# Patient Record
Sex: Female | Born: 1935 | Race: Black or African American | Hispanic: No | Smoking: Never smoker
Health system: Southern US, Community
[De-identification: ages and names within clinical notes are randomized; demographics above are authoritative.]

## PROBLEM LIST (undated history)

## (undated) DIAGNOSIS — I447 Left bundle-branch block, unspecified: Secondary | ICD-10-CM

## (undated) DIAGNOSIS — R0789 Other chest pain: Secondary | ICD-10-CM

## (undated) DIAGNOSIS — G7000 Myasthenia gravis without (acute) exacerbation: Secondary | ICD-10-CM

## (undated) DIAGNOSIS — IMO0001 Reserved for inherently not codable concepts without codable children: Secondary | ICD-10-CM

## (undated) DIAGNOSIS — N289 Disorder of kidney and ureter, unspecified: Secondary | ICD-10-CM

## (undated) DIAGNOSIS — M810 Age-related osteoporosis without current pathological fracture: Secondary | ICD-10-CM

## (undated) DIAGNOSIS — M25471 Effusion, right ankle: Secondary | ICD-10-CM

## (undated) DIAGNOSIS — H409 Unspecified glaucoma: Secondary | ICD-10-CM

## (undated) DIAGNOSIS — R197 Diarrhea, unspecified: Secondary | ICD-10-CM

## (undated) DIAGNOSIS — F419 Anxiety disorder, unspecified: Secondary | ICD-10-CM

## (undated) DIAGNOSIS — K5903 Drug induced constipation: Secondary | ICD-10-CM

## (undated) DIAGNOSIS — F0390 Unspecified dementia without behavioral disturbance: Secondary | ICD-10-CM

## (undated) DIAGNOSIS — I10 Essential (primary) hypertension: Secondary | ICD-10-CM

## (undated) DIAGNOSIS — M1990 Unspecified osteoarthritis, unspecified site: Secondary | ICD-10-CM

## (undated) DIAGNOSIS — I428 Other cardiomyopathies: Secondary | ICD-10-CM

## (undated) DIAGNOSIS — E079 Disorder of thyroid, unspecified: Secondary | ICD-10-CM

## (undated) DIAGNOSIS — R350 Frequency of micturition: Secondary | ICD-10-CM

## (undated) DIAGNOSIS — G459 Transient cerebral ischemic attack, unspecified: Secondary | ICD-10-CM

## (undated) DIAGNOSIS — F329 Major depressive disorder, single episode, unspecified: Secondary | ICD-10-CM

## (undated) DIAGNOSIS — R569 Unspecified convulsions: Secondary | ICD-10-CM

## (undated) DIAGNOSIS — Z5189 Encounter for other specified aftercare: Secondary | ICD-10-CM

## (undated) DIAGNOSIS — F32A Depression, unspecified: Secondary | ICD-10-CM

## (undated) DIAGNOSIS — M889 Osteitis deformans of unspecified bone: Secondary | ICD-10-CM

## (undated) DIAGNOSIS — D649 Anemia, unspecified: Secondary | ICD-10-CM

## (undated) DIAGNOSIS — K219 Gastro-esophageal reflux disease without esophagitis: Secondary | ICD-10-CM

## (undated) DIAGNOSIS — M25472 Effusion, left ankle: Secondary | ICD-10-CM

## (undated) DIAGNOSIS — F29 Unspecified psychosis not due to a substance or known physiological condition: Secondary | ICD-10-CM

## (undated) HISTORY — PX: EYE SURGERY: SHX253

## (undated) HISTORY — PX: JOINT REPLACEMENT: SHX530

## (undated) HISTORY — PX: TONSILLECTOMY: SUR1361

## (undated) HISTORY — DX: Disorder of thyroid, unspecified: E07.9

## (undated) HISTORY — PX: BREAST LUMPECTOMY: SHX2

## (undated) HISTORY — PX: FOOT SURGERY: SHX648

## (undated) HISTORY — PX: TUBAL LIGATION: SHX77

## (undated) HISTORY — DX: Myasthenia gravis without (acute) exacerbation: G70.00

## (undated) HISTORY — PX: SHOULDER SURGERY: SHX246

## (undated) HISTORY — DX: Other chest pain: R07.89

## (undated) HISTORY — PX: CARDIAC CATHETERIZATION: SHX172

## (undated) HISTORY — PX: CHOLECYSTECTOMY: SHX55

## (undated) HISTORY — DX: Age-related osteoporosis without current pathological fracture: M81.0

---

## 1961-02-14 HISTORY — PX: NEPHRECTOMY: SHX65

## 1997-05-19 ENCOUNTER — Encounter: Admission: RE | Admit: 1997-05-19 | Discharge: 1997-05-19 | Payer: Self-pay | Admitting: Family Medicine

## 1997-06-12 ENCOUNTER — Encounter: Admission: RE | Admit: 1997-06-12 | Discharge: 1997-06-12 | Payer: Self-pay | Admitting: Family Medicine

## 1997-07-09 ENCOUNTER — Encounter: Admission: RE | Admit: 1997-07-09 | Discharge: 1997-07-09 | Payer: Self-pay | Admitting: Family Medicine

## 1997-08-29 ENCOUNTER — Encounter: Admission: RE | Admit: 1997-08-29 | Discharge: 1997-08-29 | Payer: Self-pay | Admitting: Family Medicine

## 1997-09-04 ENCOUNTER — Ambulatory Visit (HOSPITAL_COMMUNITY): Admission: RE | Admit: 1997-09-04 | Discharge: 1997-09-04 | Payer: Self-pay | Admitting: Gastroenterology

## 1997-09-25 ENCOUNTER — Encounter: Admission: RE | Admit: 1997-09-25 | Discharge: 1997-09-25 | Payer: Self-pay | Admitting: Family Medicine

## 1997-10-07 ENCOUNTER — Encounter: Admission: RE | Admit: 1997-10-07 | Discharge: 1997-10-07 | Payer: Self-pay | Admitting: Sports Medicine

## 1997-10-23 ENCOUNTER — Inpatient Hospital Stay (HOSPITAL_COMMUNITY): Admission: EM | Admit: 1997-10-23 | Discharge: 1997-10-26 | Payer: Self-pay | Admitting: Emergency Medicine

## 1997-10-23 ENCOUNTER — Encounter (HOSPITAL_BASED_OUTPATIENT_CLINIC_OR_DEPARTMENT_OTHER): Payer: Self-pay | Admitting: General Surgery

## 1997-10-23 ENCOUNTER — Encounter: Payer: Self-pay | Admitting: Emergency Medicine

## 1997-10-24 ENCOUNTER — Encounter (HOSPITAL_BASED_OUTPATIENT_CLINIC_OR_DEPARTMENT_OTHER): Payer: Self-pay | Admitting: General Surgery

## 1997-10-25 ENCOUNTER — Encounter (HOSPITAL_BASED_OUTPATIENT_CLINIC_OR_DEPARTMENT_OTHER): Payer: Self-pay | Admitting: General Surgery

## 1997-11-10 ENCOUNTER — Encounter: Admission: RE | Admit: 1997-11-10 | Discharge: 1997-11-10 | Payer: Self-pay | Admitting: Family Medicine

## 1997-12-10 ENCOUNTER — Encounter: Admission: RE | Admit: 1997-12-10 | Discharge: 1997-12-10 | Payer: Self-pay | Admitting: Family Medicine

## 1998-01-12 ENCOUNTER — Encounter: Admission: RE | Admit: 1998-01-12 | Discharge: 1998-01-12 | Payer: Self-pay | Admitting: Family Medicine

## 1998-02-12 ENCOUNTER — Encounter: Admission: RE | Admit: 1998-02-12 | Discharge: 1998-02-12 | Payer: Self-pay | Admitting: Family Medicine

## 1998-02-16 ENCOUNTER — Encounter: Admission: RE | Admit: 1998-02-16 | Discharge: 1998-02-16 | Payer: Self-pay | Admitting: Family Medicine

## 1998-03-12 ENCOUNTER — Encounter: Payer: Self-pay | Admitting: Orthopedic Surgery

## 1998-03-16 ENCOUNTER — Inpatient Hospital Stay (HOSPITAL_COMMUNITY): Admission: RE | Admit: 1998-03-16 | Discharge: 1998-03-19 | Payer: Self-pay | Admitting: Orthopedic Surgery

## 1998-03-19 ENCOUNTER — Inpatient Hospital Stay (HOSPITAL_COMMUNITY)
Admission: RE | Admit: 1998-03-19 | Discharge: 1998-03-27 | Payer: Self-pay | Admitting: Physical Medicine and Rehabilitation

## 1998-03-21 ENCOUNTER — Encounter: Payer: Self-pay | Admitting: Physical Medicine and Rehabilitation

## 1998-04-03 ENCOUNTER — Encounter: Admission: RE | Admit: 1998-04-03 | Discharge: 1998-04-03 | Payer: Self-pay | Admitting: Family Medicine

## 1998-04-28 ENCOUNTER — Encounter: Admission: RE | Admit: 1998-04-28 | Discharge: 1998-05-28 | Payer: Self-pay | Admitting: Orthopedic Surgery

## 1998-05-22 ENCOUNTER — Encounter: Admission: RE | Admit: 1998-05-22 | Discharge: 1998-05-22 | Payer: Self-pay | Admitting: Family Medicine

## 1998-06-02 ENCOUNTER — Encounter: Admission: RE | Admit: 1998-06-02 | Discharge: 1998-06-02 | Payer: Self-pay | Admitting: Family Medicine

## 1998-06-12 ENCOUNTER — Encounter: Admission: RE | Admit: 1998-06-12 | Discharge: 1998-06-12 | Payer: Self-pay | Admitting: Family Medicine

## 1998-07-29 ENCOUNTER — Encounter: Admission: RE | Admit: 1998-07-29 | Discharge: 1998-07-29 | Payer: Self-pay | Admitting: Family Medicine

## 1998-07-30 ENCOUNTER — Encounter: Admission: RE | Admit: 1998-07-30 | Discharge: 1998-08-11 | Payer: Self-pay

## 1998-08-21 ENCOUNTER — Encounter: Admission: RE | Admit: 1998-08-21 | Discharge: 1998-08-21 | Payer: Self-pay | Admitting: Family Medicine

## 1998-10-09 ENCOUNTER — Encounter: Admission: RE | Admit: 1998-10-09 | Discharge: 1998-10-09 | Payer: Self-pay | Admitting: Family Medicine

## 1998-10-21 ENCOUNTER — Encounter: Admission: RE | Admit: 1998-10-21 | Discharge: 1998-10-21 | Payer: Self-pay | Admitting: Family Medicine

## 1998-10-21 ENCOUNTER — Inpatient Hospital Stay (HOSPITAL_COMMUNITY): Admission: EM | Admit: 1998-10-21 | Discharge: 1998-10-22 | Payer: Self-pay | Admitting: Emergency Medicine

## 1998-10-21 ENCOUNTER — Ambulatory Visit (HOSPITAL_COMMUNITY): Admission: RE | Admit: 1998-10-21 | Discharge: 1998-10-21 | Payer: Self-pay | Admitting: Family Medicine

## 1998-10-23 ENCOUNTER — Encounter: Admission: RE | Admit: 1998-10-23 | Discharge: 1998-10-23 | Payer: Self-pay | Admitting: Family Medicine

## 1998-11-11 ENCOUNTER — Encounter: Admission: RE | Admit: 1998-11-11 | Discharge: 1998-11-11 | Payer: Self-pay | Admitting: Family Medicine

## 1998-11-23 ENCOUNTER — Encounter: Admission: RE | Admit: 1998-11-23 | Discharge: 1998-11-23 | Payer: Self-pay | Admitting: Family Medicine

## 1998-12-11 ENCOUNTER — Encounter: Admission: RE | Admit: 1998-12-11 | Discharge: 1998-12-11 | Payer: Self-pay | Admitting: Sports Medicine

## 1999-01-06 ENCOUNTER — Encounter: Admission: RE | Admit: 1999-01-06 | Discharge: 1999-01-06 | Payer: Self-pay | Admitting: Family Medicine

## 1999-02-05 ENCOUNTER — Encounter: Admission: RE | Admit: 1999-02-05 | Discharge: 1999-02-05 | Payer: Self-pay | Admitting: Sports Medicine

## 1999-02-25 ENCOUNTER — Encounter: Admission: RE | Admit: 1999-02-25 | Discharge: 1999-02-25 | Payer: Self-pay | Admitting: Family Medicine

## 1999-03-03 ENCOUNTER — Encounter: Payer: Self-pay | Admitting: Sports Medicine

## 1999-03-03 ENCOUNTER — Encounter: Admission: RE | Admit: 1999-03-03 | Discharge: 1999-03-03 | Payer: Self-pay | Admitting: Sports Medicine

## 1999-03-11 ENCOUNTER — Encounter: Admission: RE | Admit: 1999-03-11 | Discharge: 1999-03-11 | Payer: Self-pay | Admitting: Family Medicine

## 1999-03-11 ENCOUNTER — Other Ambulatory Visit: Admission: RE | Admit: 1999-03-11 | Discharge: 1999-03-14 | Payer: Self-pay | Admitting: *Deleted

## 1999-03-30 ENCOUNTER — Encounter: Admission: RE | Admit: 1999-03-30 | Discharge: 1999-03-30 | Payer: Self-pay | Admitting: Sports Medicine

## 1999-04-13 ENCOUNTER — Encounter: Admission: RE | Admit: 1999-04-13 | Discharge: 1999-04-13 | Payer: Self-pay | Admitting: Sports Medicine

## 1999-04-19 ENCOUNTER — Encounter: Admission: RE | Admit: 1999-04-19 | Discharge: 1999-04-19 | Payer: Self-pay | Admitting: Family Medicine

## 1999-04-23 ENCOUNTER — Encounter: Admission: RE | Admit: 1999-04-23 | Discharge: 1999-07-22 | Payer: Self-pay | Admitting: Sports Medicine

## 1999-04-26 ENCOUNTER — Encounter: Admission: RE | Admit: 1999-04-26 | Discharge: 1999-04-26 | Payer: Self-pay | Admitting: Sports Medicine

## 1999-04-26 ENCOUNTER — Encounter: Payer: Self-pay | Admitting: Sports Medicine

## 1999-05-03 ENCOUNTER — Encounter: Admission: RE | Admit: 1999-05-03 | Discharge: 1999-05-03 | Payer: Self-pay | Admitting: Family Medicine

## 1999-05-11 ENCOUNTER — Encounter: Admission: RE | Admit: 1999-05-11 | Discharge: 1999-08-09 | Payer: Self-pay | Admitting: *Deleted

## 1999-06-14 ENCOUNTER — Encounter: Admission: RE | Admit: 1999-06-14 | Discharge: 1999-06-14 | Payer: Self-pay | Admitting: Family Medicine

## 1999-07-01 ENCOUNTER — Encounter: Admission: RE | Admit: 1999-07-01 | Discharge: 1999-07-01 | Payer: Self-pay | Admitting: Family Medicine

## 1999-07-01 ENCOUNTER — Encounter: Admission: RE | Admit: 1999-07-01 | Discharge: 1999-07-01 | Payer: Self-pay | Admitting: *Deleted

## 1999-07-07 ENCOUNTER — Encounter: Admission: RE | Admit: 1999-07-07 | Discharge: 1999-07-07 | Payer: Self-pay | Admitting: Family Medicine

## 1999-07-14 ENCOUNTER — Encounter: Admission: RE | Admit: 1999-07-14 | Discharge: 1999-07-14 | Payer: Self-pay | Admitting: Family Medicine

## 1999-08-13 ENCOUNTER — Encounter: Admission: RE | Admit: 1999-08-13 | Discharge: 1999-11-11 | Payer: Self-pay | Admitting: *Deleted

## 1999-08-15 ENCOUNTER — Encounter (INDEPENDENT_AMBULATORY_CARE_PROVIDER_SITE_OTHER): Payer: Self-pay | Admitting: *Deleted

## 1999-08-16 ENCOUNTER — Other Ambulatory Visit: Admission: RE | Admit: 1999-08-16 | Discharge: 1999-08-16 | Payer: Self-pay | Admitting: Sports Medicine

## 1999-08-16 ENCOUNTER — Encounter: Admission: RE | Admit: 1999-08-16 | Discharge: 1999-08-16 | Payer: Self-pay | Admitting: Family Medicine

## 1999-09-15 ENCOUNTER — Encounter: Admission: RE | Admit: 1999-09-15 | Discharge: 1999-09-15 | Payer: Self-pay | Admitting: Family Medicine

## 1999-09-29 ENCOUNTER — Encounter: Admission: RE | Admit: 1999-09-29 | Discharge: 1999-09-29 | Payer: Self-pay | Admitting: Family Medicine

## 1999-09-29 ENCOUNTER — Ambulatory Visit (HOSPITAL_COMMUNITY): Admission: RE | Admit: 1999-09-29 | Discharge: 1999-09-29 | Payer: Self-pay | Admitting: Family Medicine

## 1999-10-01 ENCOUNTER — Ambulatory Visit (HOSPITAL_COMMUNITY): Admission: RE | Admit: 1999-10-01 | Discharge: 1999-10-01 | Payer: Self-pay | Admitting: Family Medicine

## 1999-10-05 ENCOUNTER — Encounter: Admission: RE | Admit: 1999-10-05 | Discharge: 1999-10-05 | Payer: Self-pay | Admitting: Sports Medicine

## 1999-10-12 ENCOUNTER — Encounter: Admission: RE | Admit: 1999-10-12 | Discharge: 1999-10-12 | Payer: Self-pay | Admitting: Sports Medicine

## 1999-11-03 ENCOUNTER — Encounter: Admission: RE | Admit: 1999-11-03 | Discharge: 1999-11-03 | Payer: Self-pay | Admitting: Family Medicine

## 1999-12-24 ENCOUNTER — Encounter: Admission: RE | Admit: 1999-12-24 | Discharge: 1999-12-24 | Payer: Self-pay | Admitting: Family Medicine

## 2000-01-24 ENCOUNTER — Encounter: Admission: RE | Admit: 2000-01-24 | Discharge: 2000-01-24 | Payer: Self-pay | Admitting: Family Medicine

## 2000-02-24 ENCOUNTER — Encounter: Admission: RE | Admit: 2000-02-24 | Discharge: 2000-02-24 | Payer: Self-pay | Admitting: Family Medicine

## 2000-03-06 ENCOUNTER — Encounter: Payer: Self-pay | Admitting: Sports Medicine

## 2000-03-06 ENCOUNTER — Encounter: Admission: RE | Admit: 2000-03-06 | Discharge: 2000-03-06 | Payer: Self-pay | Admitting: Sports Medicine

## 2000-03-29 ENCOUNTER — Encounter: Admission: RE | Admit: 2000-03-29 | Discharge: 2000-03-29 | Payer: Self-pay | Admitting: Family Medicine

## 2000-04-28 ENCOUNTER — Encounter: Admission: RE | Admit: 2000-04-28 | Discharge: 2000-04-28 | Payer: Self-pay | Admitting: Family Medicine

## 2000-04-29 ENCOUNTER — Encounter: Payer: Self-pay | Admitting: Emergency Medicine

## 2000-04-29 ENCOUNTER — Emergency Department (HOSPITAL_COMMUNITY): Admission: EM | Admit: 2000-04-29 | Discharge: 2000-04-29 | Payer: Self-pay | Admitting: Emergency Medicine

## 2000-05-26 ENCOUNTER — Encounter: Admission: RE | Admit: 2000-05-26 | Discharge: 2000-05-26 | Payer: Self-pay | Admitting: Family Medicine

## 2000-06-27 ENCOUNTER — Encounter: Admission: RE | Admit: 2000-06-27 | Discharge: 2000-06-27 | Payer: Self-pay | Admitting: Family Medicine

## 2000-07-13 ENCOUNTER — Encounter: Admission: RE | Admit: 2000-07-13 | Discharge: 2000-07-13 | Payer: Self-pay | Admitting: Family Medicine

## 2000-07-28 ENCOUNTER — Encounter: Admission: RE | Admit: 2000-07-28 | Discharge: 2000-07-28 | Payer: Self-pay | Admitting: Family Medicine

## 2000-09-01 ENCOUNTER — Encounter: Admission: RE | Admit: 2000-09-01 | Discharge: 2000-09-01 | Payer: Self-pay | Admitting: Family Medicine

## 2000-10-04 ENCOUNTER — Encounter: Admission: RE | Admit: 2000-10-04 | Discharge: 2000-10-04 | Payer: Self-pay | Admitting: Family Medicine

## 2000-11-06 ENCOUNTER — Encounter: Admission: RE | Admit: 2000-11-06 | Discharge: 2000-11-06 | Payer: Self-pay | Admitting: Family Medicine

## 2000-12-05 ENCOUNTER — Encounter: Admission: RE | Admit: 2000-12-05 | Discharge: 2000-12-05 | Payer: Self-pay | Admitting: Family Medicine

## 2001-01-10 ENCOUNTER — Encounter: Admission: RE | Admit: 2001-01-10 | Discharge: 2001-01-10 | Payer: Self-pay | Admitting: Family Medicine

## 2001-02-01 ENCOUNTER — Encounter: Admission: RE | Admit: 2001-02-01 | Discharge: 2001-02-01 | Payer: Self-pay | Admitting: Family Medicine

## 2001-02-02 ENCOUNTER — Encounter: Admission: RE | Admit: 2001-02-02 | Discharge: 2001-02-02 | Payer: Self-pay | Admitting: Family Medicine

## 2001-03-07 ENCOUNTER — Encounter: Payer: Self-pay | Admitting: Sports Medicine

## 2001-03-07 ENCOUNTER — Encounter: Admission: RE | Admit: 2001-03-07 | Discharge: 2001-03-07 | Payer: Self-pay | Admitting: Sports Medicine

## 2001-03-13 ENCOUNTER — Encounter: Admission: RE | Admit: 2001-03-13 | Discharge: 2001-03-13 | Payer: Self-pay | Admitting: Family Medicine

## 2001-04-09 ENCOUNTER — Encounter: Admission: RE | Admit: 2001-04-09 | Discharge: 2001-04-09 | Payer: Self-pay | Admitting: Family Medicine

## 2001-05-10 ENCOUNTER — Encounter: Admission: RE | Admit: 2001-05-10 | Discharge: 2001-05-10 | Payer: Self-pay | Admitting: Family Medicine

## 2001-06-08 ENCOUNTER — Encounter: Admission: RE | Admit: 2001-06-08 | Discharge: 2001-06-08 | Payer: Self-pay | Admitting: Family Medicine

## 2001-06-08 ENCOUNTER — Other Ambulatory Visit: Admission: RE | Admit: 2001-06-08 | Discharge: 2001-06-08 | Payer: Self-pay | Admitting: Family Medicine

## 2001-07-27 ENCOUNTER — Encounter: Admission: RE | Admit: 2001-07-27 | Discharge: 2001-07-27 | Payer: Self-pay | Admitting: Family Medicine

## 2001-08-23 ENCOUNTER — Encounter: Admission: RE | Admit: 2001-08-23 | Discharge: 2001-08-23 | Payer: Self-pay | Admitting: Family Medicine

## 2001-09-26 ENCOUNTER — Encounter: Admission: RE | Admit: 2001-09-26 | Discharge: 2001-09-26 | Payer: Self-pay | Admitting: Family Medicine

## 2001-11-14 ENCOUNTER — Encounter: Admission: RE | Admit: 2001-11-14 | Discharge: 2001-11-14 | Payer: Self-pay | Admitting: Family Medicine

## 2001-12-19 ENCOUNTER — Encounter: Admission: RE | Admit: 2001-12-19 | Discharge: 2001-12-19 | Payer: Self-pay | Admitting: Family Medicine

## 2001-12-27 ENCOUNTER — Encounter: Admission: RE | Admit: 2001-12-27 | Discharge: 2001-12-27 | Payer: Self-pay

## 2001-12-27 ENCOUNTER — Encounter: Payer: Self-pay | Admitting: Sports Medicine

## 2002-01-03 ENCOUNTER — Encounter: Admission: RE | Admit: 2002-01-03 | Discharge: 2002-01-03 | Payer: Self-pay | Admitting: Family Medicine

## 2002-01-14 ENCOUNTER — Encounter: Admission: RE | Admit: 2002-01-14 | Discharge: 2002-02-28 | Payer: Self-pay | Admitting: Sports Medicine

## 2002-01-28 ENCOUNTER — Encounter: Admission: RE | Admit: 2002-01-28 | Discharge: 2002-01-28 | Payer: Self-pay | Admitting: Family Medicine

## 2002-01-31 ENCOUNTER — Encounter: Admission: RE | Admit: 2002-01-31 | Discharge: 2002-01-31 | Payer: Self-pay | Admitting: Family Medicine

## 2002-02-27 ENCOUNTER — Encounter: Admission: RE | Admit: 2002-02-27 | Discharge: 2002-02-27 | Payer: Self-pay | Admitting: Family Medicine

## 2002-03-19 ENCOUNTER — Encounter: Admission: RE | Admit: 2002-03-19 | Discharge: 2002-03-19 | Payer: Self-pay | Admitting: Family Medicine

## 2002-03-19 ENCOUNTER — Ambulatory Visit (HOSPITAL_COMMUNITY): Admission: RE | Admit: 2002-03-19 | Discharge: 2002-03-19 | Payer: Self-pay | Admitting: Family Medicine

## 2002-03-27 ENCOUNTER — Encounter: Admission: RE | Admit: 2002-03-27 | Discharge: 2002-03-27 | Payer: Self-pay | Admitting: Family Medicine

## 2002-04-03 ENCOUNTER — Encounter: Admission: RE | Admit: 2002-04-03 | Discharge: 2002-04-03 | Payer: Self-pay | Admitting: Sports Medicine

## 2002-04-03 ENCOUNTER — Encounter: Payer: Self-pay | Admitting: Sports Medicine

## 2002-04-09 ENCOUNTER — Encounter: Admission: RE | Admit: 2002-04-09 | Discharge: 2002-04-09 | Payer: Self-pay | Admitting: Family Medicine

## 2002-04-29 ENCOUNTER — Ambulatory Visit (HOSPITAL_COMMUNITY): Admission: RE | Admit: 2002-04-29 | Discharge: 2002-04-29 | Payer: Self-pay | Admitting: Family Medicine

## 2002-04-29 ENCOUNTER — Encounter: Admission: RE | Admit: 2002-04-29 | Discharge: 2002-04-29 | Payer: Self-pay | Admitting: Family Medicine

## 2002-05-08 ENCOUNTER — Encounter: Admission: RE | Admit: 2002-05-08 | Discharge: 2002-05-08 | Payer: Self-pay | Admitting: Family Medicine

## 2002-05-15 ENCOUNTER — Encounter: Payer: Self-pay | Admitting: Cardiovascular Disease

## 2002-05-15 ENCOUNTER — Ambulatory Visit (HOSPITAL_COMMUNITY): Admission: RE | Admit: 2002-05-15 | Discharge: 2002-05-15 | Payer: Self-pay | Admitting: Cardiovascular Disease

## 2002-05-20 ENCOUNTER — Encounter: Admission: RE | Admit: 2002-05-20 | Discharge: 2002-05-20 | Payer: Self-pay | Admitting: Family Medicine

## 2002-06-10 ENCOUNTER — Ambulatory Visit (HOSPITAL_COMMUNITY): Admission: RE | Admit: 2002-06-10 | Discharge: 2002-06-10 | Payer: Self-pay | Admitting: *Deleted

## 2002-06-10 ENCOUNTER — Encounter: Admission: RE | Admit: 2002-06-10 | Discharge: 2002-06-10 | Payer: Self-pay | Admitting: Family Medicine

## 2002-06-12 ENCOUNTER — Emergency Department (HOSPITAL_COMMUNITY): Admission: EM | Admit: 2002-06-12 | Discharge: 2002-06-12 | Payer: Self-pay | Admitting: Nurse Practitioner

## 2002-06-24 ENCOUNTER — Encounter: Admission: RE | Admit: 2002-06-24 | Discharge: 2002-06-24 | Payer: Self-pay | Admitting: Family Medicine

## 2002-07-22 ENCOUNTER — Encounter: Admission: RE | Admit: 2002-07-22 | Discharge: 2002-07-22 | Payer: Self-pay | Admitting: Sports Medicine

## 2002-08-15 ENCOUNTER — Emergency Department (HOSPITAL_COMMUNITY): Admission: EM | Admit: 2002-08-15 | Discharge: 2002-08-15 | Payer: Self-pay | Admitting: Emergency Medicine

## 2002-08-16 ENCOUNTER — Encounter: Admission: RE | Admit: 2002-08-16 | Discharge: 2002-08-16 | Payer: Self-pay | Admitting: Family Medicine

## 2002-08-21 ENCOUNTER — Encounter: Admission: RE | Admit: 2002-08-21 | Discharge: 2002-08-21 | Payer: Self-pay | Admitting: Family Medicine

## 2002-08-21 ENCOUNTER — Encounter: Admission: RE | Admit: 2002-08-21 | Discharge: 2002-08-21 | Payer: Self-pay | Admitting: Sports Medicine

## 2002-08-21 ENCOUNTER — Encounter: Payer: Self-pay | Admitting: Sports Medicine

## 2002-09-19 ENCOUNTER — Encounter (INDEPENDENT_AMBULATORY_CARE_PROVIDER_SITE_OTHER): Payer: Self-pay | Admitting: Specialist

## 2002-09-19 ENCOUNTER — Ambulatory Visit (HOSPITAL_COMMUNITY): Admission: RE | Admit: 2002-09-19 | Discharge: 2002-09-19 | Payer: Self-pay | Admitting: Gastroenterology

## 2002-10-08 ENCOUNTER — Encounter: Admission: RE | Admit: 2002-10-08 | Discharge: 2002-10-08 | Payer: Self-pay | Admitting: Sports Medicine

## 2002-10-11 ENCOUNTER — Encounter: Admission: RE | Admit: 2002-10-11 | Discharge: 2002-10-11 | Payer: Self-pay | Admitting: Sports Medicine

## 2002-10-11 ENCOUNTER — Encounter: Payer: Self-pay | Admitting: Sports Medicine

## 2002-10-24 ENCOUNTER — Encounter: Admission: RE | Admit: 2002-10-24 | Discharge: 2002-10-24 | Payer: Self-pay | Admitting: Sports Medicine

## 2002-11-08 ENCOUNTER — Encounter: Admission: RE | Admit: 2002-11-08 | Discharge: 2002-11-08 | Payer: Self-pay | Admitting: Family Medicine

## 2002-11-25 ENCOUNTER — Encounter: Admission: RE | Admit: 2002-11-25 | Discharge: 2002-11-25 | Payer: Self-pay | Admitting: Sports Medicine

## 2002-11-25 ENCOUNTER — Encounter: Payer: Self-pay | Admitting: Sports Medicine

## 2002-11-25 ENCOUNTER — Encounter: Admission: RE | Admit: 2002-11-25 | Discharge: 2002-11-25 | Payer: Self-pay | Admitting: Family Medicine

## 2002-11-27 ENCOUNTER — Encounter: Admission: RE | Admit: 2002-11-27 | Discharge: 2002-11-27 | Payer: Self-pay | Admitting: Family Medicine

## 2002-12-03 ENCOUNTER — Encounter: Admission: RE | Admit: 2002-12-03 | Discharge: 2002-12-20 | Payer: Self-pay

## 2002-12-25 ENCOUNTER — Encounter: Admission: RE | Admit: 2002-12-25 | Discharge: 2002-12-25 | Payer: Self-pay | Admitting: Family Medicine

## 2003-01-24 ENCOUNTER — Encounter: Admission: RE | Admit: 2003-01-24 | Discharge: 2003-01-24 | Payer: Self-pay | Admitting: Sports Medicine

## 2003-01-27 ENCOUNTER — Ambulatory Visit (HOSPITAL_COMMUNITY): Admission: RE | Admit: 2003-01-27 | Discharge: 2003-01-27 | Payer: Self-pay | Admitting: Orthopedic Surgery

## 2003-03-04 ENCOUNTER — Encounter: Admission: RE | Admit: 2003-03-04 | Discharge: 2003-03-04 | Payer: Self-pay | Admitting: Family Medicine

## 2003-03-06 ENCOUNTER — Encounter: Admission: RE | Admit: 2003-03-06 | Discharge: 2003-03-06 | Payer: Self-pay | Admitting: Sports Medicine

## 2003-03-24 ENCOUNTER — Encounter: Admission: RE | Admit: 2003-03-24 | Discharge: 2003-03-24 | Payer: Self-pay | Admitting: Family Medicine

## 2003-04-23 ENCOUNTER — Encounter: Admission: RE | Admit: 2003-04-23 | Discharge: 2003-04-23 | Payer: Self-pay | Admitting: Family Medicine

## 2003-05-02 ENCOUNTER — Encounter: Admission: RE | Admit: 2003-05-02 | Discharge: 2003-05-02 | Payer: Self-pay | Admitting: Gastroenterology

## 2003-05-06 ENCOUNTER — Encounter: Admission: RE | Admit: 2003-05-06 | Discharge: 2003-05-06 | Payer: Self-pay | Admitting: Internal Medicine

## 2003-06-23 ENCOUNTER — Encounter: Admission: RE | Admit: 2003-06-23 | Discharge: 2003-06-23 | Payer: Self-pay | Admitting: Family Medicine

## 2003-07-10 ENCOUNTER — Encounter: Admission: RE | Admit: 2003-07-10 | Discharge: 2003-08-08 | Payer: Self-pay | Admitting: Sports Medicine

## 2003-08-27 ENCOUNTER — Encounter: Admission: RE | Admit: 2003-08-27 | Discharge: 2003-08-27 | Payer: Self-pay | Admitting: Family Medicine

## 2003-08-27 ENCOUNTER — Encounter: Admission: RE | Admit: 2003-08-27 | Discharge: 2003-08-27 | Payer: Self-pay | Admitting: Sports Medicine

## 2003-08-29 ENCOUNTER — Encounter: Admission: RE | Admit: 2003-08-29 | Discharge: 2003-08-29 | Payer: Self-pay | Admitting: Sports Medicine

## 2003-09-08 ENCOUNTER — Encounter: Admission: RE | Admit: 2003-09-08 | Discharge: 2003-10-07 | Payer: Self-pay | Admitting: Sports Medicine

## 2003-09-26 ENCOUNTER — Encounter: Admission: RE | Admit: 2003-09-26 | Discharge: 2003-09-26 | Payer: Self-pay | Admitting: Family Medicine

## 2003-10-16 ENCOUNTER — Ambulatory Visit: Payer: Self-pay | Admitting: Sports Medicine

## 2003-10-27 ENCOUNTER — Ambulatory Visit: Payer: Self-pay | Admitting: Family Medicine

## 2003-12-18 ENCOUNTER — Ambulatory Visit: Payer: Self-pay | Admitting: Family Medicine

## 2003-12-24 ENCOUNTER — Encounter: Admission: RE | Admit: 2003-12-24 | Discharge: 2003-12-24 | Payer: Self-pay | Admitting: Sports Medicine

## 2003-12-29 ENCOUNTER — Encounter: Admission: RE | Admit: 2003-12-29 | Discharge: 2004-02-18 | Payer: Self-pay | Admitting: Sports Medicine

## 2004-02-03 ENCOUNTER — Ambulatory Visit: Payer: Self-pay | Admitting: Family Medicine

## 2004-02-06 ENCOUNTER — Emergency Department (HOSPITAL_COMMUNITY): Admission: EM | Admit: 2004-02-06 | Discharge: 2004-02-06 | Payer: Self-pay | Admitting: Emergency Medicine

## 2004-02-07 ENCOUNTER — Inpatient Hospital Stay (HOSPITAL_COMMUNITY): Admission: EM | Admit: 2004-02-07 | Discharge: 2004-02-10 | Payer: Self-pay | Admitting: Emergency Medicine

## 2004-02-07 ENCOUNTER — Ambulatory Visit: Payer: Self-pay | Admitting: Family Medicine

## 2004-02-23 ENCOUNTER — Encounter: Admission: RE | Admit: 2004-02-23 | Discharge: 2004-02-23 | Payer: Self-pay | Admitting: Sports Medicine

## 2004-02-26 ENCOUNTER — Ambulatory Visit: Payer: Self-pay | Admitting: Family Medicine

## 2004-03-10 ENCOUNTER — Encounter: Admission: RE | Admit: 2004-03-10 | Discharge: 2004-04-29 | Payer: Self-pay | Admitting: *Deleted

## 2004-04-07 ENCOUNTER — Ambulatory Visit: Payer: Self-pay | Admitting: Family Medicine

## 2004-05-03 ENCOUNTER — Ambulatory Visit: Payer: Self-pay | Admitting: Family Medicine

## 2004-05-07 ENCOUNTER — Encounter: Admission: RE | Admit: 2004-05-07 | Discharge: 2004-05-07 | Payer: Self-pay | Admitting: Sports Medicine

## 2004-05-10 ENCOUNTER — Encounter: Admission: RE | Admit: 2004-05-10 | Discharge: 2004-05-10 | Payer: Self-pay | Admitting: Sports Medicine

## 2004-06-04 ENCOUNTER — Ambulatory Visit: Payer: Self-pay | Admitting: Family Medicine

## 2004-06-09 ENCOUNTER — Inpatient Hospital Stay (HOSPITAL_COMMUNITY): Admission: EM | Admit: 2004-06-09 | Discharge: 2004-06-11 | Payer: Self-pay | Admitting: Emergency Medicine

## 2004-07-06 ENCOUNTER — Ambulatory Visit: Payer: Self-pay | Admitting: Family Medicine

## 2004-07-30 ENCOUNTER — Ambulatory Visit: Payer: Self-pay | Admitting: Family Medicine

## 2004-08-03 ENCOUNTER — Encounter: Admission: RE | Admit: 2004-08-03 | Discharge: 2004-08-03 | Payer: Self-pay | Admitting: Sports Medicine

## 2004-08-12 ENCOUNTER — Ambulatory Visit: Payer: Self-pay | Admitting: Sports Medicine

## 2004-08-26 ENCOUNTER — Ambulatory Visit: Payer: Self-pay | Admitting: Family Medicine

## 2004-09-10 ENCOUNTER — Ambulatory Visit: Payer: Self-pay | Admitting: Family Medicine

## 2004-09-15 ENCOUNTER — Ambulatory Visit: Payer: Self-pay | Admitting: Family Medicine

## 2004-09-16 ENCOUNTER — Ambulatory Visit: Payer: Self-pay | Admitting: Physical Medicine & Rehabilitation

## 2004-09-16 ENCOUNTER — Inpatient Hospital Stay (HOSPITAL_COMMUNITY): Admission: EM | Admit: 2004-09-16 | Discharge: 2004-09-21 | Payer: Self-pay | Admitting: Emergency Medicine

## 2004-09-30 ENCOUNTER — Ambulatory Visit (HOSPITAL_COMMUNITY): Admission: RE | Admit: 2004-09-30 | Discharge: 2004-09-30 | Payer: Self-pay | Admitting: Internal Medicine

## 2004-11-02 ENCOUNTER — Encounter: Admission: RE | Admit: 2004-11-02 | Discharge: 2004-11-02 | Payer: Self-pay | Admitting: *Deleted

## 2004-11-06 ENCOUNTER — Emergency Department (HOSPITAL_COMMUNITY): Admission: EM | Admit: 2004-11-06 | Discharge: 2004-11-07 | Payer: Self-pay | Admitting: Emergency Medicine

## 2004-11-08 ENCOUNTER — Ambulatory Visit (HOSPITAL_COMMUNITY): Admission: RE | Admit: 2004-11-08 | Discharge: 2004-11-08 | Payer: Self-pay | Admitting: Internal Medicine

## 2005-04-26 ENCOUNTER — Encounter: Admission: RE | Admit: 2005-04-26 | Discharge: 2005-04-26 | Payer: Self-pay | Admitting: *Deleted

## 2005-07-13 ENCOUNTER — Encounter: Admission: RE | Admit: 2005-07-13 | Discharge: 2005-08-11 | Payer: Self-pay | Admitting: *Deleted

## 2005-12-22 IMAGING — CR DG CERVICAL SPINE 2 OR 3 VIEWS
3 series · 3 of 3 positions shown · non-contrast
Comparison: None

CLINICAL DATA: Neck pain

CERVICAL SPINE - 2-3 VIEW

[view not recorded (1 of 3)]
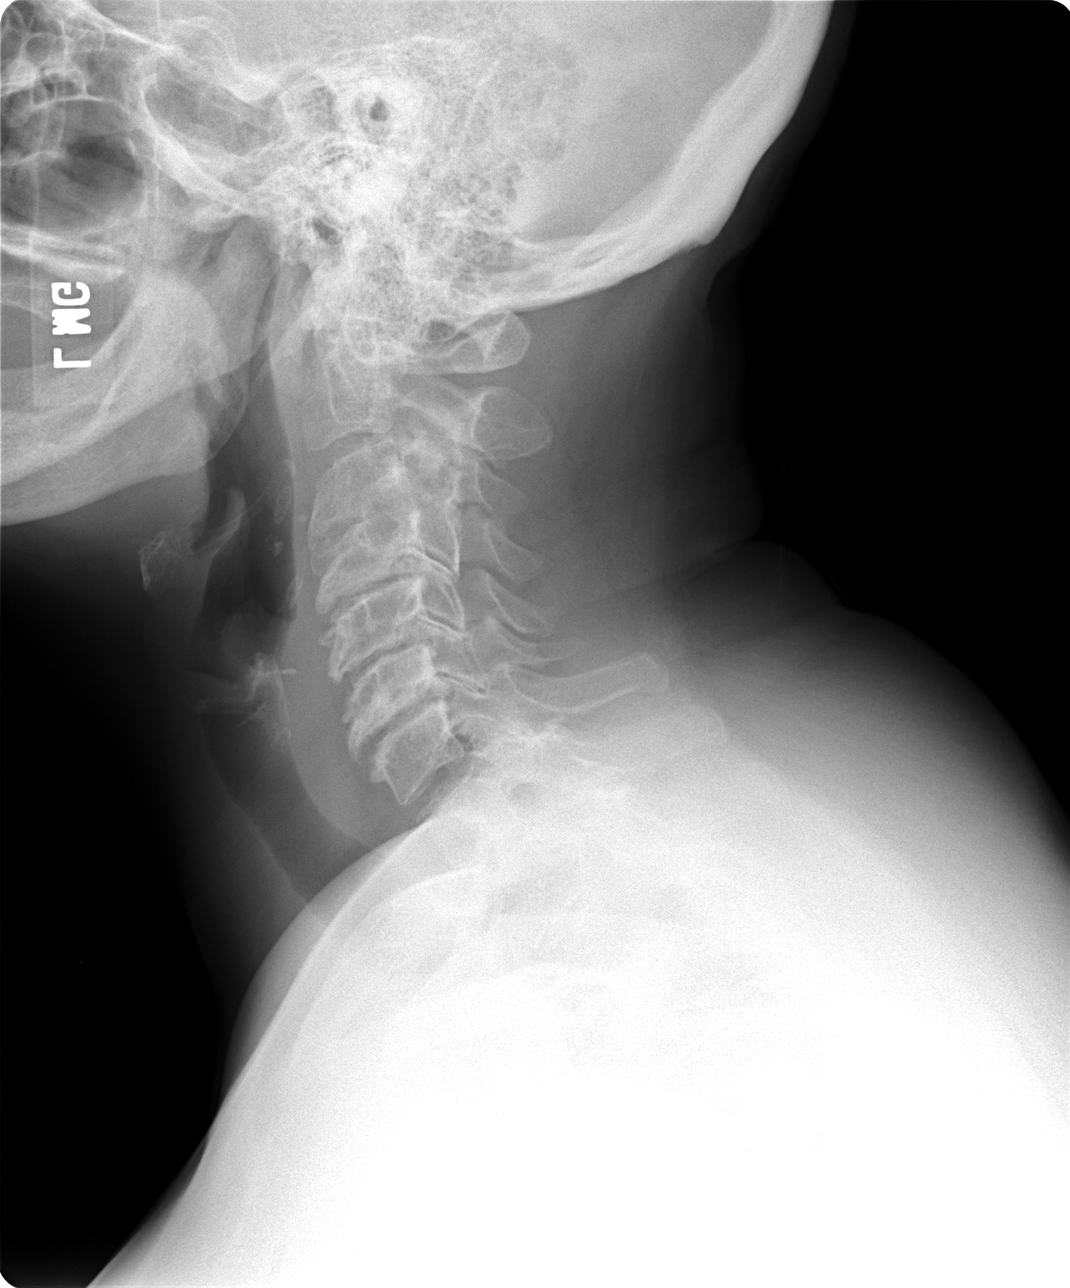

[view not recorded (2 of 3)]
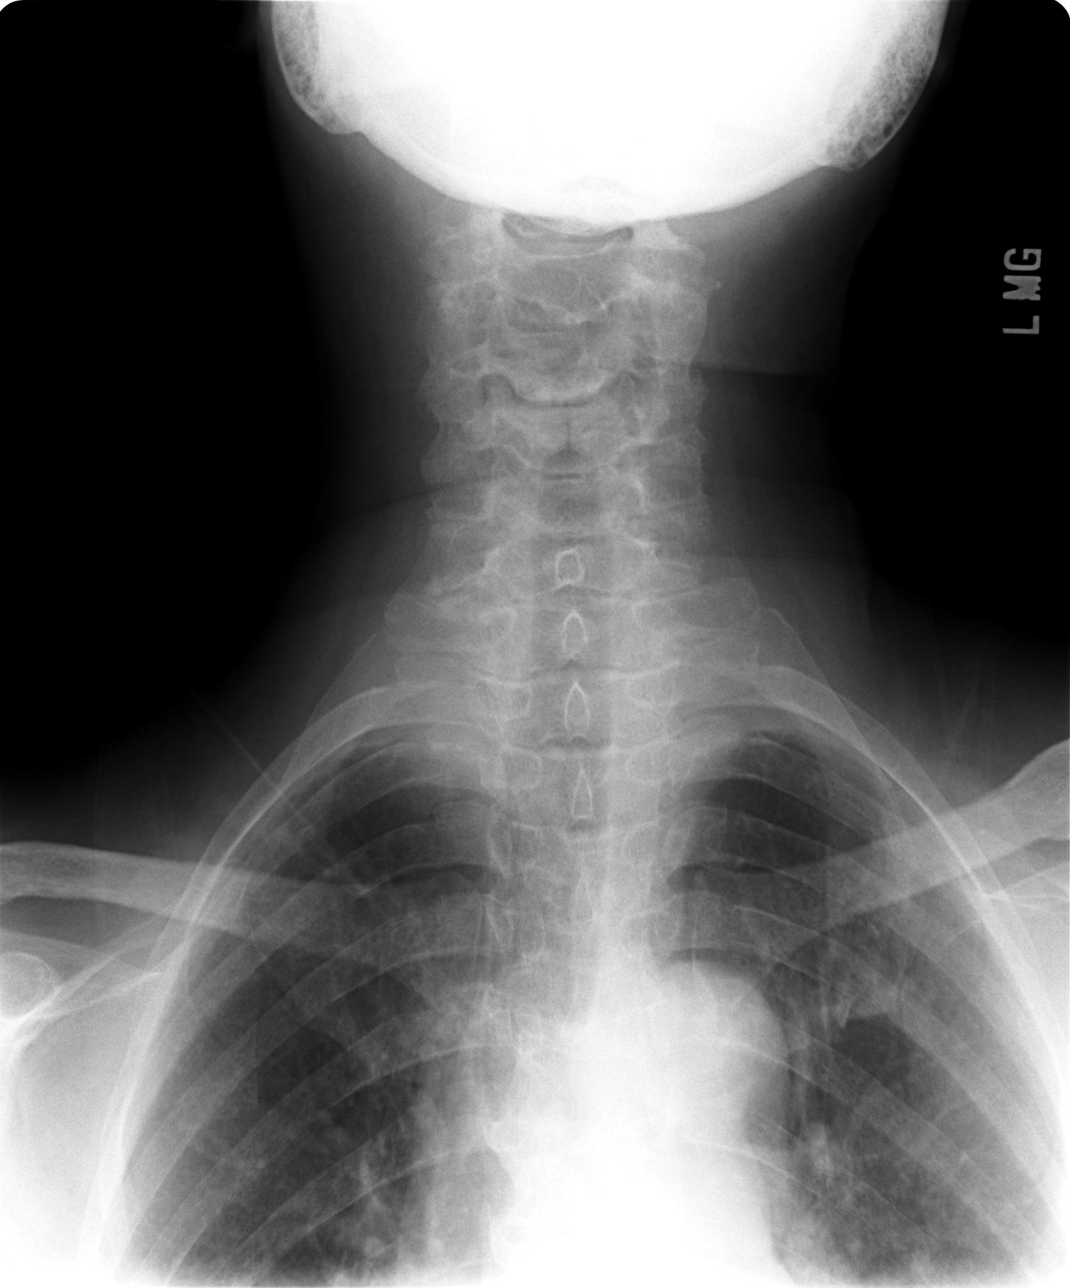

[view not recorded (3 of 3)]
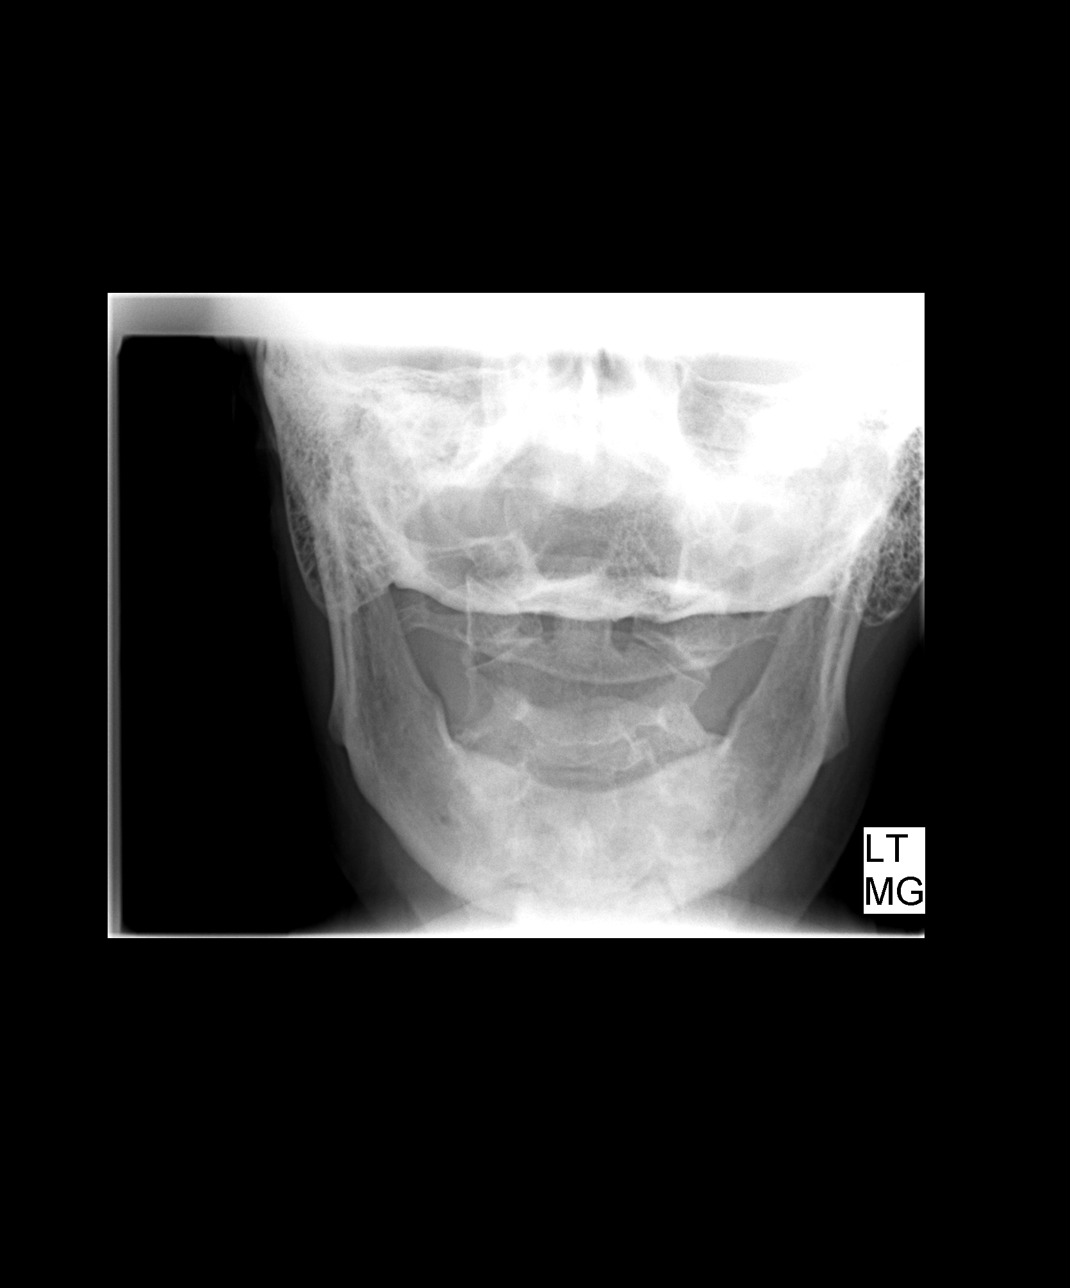

[3 of 3 positions shown; findings below may reference images not displayed]

FINDINGS: Marked spondylosis noted throughout the cervical spine from C3-C4 to
C6-C7 with large osteophyte formation and significant disc space narrowing.
Moderate facet disease present as well. No acute bony abnormality. Specifically
no definite fracture or malalignment. Prevertebral soft tissues are normal.

IMPRESSION

Marked cervical spondylosis. MRI may be beneficial to evaluate for spinal or
neuroforaminal stenosis.

## 2006-04-13 DIAGNOSIS — G4700 Insomnia, unspecified: Secondary | ICD-10-CM | POA: Insufficient documentation

## 2006-04-13 DIAGNOSIS — I10 Essential (primary) hypertension: Secondary | ICD-10-CM

## 2006-04-13 DIAGNOSIS — K219 Gastro-esophageal reflux disease without esophagitis: Secondary | ICD-10-CM

## 2006-04-13 DIAGNOSIS — D259 Leiomyoma of uterus, unspecified: Secondary | ICD-10-CM | POA: Insufficient documentation

## 2006-04-13 DIAGNOSIS — H409 Unspecified glaucoma: Secondary | ICD-10-CM | POA: Insufficient documentation

## 2006-04-13 DIAGNOSIS — IMO0002 Reserved for concepts with insufficient information to code with codable children: Secondary | ICD-10-CM | POA: Insufficient documentation

## 2006-04-13 DIAGNOSIS — M67919 Unspecified disorder of synovium and tendon, unspecified shoulder: Secondary | ICD-10-CM | POA: Insufficient documentation

## 2006-04-13 DIAGNOSIS — M1710 Unilateral primary osteoarthritis, unspecified knee: Secondary | ICD-10-CM | POA: Insufficient documentation

## 2006-04-13 DIAGNOSIS — R6889 Other general symptoms and signs: Secondary | ICD-10-CM

## 2006-04-13 DIAGNOSIS — E209 Hypoparathyroidism, unspecified: Secondary | ICD-10-CM

## 2006-04-13 DIAGNOSIS — E669 Obesity, unspecified: Secondary | ICD-10-CM

## 2006-04-13 DIAGNOSIS — N3941 Urge incontinence: Secondary | ICD-10-CM | POA: Insufficient documentation

## 2006-04-13 DIAGNOSIS — D510 Vitamin B12 deficiency anemia due to intrinsic factor deficiency: Secondary | ICD-10-CM

## 2006-04-13 DIAGNOSIS — M810 Age-related osteoporosis without current pathological fracture: Secondary | ICD-10-CM

## 2006-04-13 DIAGNOSIS — F411 Generalized anxiety disorder: Secondary | ICD-10-CM

## 2006-04-13 DIAGNOSIS — E039 Hypothyroidism, unspecified: Secondary | ICD-10-CM | POA: Insufficient documentation

## 2006-04-13 DIAGNOSIS — M940 Chondrocostal junction syndrome [Tietze]: Secondary | ICD-10-CM | POA: Insufficient documentation

## 2006-04-13 DIAGNOSIS — F329 Major depressive disorder, single episode, unspecified: Secondary | ICD-10-CM

## 2006-04-13 DIAGNOSIS — M719 Bursopathy, unspecified: Secondary | ICD-10-CM

## 2006-04-13 DIAGNOSIS — N183 Chronic kidney disease, stage 3 (moderate): Secondary | ICD-10-CM

## 2006-04-13 DIAGNOSIS — E785 Hyperlipidemia, unspecified: Secondary | ICD-10-CM

## 2006-04-13 DIAGNOSIS — E079 Disorder of thyroid, unspecified: Secondary | ICD-10-CM

## 2006-04-13 HISTORY — DX: Age-related osteoporosis without current pathological fracture: M81.0

## 2006-04-13 HISTORY — DX: Disorder of thyroid, unspecified: E07.9

## 2006-04-14 ENCOUNTER — Encounter (INDEPENDENT_AMBULATORY_CARE_PROVIDER_SITE_OTHER): Payer: Self-pay | Admitting: *Deleted

## 2006-04-28 ENCOUNTER — Encounter: Admission: RE | Admit: 2006-04-28 | Discharge: 2006-04-28 | Payer: Self-pay | Admitting: *Deleted

## 2006-05-31 ENCOUNTER — Emergency Department (HOSPITAL_COMMUNITY): Admission: EM | Admit: 2006-05-31 | Discharge: 2006-05-31 | Payer: Self-pay | Admitting: Emergency Medicine

## 2006-06-14 ENCOUNTER — Ambulatory Visit: Payer: Self-pay | Admitting: *Deleted

## 2006-06-14 ENCOUNTER — Inpatient Hospital Stay (HOSPITAL_COMMUNITY): Admission: RE | Admit: 2006-06-14 | Discharge: 2006-06-20 | Payer: Self-pay | Admitting: *Deleted

## 2006-06-14 ENCOUNTER — Emergency Department (HOSPITAL_COMMUNITY): Admission: EM | Admit: 2006-06-14 | Discharge: 2006-06-14 | Payer: Self-pay | Admitting: Emergency Medicine

## 2006-06-20 ENCOUNTER — Ambulatory Visit (HOSPITAL_COMMUNITY): Admission: RE | Admit: 2006-06-20 | Discharge: 2006-06-20 | Payer: Self-pay | Admitting: *Deleted

## 2006-07-11 ENCOUNTER — Inpatient Hospital Stay (HOSPITAL_COMMUNITY): Admission: EM | Admit: 2006-07-11 | Discharge: 2006-07-14 | Payer: Self-pay | Admitting: Emergency Medicine

## 2006-12-11 ENCOUNTER — Emergency Department (HOSPITAL_COMMUNITY): Admission: EM | Admit: 2006-12-11 | Discharge: 2006-12-11 | Payer: Self-pay | Admitting: Emergency Medicine

## 2006-12-12 ENCOUNTER — Encounter: Admission: RE | Admit: 2006-12-12 | Discharge: 2006-12-12 | Payer: Self-pay | Admitting: Orthopedic Surgery

## 2006-12-26 ENCOUNTER — Encounter: Admission: RE | Admit: 2006-12-26 | Discharge: 2006-12-26 | Payer: Self-pay | Admitting: Orthopedic Surgery

## 2007-01-09 ENCOUNTER — Encounter: Admission: RE | Admit: 2007-01-09 | Discharge: 2007-01-09 | Payer: Self-pay | Admitting: Orthopedic Surgery

## 2007-01-23 ENCOUNTER — Encounter: Admission: RE | Admit: 2007-01-23 | Discharge: 2007-01-23 | Payer: Self-pay | Admitting: Orthopedic Surgery

## 2007-05-15 ENCOUNTER — Encounter: Admission: RE | Admit: 2007-05-15 | Discharge: 2007-05-15 | Payer: Self-pay | Admitting: *Deleted

## 2007-11-01 ENCOUNTER — Encounter: Admission: RE | Admit: 2007-11-01 | Discharge: 2007-11-01 | Payer: Self-pay | Admitting: Orthopedic Surgery

## 2007-11-15 ENCOUNTER — Encounter: Admission: RE | Admit: 2007-11-15 | Discharge: 2007-11-15 | Payer: Self-pay | Admitting: Orthopedic Surgery

## 2007-11-29 ENCOUNTER — Encounter: Admission: RE | Admit: 2007-11-29 | Discharge: 2007-11-29 | Payer: Self-pay | Admitting: Orthopedic Surgery

## 2008-04-10 ENCOUNTER — Encounter: Admission: RE | Admit: 2008-04-10 | Discharge: 2008-04-10 | Payer: Self-pay | Admitting: Orthopedic Surgery

## 2008-04-22 ENCOUNTER — Encounter: Admission: RE | Admit: 2008-04-22 | Discharge: 2008-04-22 | Payer: Self-pay | Admitting: Orthopedic Surgery

## 2008-05-15 ENCOUNTER — Encounter: Admission: RE | Admit: 2008-05-15 | Discharge: 2008-05-15 | Payer: Self-pay | Admitting: *Deleted

## 2008-10-06 ENCOUNTER — Encounter: Admission: RE | Admit: 2008-10-06 | Discharge: 2008-10-06 | Payer: Self-pay | Admitting: Internal Medicine

## 2009-02-10 ENCOUNTER — Ambulatory Visit (HOSPITAL_COMMUNITY): Admission: RE | Admit: 2009-02-10 | Discharge: 2009-02-10 | Payer: Self-pay | Admitting: Internal Medicine

## 2009-03-10 ENCOUNTER — Encounter: Admission: RE | Admit: 2009-03-10 | Discharge: 2009-03-10 | Payer: Self-pay | Admitting: Orthopedic Surgery

## 2009-04-27 ENCOUNTER — Encounter: Admission: RE | Admit: 2009-04-27 | Discharge: 2009-04-27 | Payer: Self-pay | Admitting: Internal Medicine

## 2009-05-19 ENCOUNTER — Encounter: Admission: RE | Admit: 2009-05-19 | Discharge: 2009-05-19 | Payer: Self-pay | Admitting: Internal Medicine

## 2009-06-18 ENCOUNTER — Encounter: Admission: RE | Admit: 2009-06-18 | Discharge: 2009-06-18 | Payer: Self-pay | Admitting: Internal Medicine

## 2009-10-13 ENCOUNTER — Encounter: Admission: RE | Admit: 2009-10-13 | Discharge: 2009-10-13 | Payer: Self-pay | Admitting: Internal Medicine

## 2009-12-10 ENCOUNTER — Ambulatory Visit (HOSPITAL_COMMUNITY): Admission: RE | Admit: 2009-12-10 | Discharge: 2009-12-10 | Payer: Self-pay | Admitting: Internal Medicine

## 2010-01-27 ENCOUNTER — Ambulatory Visit (HOSPITAL_COMMUNITY)
Admission: RE | Admit: 2010-01-27 | Discharge: 2010-01-27 | Payer: Self-pay | Source: Home / Self Care | Attending: Rheumatology | Admitting: Rheumatology

## 2010-02-02 ENCOUNTER — Ambulatory Visit (HOSPITAL_COMMUNITY)
Admission: RE | Admit: 2010-02-02 | Discharge: 2010-02-02 | Payer: Self-pay | Source: Home / Self Care | Attending: Nephrology | Admitting: Nephrology

## 2010-04-21 ENCOUNTER — Other Ambulatory Visit: Payer: Self-pay | Admitting: Internal Medicine

## 2010-04-21 DIAGNOSIS — Z1231 Encounter for screening mammogram for malignant neoplasm of breast: Secondary | ICD-10-CM

## 2010-05-21 ENCOUNTER — Ambulatory Visit
Admission: RE | Admit: 2010-05-21 | Discharge: 2010-05-21 | Disposition: A | Discharge status: Home / Self Care | Payer: PRIVATE HEALTH INSURANCE | Source: Ambulatory Visit | Attending: Internal Medicine | Admitting: Internal Medicine

## 2010-05-21 DIAGNOSIS — Z1231 Encounter for screening mammogram for malignant neoplasm of breast: Secondary | ICD-10-CM

## 2010-06-29 NOTE — Consult Note (Signed)
NAME:  AKEIRA, LAHM               ACCOUNT NO.:  [id number]   MEDICAL RECORD NO.:  [id number]          PATIENT TYPE:  INP   LOCATION:  6529                         FACILITY:  [facility]   PHYSICIAN:  Ulyses Amor, MD DATE OF BIRTH:  July 25, 1935   DATE OF CONSULTATION:  07/12/2006  DATE OF DISCHARGE:                                 CONSULTATION   Jennifer [name] is a 75 year old black woman who is admitted to [facility] for further evaluation of chest pain.   The patient has a history of nonischemic cardiomyopathy.  Cardiac  catheterization in April, 2006 demonstrated an ejection fraction of 35%  and nonobstructive coronary artery lesion.   The patient presented to the emergency department with a history of  chest pain which began last night.  It lasted approximately six hours  last night.  It then began today after lunch and continued for the  ensuing 12 hours.  The chest pain is described as a pressure in a focal  region in the lower substernal area.  It radiates to the left shoulder.  It is associated with dyspnea but no diaphoresis or nausea.  There are  no exacerbating or ameliorating factors.  It appears not to be related  to position, activity, meals, or respirations.  She has not taken any  nitroglycerin.  It has largely resolved within the last 30 minutes.   The patient has a number of risk factors for coronary artery disease,  including hypertension and dyslipidemia.  There is no history of  diabetes mellitus.  There is a family history of coronary artery  disease.   Other medical problems include hypothyroidism and gastroesophageal  reflux.   MEDICATIONS:  Prevacid, lisinopril, Coreg, Wellbutrin, Risperdal,  Duragesic.   ALLERGIES:  CODEINE.   OPERATIONS:  Bilateral total knee replacements, cholecystectomy, right  nephrectomy.   FAMILY HISTORY:  Significant for coronary artery disease.   SOCIAL HISTORY:  Patient lives in an assisted living facility.   She  neither smokes cigarettes nor drinks alcohol.   REVIEW OF SYSTEMS:  No new problems related to her head, eyes, ears,  mouth, throat, lungs, gastrointestinal system, genitourinary system, or  extremities.  There is no history of neurologic or psychiatric disorder.  There is no history of fever, chills, or weight loss.   PHYSICAL EXAMINATION:  VITAL SIGNS:  Blood pressure 147/79, pulse 63 and  regular, respirations 18, temperature 97.4.  GENERAL:  The patient was an elderly white woman in no discomfort.  She  was alert, oriented, appropriate, and responsive.  HEENT: Normal.  NECK:  Without thyromegaly or adenopathy.  Carotid pulses were palpable  bilaterally.  A loud right carotid bruit was heard.  CARDIAC:  A normal S1 and S2.  An S4 was present.  There was no S3,  murmur, rub, or click.  The cardiac rhythm was regular.  No chest wall  tenderness was noted.  LUNGS:  Clear.  ABDOMEN:  Soft and nontender.  There was no mass, hepatosplenomegaly,  bruit, distention, rebound, guarding, or rigidity.  Bowel sounds were  normal.  BREASTS/PELVIC/RECTAL:  Not performed,  as they were not pertinent for  the reason for acute care hospitalization.  EXTREMITIES:  Without edema, deviation, or deformity.  Radial and  dorsalis pedis pulses were palpable bilaterally.  NEUROLOGIC:  Brief screening neurologic survey was unremarkable.   The electrocardiogram revealed normal sinus rhythm with left bundle  branch block.   The initial set of cardiac markers revealed a myoglobin of 86.4, CK-MB  less than 1, and troponin less than 0.05.  The second set of cardiac  markers revealed a myoglobin of 73.4, CK-MB less than 1, and troponin  less than 0.05.  BNP was 120.  White count was 5.8 with a hemoglobin of  11.2 and hematocrit of 34.  Potassium is 3.9, BUN 15, creatinine 0.93.  The remaining studies were pending at the time of this dictation.   IMPRESSION:  1. Chest pain, rule out unstable angina.  2.  Nonischemic cardiomyopathy.  Cardiac catheterization in April, 2006      demonstrated an ejection fraction of 35% with nonobstructive      coronary artery disease.  3. Hypertension.  4. Dyslipidemia.  5. Hypothyroidism.  6. Gastroesophageal reflux.   RECOMMENDATIONS:  1. Telemetry.  2. Serial cardiac enzymes.  3. Aspirin.  4. Intravenous heparin or subcutaneous Lovenox.  5. Intravenous nitroglycerin.  6. Further measures per Dr. Jacinto Halim.      Ulyses Amor, MD  Electronically Signed     MSC/MEDQ  D:  07/12/2006  T:  07/12/2006  Job:  [id number]   cc:   Cristy Hilts. Jacinto Halim, MD

## 2010-06-29 NOTE — H&P (Signed)
NAME:  Jennifer [name], Jennifer [name]               ACCOUNT NO.:  [id number]   MEDICAL RECORD NO.:  [id number]          PATIENT TYPE:  INP   LOCATION:  6529                         FACILITY:  [facility]   PHYSICIAN:  Hettie Holstein, D.O.    DATE OF BIRTH:  05-05-1935   DATE OF ADMISSION:  07/11/2006  DATE OF DISCHARGE:                              HISTORY & PHYSICAL   PRIMARY CARE PHYSICIAN:  Bertram Millard. Hyacinth Meeker, M.D.   She resides at [facility] under cardiologist, Dr. Nicki Guadalajara.   CHIEF COMPLAINT:  Chest pain.   HISTORY OF PRESENTING ILLNESS:  Jennifer [name] is a pleasant 75 year old  female with known nonischemic cardiomyopathy who had undergone cardiac  catheterization in 2006 by Dr. Yates Decamp at which time it was discovered  that she had noncritical coronary disease.  In any event, she was  awakened from sleep around 12:30 this morning with a mid sternal dull  chest pain radiating to her left arm associated with shortness of  breath.  She says this is not like any pain that she has experienced  before, and she denies having shortness of breath, and in any event, in  the emergency department, her EKG tracing revealed left bundle branch  block, and she continued having discomfort and pain.  She was seen by  ________ as stable; however, I did contact her cardiologist for further  input.  We also cycled her cardiac markers.  Her initial point-of-care  markers are negative.   PAST MEDICAL HISTORY:  As above for nonischemic cardiomyopathy with an  ejection fraction of 35% status post cardiac catheterization in April  2006 with findings as described above.  She is status post right  nephrectomy due to nephrolithiasis, status post cholecystectomy.  History of dyslipidemia, hypertension, bilateral total knee  arthroplasty.  She had a recent behavioral health course due to  olfactory hallucinations.   ALLERGIES:  CODEINE.   MEDICATIONS:  As provided by pharmacy at [facility], these include  Prevacid 30 mg daily, lisinopril 20 mg daily, Coreg 25 mg b.i.d.,  bupropion 150 mg daily, Duragesic 50 mg every 72 patch, Ativan 1 mg  q.a.m. and q.h.s., Risperdal 0.25 mg 1 p.o. every 6 hours, and Darvocet-  N 100 with 650 p.o. q.i.d. p.r.n..   SOCIAL HISTORY:  The patient resides at [facility].  She denies tobacco or  alcohol.  She is divorced, she has 3 children and some grandchildren  living in the area.  She had a former history of alcohol use but none  recently.   FAMILY HISTORY:  Mother passed away at age 87 with gastric cancer.  Father died at age 4.   REVIEW OF SYSTEMS:  She had been in her usual state of health.  She  denies any nausea or vomiting.  She does report some constipation, has  no fever or chills.  There is swelling in her lower extremities.  She  does report she has some abdominal discomfort she attributes to  constipation.  No blood in the stools.  No hematemesis or hematochezia.  Other review of systems is unremarkable.  PHYSICAL EXAMINATION:  VITAL SIGNS:  In the emergency department, her  blood pressure was 147/79, temperature of  97.4, pulse 63, respirations  18, O2 saturation 97%.  HEENT:  Head is normocephalic, atraumatic.  Extraocular muscles intact.  NECK:  Supple.  nontender, no palpable thyromegaly or mass.  CARDIOVASCULAR EXAM:  Normal S1 and S2, without appreciable murmur.  LUNGS:  Clear bilaterally.  ABDOMEN:  Soft.  No rebound or guarding.  No suprapubic or  costovertebral angle tenderness.  LOWER EXTREMITIES:  Reveal no edema.  Peripheral pulses are symmetrical  and palpable.  NEUROLOGICAL EXAM:  Reveals her to move all 4 extremities spontaneously  without focal neurologic deficits.   LABORATORY DATA:  Reveal her sodium to be 136, potassium 3.9, BUN 15,  creatinine 0.93, glucose 93.  Chest x-ray reveals COPD, emphysema, mild  cardiomegaly with some vascular congestion but no edema.  Point-of-care-  markers was negative, and her BNP was 120.   EKG reveals normal sinus  rhythm with left bundle branch block.  WBC was 5.8, hemoglobin 11.2,  platelet count 342, MCV was 102.   ASSESSMENT:  1. Chest pain with catheterization as noted above by Dr. Yates Decamp      with noncritical disease.  She has left bundle branch block.  2. Hypertension.  3. Nonischemic cardiomyopathy.  4. Status post nephrectomy.  5. Dyslipidemia.   Plan at this time is that [name] will be admitted for further  observation and evaluation.  She does have left bundle branch block.  We  will cycle her cardiac markers.  We will involve cardiology if she  continues to have chest discomfort and pain.  Continue aspirin, beta  blockers, ACE inhibitors, and await further cardiac input, in reference  to full dose of anticoagulation.      Hettie Holstein, D.O.  Electronically Signed     ESS/MEDQ  D:  07/11/2006  T:  07/12/2006  Job:  [id number]

## 2010-06-29 NOTE — Discharge Summary (Signed)
NAME:  Jennifer [name], Jennifer [name]               ACCOUNT NO.:  [id number]   MEDICAL RECORD NO.:  [id number]          PATIENT TYPE:  INP   LOCATION:  6529                         FACILITY:  [facility]   PHYSICIAN:  Ladell Pier, M.D.   DATE OF BIRTH:  06/11/1935   DATE OF ADMISSION:  07/11/2006  DATE OF DISCHARGE:  07/14/2006                               DISCHARGE SUMMARY   DISCHARGE DIAGNOSES:  1. Atypical chest pain with negative enzymes and recent cath done by      [facility], Dr. Jacinto Halim, that was clean, cleared      to be discharge by cardiology.  2. Coronary artery disease/nonischemic cardiomyopathy.  EF of 35% on      cath April of 2006.  3. Hypertension.  4. Dyslipidemia.  5. Gastroesophageal reflux disease.  6. Hypothyroidism.  7. B12 deficiency.  8. Status post nephrectomy.   DISCHARGE MEDICATIONS:  1. Prevacid 30 mg daily.  2. Lisinopril 20 mg daily.  3. Coreg 25 mg twice daily.  4. Bupropion 150 mg daily.  5. Duragesic patch 50 mg every 72 hours.  6. Ativan  1 mg q.a.m. and one at bedtime.  7. Risperdal 0.25 mg one p.o. every six hours.  8. Darvocet-N 100 p.r.n. q.i.d.  9. B12 1000 mcg daily.   CONSULTANTS:  Cardiology, Dr. Jacinto Halim   PROCEDURES:  None.   FOLLOWUP APPOINTMENTS:  Patient to follow up with cardiology, Dr. Jacinto Halim,  telephone number [phone].   HISTORY OF PRESENT ILLNESS:  Patient is a 75 year old female with a  history of nonischemic cardiomyopathy, came in with shortness of breath  and chest pain radiating to her left arm.  EKG showed left bundle-branch  block.   PAST MEDICAL HISTORY/FAMILY HISTORY/SOCIAL HISTORY/MEDS/ALLERGIES/REVIEW  OF SYSTEMS:  Per admission H&P.   PHYSICAL EXAMINATION ON DISCHARGE:  VITAL SIGNS:  Temperature 98.5,  pulse of 67, respirations 18, blood pressure 122/53, pulse ox 98% on  room air.  HEENT:  Head is normocephalic, atraumatic.  Pupils reactive to light.  Throat without erythema.  CARDIOVASCULAR:  Regular  rate and rhythm.  LUNGS:  Clear bilaterally.  ABDOMEN:  Positive bowel sounds.  EXTREMITIES:  Without edema.   HOSPITAL COURSE:  1. Coronary artery disease/chest pain/nonischemic cardiomyopathy:      Patient was admitted to the hospital, placed on nitroglycerin drip.      Chest pain resolved.  Cardiology was consulted.  It was deemed that      her chest pain was most likely noncardiac since she had clean      coronaries on recent cath.  She was weaned off the nitroglycerin      drip and she remained chest pain-free throughout her      hospitalization.  2. Hypertension:  She was continued on her home medications and blood      pressure remained stable.  3. Dyslipidemia:  Cholesterol was not checked during her      hospitalization.  4. Macrocytic anemia:  She had a B12 level checked and she was B12      deficient.  She was started on oral B12  and should be started on IM      B12 if no improvement with oral B12 supplement.   DISCHARGE LABS:  Sodium 139, potassium 4.4, chloride 98, glucose 97, BUN  15, creatinine 0.83.  WBC 5.8, hemoglobin 10.3, platelet 282.  CK 86, MB  0.9, troponin less than 0.02.  Folate 12.9, B12 168.  TSH 3.295.   Chest x-ray showed mild cardiomegaly without acute disease.      Ladell Pier, M.D.  Electronically Signed     NJ/MEDQ  D:  07/14/2006  T:  07/14/2006  Job:  [id number]   cc:   Cristy Hilts. Jacinto Halim, MD

## 2010-07-02 NOTE — H&P (Signed)
NAME:  Jennifer [name], Jennifer [name] NO.:  [id number]   MEDICAL RECORD NO.:  [id number]          PATIENT TYPE:  EMS   LOCATION:  MAJO                         FACILITY:  [facility]   PHYSICIAN:  Nicki Guadalajara, M.D.     DATE OF BIRTH:  1935-12-24   DATE OF ADMISSION:  06/09/2004  DATE OF DISCHARGE:                                HISTORY & PHYSICAL   ATTENDING PHYSICIAN:  Dr. Tresa Endo.   CHIEF COMPLAINT:  Chest pain, shortness of breath, nausea, sweating.   This is a 75 year old African-American female patient of Dr. Tresa Endo who  presented to the emergency room after she developed an onset of chest pain  early this afternoon.  The patient said it happened after she took a shower  and was sitting at the kitchen table around 12 or 12:30 p.m.  The pain felt  like severe pressure, severe ache in the middle of the chest, radiated to  the left arm, went up to the neck and both sides of the lower jaw and she  felt pain in the floor of the mouth and then the tongue.  The patient took  three nitroglycerin and it did not relieve the pain.  Along with the pain,  she has associated symptoms of nausea, shortness of breath and sweating.  The patient called our office and was advised to present to the emergency  room.   Of note, she was just seen in our office on June 07, 2004, and at that time  we made a plan to schedule her for a 2D echocardiogram, check some blood  work, increase the dose of her Coreg to 6.25 b.i.d. and start her on Lasix  as a diuretic, but apparently her condition deteriorated and she had to come  to [facility].   PAST MEDICAL HISTORY:  Significant for:  1.  Noncritical coronary artery disease.  She had her last catheterization      in March, 2004, that showed critical disease, 30-40% lesions in the      circumflex and LAD.  Also, she had a left ventricular dysfunction and      presumably nonischemic cardiomyopathy with EF 30%.  2.  Hypothyroidism.  3.  Dyslipidemia,  treated with statins.  4.  Osteoarthritis.  5.  She is status post bilateral total knee replacement.  6.  She is status post cholecystectomy.  7.  She has nephrolithiasis and is status post right nephrectomy.  8.  She also was diagnosed with UTI during her last hospitalization in      December, 2005.   FAMILY HISTORY:  Significant for coronary disease.   SOCIAL HISTORY:  She lives with her grandson.  Does not smoke, does not  drink alcohol and does not exercise.  She has 3 children 8 grandchildren and  3 great grandchildren.   ALLERGIES:  NO KNOWN DRUG ALLERGIES.   MEDICATIONS:  1.  Prilosec 40 mg daily.  2.  Zocor 40 mg daily.  3.  Paxil CR 50 mg daily.  4.  Synthroid 0.075 mg daily.  5.  Ditropan XL 5 mg daily.  6.  Imdur 30 mg daily.  7.  Coreg 6.25 mg b.i.d.  8.  Lasix 20 mg daily.  9.  Nortriptyline 50 mg daily.  10. Fosamax 70 mg weekly.   REVIEW OF SYSTEMS:  She complained of chest pain that was not __________  responsive even to three nitroglycerin, shortness of breath, nausea,  sweating.  She also complained of lower GI pain, tongue pain and discomfort  in the floor of the mouth.  She felt left arm pain and the patient said that  for a couple of nights before going to be she had to take at least one pill  of nitroglycerin with resolution of pain.  She denied any indigestion, any  diarrhea or constipation, denied dysuria, hematuria, melena.  She did  complain of knee pain but denied any myalgia or muscle ache.   PHYSICAL EXAM:  Blood pressure 124/71, she is afebrile, heart rate 92,  respirations 18 and oxygen saturations 98% on room air.  HEENT:  Normocephalic, atraumatic, extraocular movements intact.  NECK:  Without any obvious JVD or carotid bruits.  LUNGS:  Clear to auscultation bilaterally.  HEART:  Regular rate and rhythm, no significant murmurs heard, no rubs or  gallop.  ABDOMEN:  Obese, nontender, nondistended with active bowel sounds x4.  EXTREMITIES:   Without any edema and strong pedal pulses bilaterally.   IMPRESSION:  1.  Unstable angina pectoris.  2.  Hypertension.  3.  Known noncritical coronary artery disease.  4.  Nonischemic cardiomyopathy.  5.  Hypothyroidism.  6.  Osteoarthritis.   Dr. Tresa Endo had seen and examined the patient __________ on a rule out MI  protocol with her enzymes.  Will start her on heparin and IV nitroglycerin  and will schedule her for right and left heart catheterization for tomorrow.      MK/MEDQ  D:  06/09/2004  T:  06/09/2004  Job:  [id number]

## 2010-07-02 NOTE — Cardiovascular Report (Signed)
NAME:  Garde, Karolynn               ACCOUNT NO.:  [id number]   MEDICAL RECORD NO.:  [id number]          PATIENT TYPE:  INP   LOCATION:  4707                         FACILITY:  [facility]   PHYSICIAN:  Cristy Hilts. Jacinto Halim, MD       DATE OF BIRTH:  04/25/1935   DATE OF PROCEDURE:  06/09/2004  DATE OF DISCHARGE:                              CARDIAC CATHETERIZATION   PROCEDURE:  1.  Right heart catheterization.  2.  Left heart catheterization including left ventriculography.  3.  Selective right and left coronary arteriography.  4.  Right femoral angiography and closure of right femoral artery access      with StarClose.   INDICATIONS FOR PROCEDURE:  Ms. Garnett is a 75 year old African-American  female with history of known nonischemic cardiomyopathy who was recently  admitted to [facility] complaining of chest discomfort.  She was  brought back to the cardiac catheterization lab to evaluate her coronary  anatomy.   HEMODYNAMIC DATA:  Right heart catheterization:  RA pressure 11/10, mean of  10 mmHg.  RV 27/2 with EDP of 5 mmHg.  PA 27/12 with a mean of 17 mmHg.  PA  saturation was 66%.  Pulmonary capillary wedge 21/19 with a mean of 18 mmHg.  Aortic saturation was 93%.  Cardiac output was 5.3, cardiac index was 2.7 by  Fick.   Left heart catheterization:  Left ventricular pressure was 163/8 with end  diastolic pressure of 10 mmHg.  The aortic pressure was 166/89 with a mean  of 122 mmHg.  There was no pressure gradient across the aortic valve.   ANGIOGRAPHIC DATA:  1.  Left ventricle.  Left ventricular systolic function was markedly      depressed with ejection fraction of 35%.  There was global hypokinesis.      There was no significant mitral regurgitation.  2.  Right coronary artery.  The right coronary artery is a large caliber      vessel.  It is a dominant vessel and is normal.  3.  Left main coronary artery.  The left main coronary artery is a large      caliber vessel.  It is  normal.  4.  Left circumflex.  The circumflex is a large caliber vessel.  It gives      origin to a high obtuse marginal 1 and a moderate obtuse marginal 2, and      continues in the AV groove.  It appears to be codominant with the right      coronary artery.  5.  Left anterior descending.  The left anterior descending is a large      caliber vessel.  Gives origin to a moderate to a large size diagonal 1      and a small diagonal 2.  It wraps around the apex.  It is normal.   IMPRESSION:  1.  Normal coronary arteries.  2.  Nonischemic cardiomyopathy with ejection fraction of 35%.  3.  Right heart pressures within normal limits with preserved cardiac output      and cardiac index by Fick.  RECOMMENDATIONS:  Evaluation for noncardiac cause of chest pain is  indicated.   PROCEDURE TECHNIQUE:  Under the usual sterile precautions using a 6 French  right femoral artery access and a 7 French right femoral vein access, the  left and heart catheterization was performed respectively.   Right heart catheterization was performed using a balloon-tip Swan-Ganz  catheter that was advanced over the pulmonary artery easily.  The  hemodynamics were carefully measured.  Oxygen saturation was also obtained.  The catheter was easily pulled out of the body without any complications.   Left heart catheterization.  A multipurpose B2 catheter was advanced to the  ascending aorta over 0.035 inch guide wire.  The catheter was gently  advanced into the left ventricle.  The left ventricular pressures were  monitored.  Hand contrast injections of the left ventricle was performed  both in the LAO and RAO positions.  The catheter was flushed with saline,  pulled back into the ascending aorta, and pressure gradient across the  aortic valve was monitored.  The right coronary artery selectively engaged  and angiography was performed.  In a similar fashion, the left main coronary  artery was selectively engaged and  angiography was performed.  The catheter  was pulled from the body in the usual fashion.  The patient tolerated the  procedure well.  A total of 80 mL of Visipaque was utilized for diagnostic  angiography.  Right femoral angiography was performed through the arterial  access sheath and the access was closed with Starclose.  Venous sheath was  pulled on the table and manual pressure was held for 10 minutes with  adequate hemostasis.      JRG/MEDQ  D:  06/10/2004  T:  06/10/2004  Job:  [id number]   cc:   Nicki Guadalajara, M.D.  [phone] [address]  [place], [place] [address]  Fax: [phone]

## 2010-07-02 NOTE — H&P (Signed)
NAME:  Jennifer [name], Jennifer [name]               ACCOUNT NO.:  [id number]   MEDICAL RECORD NO.:  [id number]          PATIENT TYPE:  EMS   LOCATION:  ED                           FACILITY:   [facility]   PHYSICIAN:  Hollice Espy, M.D.DATE OF BIRTH:  1935/11/15   DATE OF ADMISSION:  09/16/2004  DATE OF DISCHARGE:                                HISTORY & PHYSICAL   CHIEF COMPLAINT:  Weakness and falls.   HISTORY OF PRESENT ILLNESS:  The patient is a 75 year old African-American  female with past medical history of hypertension, hypothyroidism, status  post a bilateral total knee replacement, as well as osteoarthritis and non-  ischemic cardiomyopathy who presents to the emergency room at [facility] after having a fall. Apparently she has been having  problems with falls in the past few weeks, occurring more and more, and she  has been having worsening weakness. She is not able to give much of a  history secondary to her difficult speech, although her family is able to  give me more of a history. The speech problem is not an acute issue.  Apparently the patient has been having more and more problems with weakness  and then after her fall today, just overall felt so fatigued that she could  not get up. She was brought into the emergency room for further evaluation.  A CT of her head was found to be unremarkable with no evidence of any acute  cerebrovascular accident. Lab work was checked as well. The patient was  noted to have an increased CPK of 273, although the MB and troponin I  components were unremarkable. Her BNP was unremarkable as well. She was  noted to have a white count slightly elevated at 11.4 with a 78% shift and  she had vacuolated neutrophils seen on peripheral smear. Her B-met was noted  as slightly low, potassium of 3.3, a slightly elevated creatinine of 1.6.  Currently the patient tells me that she is complaining of some bilateral  posterior knee pain as well as  pain in her right shoulder, where she landed  when she fell.   REVIEW OF SYSTEMS:  She denies any headaches, vision changes, chest pain,  shortness of breath, wheezing, coughing, abdominal pain, hematuria, dysuria,  constipation, or diarrhea. She says overall, she feels quite weak. The  review of systems is otherwise negative.   PAST MEDICAL HISTORY:  Hypothyroidism, osteoarthritis, non-ischemic  cardiomyopathy, coronary artery disease non-critical, history of  costochondritis, hypertension, status post bilateral total knee replacement,  status post cholecystectomy, status post right nephrectomy with history of  nephrolithiasis, history of urinary tract infection in the past with  enterococcus and dyslipidemia.   ALLERGIES:  CODEINE.   MEDICATIONS:  She is as well as she is supposed to be in a limited number of  NSAID'S because of only having 1 kidney.   SOCIAL HISTORY:  She denies any alcohol, tobacco, or drug use. She is cared  for by her family.   FAMILY HISTORY:  Noncontributory.   PHYSICAL EXAMINATION:  VITAL SIGNS:  On admission temperature 99.5, heart  rate 108 and down to 97. Blood pressure 133/85, respiratory rate 20, O2  saturation 99% on room air.  GENERAL:  The patient appears to be alert and oriented x2. In no acute  distress.  HEENT:  Normocephalic and atraumatic. She has some smacking of the lips,  some possible Parkinsonian-like mouth movements, although her family said  that this is secondary to her medications. The mucous membranes are slightly  dry. She has no carotid bruits.  HEART:  A regular rate and rhythm. S1 and S2.  LUNGS:  Clear to auscultation bilaterally.  ABDOMEN:  Soft, nontender, and nondistended. Positive bowel sounds.  EXTREMITIES:  She has some tenderness in her right shoulder but she has a  good grip of 5 out of 5. Her flexion and extension of her upper and lower  extremities is about a 5 minus out of 5 but symmetric and no focal deficits.   She has trace pitting edema.   LABORATORY DATA:  CPK 124, sodium 137, potassium 3.3, chloride 101, bicarb  24, BUN 16, creatinine 1.6, glucose 101, calcium 9.3. White count 11.4.  Hemoglobin and hematocrit 12.3 and 37.3. MCV of 94.3. Platelet count  349,000. UA shows small leukocyte esterase. BNP of 100. CPK 273. MB less  than 1. trop I less than 0.05. Noted, her peripheral smear showed vacuolated  neutrophils and urine micro shows 36 white cells and rare bacteria.   ASSESSMENT/PLAN:  1.  Weakness and falls. This seems to be more deconditioning more than      anything else but I want to rule out  any other medical causes of her      weakness. Her urine appears to be no essential urinary tract infection      and her chest x-ray is unremarkable, showing no bibasilar atelectasis.      However, vacuolated neutrophils can often been seen in early signs of      infection. Will continue to watch for this. In addition, she is on      Lasix, which may be contributing to her hypokalemia and mild acute renal      failure, which may be another cause of her weakness.  2.  Finally, she also has a diagnosis of hypothyroidism but I am not seeing      any Synthroid on her medication list and I am questioning if at some      point this medication was stopped or if she stopped taking it and she      may be hypothyroid, also contributing to her falls and weakness. Either      way, will follow her CBC for right now. Check a TSH. Give intravenous      fluids and replacement of potassium as well as get a PT consultation and      will be able to better assess her, once these things are complete.  3.  With regards to her osteoporosis, continue Fosamax.  4.  In regards to her osteoarthritis, continue pain mediation, which also      may be another note that with her chronic pain medication, be another      cause of her weakness.      SKK/MEDQ  D:  09/16/2004  T:  09/16/2004  Job:  [id number]   cc:   Theone Stanley, MD

## 2010-07-02 NOTE — H&P (Signed)
NAME:  Jennifer [name], Jennifer [name]               ACCOUNT NO.:  [id number]   MEDICAL RECORD NO.:  [id number]          PATIENT TYPE:  IPS   LOCATION:  0403                          FACILITY:  [facility]   PHYSICIAN:  Jasmine Pang, M.D. DATE OF BIRTH:  Jun 14, 1935   DATE OF ADMISSION:  06/14/2006  DATE OF DISCHARGE:                       PSYCHIATRIC ADMISSION ASSESSMENT   IDENTIFYING INFORMATION:  This is a 75 year old African American female  who is divorced.  This is a voluntary admission.   HISTORY OF PRESENT ILLNESS:  This is the first [facility] admission for this  pleasant 75 year old African American female who was referred by  [facility] mental health for olfactory hallucinations.  Apparently  she has been smelling smoke intermittently for the past month she said.  It subsided for a week or two then came back about a week and a half  ago.  She smells the smoke mostly in the evenings and at night and not  so much during the day.  She has been feeling much more fearful in her  home such that her sleep is decreased to 2 to 3 hours per night.  Her  appetite is decreased but she is not aware that she has lost any weight  and admits that she is getting confused about her meds and that she is  quite anxious.  She endorses stressors of having had to evict her 73-  year-old grandson from the home about a week and a half ago.  She feels  sad about this because she has raised him since childhood.  He dropped  out of school and she says that he became involved in drugs.  She let  him come back to the home and then believed that he was selling drugs  out of the home.  So for her own safety she asked him to leave a week  and a half ago.  Since that time she has felt bad about this, been  unable to sleep and also been fearful in the home, afraid to stay alone.  She recently has gone to live with her daughter in [place] where she is currently staying.  The pharmacy has noted that  she attempted to  refill her Ativan about 15 days before it was do and  she does admit to getting her medicines a little bit mixed up.  She  denies any suicidal thoughts.  She has had one episode of smelling smoke  since arrived here but otherwise has been calm and appropriate.  Denying  any suicidal or homicidal thoughts.   PAST PSYCHIATRIC HISTORY:  The patient is followed by Dr. Archer Asa, her primary psychiatrist.  This is her first admission to [facility].  She has a history of one prior admission  to [facility] in the 70s and denies any other admissions.  She is unable to give much explanation as to why she was admitted back  in the 19s.  She does endorse having a history of depression.  She  denies any substance abuse.  She denies prior suicide attempts.  SOCIAL HISTORY:  Divorced [place] American female, currently maintains  her own residence at [facility] in [place]  She has  one daughter Marcelino Duster and two sons.  She was divorced in 1998 and  separated in 1964.  She has lived in the same home for 15 years.  She  also has one living sister and one brother.  No current legal problems.  Basic education.   FAMILY HISTORY:  Noncontributory.   ALCOHOL AND DRUG HISTORY:  She denies a history of substance use.   MEDICAL HISTORY:  The patient is unclear about her primary care  Damel Querry.  It is believed to be Dr. Hyacinth Meeker with a [facility].  She also sees a cardiologist but cannot recall his name.  Current medical problems are hypertension, arthritis NOS, degenerative  joint disease.  Past medical history is remarkable for history of a  right nephrectomy.  Also cholecystectomy and nonischemic cardiomyopathy  according to the record.  She also has bilateral knee replacements.  CT  scan in 2006 did note some mild brain atrophy but no acute findings.   CURRENT MEDICATIONS:  Wellbutrin XL 150 mg 3 tablets daily which she  reports  was increased to 3 tablets approximately 1 month ago, Ambien CR  6.25 mg h.s. p.r.n. insomnia, Ativan 1 mg twice a day, propoxyphene with  A-Pap 100 mg q. 4 hours p.r.n. for pain in her knees.  She wears a  fentanyl transdermal patch 50 mcg which was newly applied this morning  on May 1.  She also takes lisinopril 20 mg daily, Prevacid 30 mg daily,  carvedilol 25 mg b.i.d.  These medications were validated with the  pharmacy and the bottles that her daughter brought in.   DRUG ALLERGIES:  No known drug allergies.  She does have problems with  nausea with codeine.   POSITIVE PHYSICAL FINDINGS:  The patient's full physical exam was done  in the emergency room and is noted in the record.  Generally  unremarkable.  She does have some gait difficulties and ambulates with a  cane because of her bilateral knee pain.  Otherwise motor is normal.  She is fully alert, pleasant, cooperative with a sense of humor and  seems to be at a loss to explain her hallucinations.  On admission to  the unit, vital signs, she is about 5 feet 2 inches tall, 169 pounds,  temperature 98.2, pulse 71, respirations 19, blood pressure 165/92.   DIAGNOSTIC STUDIES:  CBC:  WBC 5.7, hemoglobin 12.4, hematocrit 37.3 and  platelets 355,000. Chemistries:  Sodium 142, potassium 4.2, chloride  107, carbon dioxide 28, BUN 14, creatinine 0.54 and random glucose 82.  Liver enzymes SGOT 18, SGPT 11, alkaline phosphatase was 198 and total  bilirubin was 0.6.  The alcohol level was less than 5.  TSH 1.987 and  urine drug screen was positive for benzodiazepines.   MENTAL STATUS EXAM:  This a fully alert, pleasant 75 year old [place]  American female with an appropriate affect, fully alert.  Speech is  normal in pace, tone and amount, articulate, relevant.  Her primary  complaint at this point is that she feels anxious and is asking for something for her nerves and we had not restarted her routine Ativan  yet.  Mood is anxious.   She is depressed.  She talks out quite a bit  about her grandson and how badly she feels.  She talked quite a bit  about the activities at home, very  detailed about what was going on at  night, people tapping on the window.  She would hear him ruffle around  but he denied that he was selling drugs.  She feels bad about evicting  him from the home but feels that it is for the best.  She was concerned  about losing her housing over this issue.  Thought process reveals no  suicidal thought.  No homicidal thought.  She does continue to describe  fairly graphically some olfactory hallucinations which are clearly worse  in the evenings.  No agitation.  No paranoia.  She does remember having  confused thinking about her medications and also believed at one point  that another relative was trying to harm her but she says she is at a  loss to explain that today but does remember having those feelings.  Cognition reflects orientation to person, place and situation.  Insight  is adequate.  Concentration and calculation are intact.  Impulse control  and judgment within normal limits.   AXIS I:  Psychosis NOS, rule out major depression, recurrent and severe  with psychosis.  AXIS II:  Deferred.  AXIS III:  Chronic pain secondary to arthritis. Hypertension by history.  History of bilateral total knee replacements and nonischemic  cardiomyopathy.  AXIS IV:  Severe stress with conflict in the home, having supportive  siblings and daughter and safe family to live with is an asset to her.  AXIS V:  Current 30, past year 80.   PLAN:  To voluntarily admit the patient with q. 15-minute checks in  place to alleviate her psychosis and improve her orientation.  We are  going to ask the case manager to contact her family first of all and  validate her concerns about the grandson's activities to rule out any  type of delusional construct.  We are going to contact Dr. Donell Beers and  have already placed a call to  him and also conference with the family  about follow-up care plans and whether she will be returning to [facility] versus of the granddaughters home.  We have added 0.5 mg of  Risperdal at  bedtime.  She reports her sleep is improved and has had no more  olfactory hallucinations since then.  Will resume her routine  medications with Ativan 1 mg now at a.m. and h.s.  We are going to start  the Wellbutrin 150 mg XL daily.  She says that she has not taken that in  a couple of weeks now.  Estimated length of stay is 5 days.      Margaret A. Lorin Picket, N.P.      Jasmine Pang, M.D.  Electronically Signed    MAS/MEDQ  D:  06/15/2006  T:  06/15/2006  Job:  [id number]

## 2010-07-02 NOTE — Discharge Summary (Signed)
NAME:  Jennifer [name], Jennifer [name]               ACCOUNT NO.:  [id number]   MEDICAL RECORD NO.:  [id number]          PATIENT TYPE:  INP   LOCATION:  1611                         FACILITY:  [facility]   PHYSICIAN:  Sherin Quarry, MD      DATE OF BIRTH:  10/21/1935   DATE OF ADMISSION:  09/16/2004  DATE OF DISCHARGE:                                 DISCHARGE SUMMARY   HOSPITAL COURSE:  Jennifer [name] is a 74 year old lady with a past history of  hypertension, hypothyroidism, bilateral total knee replacements,  degenerative joint disease, and nonischemic cardiomyopathy who presented to  the [facility] emergency room on September 16, 2004 after experiencing a fall.  She had been falling frequently in the last several weeks. She also  complained of increased weakness. The family report that she had been  excessively fatigued. A CT scan of the brain was obtained in the emergency  room and no evidence of acute abnormalities were detected. Physical exam at  the time of admission as described by Dr. Virginia Rochester:  Her temperature  was 99.5, heart rate 108, blood pressure 133/85, respiratory rate was 20.  The patient was alert and oriented. She had some smacking lip movements. Her  chest was clear. Cardiovascular exam revealed a regular rate and rhythm. The  abdomen was soft. It was nontender, it was nondistended. Bowel sounds were  present. On neurologic testing the patient was noted to have good motor  strength. Examination of extremities showed tenderness in the right shoulder  and trace pitting edema. Relevant laboratory studies obtained included CPK  of 124. BMET showed a potassium of 3.3, sodium was 137, glucose is 101,  creatinine 1.6. CBC revealed a white count 11,400; hemoglobin 12.3. BNP was  100. Urinalysis showed no significant findings. TSH was 5.47. Iron and total  iron binding capacity as well as ferritin were unremarkable. As previously  mentioned, a CT scan of the brain was negative for acute  bleeding. Atrophy  was noted. A chest x-ray showed no acute abnormalities.   During the course of the patient's hospitalization her vital signs were  carefully monitored. She remained afebrile. Blood pressure was consistently  in the range of 120 to 130 over 70 to 80. On admission, Dr. Rito Ehrlich  requested PT and OT evaluation. IV fluid of normal saline at 50 mL per hour  was instituted. Physical medicine and rehabilitation consult was obtained  and [facility] admission was suggested. Ultimately, on August 8, decision was made  to arrange for skilled nursing facility admission. The patient is to be  discharged to skilled nursing facility today.   DISCHARGE DIAGNOSES:  1. Chronic weakness with recurrent falling.  2. Hypothyroidism.  3. Osteoporosis and osteoarthritis.  4. History of chest pain, likely secondary to costochondritis.  5. Nonischemic cardiomyopathy.  6. Status post total knee replacement, cholecystectomy, right nephrectomy.   DISCHARGE MEDICATIONS:  These will consist of:  1. Pamelor 50 mg b.i.d.  2. Multiple vitamin one daily.  3. Norpramin 100 mg at bedtime daily.  4. Fosamax 70 mg weekly.  5. MS Contin 30 mg q.12h.  6.  Lasix 20 mg daily.  7. Ativan 0.25 mg b.i.d.  8. Roxicodone 5 mg q.6h. p.r.n. for pain.  9. Lomotil one with each loose stool, not to exceed five daily.  10.Tylenol 325 mg tablets two q.6h. p.r.n. for pain.  11.Ambien 5 mg at bedtime p.r.n. for sleep.  12.Maalox 15 mL q.6h. p.r.n. for indigestion.   The patient will follow up with the physician at nursing home facility.       SY/MEDQ  D:  09/21/2004  T:  09/21/2004  Job:  [id number]   cc:   With patient to nursing home   [facility]

## 2010-07-02 NOTE — Discharge Summary (Signed)
NAME:  Jennifer [name], Jennifer [name]               ACCOUNT NO.:  [id number]   MEDICAL RECORD NO.:  [id number]          PATIENT TYPE:  INP   LOCATION:  4707                         FACILITY:  [facility]   PHYSICIAN:  Nicki Guadalajara, M.D.     DATE OF BIRTH:  05-16-1935   DATE OF ADMISSION:  06/09/2004  DATE OF DISCHARGE:  06/11/2004                                 DISCHARGE SUMMARY   DISCHARGE DIAGNOSES:  1.  Chest pain, etiology unclear, likely costochondritis.  2.  Status post __________.  3.  Hypertension.  4.  Known history of coronary artery disease, noncritical.  5.  Nonischemic cardiomyopathy.  6.  Hypothyroidism.  7.  Osteoarthritis.  8.  Status post bilateral total knee replacement.  9.  Status post cholecystectomy.  10. Status post right nephrectomy with history of nephrolithiasis.  11. History of urinary tract infection.  12. Dyslipidemia, treated with statins.   HISTORY OF PRESENT ILLNESS:  A 75 year old African-American female patient  of Dr. Tresa Endo presented to the emergency room at [facility] after  she developed an onset of chest pain earlier in the afternoon of  presentation.  She took a shower and was sitting at the kitchen table around  noontime and at that time felt severe pressure in the chest.  It radiated to  her left arm and went up into both sides of her neck and her lower jaw.  Patient took nitroglycerin, which did not relieve the pain but along with  that, she also had shortness of breath, sweating, and complaint of nausea.  She called our office and was referred to [facility]  She was seen  on admission by Dr. Tresa Endo, and the plan was admit on rule-out MI protocol,  cycle enzymes, and consider for a cath the next morning.  In the past, the  patient had a cath in March, 2004 that showed noncritical coronary artery  disease with 30-40% lesion in the circumflex and LAD.  At that time, EF was  around 30%.   The next morning, the patient went into the cath lab.   Cath was performed by  Dr. Jacinto Halim.  It showed EF 25-35% and no progression of coronary disease.  No  change in coronary anatomy since March, 2004.  The chest pain are secondary  to musculoskeletal process.  The patient was offered to take NSAIDs for  probable costochondritis, but she has a history of a prior nephrectomy.   HOSPITAL LABORATORIES:  Her BMP showed sodium 140, potassium 3.7, chloride  109, CO2 29, BUN 17, creatinine 1.2, glucose 102.  White blood cell count  5.8, hemoglobin 10, hematocrit 29.3, platelet count 171.  TSH was normal.  Magnesium 2.1.  Cardiac panel was negative x3.  Lipid profile showed total  cholesterol 175, triglycerides 35, HDL 62, LDL 66.   The patient was examined by Dr. Elsie Lincoln on the day of discharge.  Considered  to be stable for discharge home.   DISCHARGE MEDICATIONS:  1.  Prilosec 40 mg daily.  2.  Zocor 20 mg daily.  3.  Paxil 50 mg daily.  4.  Synthroid 75 mcg daily.  5.  Ditropan XL 5 mg daily.  6.  Coreg 6.25 mg b.i.d.  7.  Lasix 20 mg daily.  8.  Nortriptyline 50 mg daily.  9.  Fosamax 70 mg weekly.  10. Morphine __________ p.r.n.   FOLLOW UP:  Follow up as scheduled with Dr. Tresa Endo on May 11th at 11:15.   DIET:  Low fat, low cholesterol diet.   ACTIVITY:  No strenuous activity.  No lifting greater than 5 pounds for 3  days post-cath.   The patient was instructed to report to the lab for blood work on May 1 to  have BMP checked.      Marina   MK/MEDQ  D:  08/23/2004  T:  08/23/2004  Job:  [id number]

## 2010-07-02 NOTE — Op Note (Signed)
   NAME:  Jennifer [name], Jennifer [name]                         ACCOUNT NO.:  [id number]   MEDICAL RECORD NO.:  [id number]                   PATIENT TYPE:  AMB   LOCATION:  ENDO                                 FACILITY:  [facility]   PHYSICIAN:  John C. Madilyn Fireman, M.D.                 DATE OF BIRTH:  28-Aug-1935   DATE OF PROCEDURE:  09/19/2002  DATE OF DISCHARGE:                                 OPERATIVE REPORT   PROCEDURE:  Colonoscopy.   INDICATIONS FOR PROCEDURE:  Diarrhea and colon cancer screening.   DESCRIPTION OF PROCEDURE:  The patient was placed in the left lateral  decubitus position and placed on the pulse monitor with continuous low-flow  oxygen delivered by nasal cannula.  She was sedated with 75 mcg IV fentanyl,  7 mg IV Versed.  The Olympus video colonoscope was inserted into the rectum  and advanced to the cecum, confirmed by transillumination of McBurney's  point and visualization of the ileocecal valve and appendiceal orifice.  Prep was good.  The cecum appeared normal.  The ascending colon revealed [name]  few scattered diverticula.  The transverse and descending colon appeared  normal.  Within the sigmoid colon there were also seen [name] few scattered  diverticula.  There was no other abnormalities.  The rectum appeared normal.  Biopsies were taken of the rectum to rule out collagenous or microscopic  colitis.  The scope was then withdrawn and the patient returned to the  recovery room in stable condition.  She tolerated the procedure well.  There  were no immediate complications.   IMPRESSION:  [name] few diverticula; otherwise normal study.   PLAN:  Await biopsy results.                                                John C. Madilyn Fireman, M.D.    JCH/MEDQ  D:  09/19/2002  T:  09/19/2002  Job:  [id number]

## 2010-07-02 NOTE — Discharge Summary (Signed)
NAME:  Jennifer [name], Jennifer [name]               ACCOUNT NO.:  [id number]   MEDICAL RECORD NO.:  [id number]          PATIENT TYPE:  IPS   LOCATION:  0403                          FACILITY:  [facility]   PHYSICIAN:  Jasmine Pang, M.D. DATE OF BIRTH:  August 17, 1935   DATE OF ADMISSION:  06/14/2006  DATE OF DISCHARGE:  06/20/2006                               DISCHARGE SUMMARY   IDENTIFICATION:  This is a 75 year old African American female who is  divorced.  She was admitted on a voluntary basis  on June 14, 2006.   HISTORY OF PRESENT ILLNESS:  This is the first [facility] admission for this  pleasant 75 year old African American female who was referred by  [facility] for olfactory hallucinations.  Apparently she had been smelling smoke intermittently for the past  month.  It subsided for a week or two, and then came back about a week  and half ago.  She smells the smoke mostly in the evenings and at night  and not so much during the day.  She has been feeling much more fearful  in her home such that her sleep is decreased two to three  hours per  night.  Her appetite is decreased, but she is not aware that she has  lost any weight and admits that she is getting confused about her  medications.  She states she is quite anxious.  She endorses stressors  of having to evict her 79 year old grandson from the home about a week  and half ago.  She feels sad about this because she raised him since  childhood.  He dropped out of school and she says that he became  involved in drugs.  She let him come back to the home and then believed  he was selling drugs out of the home.  For her own safety, she asked him  to leave a week and half ago.  Since that time, she felt bad about this  and has been unable to sleep.  She has also been fearful in the home,  afraid to stay alone.  She recently had gone to live with her daughter  in [place], [place] where she currently is staying.  The pharmacy  has noted that she attempted to refill her Ativan about 15 days before.  It was due and she does admit to getting her medications a little mixed  up.  She denies any suicidal thoughts.  She has one episode of smelling  smoke since arriving here, but otherwise has been calm and appropriate.  She is denying any suicidal or homicidal thoughts.  The patient is  followed by Dr. Archer Asa, her primary psychiatrist.  This is her  first admission to the [facility].  She has a  history of one prior admission to [facility] in the 1970s and  denies any other admissions.  She is unable to give much explanation as  to why she was admitted back in the 1970s.  She does endorse having a  history of depression.  She denies substance abuse.  She  denies prior  suicide attempts.  The patient currently has hypertension, arthritis  NOS, and degenerative joint disease.  Past history is remarkable for  right nephrectomy.  Also a cholecystectomy and a nonischemic  cardiomyopathy according to the record.  She also has had bilateral knee  replacements.  A CT scan in 2006 did note some brain atrophy, but no  acute findings.  Her current medications are listed at the beginning of  the hospital course.  She has no known drug allergies.  She does state  she has problems with nausea with codeine.   PHYSICAL FINDINGS:  The patient's full physical exam was done in the  emergency room and is noted in the record.  It was generally  unremarkable.  She does have some gait difficulties and ambulates with a  cane because of her bilateral knee pain.  Other motor is normal.  She  was fully alert, pleasant, cooperative with a sense of humor and seems  to be at a loss to explain her hallucinations.   DIAGNOSTIC STUDIES:  CBC revealed a WBC of 5.7, hemoglobin of 12.4,  hematocrit of 37.3 and platelets of 355,000.  Chemistries revealed a  sodium of 142, potassium of 4.2, chloride 107, carbon  dioxide 28, BUN  14, creatinine 0.54 and random glucose 82.  Liver enzymes include SGOT  of 18, SGPT of 11, alkaline phosphatase was 198 and total bilirubin was  0.6.  The alcohol level was less than 5.  TSH was 1.987.  Urine drug  screen was positive for benzodiazepines.   HOSPITAL COURSE:  Upon admission, the patient was continued on her  carvedilol 25 mg p.o. b.i.d., Prevacid 30 mg p.o. daily, lisinopril 20  mg p.o. daily and fentanyl TD 50 mcg t.d. q. 3 days.  She was also  started on Risperdal 0.5 mg p.o. q.h.s. and Ambien 10 mg p.o. q.h.s.  On  Jun 15, 2006 the patient was given Ativan 1 mg p.o. now and then Ativan 1  mg p.o. q.a.m. and q.h.s. as well as 0.5 mg q. 1 time daily p.r.n.  anxiety.  She was also started on Wellbutrin XL 150 mg p.o. q.a.m.  She  was started on Risperdal 0.25 mg p.o. q.6h. p.r.n. agitation.  On Jun 17, 2006 due to diarrhea, the patient was started on Imodium.  The patient  tolerated her medications well with no significant side effects.   Upon first meeting the patient, she states she smelled smoke that no one  else smells.  She is worried that this is trying to harm her.  She  states she is loosing sleep.  She admits to being off her meds for the  past 2 weeks.  I thought they weren't working.  She thought her  grandson was telling her he was going to kill her.  She admits to being  very confused.  Risperdal 0.5 mg p.o. q.h.s. was continued.  On Jun 16, 2006 the patient stated she was feeling better.  She still smells the  smoke and she still associates it with people trying to hurt her.  Her  sleep was fairly good.  Appetite was improving.  Her depression was  resolving.  Anxiety was still there, but improving.  There was no  suicidal or homicidal ideation.  We had an appointment with assisted  living on Monday Jun 19, 2006 [facility]) to look at placement for her. The patient was continued on Risperdal 0.5 mg p.o. q.h.s.  Started on  bupropion XL 150 mg  p.o. daily, Ativan 1 mg p.o. q.a.m. and q.h.s. and  Ambien 5 mg p.o. q.h.s. p.r.n. insomnia.  The patient on Jun 17, 2006 was  still smelling things, still paranoid that people were going to hurt  her.  On Jun 18, 2006 patient was smelling smoke, but it was less  intense.  She had slept well.  She was less paranoid that people were  trying to hurt her.   On Jun 19, 2006 the patient was anxious about her meeting with [facility].  Today she stated she felt a little nervous.  She still smelled smoke,  but no longer associates this was someone trying to harm her.  Her  appetite is poor.  Mood was anxious and nervous.  Affect consistent with  mood.  No suicidal or homicidal ideation.  No auditory or visual  hallucinations.  The patient later met with the [facility] assisted living  and was pleased with this possible placement.  She decided she wanted to  go there.  On Jun 20, 2006 the patient's mental status had improved.  She  was friendly cooperative, talkative with good eye contact.  Speech was  normal rate and flow.  Psychomotor activity was within normal limits.  Mood was somewhat anxious about the move to [facility], but overall  improved from admission status.  The affect was wide range.  No suicidal  or homicidal ideation.  No thoughts of self injurious behavior.  No  auditory or visual hallucinations.  No paranoia or delusions.  She no  longer felt anyone was trying to harm her.  She did not smell smoke.  Thought processes were logical and goal-directed.  Thought content no  predominant theme.  The patient cognitively was grossly back to  baseline.  It was felt the patient was safe to be discharged to the  [facility] assisted living today.   DISCHARGE DIAGNOSES:  AXIS I:  Mood disorder not otherwise specified.  AXIS II: None.  AXIS III: Arthritis, hypertension, bilateral total knee replacements  nonischemic cardiomyopathy.  AXIS IV: Severe (stress and conflict with primary support group,  medical  problems and burden of psychiatric illness.)  AXIS V: Global assessment of functioning upon discharge was 48.  Global  assessment of functioning upon admission was 30.  Global assessment of  functioning highest past year was 68.   DISCHARGE/PLAN:  There were no specific activity level or dietary  restrictions.   POST HOSPITAL CARE PLANS:  The patient will see Dr. Donell Beers at the  [facility] on Wednesday Jun 21, 2006 at 3:45 p.m.   DISCHARGE MEDICATIONS:  1. Prevacid 30 mg daily.  2. Lisinopril 20 mg daily.  3. Duragesic patch 50 mcg every 72 hours.  4. Risperdal 0.5 mg p.o. q.h.s.  5. Coreg 25 mg twice daily.  6. Ativan 1 mg in the a.m. and h.s.  7. Wellbutrin XL 150 mg daily.  8. Risperdal 0.25 mg every 6 hours p.r.n. anxiety or agitation.  9. Darvocet N 100/650 tablets 3 times daily if needed for pain.      Jasmine Pang, M.D.  Electronically Signed    BHS/MEDQ  D:  06/20/2006  T:  06/20/2006  Job:  [id number]

## 2010-07-02 NOTE — Discharge Summary (Signed)
NAME:  Jennifer [name], Jennifer [name]               ACCOUNT NO.:  [id number]   MEDICAL RECORD NO.:  [id number]          PATIENT TYPE:  INP   LOCATION:  3702                         FACILITY:  [facility]   PHYSICIAN:  Pearlean Brownie, M.D.DATE OF BIRTH:  1935-07-18   DATE OF ADMISSION:  02/07/2004  DATE OF DISCHARGE:  02/10/2004                                 DISCHARGE SUMMARY   DISCHARGE DIAGNOSES:  1.  Abdominal pain.  2.  Diarrhea, resolving.  3.  Altered mental status, improved.  4.  Hypertension.  5.  Urine colonization with Enterococcus.  6.  Hypothyroidism.   DISCHARGE MEDICATIONS:  1.  Synthroid 75 mcg p.o. daily.  2.  Omeprazole 40 mg p.o. daily.  3.  Zocor 40 mg p.o. q.h.s.  4.  Aspirin 81 mg p.o. daily.  5.  Carvedilol 3.125 mg p.o. b.i.d.  6.  Senna one tablet p.o. b.i.d. for constipation.  7.  Fosamax 70 mg p.o. weekly (q.Sunday).  8.  Nortriptyline 50 mg p.o. q.h.s.  9.  MS Contin 30 mg p.o. b.i.d.  10. MSIR 15 mg p.o. p.r.n. up to b.i.d.   ACTIVITY:  As tolerated.   DIET:  Decrease fatty and greasy foods and caffeine intake.   FOLLOWUP:  With Dr. Alvira Philips at [facility] on January  12 at 11:30 a.m.  Patient is to obtain a pelvic ultrasound prior to that  appointment and is to be notified of this appointment time by the [facility]  nursing staff.  Laboratories to follow up on:  A final blood culture report,  fecal white blood cells, stool culture, and vitamin B12 level.   CONSULTS:  None.   PROCEDURES:  None.   STUDIES:  CT of the abdomen and pelvis with contrast showed small,  nonobstructing left renal calculi and prior right nephrectomy.  There was  basilar linear atelectasis with scarring left greater than right, colonic  diverticula with no diverticulitis, sclerotic appearing sacrum possibly due  to Paget's disease, but cannot exclude metastatic involvement.  CT of the  head without contrast on February 07, 2004 showed moderate small vessel  ischemic  disease with mild atrophy.  No acute intracranial abnormality.   BRIEF ADMISSION HISTORY:  Ms. Lillyanna is a 75 year old black female with a  complicated past medical history who came to the emergency room with acute  mental status changes status post a fall about four days prior to admission.  She had been seen in the ER on December 23 at which time a work-up for  abdominal pain was negative.  On the day of admission she had been very  emotionally labile with intermittent delirium with hallucinations, but at  other times lucid and cooperative.  On admission her white count was 11.4,  hematocrit 12, creatinine 1.1.  Urine was negative and imaging studies were  as above.  She was admitted for evaluation of her altered mental status.   HOSPITAL COURSE:  #1 - ALTERED MENTAL STATUS:  By hospital day #2, patient's  mentation had cleared significantly.  The thought is that the patient has  some mild underlying dementia  which was aggravated by increasing amounts of  medication she was taking including anticholinergics and narcotics.  Her  medications were scaled back and her anticholinergics were held.  Her  __________ level was 9.  Her alcohol level was less than 5.  Her TSH was  normal at 2.586.  Her folate was 13.3 and her drug screen was positive for  benzos only.  Her ESR was 30.  Her vitamin B12 level is pending as of the  time of discharge.  A urine culture obtained on February 06, 2004 was  negative whereas one obtained on December 24 grew out greater than 100,000  colonies of Enterococcus sensitive to ampicillin, Levaquin, nitrofurantoin,  and vancomycin.  This was deemed likely colonization as the patient was  asymptomatic.  No other signs of infection were noted on CT scan.  Blood  culture was negative at the time of discharge but final report is still  pending.  Patient's medications were reviewed by Dr. Virgia Land and the  patient is to follow up with him after discharge to see how she  is doing.   #2 - ABDOMINAL PAIN:  Patient has had abdominal pain for months and had CT  scans on the 23rd as mentioned above.  Patient was started on a bowel  regimen and maintained on PPI.  She did have some diarrhea during  hospitalization which may have been secondary to her medication changes.  Per Dr. Virgia Land, will obtain a pelvic ultrasound as an outpatient prior to  her follow-up appointment with him.  Would also consider GI work-up with  possible endoscopy.   #3 - HYPERTENSION:  Patient was maintained on her outpatient Coreg with fair  blood pressure control.  Consider changing Toprol as an outpatient for  better blood pressure control.  Patient does have a single kidney status  post nephrectomy and therefore needs to use ACEs with caution, only if  necessary.   #4 - HYPOTHYROIDISM:  TSH was within normal limits during hospitalization.  Patient maintained on outpatient Synthroid dose.   #5 - DIARRHEA:  This developed during hospitalization and may be secondary  to alteration in her medications.  Stool culture was requested and fecal  white blood cells were pending at the time of discharge.  By the day of  discharge diarrhea had been resolving.   On the day of discharge the patient was afebrile with a temperature of 99.9,  blood pressure 143/88, heart rate 106, respiratory rate 20, O2 saturation  99% on room air.  It was noted that the evening prior to discharge the  patient's heart rate did increase to 140 nonsustained and came back down to  110 within seconds.  Patient did complain of  some dizziness with this episode, but had no recurrence of this prior to  discharge.  Patient was agreeable to discharge.   Other laboratories:  Fasting lipid panel showed total cholesterol of 170,  triglycerides 110, HDL 53, and LDL of 95.       JM/MEDQ  D:  02/10/2004  T:  02/10/2004  Job:  [id number]   cc:   Alvira Philips, M.D.  [facility]. Family Prac. Resident  [place]   [place] [address]  Fax: [phone]

## 2010-07-02 NOTE — H&P (Signed)
NAME:  Jennifer [name], Jennifer [name]               ACCOUNT NO.:  [id number]   MEDICAL RECORD NO.:  [id number]          PATIENT TYPE:  INP   LOCATION:  3702                         FACILITY:  [facility]   PHYSICIAN:  Franchot Mimes, MD      DATE OF BIRTH:  Apr 01, 1935   DATE OF ADMISSION:  02/07/2004  DATE OF DISCHARGE:                                HISTORY & PHYSICAL   ATTENDING PHYSICIAN:  Dr. Lendon Colonel.   CHIEF COMPLAINT:  Altered mental status.   HISTORY OF PRESENT ILLNESS:  The patient is a 75 year old African-American  female with complicated past history who presents to emergency department  with acute mental status changes.  According to the son and grandson, the  patient fell about 4 days ago.  She has been seen in the [facility] clinic on  December 20 and 21 and in the emergency department on December 23 and now  tonight.  The ED workup yesterday for abdominal pain was negative.  There  were no reports of altered mental status per the EDP from yesterday.  Today,  the patient has been very emotionally labile, with intermittent episodes of  delirium and hallucinations.  However at times the patient is very lucid and  cooperative.  The patient is apparently talking differently and has  exaggerated movements per her son and grandson.   PAST MEDICAL HISTORY:  1.  Hypothyroidism.  2.  Hyperlipidemia.  3.  Anxiety.  4.  Depression.  5.  Glaucoma.  6.  Hypertension.  7.  Gastroesophageal reflux disease.  8.  History of chronic renal insufficiency.  9.  Urge incontinence.  10. Multiple musculoskeletal complaints regarding osteoarthritis, shoulders      and knees.  11. Congestive heart failure with ischemic cardiomyopathy per [facility] in      2002.  12. Question of dementia per the son.   SOCIAL HISTORY:  The patient has been divorced for some time.  She has 3  children, one of whom lives in [place], another lives in [place], and  another lives in [place].  She apparently lives alone,  except her  nephew is home from college and he is staying with her.  There is another  niece in the area that helps take care of her.  The patient has a history of  alcoholism per the son and she used to drink hard liquor, however she has  not drank for some time.  The patient has no tobacco history and no known  history of illegal drug use.   FAMILY HISTORY:  The patient's mother passed away at age 17 from stomach  cancer.  The patient's father passed away at age 72.  The patient has a  brother who had an MI at age 79.   REVIEW OF SYSTEMS:  The patient denies fever, chills, sweats or weight loss.  The patient also denies any chest pain or edema.  The patient denies any  shortness of breath.  She has had chronic abdominal complaints for some time  but does not have any acute nausea and vomiting, constipation or diarrhea.  Psychologically, the patient  has had worsening memory and recognition for  the last several years per her son, which is consistent with a possible  progressive dementia.  The patient does complain of generalized weakness.   MEDICATIONS:  The patient is in charge of her own medicines and it is  unclear how well she takes them.  Her medication list includes :  1.  Aspirin 81 mg.  2.  Coreg 3.125 mg p.o. b.i.d.  3.  Ditropan XL 10 mg.  4.  Fosamax 70 mg weekly.  5.  Atarax 25 mg 3-4 times a day.  6.  Hyoscyamine .125 mg p.o. q.4 p.r.n.  7.  MS Contin 30 mg b.i.d.  8.  MSIR 15 mg q.12 p.r.n.  9.  Nitroglycerine 0.4 mg as needed.  10. Nortriptyline.  11. Prilosec OTC  40 mg daily.  12. Paxil 40 mg daily.  13. Senokot S 2 tabs b.i.d.  14. Synthroid .075 mg daily.  15. Zocor 40 mg at night.  The medicine bottles were brought in and some of these bottles were empty,  including the Hyoscyamine and Atarax which were filled only 2 to 3 weeks ago  and the patient's Synthroid was filled over 2 months ago and was  approximately half full.  She also had a bottle of Percocet  which she  received from the ER.  Her MS Contin was also empty but it was filled a  month ago.   ALLERGIES:  The patient has a single kidney and should not receive NSAIDs.   PHYSICAL EXAMINATION:  Vital signs normal except for elevated blood  pressure.  GENERAL:  The patient is somewhat disheveled.  She stays mostly in the fetal  position.  Her judgment and insight are inappropriate and she has fleeting  thoughts and mental status.  She is however oriented to person, place and  time, but she is inappropriately yelling at times and very histrionic.  HEENT:  She has moist mucous membranes but dry lips, otherwise normal.  NECK:  Normal.  CARDIOVASCULAR:  Regular rate, normal S1 and S2, no murmur.  LUNG:  Clear to auscultation bilaterally.  GI:  Soft, nontender, nondistended, positive bowel sounds, no organomegaly,  no masses, no pulsating masses.  SKIN:  No rash, no skin breakdown.  NEUROLOGIC:  Essentially nonfocal but right before the end of the neuro exam  the patient became acutely agitated, with yelling and thrashing of limbs,  claiming that we were trying to choke her.   LABORATORY DATA:  White blood cell count 11.4, hemoglobin 12, platelets 325,  neutrophils 81%.  Sodium 143, potassium 4, chloride 109, bicarb 26, BUN 16,  creatinine 1.1, glucose 78, AST 20, ALT 19, alkaline phosphatase 142,  bilirubin 0.9, total protein 6.9, albumin 3.3.  Lipase was 33.  Urinalysis  from December 23 was within normal limits and the culture from that urine is  negative.  A CT of the abdomen and pelvis from December 23 shows a small, non  obstructing left renal calculus, basal linear atelectasis and scarring, left  greater than right, colonic diverticula and sclerotic appearing sacrum  possibly due to Paget's disease but cannot rule out metastatic involvement.  A CT of the head performed tonight shows atrophy and small vessel disease  but nothing acute.  ASSESSMENT AND PLAN:  A 75 year old  female with acute altered mental status  of unknown etiology.  1.  Altered mental status.  There was a vas differential but it would be  difficult to imagine that inappropriate medication administration is not      playing a factor.  In addition, the history of alcohol abuse and      progressive dementia could mean there are multiple factors out of      balance.  We will do organic altered mental status workup including      neurologic sources such as stroke, cardiovascular sources such as MI, GI      sources such as ammonia, nutritional sources such as B12 and folate and      infectious causes with blood cultures and urine cultures and a PA and      lateral chest x-ray.  We will attempt to sedate her as little as      possible so we can have a good idea of her true mental status.  We will      also check TSH, alcohol level and urine drug screen.  We will also check      a sed rate given that metastatic disease is mentioned on the prior CT      and para neoplastic syndrome could present similar to this.  Given that      she is otherwise stable, there is no need to reverse any possible      medication overdoses.  We will observe her for correction with the aid      of her primary care Donika Butner and the family for baseline mental status.      We will also check a KUB for possible fecal impaction.  We will use      Haldol sparingly for IV sedation.  2.  Hypertension.  We will hold blood pressure medications as her blood      pressure is stable at this time and we want as few things to affect her      as possible.  3.  Hypothyroidism.  We will check a TSH and continue her Synthroid.  4.  Hyperlipidemia.  We will check an FLP this admission.  5.  Chronic abdominal pain.  Currently holding pain medications due to No.      1.  There does not appear to be an organic cause for her abdominal pain.  6.  Depression, anxiety and psych issues.  A psych eval may be necessary if      improvement is  inadequate after a few days.  7.  CHF.  There is no evidence of fluid overload.  I am gently hydrating at      100 mL/hour and will allow the patient to eat as tolerated.  8.  History of alcohol abuse.  I will await the alcohol level.  She does not      look like she is going into DTs because it is very transient and      agitation and there is no acute alcohol history.  9.  Code status.  The patient is a full code.  We did not discuss DNR with      the family as they were distraught over her current state.  She has no      advanced directive and no assigned health care power of attorney, thus      the 3 children will become designated decision makers if necessary.       TV/MEDQ  D:  02/08/2004  T:  02/08/2004  Job:  [id number]

## 2010-07-02 NOTE — Cardiovascular Report (Signed)
NAME:  [name], [name]                         ACCOUNT NO.:  [id number]   MEDICAL RECORD NO.:  [id number]                   PATIENT TYPE:  OIB   LOCATION:  2899                                 FACILITY:  [facility]   PHYSICIAN:  Nicki Guadalajara, M.D.                  DATE OF BIRTH:  Aug 08, 1935   DATE OF PROCEDURE:  05/15/2002  DATE OF DISCHARGE:                              CARDIAC CATHETERIZATION   INDICATIONS FOR PROCEDURE:  The patient is a 75 year old African-American  female who was referred for evaluation of chest pain.  The patient has  experienced recurrent episodes of chest discomfort.  A Cardiolite study  showed a dilated left ventricle with a suggestion of possible inferior wall  ischemia.  The patient also has left bundle branch block.  Because of  continuing episodes of chest pain, definitive diagnostic catheterization was  performed.  Of note, at the time of her Cardiolite study, ejection fraction  was calculated to be 34%.  She is therefore referred for right and left  heart catheterization.   PROCEDURE:  Right and left heart catheterization.   PROCEDURE:  After premedication with Valium 5 mg intravenously, the patient  was prepped and draped in the usual fashion.  The right femoral artery and  right femoral vein were punctured anteriorly, and arterial and venous  sheaths were inserted.  A Swan-Ganz catheter was inserted into the venous  sheath and advanced under hemodynamic monitoring and fluoroscopic guidance  to the RA, RV, pulmonary artery, pulmonary capillary wedge positions.  A  pigtail catheter was inserted into the central aorta via the arterial  sheath.  Saturations were obtained in the AO and pulmonary artery.  Thermodilution cardiac output was obtained.  A pigtail catheter was advanced  into the left ventricle.  Simultaneous pulmonary capillary wedge pressure  and left ventricular pressures were recorded.  A right heart pullback was  then performed.  Single plane  RAO ventriculography was done in this patient  who was status post right nephrectomy.  Distal aortography was also done to  make certain there was no isolated renal artery narrowing in her remaining  left renal artery.  Diagnostic catheterization was done with 6-French  Judkins #4 left and right coronary catheters.  The patient tolerated the  procedure well and returned to the outpatient unit in stable condition.   HEMODYNAMIC DATA:  1. Right atrial pressure:  A wave 7, mean 5.  2. Right ventricular pressure 29/4.  3. Pulmonary artery pressure 29/10.  4. Mean pulmonary capillary wedge pressure 9, A wave 12, V wave 10.  5. Central aortic pressure was 150/66.  6. Left ventricular pressure was 150/10, post A wave 13.  7. Oxygen saturation in the aorta was 97%; in the pulmonary artery, it was     70%.  8. Cardiac output by the thermodilution and Fick methods was 7.0 and 4.8     liters/minute.  Cardiac index was 3.3 and 2.3 liters/minute per m sq.   ANGIOGRAPHIC DATA:  1. Single plane RAO ventriculography showed reduced LV function with a     calculated ejection fraction of 30%.  There was hypokinesis of the     anterior wall and also of the inferior wall.  2. Distal aortography showed only 1 renal artery supplying the left kidney.     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  There was no renal artery stenosis or     significant aortoiliac disease.  3. The left main coronary artery was a short vessel that bifurcated into an     LAD and left circumflex system.  4. The LAD gave rise to 2 very high diagonal vessels.  There was 40%     narrowing in the first diagonal vessel and 30% narrowing in this diagonal     vessel which immediately arose after the first diagonal.  The LAD had 30%     proximal to mid narrowing.  5. The circumflex vessel gave rise to a high marginal vessel.  There was 30%     ostial narrowing, somewhat eccentric, in this marginal vessel, and the AV     groove circumflex between the  first marginal and second marginal had 20%     to 30% smooth narrowing.  6. The right coronary artery was angiographically normal.   IMPRESSION:  1. Cardiomyopathy with ejection fraction of 30% with wall motion     abnormalities involving the anterior, anterolateral wall, and inferior     wall.  2. Well-compensated right ventricular pressures.  3. Mild 2-vessel coronary obstructive disease with 30% LAD stenosis, 40%     first diagonal, 30% second diagonal stenosis; 30% stenosis in the first     marginal branch of the circumflex vessel with 20% to 30% AV groove     circumflex stenoses.   RECOMMENDATIONS:  Medical therapy.                                               Nicki Guadalajara, M.D.    TK/MEDQ  D:  05/15/2002  T:  05/15/2002  Job:  [id number]   cc:   Cardiac Catheterization Lab   [facility], Office   Dr. Lynnae January

## 2010-07-02 NOTE — Discharge Summary (Signed)
NAME:  Jennifer [name], Jennifer [name]               ACCOUNT NO.:  [id number]   MEDICAL RECORD NO.:  [id number]          PATIENT TYPE:  INP   LOCATION:  1611                         FACILITY:  [facility]   PHYSICIAN:  Theone Stanley, MD   DATE OF BIRTH:  11-02-1935   DATE OF ADMISSION:  09/16/2004  DATE OF DISCHARGE:                                 DISCHARGE SUMMARY   ADMISSION DIAGNOSES:  1.  Weakness with falls.  2.  Mild renal insufficiency.  3.  History of hypothyroidism.  4.  Osteoarthritis.  5.  Nonischemic cardiomyopathy.  6.  Coronary artery disease.  7.  History of costochondritis.  8.  Hypertension.  9.  Arthritis post bilateral total knee replacements.  10. Status post cholecystectomy.  11. Status post right nephrectomy.  12. History of urinary tract infection.   DISCHARGE DIAGNOSES:  1.  Weakness and falls appears to be secondary to deconditioning.  2.  Mild renal insufficiency resolved with intravenous fluids.  3.  History of hypothyroidism.  Currently normal TSH level.  4.  Osteoarthritis.  5.  Nonischemic cardiomyopathy.  6.  Coronary artery disease, not critical.  7.  History of costochondritis.  8.  Hypertension.   CONSULTATIONS:  None.   PROCEDURES AND DIAGNOSTIC TESTS:  The patient had an x-ray of her knee on  September 16, 2004.  No acute abnormalities.  No joint effusions.  Chest x-ray  on September 16, 2004 with no acute abnormalities; atelectasis at the bases,  particularly on the left.  CT of the head showed negative for bleed or other  acute intracranial processes, atrophy and nonspecific white-matter changes.   PERTINENT LABORATORIES:  White count of 6000, hemoglobin of 10.0, hematocrit  at 30.0, platelets at 396,000.  Sodium 139, potassium 4.3, chloride 107, CO2  of 27, glucose at 95, BUN at 23.0, creatinine at 1.3.  Calcium at 9.0.  TSH  at 5.478.  Cardiac enzymes were negative.  Beta natriuretic peptide was 100.  Iron studies showed an iron of 42, TIBC at 261, iron  saturation of 16,  ferritin at 79.  Urine was negative.   HOSPITAL COURSE:  Jennifer [name] is a 75 year old female who is presenting to the  hospital with weakness and falls.  Based on her workup, she has no evidence  of infection.  No evidence of intracranial processes.  In addition, on exam,  there is no evidence of weakness in her lower extremities.  Over the next  couple of days while here in the hospital, after receiving fluid and  nutrition, the patient's ability to ambulate was at her baseline.  Overall,  it appears that she has some deconditioning.  Of note, while here in the  hospital, the patient stated that she felt that she was going to have a  seizure.  However, she did not.  It appears there is a lot of anxiety  involved in this patient.  She was given Ativan.  That seemed to help with  her overall anxiety issues.  With regards to her anemia, her iron studies  showed some evidence of mild iron-deficiency anemia.  [facility] will let [facility] work her up further as an outpatient.  With regards to her  hypertension, she was continued on her blood pressure medications while here  in the hospital, and this is not an issue.  Please refer to the next  discharge summary for discharge medications and followup.       AEJ/MEDQ  D:  09/20/2004  T:  09/20/2004  Job:  [id number]   cc:   [facility]

## 2010-07-14 ENCOUNTER — Emergency Department (HOSPITAL_COMMUNITY): Payer: PRIVATE HEALTH INSURANCE

## 2010-07-14 ENCOUNTER — Emergency Department (HOSPITAL_COMMUNITY)
Admission: EM | Admit: 2010-07-14 | Discharge: 2010-07-14 | Disposition: A | Discharge status: Home / Self Care | Payer: PRIVATE HEALTH INSURANCE | Attending: Emergency Medicine | Admitting: Emergency Medicine

## 2010-07-14 DIAGNOSIS — E7800 Pure hypercholesterolemia, unspecified: Secondary | ICD-10-CM | POA: Insufficient documentation

## 2010-07-14 DIAGNOSIS — M129 Arthropathy, unspecified: Secondary | ICD-10-CM | POA: Insufficient documentation

## 2010-07-14 DIAGNOSIS — I10 Essential (primary) hypertension: Secondary | ICD-10-CM | POA: Insufficient documentation

## 2010-07-14 DIAGNOSIS — Z79899 Other long term (current) drug therapy: Secondary | ICD-10-CM | POA: Insufficient documentation

## 2010-07-14 DIAGNOSIS — Y9210 Unspecified residential institution as the place of occurrence of the external cause: Secondary | ICD-10-CM | POA: Insufficient documentation

## 2010-07-14 DIAGNOSIS — Y998 Other external cause status: Secondary | ICD-10-CM | POA: Insufficient documentation

## 2010-07-14 DIAGNOSIS — I428 Other cardiomyopathies: Secondary | ICD-10-CM | POA: Insufficient documentation

## 2010-07-14 DIAGNOSIS — M25569 Pain in unspecified knee: Secondary | ICD-10-CM | POA: Insufficient documentation

## 2010-07-14 DIAGNOSIS — M25559 Pain in unspecified hip: Secondary | ICD-10-CM | POA: Insufficient documentation

## 2010-07-14 DIAGNOSIS — W19XXXA Unspecified fall, initial encounter: Secondary | ICD-10-CM | POA: Insufficient documentation

## 2010-07-14 DIAGNOSIS — S0990XA Unspecified injury of head, initial encounter: Secondary | ICD-10-CM | POA: Insufficient documentation

## 2010-07-14 DIAGNOSIS — Z96659 Presence of unspecified artificial knee joint: Secondary | ICD-10-CM | POA: Insufficient documentation

## 2010-07-14 LAB — POCT I-STAT, CHEM 8
BUN: 12 mg/dL (ref 6–23)
Chloride: 109 mEq/L (ref 96–112)
Creatinine, Ser: 1 mg/dL (ref 0.4–1.2)
Glucose, Bld: 103 mg/dL — ABNORMAL HIGH (ref 70–99)
Potassium: 4.3 mEq/L (ref 3.5–5.1)
Sodium: 145 mEq/L (ref 135–145)

## 2011-04-18 ENCOUNTER — Other Ambulatory Visit: Payer: Self-pay

## 2011-04-18 ENCOUNTER — Emergency Department (HOSPITAL_COMMUNITY)
Admission: EM | Admit: 2011-04-18 | Discharge: 2011-04-19 | Disposition: A | Discharge status: Skilled Nursing Facility | Payer: PRIVATE HEALTH INSURANCE | Attending: Emergency Medicine | Admitting: Emergency Medicine

## 2011-04-18 ENCOUNTER — Emergency Department (HOSPITAL_COMMUNITY): Payer: PRIVATE HEALTH INSURANCE

## 2011-04-18 ENCOUNTER — Encounter (HOSPITAL_COMMUNITY): Payer: Self-pay | Admitting: Emergency Medicine

## 2011-04-18 DIAGNOSIS — R079 Chest pain, unspecified: Secondary | ICD-10-CM | POA: Insufficient documentation

## 2011-04-18 DIAGNOSIS — J438 Other emphysema: Secondary | ICD-10-CM | POA: Insufficient documentation

## 2011-04-18 DIAGNOSIS — F0390 Unspecified dementia without behavioral disturbance: Secondary | ICD-10-CM | POA: Insufficient documentation

## 2011-04-18 DIAGNOSIS — K219 Gastro-esophageal reflux disease without esophagitis: Secondary | ICD-10-CM | POA: Insufficient documentation

## 2011-04-18 DIAGNOSIS — Z79899 Other long term (current) drug therapy: Secondary | ICD-10-CM | POA: Insufficient documentation

## 2011-04-18 DIAGNOSIS — R10816 Epigastric abdominal tenderness: Secondary | ICD-10-CM | POA: Insufficient documentation

## 2011-04-18 HISTORY — DX: Unspecified dementia, unspecified severity, without behavioral disturbance, psychotic disturbance, mood disturbance, and anxiety: F03.90

## 2011-04-18 LAB — COMPREHENSIVE METABOLIC PANEL
ALT: 15 U/L (ref 0–35)
AST: 23 U/L (ref 0–37)
Alkaline Phosphatase: 110 U/L (ref 39–117)
CO2: 26 mEq/L (ref 19–32)
Chloride: 107 mEq/L (ref 96–112)
GFR calc non Af Amer: 48 mL/min — ABNORMAL LOW (ref >90)
Sodium: 141 mEq/L (ref 135–145)
Total Bilirubin: 0.3 mg/dL (ref 0.3–1.2)

## 2011-04-18 LAB — CBC
HCT: 32.9 % — ABNORMAL LOW (ref 36.0–46.0)
Platelets: 292 10*3/uL (ref 150–400)
RDW: 13.7 % (ref 11.5–15.5)
WBC: 7 10*3/uL (ref 4.0–10.5)

## 2011-04-18 LAB — CARDIAC PANEL(CRET KIN+CKTOT+MB+TROPI)
Relative Index: INVALID (ref 0.0–2.5)
Total CK: 67 U/L (ref 7–177)

## 2011-04-18 LAB — DIFFERENTIAL
Basophils Absolute: 0.1 10*3/uL (ref 0.0–0.1)
Lymphocytes Relative: 32 % (ref 12–46)
Neutro Abs: 3.9 10*3/uL (ref 1.7–7.7)

## 2011-04-18 LAB — URINALYSIS, ROUTINE W REFLEX MICROSCOPIC
Glucose, UA: NEGATIVE mg/dL
Hgb urine dipstick: NEGATIVE
Ketones, ur: NEGATIVE mg/dL
Protein, ur: NEGATIVE mg/dL

## 2011-04-18 LAB — URINE MICROSCOPIC-ADD ON

## 2011-04-18 MED ORDER — PANTOPRAZOLE SODIUM 20 MG PO TBEC: 40.0000 mg | DELAYED_RELEASE_TABLET | Freq: Every day | ORAL | Status: DC

## 2011-04-18 MED ORDER — GI COCKTAIL ~~LOC~~
30.0000 mL | Freq: Once | ORAL | Status: AC
  Administered 2011-04-18: 30 mL via ORAL
  Filled 2011-04-18: qty 30

## 2011-04-18 MED ORDER — ONDANSETRON HCL 4 MG/2ML IJ SOLN
4.0000 mg | Freq: Once | INTRAMUSCULAR | Status: AC
  Administered 2011-04-18: 4 mg via INTRAVENOUS
  Filled 2011-04-18: qty 2

## 2011-04-18 MED ORDER — PANTOPRAZOLE SODIUM 40 MG IV SOLR
40.0000 mg | Freq: Once | INTRAVENOUS | Status: AC
  Administered 2011-04-18: 40 mg via INTRAVENOUS
  Filled 2011-04-18: qty 40

## 2011-04-18 NOTE — ED Provider Notes (Signed)
History     CSN: [id number]  Arrival date & time 04/18/11  4098   First MD Initiated Contact with Patient 04/18/11 1922      Chief Complaint  Patient presents with  . Chest Pain    (Consider location/radiation/quality/duration/timing/severity/associated sxs/prior treatment) Patient is a 76 y.o. female presenting with chest pain. The history is provided by the patient.  Chest Pain The chest pain began 1 - 2 weeks ago. Chest pain occurs intermittently. The pain is associated with eating. The severity of the pain is mild. The quality of the pain is described as heavy. Primary symptoms include abdominal pain and nausea. Pertinent negatives for primary symptoms include no fever, no shortness of breath, no wheezing, no palpitations, no vomiting and no dizziness.  Pertinent negatives for associated symptoms include no numbness and no weakness.    Pt states that for the past 1-2 weeks she has had epigastric pain radiating up to her chest and the back of her throat worse after eating. She had an episode today associated with nausea. Pain is currently improved. Pt states pain feels like past episodes of GERD. No SOB, diarrhea, fever. Pt states she has an odor in her room that makes it worse.  Past Medical History  Diagnosis Date  . Dementia     History reviewed. No pertinent past surgical history.  No family history on file.  History  Substance Use Topics  . Smoking status: Not on file  . Smokeless tobacco: Not on file  . Alcohol Use:     OB History    Grav Para Term Preterm Abortions TAB SAB Ect Mult Living                  Review of Systems  Constitutional: Negative for fever and chills.  HENT: Negative for neck pain.   Respiratory: Negative for shortness of breath and wheezing.   Cardiovascular: Positive for chest pain. Negative for palpitations and leg swelling.  Gastrointestinal: Positive for nausea and abdominal pain. Negative for vomiting and diarrhea.  Musculoskeletal:  Negative for back pain and joint swelling.  Skin: Negative for color change and wound.  Neurological: Negative for dizziness, syncope, weakness, numbness and headaches.    Allergies  Codeine  Home Medications   Current Outpatient Rx  Name Route Sig Dispense Refill  . ALENDRONATE SODIUM 70 MG PO TABS Oral Take 70 mg by mouth every 7 (seven) days. Take with a full glass of water on an empty stomach on Fridays.    . AMLODIPINE BESYLATE 10 MG PO TABS Oral Take 10 mg by mouth daily.    . BUPROPION HCL ER (XL) 150 MG PO TB24 Oral Take 450 mg by mouth every morning.    [name] CARVEDILOL 6.25 MG PO TABS Oral Take 6.25 mg by mouth 2 (two) times daily with a meal.    . CHOLESTYRAMINE 4 GM/DOSE PO POWD Oral Take 4 g by mouth daily as needed. As needed to control bowel movement.    [name] CLONIDINE HCL 0.1 MG PO TABS Oral Take 0.1 mg by mouth daily as needed. As needed for systolic blood pressure greater than 180.    [name] ENSURE IMMUNE HEALTH PO LIQD Oral Take 237 mLs by mouth daily. Strawberry.    . FENTANYL 75 MCG/HR TD PT72 Transdermal Place 1 patch onto the skin every 3 (three) days.    . FUROSEMIDE 40 MG PO TABS Oral Take 40 mg by mouth daily.    [name] HYDROXYZINE PAMOATE 25 MG  PO CAPS Oral Take 25 mg by mouth 2 (two) times daily. At 3pm and at bedtime.    [name] LATANOPROST 0.005 % OP SOLN Both Eyes Place 1 drop into both eyes at bedtime.    [name] LOPERAMIDE HCL 2 MG PO TABS Oral Take 2 mg by mouth See admin instructions. 1 tablet every 2 hours as needed for diarrhea (max 16mg Luvenia Heller)    . LORAZEPAM 1 MG PO TABS Oral Take 1 mg by mouth See admin instructions. At 6am and  2pm and at bedtime    . MECLIZINE HCL 25 MG PO TABS Oral Take 25 mg by mouth 3 (three) times daily as needed. For dizziness.    . METHOCARBAMOL 750 MG PO TABS Oral Take 750 mg by mouth 4 (four) times daily as needed. For pain.    Latina Craver SENIOR/ANTIOXIDANT PO Oral Take 1 tablet by mouth daily.    [name] OMEPRAZOLE 20 MG PO CPDR Oral Take 20 mg by mouth 2  (two) times daily.    . OXYBUTYNIN CHLORIDE ER 5 MG PO TB24 Oral Take 5 mg by mouth daily.    . OXYCODONE HCL 5 MG PO CAPS Oral Take 5 mg by mouth every 4 (four) hours as needed. For mild to moderate pain.    [name] POLYETHYLENE GLYCOL 3350 PO PACK Oral Take 17 g by mouth daily as needed. For constipation.    [name] PROMETHAZINE HCL 25 MG PO TABS Oral Take 25 mg by mouth every 6 (six) hours as needed. For nausea.    [name] QUETIAPINE FUMARATE 25 MG PO TABS Oral Take by mouth See admin instructions. Take 1 tablet by mouth at 8am and 2 tablets by mouth at bedtime.    [name] ROPINIROLE HCL 1 MG PO TABS Oral Take 1 mg by mouth daily. 1 to 3 hours before bedtime for RLS.    [name] TRAMADOL HCL 50 MG PO TABS Oral Take 100 mg by mouth every 6 (six) hours as needed. Pain.    [name] TRAMADOL HCL 50 MG PO TABS Oral Take 100 mg by mouth at bedtime.    [name] ZOLPIDEM TARTRATE 5 MG PO TABS Oral Take 5 mg by mouth at bedtime.     [name] PANTOPRAZOLE SODIUM 20 MG PO TBEC Oral Take 2 tablets (40 mg total) by mouth daily. 30 tablet 0    BP 132/65  Pulse 88  Temp(Src) 98.3 F (36.8 C) (Oral)  Resp 16  SpO2 96%  Physical Exam  Nursing note and vitals reviewed. Constitutional: She is oriented to person, place, and time. She appears well-developed and well-nourished. No distress.  HENT:  Head: Normocephalic and atraumatic.  Mouth/Throat: Oropharynx is clear and moist.  Eyes: EOM are normal. Pupils are equal, round, and reactive to light.  Neck: Normal range of motion. Neck supple.  Cardiovascular: Normal rate and regular rhythm.   Pulmonary/Chest: Effort normal and breath sounds normal. No respiratory distress. She has no wheezes. She has no rales. She exhibits no tenderness.  Abdominal: Soft. Bowel sounds are normal. There is tenderness (Pt symptoms are replicated by deep palpation of her epigastrum). There is no rebound and no guarding.  Musculoskeletal: Normal range of motion. She exhibits no edema and no tenderness.  Neurological: She is  alert and oriented to person, place, and time.  Skin: Skin is warm and dry. No rash noted. No erythema.  Psychiatric: She has a normal mood and affect. Her behavior is normal.    ED Course  Procedures (including critical  care time)  Labs Reviewed  CBC - Abnormal; Notable for the following:    RBC 3.55 (*)    Hemoglobin 11.0 (*)    HCT 32.9 (*)    All other components within normal limits  COMPREHENSIVE METABOLIC PANEL - Abnormal; Notable for the following:    Creatinine, Ser 1.11 (*)    GFR calc non Af Amer 48 (*)    GFR calc Af Amer 56 (*)    All other components within normal limits  URINALYSIS, ROUTINE W REFLEX MICROSCOPIC - Abnormal; Notable for the following:    Leukocytes, UA TRACE (*)    All other components within normal limits  DIFFERENTIAL  LIPASE, BLOOD  CARDIAC PANEL(CRET KIN+CKTOT+MB+TROPI)  URINE MICROSCOPIC-ADD ON  POCT I-STAT TROPONIN I  LAB REPORT - SCANNED   Dg Chest 2 View  04/18/2011  *RADIOLOGY REPORT*  Clinical Data: Chest pain  CHEST - 2 VIEW  Comparison: 07/14/2010  Findings: Lungs are hyperexpanded. Interstitial markings are diffusely coarsened with chronic features. There is pulmonary vascular congestion without overt pulmonary edema.  No focal airspace consolidation or overt airspace pulmonary edema. The cardiopericardial silhouette is enlarged. Bones are diffusely demineralized. Telemetry leads overlie the chest.  IMPRESSION: Cardiomegaly with emphysema and vascular congestion.  Original Report Authenticated By: ERIC A. MANSELL, M.D.     1. GERD (gastroesophageal reflux disease)      Date: 04/18/2011  Rate:78  Rhythm: normal sinus rhythm  QRS Axis: normal  Intervals: QRS prolonged  ST/T Wave abnormalities: nonspecific ST changes  Conduction Disutrbances:left bundle branch block  Narrative Interpretation:   Old EKG Reviewed: unchanged    MDM   Pt states she is feeling much better. She is stating she does not want to go back to the nursing  home because they are trying to kill her. Granddaughter in the room states that this paranoia is common with her dementia.        Loren Racer, MD 04/20/11 [phone]

## 2011-04-18 NOTE — ED Notes (Signed)
Family notified per phone of pt request for them to come to hospital.

## 2011-04-18 NOTE — ED Notes (Signed)
Per EMS, pt lives at an ALF, c/o CP after smelling "a strange odor" in her room. Per EMS, pt was very anxious and crying upon arrival, all VS were normal with BP of 134/78, HR 86 RR 24.

## 2011-04-19 LAB — POCT I-STAT TROPONIN I

## 2011-04-19 NOTE — ED Provider Notes (Signed)
  Physical Exam  BP 132/65  Pulse 88  Temp(Src) 98.3 F (36.8 C) (Oral)  Resp 16  SpO2 96%  Physical Exam  ED Course  Procedures  MDM Patient accepted at change of shift, chest pain, has improved significant after medications, 2 sets of negative troponins, will have followup with family doctor, unlikely cardiac etiology.  Patient is concerned about an awful smell that she smells in her room at the nursing home, she states that she will pursue this with nursing home administration      Vida Roller, MD 04/19/11 [phone]

## 2011-04-19 NOTE — Discharge Instructions (Signed)
Your heart tests were normal - you need to call your doctor today for follow up in the next 24 hours - you may need a stress test as an outpatient if you continue to have symptoms - return to the ER for severe or worsening symptoms.  Diet for GERD or PUD Nutrition therapy can help ease the discomfort of gastroesophageal reflux disease (GERD) and peptic ulcer disease (PUD).  HOME CARE INSTRUCTIONS   Eat your meals slowly, in a relaxed setting.   Eat 5 to 6 small meals per day.   If a food causes distress, stop eating it for a period of time.  FOODS TO AVOID  Coffee, regular or decaffeinated.   Cola beverages, regular or low calorie.   Tea, regular or decaffeinated.   Pepper.   Cocoa.   High fat foods, including meats.   Butter, margarine, hydrogenated oil (trans fats).   Peppermint or spearmint (if you have GERD).   Fruits and vegetables if not tolerated.   Alcohol.   Nicotine (smoking or chewing). This is one of the most potent stimulants to acid production in the gastrointestinal tract.   Any food that seems to aggravate your condition.  If you have questions regarding your diet, ask your caregiver or a registered dietitian. TIPS  Lying flat may make symptoms worse. Keep the head of your bed raised 6 to 9 inches (15 to 23 cm) by using a foam wedge or blocks under the legs of the bed.   Do not lay down until 3 hours after eating a meal.   Daily physical activity may help reduce symptoms.  MAKE SURE YOU:   Understand these instructions.   Will watch your condition.   Will get help right away if you are not doing well or get worse.  Document Released: 01/31/2005 Document Revised: 01/20/2011 Document Reviewed: 12/17/2010 [facility] Patient Information 2012 [place], [place].

## 2011-04-24 ENCOUNTER — Encounter (HOSPITAL_COMMUNITY): Payer: Self-pay

## 2011-04-24 ENCOUNTER — Emergency Department (HOSPITAL_COMMUNITY)
Admission: EM | Admit: 2011-04-24 | Discharge: 2011-04-24 | Disposition: A | Discharge status: Home / Self Care | Payer: PRIVATE HEALTH INSURANCE | Attending: Emergency Medicine | Admitting: Emergency Medicine

## 2011-04-24 DIAGNOSIS — Z79899 Other long term (current) drug therapy: Secondary | ICD-10-CM | POA: Insufficient documentation

## 2011-04-24 DIAGNOSIS — Z8739 Personal history of other diseases of the musculoskeletal system and connective tissue: Secondary | ICD-10-CM | POA: Insufficient documentation

## 2011-04-24 DIAGNOSIS — F0390 Unspecified dementia without behavioral disturbance: Secondary | ICD-10-CM | POA: Insufficient documentation

## 2011-04-24 DIAGNOSIS — R51 Headache: Secondary | ICD-10-CM | POA: Insufficient documentation

## 2011-04-24 HISTORY — DX: Essential (primary) hypertension: I10

## 2011-04-24 HISTORY — DX: Reserved for inherently not codable concepts without codable children: IMO0001

## 2011-04-24 HISTORY — DX: Encounter for other specified aftercare: Z51.89

## 2011-04-24 HISTORY — DX: Disorder of kidney and ureter, unspecified: N28.9

## 2011-04-24 HISTORY — DX: Unspecified convulsions: R56.9

## 2011-04-24 HISTORY — DX: Unspecified osteoarthritis, unspecified site: M19.90

## 2011-04-24 MED ORDER — ACETAMINOPHEN 325 MG PO TABS
650.0000 mg | ORAL_TABLET | Freq: Once | ORAL | Status: AC
  Administered 2011-04-24: 650 mg via ORAL
  Filled 2011-04-24: qty 2

## 2011-04-24 NOTE — ED Provider Notes (Signed)
History     CSN: [id number]  Arrival date & time 04/24/11  1237   First MD Initiated Contact with Patient 04/24/11 1241      Chief Complaint  Patient presents with  . Headache    (Consider location/radiation/quality/duration/timing/severity/associated sxs/prior treatment) Patient is a 76 y.o. female presenting with headaches. The history is provided by the patient and the EMS personnel.  Headache  Pertinent negatives include no fever, no shortness of breath and no vomiting.  pt had c/o headache earlier at [facility]. Pt noted to have hx of similar headaches. Pt states on and off headaches for months. Dull, bilateral, gradual onset. No specific exacerbating or alleviating factors. No neck pain or stiffness. No sinus drainage or pain. No recent trauma or fall. No faintness or dizziness. No eye pain or change in vision. No change in speech. Denies any extremity or facial numbness or weakness. No fever or chills. States just wanted to get checked out today. Headache currently completely resolved. No pain. Normal appetite. No nv.     Past Medical History  Diagnosis Date  . Dementia   . Arthritis   . Blood transfusion   . Hypertension   . Renal disorder   . Seizures     History reviewed. No pertinent past surgical history.  No family history on file.  History  Substance Use Topics  . Smoking status: Never Smoker   . Smokeless tobacco: Not on file  . Alcohol Use: No    OB History    Grav Para Term Preterm Abortions TAB SAB Ect Mult Living                  Review of Systems  Constitutional: Negative for fever and chills.  HENT: Negative for neck pain and neck stiffness.   Eyes: Negative for pain, redness and visual disturbance.  Respiratory: Negative for shortness of breath.   Cardiovascular: Negative for chest pain.  Gastrointestinal: Negative for vomiting and abdominal pain.  Genitourinary: Negative for flank pain.  Musculoskeletal: Negative for back pain.  Skin: Negative  for rash.  Neurological: Positive for headaches. Negative for dizziness, syncope, speech difficulty, weakness, light-headedness and numbness.  Hematological: Does not bruise/bleed easily.  Psychiatric/Behavioral: Negative for agitation.    Allergies  Codeine  Home Medications   Current Outpatient Rx  Name Route Sig Dispense Refill  . ALENDRONATE SODIUM 70 MG PO TABS Oral Take 70 mg by mouth every 7 (seven) days. Take with a full glass of water on an empty stomach on Fridays.    . AMLODIPINE BESYLATE 10 MG PO TABS Oral Take 10 mg by mouth daily.    . BUPROPION HCL ER (XL) 150 MG PO TB24 Oral Take 450 mg by mouth every morning.    [name] CARVEDILOL 6.25 MG PO TABS Oral Take 6.25 mg by mouth 2 (two) times daily with a meal.    . CHOLESTYRAMINE 4 GM/DOSE PO POWD Oral Take 4 g by mouth daily as needed. As needed to control bowel movement.    [name] CLONIDINE HCL 0.1 MG PO TABS Oral Take 0.1 mg by mouth daily as needed. As needed for systolic blood pressure greater than 180.    [name] ENSURE IMMUNE HEALTH PO LIQD Oral Take 237 mLs by mouth daily. Strawberry.    . FENTANYL 75 MCG/HR TD PT72 Transdermal Place 1 patch onto the skin every 3 (three) days.    . FUROSEMIDE 40 MG PO TABS Oral Take 40 mg by mouth daily.    [name]  HYDROXYZINE PAMOATE 25 MG PO CAPS Oral Take 25 mg by mouth 2 (two) times daily. At 3pm and at bedtime.    [name] LATANOPROST 0.005 % OP SOLN Both Eyes Place 1 drop into both eyes at bedtime.    [name] LOPERAMIDE HCL 2 MG PO TABS Oral Take 2 mg by mouth See admin instructions. 1 tablet every 2 hours as needed for diarrhea (max 16mg Luvenia Heller)    . LORAZEPAM 1 MG PO TABS Oral Take 1 mg by mouth See admin instructions. At 6am and  2pm and at bedtime    . MECLIZINE HCL 25 MG PO TABS Oral Take 25 mg by mouth 3 (three) times daily as needed. For dizziness.    . METHOCARBAMOL 750 MG PO TABS Oral Take 750 mg by mouth 4 (four) times daily as needed. For pain.    Latina Craver SENIOR/ANTIOXIDANT PO Oral Take 1 tablet by  mouth daily.    [name] OMEPRAZOLE 20 MG PO CPDR Oral Take 20 mg by mouth 2 (two) times daily.    . OXYBUTYNIN CHLORIDE ER 5 MG PO TB24 Oral Take 5 mg by mouth daily.    . OXYCODONE HCL 5 MG PO CAPS Oral Take 5 mg by mouth every 4 (four) hours as needed. For mild to moderate pain.    [name] PANTOPRAZOLE SODIUM 20 MG PO TBEC Oral Take 2 tablets (40 mg total) by mouth daily. 30 tablet 0  . POLYETHYLENE GLYCOL 3350 PO PACK Oral Take 17 g by mouth daily as needed. For constipation.    [name] PROMETHAZINE HCL 25 MG PO TABS Oral Take 25 mg by mouth every 6 (six) hours as needed. For nausea.    [name] QUETIAPINE FUMARATE 25 MG PO TABS Oral Take by mouth See admin instructions. Take 1 tablet by mouth at 8am and 2 tablets by mouth at bedtime.    [name] ROPINIROLE HCL 1 MG PO TABS Oral Take 1 mg by mouth daily. 1 to 3 hours before bedtime for RLS.    [name] TRAMADOL HCL 50 MG PO TABS Oral Take 100 mg by mouth every 6 (six) hours as needed. Pain.    [name] TRAMADOL HCL 50 MG PO TABS Oral Take 100 mg by mouth at bedtime.    [name] ZOLPIDEM TARTRATE 5 MG PO TABS Oral Take 5 mg by mouth at bedtime.       BP 104/58  Pulse 74  Temp(Src) 98 F (36.7 C) (Oral)  Resp 18  SpO2 99%  Physical Exam  Nursing note and vitals reviewed. Constitutional: She is oriented to person, place, and time. She appears well-developed and well-nourished. No distress.  HENT:  Head: Atraumatic.       No sinus or temporal tenderness.   Eyes: Conjunctivae and EOM are normal. Pupils are equal, round, and reactive to light. No scleral icterus.  Neck: Normal range of motion. Neck supple. No tracheal deviation present.       No stiffness or rigidity.  Cardiovascular: Normal rate, regular rhythm, normal heart sounds and intact distal pulses.  Exam reveals no gallop and no friction rub.   No murmur heard. Pulmonary/Chest: Effort normal and breath sounds normal. No respiratory distress.  Abdominal: Soft. Normal appearance and bowel sounds are normal. She exhibits no  distension. There is no tenderness.  Genitourinary:       No cva tenderness.  Musculoskeletal: Normal range of motion. She exhibits no edema and no tenderness.  Neurological: She is alert and oriented to person, place, and time.  No cranial nerve deficit.       No facial asymmetry or droop. Motor intact bil. No pronator drift.   Skin: Skin is warm and dry. No rash noted.  Psychiatric: She has a normal mood and affect.    ED Course  Procedures (including critical care time)     MDM  Reviewed prior charts, pt w recent ua, blood work in past couple days that appears unremarkable.  Pt notes earlier headache similar to prior headaches. Denies any severe head pain or any 'worst' headache.  No pain or headache currently.    Recheck pt, content, alert, taking po. Continues to deny headache or other c/o.      Suzi Roots, MD 04/24/11 1331

## 2011-04-24 NOTE — ED Notes (Signed)
Pt has had a headache for 2 months.  Pt was prescribed meds for HA which she has been taking.  Pt reports that HA comes and goes.  Pt sought treatment today because her HA is on one side (right side) of her head instead of both sides.

## 2011-04-24 NOTE — Discharge Instructions (Signed)
Take tylenol/advil as need. Follow up with primary care doctor in coming week. Return to ER if worse, severe headache, persistent vomiting, fevers, other concern.    Headache, General, Unknown Cause The specific cause of your headache may not have been found today. There are many causes and types of headache. A few common ones are:  Tension headache.   Migraine.   Infections (examples: dental and sinus infections).   Bone and/or joint problems in the neck or jaw.   Depression.   Eye problems.  These headaches are not life threatening.  Headaches can sometimes be diagnosed by a patient history and a physical exam. Sometimes, lab and imaging studies (such as x-ray and/or CT scan) are used to rule out more serious problems. In some cases, a spinal tap (lumbar puncture) may be requested. There are many times when your exam and tests may be normal on the first visit even when there is a serious problem causing your headaches. Because of that, it is very important to follow up with your doctor or local clinic for further evaluation. FINDING OUT THE RESULTS OF TESTS  If a radiology test was performed, a radiologist will review your results.   You will be contacted by the emergency department or your physician if any test results require a change in your treatment plan.   Not all test results may be available during your visit. If your test results are not back during the visit, make an appointment with your caregiver to find out the results. Do not assume everything is normal if you have not heard from your caregiver or the medical facility. It is important for you to follow up on all of your test results.  HOME CARE INSTRUCTIONS   Keep follow-up appointments with your caregiver, or any specialist referral.   Only take over-the-counter or prescription medicines for pain, discomfort, or fever as directed by your caregiver.   Biofeedback, massage, or other relaxation techniques may be  helpful.   Ice packs or heat applied to the head and neck can be used. Do this three to four times per day, or as needed.   Call your doctor if you have any questions or concerns.   If you smoke, you should quit.  SEEK MEDICAL CARE IF:   You develop problems with medications prescribed.   You do not respond to or obtain relief from medications.   You have a change from the usual headache.   You develop nausea or vomiting.  SEEK IMMEDIATE MEDICAL CARE IF:   If your headache becomes severe.   You have an unexplained oral temperature above 102 F (38.9 C), or as your caregiver suggests.   You have a stiff neck.   You have loss of vision.   You have muscular weakness.   You have loss of muscular control.   You develop severe symptoms different from your first symptoms.   You start losing your balance or have trouble walking.   You feel faint or pass out.  MAKE SURE YOU:   Understand these instructions.   Will watch your condition.   Will get help right away if you are not doing well or get worse.  Document Released: 01/31/2005 Document Revised: 01/20/2011 Document Reviewed: 09/20/2007 [facility] Patient Information 2012 [place], [place].

## 2011-04-24 NOTE — ED Notes (Addendum)
Called [facility] for transport back to assisted living

## 2011-04-24 NOTE — ED Notes (Signed)
Pt already strapped in by [facility] on stretcher and unable to sign record.

## 2011-04-24 NOTE — ED Notes (Signed)
Fentanyl patch to right upper back

## 2011-05-03 ENCOUNTER — Other Ambulatory Visit: Payer: Self-pay | Admitting: Internal Medicine

## 2011-05-03 DIAGNOSIS — Z1231 Encounter for screening mammogram for malignant neoplasm of breast: Secondary | ICD-10-CM

## 2011-05-23 ENCOUNTER — Emergency Department (HOSPITAL_COMMUNITY)
Admission: EM | Admit: 2011-05-23 | Discharge: 2011-05-23 | Disposition: A | Discharge status: Skilled Nursing Facility | Payer: PRIVATE HEALTH INSURANCE | Attending: Emergency Medicine | Admitting: Emergency Medicine

## 2011-05-23 ENCOUNTER — Emergency Department (HOSPITAL_COMMUNITY): Payer: PRIVATE HEALTH INSURANCE

## 2011-05-23 ENCOUNTER — Encounter (HOSPITAL_COMMUNITY): Payer: Self-pay | Admitting: Emergency Medicine

## 2011-05-23 DIAGNOSIS — R4182 Altered mental status, unspecified: Secondary | ICD-10-CM | POA: Insufficient documentation

## 2011-05-23 DIAGNOSIS — M129 Arthropathy, unspecified: Secondary | ICD-10-CM | POA: Insufficient documentation

## 2011-05-23 DIAGNOSIS — S8391XA Sprain of unspecified site of right knee, initial encounter: Secondary | ICD-10-CM

## 2011-05-23 DIAGNOSIS — W19XXXA Unspecified fall, initial encounter: Secondary | ICD-10-CM

## 2011-05-23 DIAGNOSIS — G40909 Epilepsy, unspecified, not intractable, without status epilepticus: Secondary | ICD-10-CM | POA: Insufficient documentation

## 2011-05-23 DIAGNOSIS — W07XXXA Fall from chair, initial encounter: Secondary | ICD-10-CM | POA: Insufficient documentation

## 2011-05-23 DIAGNOSIS — F068 Other specified mental disorders due to known physiological condition: Secondary | ICD-10-CM | POA: Insufficient documentation

## 2011-05-23 DIAGNOSIS — IMO0002 Reserved for concepts with insufficient information to code with codable children: Secondary | ICD-10-CM | POA: Insufficient documentation

## 2011-05-23 DIAGNOSIS — I10 Essential (primary) hypertension: Secondary | ICD-10-CM | POA: Insufficient documentation

## 2011-05-23 DIAGNOSIS — Y9210 Unspecified residential institution as the place of occurrence of the external cause: Secondary | ICD-10-CM | POA: Insufficient documentation

## 2011-05-23 DIAGNOSIS — Z79899 Other long term (current) drug therapy: Secondary | ICD-10-CM | POA: Insufficient documentation

## 2011-05-23 DIAGNOSIS — M25569 Pain in unspecified knee: Secondary | ICD-10-CM | POA: Insufficient documentation

## 2011-05-23 LAB — BASIC METABOLIC PANEL
CO2: 29 mEq/L (ref 19–32)
GFR calc non Af Amer: 42 mL/min — ABNORMAL LOW (ref >90)
Glucose, Bld: 94 mg/dL (ref 70–99)
Potassium: 4 mEq/L (ref 3.5–5.1)
Sodium: 140 mEq/L (ref 135–145)

## 2011-05-23 LAB — DIFFERENTIAL
Lymphocytes Relative: 41 % (ref 12–46)
Lymphs Abs: 2 10*3/uL (ref 0.7–4.0)
Neutrophils Relative %: 45 % (ref 43–77)

## 2011-05-23 LAB — URINALYSIS, ROUTINE W REFLEX MICROSCOPIC
Glucose, UA: NEGATIVE mg/dL
Hgb urine dipstick: NEGATIVE
Specific Gravity, Urine: 1.019 (ref 1.005–1.030)
Urobilinogen, UA: 0.2 mg/dL (ref 0.0–1.0)

## 2011-05-23 LAB — CBC
Platelets: 301 10*3/uL (ref 150–400)
RBC: 3.3 MIL/uL — ABNORMAL LOW (ref 3.87–5.11)
WBC: 4.9 10*3/uL (ref 4.0–10.5)

## 2011-05-23 NOTE — ED Notes (Signed)
EMS brings pt from [facility], pt reports she was in a chair fell forward on her knees. Per EMS, nursing facility staff reports increased in mental status changes

## 2011-05-23 NOTE — ED Provider Notes (Addendum)
History     CSN: [id number]  Arrival date & time 05/23/11  1648   First MD Initiated Contact with Patient 05/23/11 1730      Chief Complaint  Patient presents with  . Fall    (Consider location/radiation/quality/duration/timing/severity/associated sxs/prior treatment) HPI... accidental fall from her chair today. Struck right knee. No head or neck trauma. Status post right knee replacement by Dr. Thurston Hole.  Level V caveat for dementia.  Nursing home staff reports altered mental status. the patient appears reasonably alert in ED.  no fever or chills.  Past Medical History  Diagnosis Date  . Dementia   . Arthritis   . Blood transfusion   . Hypertension   . Renal disorder   . Seizures     Past Surgical History  Procedure Date  . Joint replacement   . Breast lumpectomy   . Cholecystectomy     History reviewed. No pertinent family history.  History  Substance Use Topics  . Smoking status: Never Smoker   . Smokeless tobacco: Not on file  . Alcohol Use: No    OB History    Grav Para Term Preterm Abortions TAB SAB Ect Mult Living                  Review of Systems  Unable to perform ROS: Dementia    Allergies  Codeine  Home Medications   Current Outpatient Rx  Name Route Sig Dispense Refill  . ALENDRONATE SODIUM 70 MG PO TABS Oral Take 70 mg by mouth every 7 (seven) days. Take with a full glass of water on an empty stomach on Fridays.    . AMLODIPINE BESYLATE 10 MG PO TABS Oral Take 10 mg by mouth daily.    . BUPROPION HCL ER (XL) 150 MG PO TB24 Oral Take 450 mg by mouth every morning.    [name] CARVEDILOL 6.25 MG PO TABS Oral Take 6.25 mg by mouth 2 (two) times daily with a meal.    . CHOLESTYRAMINE 4 GM/DOSE PO POWD Oral Take 4 g by mouth daily as needed. As needed to control bowel movement.    [name] CLONIDINE HCL 0.1 MG PO TABS Oral Take 0.1 mg by mouth daily as needed. As needed for systolic blood pressure greater than 180.    [name] ENSURE IMMUNE HEALTH PO LIQD Oral Take  237 mLs by mouth daily. Strawberry.    . FENTANYL 75 MCG/HR TD PT72 Transdermal Place 1 patch onto the skin every 3 (three) days.    . FUROSEMIDE 40 MG PO TABS Oral Take 40 mg by mouth daily.    [name] HYDROXYZINE PAMOATE 25 MG PO CAPS Oral Take 25 mg by mouth 2 (two) times daily. 3pm and bedtime    . LATANOPROST 0.005 % OP SOLN Both Eyes Place 1 drop into both eyes at bedtime.    [name] LOPERAMIDE HCL 2 MG PO TABS Oral Take 2 mg by mouth See admin instructions. 1 tablet every 2 hours as needed for diarrhea (max 16mg Luvenia Heller)    . LORAZEPAM 1 MG PO TABS Oral Take 0.5-1 mg by mouth daily. 1mg  at 2pm and 0.5mg  at bedtime    . MECLIZINE HCL 25 MG PO TABS Oral Take 25 mg by mouth 3 (three) times daily as needed. For dizziness.    . METHOCARBAMOL 750 MG PO TABS Oral Take 750 mg by mouth 4 (four) times daily as needed. For pain.    . ADULT MULTIVITAMIN W/MINERALS CH Oral Take 1 tablet by  mouth daily.    Latina Craver SENIOR/ANTIOXIDANT PO Oral Take 1 tablet by mouth daily.    [name] OMEPRAZOLE 20 MG PO CPDR Oral Take 20 mg by mouth 2 (two) times daily.    . OXYBUTYNIN CHLORIDE ER 5 MG PO TB24 Oral Take 5 mg by mouth daily.    . OXYCODONE HCL 5 MG PO CAPS Oral Take 5 mg by mouth every 4 (four) hours as needed. For mild to moderate pain.    [name] PANTOPRAZOLE SODIUM 20 MG PO TBEC Oral Take 40 mg by mouth daily.    [name] POLYETHYLENE GLYCOL 3350 PO PACK Oral Take 17 g by mouth daily as needed. For constipation.    [name] PROMETHAZINE HCL 25 MG PO TABS Oral Take 25 mg by mouth every 6 (six) hours as needed. For nausea.    [name] QUETIAPINE FUMARATE 100 MG PO TABS Oral Take 100 mg by mouth 2 (two) times daily.    [name] ROPINIROLE HCL 1 MG PO TABS Oral Take 1 mg by mouth daily. 1 to 3 hours before bedtime for RLS.    [name] TRAMADOL HCL 50 MG PO TABS Oral Take 100 mg by mouth every 6 (six) hours as needed. Pain.    [name] TRAMADOL HCL 50 MG PO TABS Oral Take 100 mg by mouth at bedtime.    [name] ZOLPIDEM TARTRATE 5 MG PO TABS Oral Take 5 mg by mouth at bedtime.        BP 106/56  Pulse 77  Temp(Src) 98.3 F (36.8 C) (Oral)  Resp 18  SpO2 95%  Physical Exam  Nursing note and vitals reviewed. Constitutional: She is oriented to person, place, and time. She appears well-developed and well-nourished.       Minimal confusion consistent with dementia.  Not dehydrated.  Vital signs normal  HENT:  Head: Normocephalic and atraumatic.  Eyes: Conjunctivae and EOM are normal. Pupils are equal, round, and reactive to light.  Neck: Normal range of motion. Neck supple.  Cardiovascular: Normal rate and regular rhythm.   Pulmonary/Chest: Effort normal and breath sounds normal.  Abdominal: Soft. Bowel sounds are normal.  Musculoskeletal:       Slight tenderness anterior right knee. Surgical scar vertically consistent with total knee replacement  Neurological: She is alert and oriented to person, place, and time.  Skin: Skin is warm and dry.  Psychiatric: She has a normal mood and affect.    ED Course  Procedures (including critical care time)  Labs Reviewed - No data to display Dg Knee Complete 4 Views Right  05/23/2011  *RADIOLOGY REPORT*  Clinical Data: Fall, right knee pain and swelling  RIGHT KNEE - COMPLETE 4+ VIEW  Comparison: 07/14/2010  Findings: Stable right knee arthroplasty alignment.  No fracture or significant effusion.  No significant interval change.  IMPRESSION: Stable right knee arthroplasty.  Original Report Authenticated By: Judie Petit. Ruel Favors, M.D.     1. Fall   2. Right knee sprain       MDM  Nursing home staff reports altered mental status. Patient seems alert to examiner. CT head negative. Urinalysis negative.  Plain films of right knee.  Followup primary care        Donnetta Hutching, MD 05/23/11 2133  Donnetta Hutching, MD 05/23/11 2135

## 2011-05-23 NOTE — ED Notes (Signed)
[id number]<GN> Expected date:05/23/11<BR> Expected time:<BR> Means of arrival:<BR> Comments:<BR> EMS 40 GC = fall/dloc

## 2011-05-23 NOTE — Discharge Instructions (Signed)
X-ray of knee normal.  Elevate. Ice. Followup Dr. Thurston Hole.  CT head shows no acute changes.  Patient slightly anemic.  Chemistry panel and urinalysis normal.  Follow up with primary care physician

## 2011-05-23 NOTE — ED Notes (Signed)
Patient transported to X-ray 

## 2011-05-23 NOTE — ED Notes (Signed)
Report given to Latasha at pt's nursing home

## 2011-05-23 NOTE — ED Notes (Signed)
Called [facility] for transport.  

## 2011-05-24 ENCOUNTER — Ambulatory Visit
Admission: RE | Admit: 2011-05-24 | Discharge: 2011-05-24 | Disposition: A | Discharge status: Home / Self Care | Payer: PRIVATE HEALTH INSURANCE | Source: Ambulatory Visit | Attending: Internal Medicine | Admitting: Internal Medicine

## 2011-05-24 DIAGNOSIS — Z1231 Encounter for screening mammogram for malignant neoplasm of breast: Secondary | ICD-10-CM

## 2011-05-26 ENCOUNTER — Emergency Department (HOSPITAL_COMMUNITY): Payer: PRIVATE HEALTH INSURANCE

## 2011-05-26 ENCOUNTER — Encounter (HOSPITAL_COMMUNITY): Payer: Self-pay | Admitting: *Deleted

## 2011-05-26 ENCOUNTER — Emergency Department (HOSPITAL_COMMUNITY)
Admission: EM | Admit: 2011-05-26 | Discharge: 2011-05-26 | Disposition: A | Discharge status: Home / Self Care | Payer: PRIVATE HEALTH INSURANCE | Attending: Emergency Medicine | Admitting: Emergency Medicine

## 2011-05-26 DIAGNOSIS — R4182 Altered mental status, unspecified: Secondary | ICD-10-CM | POA: Insufficient documentation

## 2011-05-26 DIAGNOSIS — I509 Heart failure, unspecified: Secondary | ICD-10-CM | POA: Insufficient documentation

## 2011-05-26 DIAGNOSIS — N289 Disorder of kidney and ureter, unspecified: Secondary | ICD-10-CM | POA: Insufficient documentation

## 2011-05-26 DIAGNOSIS — F0390 Unspecified dementia without behavioral disturbance: Secondary | ICD-10-CM | POA: Insufficient documentation

## 2011-05-26 DIAGNOSIS — I10 Essential (primary) hypertension: Secondary | ICD-10-CM | POA: Insufficient documentation

## 2011-05-26 DIAGNOSIS — R0789 Other chest pain: Secondary | ICD-10-CM | POA: Insufficient documentation

## 2011-05-26 HISTORY — DX: Unspecified psychosis not due to a substance or known physiological condition: F29

## 2011-05-26 LAB — COMPREHENSIVE METABOLIC PANEL
ALT: 13 U/L (ref 0–35)
Alkaline Phosphatase: 103 U/L (ref 39–117)
CO2: 30 mEq/L (ref 19–32)
Calcium: 9.5 mg/dL (ref 8.4–10.5)
Chloride: 103 mEq/L (ref 96–112)
GFR calc Af Amer: 35 mL/min — ABNORMAL LOW (ref >90)
GFR calc non Af Amer: 30 mL/min — ABNORMAL LOW (ref >90)
Glucose, Bld: 112 mg/dL — ABNORMAL HIGH (ref 70–99)
Potassium: 4 mEq/L (ref 3.5–5.1)
Sodium: 142 mEq/L (ref 135–145)
Total Bilirubin: 0.2 mg/dL — ABNORMAL LOW (ref 0.3–1.2)

## 2011-05-26 LAB — URINALYSIS, ROUTINE W REFLEX MICROSCOPIC
Bilirubin Urine: NEGATIVE
Hgb urine dipstick: NEGATIVE
Ketones, ur: NEGATIVE mg/dL
Nitrite: NEGATIVE
Protein, ur: NEGATIVE mg/dL
Urobilinogen, UA: 0.2 mg/dL (ref 0.0–1.0)

## 2011-05-26 LAB — DIFFERENTIAL
Eosinophils Relative: 2 % (ref 0–5)
Lymphocytes Relative: 27 % (ref 12–46)
Lymphs Abs: 1.5 10*3/uL (ref 0.7–4.0)
Neutro Abs: 3.5 10*3/uL (ref 1.7–7.7)

## 2011-05-26 LAB — CBC
MCV: 92.2 fL (ref 78.0–100.0)
Platelets: 304 10*3/uL (ref 150–400)
RBC: 3.45 MIL/uL — ABNORMAL LOW (ref 3.87–5.11)
WBC: 5.6 10*3/uL (ref 4.0–10.5)

## 2011-05-26 MED ORDER — FUROSEMIDE 10 MG/ML IJ SOLN
80.0000 mg | Freq: Once | INTRAMUSCULAR | Status: AC
  Administered 2011-05-26: 80 mg via INTRAVENOUS
  Filled 2011-05-26: qty 8

## 2011-05-26 MED ORDER — FUROSEMIDE 10 MG/ML IJ SOLN
20.0000 mg | Freq: Once | INTRAMUSCULAR | Status: DC
  Filled 2011-05-26: qty 2
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
## 2011-05-26 NOTE — Discharge Instructions (Signed)
A definite cause for your presentation was not demonstrated, however your labs suggested that your multiple medical problems, specifically her heart failure, your renal dysfunction, and your psychiatric disorder are all contributing.  It is important to call your medication as directed.  Please be sure to speak with your physician tomorrow to insure appropriate medication use.  Please return for any concerning changes in your condition.
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
## 2011-05-26 NOTE — ED Provider Notes (Signed)
History     CSN: [id number]  Arrival date & time 05/26/11  1651   First MD Initiated Contact with Patient 05/26/11 1715      Chief Complaint  Patient presents with  . Altered Mental Status    (Consider location/radiation/quality/duration/timing/severity/associated sxs/prior treatment) HPI The patient presents from her nursing home to 2 staff concerns over increasing atypical behavior.  The patient does have a history of psychosis.  Staff notes that over the past week patient has had increasingly disorganized speech, decreasing interactivity, and been less directable. The patient is incapable of providing any useful information regarding the history of present illness.  She perseverates on statements, speaks nonsensically, but she does deny any focal pain.  Past Medical History  Diagnosis Date  . Dementia   . Arthritis   . Blood transfusion   . Hypertension   . Renal disorder   . Seizures   . Psychosis     Past Surgical History  Procedure Date  . Joint replacement   . Breast lumpectomy   . Cholecystectomy     No family history on file.  History  Substance Use Topics  . Smoking status: Never Smoker   . Smokeless tobacco: Not on file  . Alcohol Use: No    OB History    Grav Para Term Preterm Abortions TAB SAB Ect Mult Living                  Review of Systems  Unable to perform ROS: Dementia    Allergies  Codeine  Home Medications   Current Outpatient Rx  Name Route Sig Dispense Refill  . ALENDRONATE SODIUM 70 MG PO TABS Oral Take 70 mg by mouth every 7 (seven) days. Take with a full glass of water on an empty stomach on Fridays.    . AMLODIPINE BESYLATE 10 MG PO TABS Oral Take 10 mg by mouth daily.    . BUPROPION HCL ER (XL) 150 MG PO TB24 Oral Take 450 mg by mouth every morning.    [name] CARVEDILOL 6.25 MG PO TABS Oral Take 6.25 mg by mouth 2 (two) times daily with a meal.    . CHOLESTYRAMINE 4 GM/DOSE PO POWD Oral Take 4 g by mouth daily as needed. As  needed to control bowel movement.    [name] CLONIDINE HCL 0.1 MG PO TABS Oral Take 0.1 mg by mouth daily as needed. As needed for systolic blood pressure greater than 180.    [name] ENSURE IMMUNE HEALTH PO LIQD Oral Take 237 mLs by mouth daily. Strawberry.    . FENTANYL 75 MCG/HR TD PT72 Transdermal Place 1 patch onto the skin every 3 (three) days.    . FUROSEMIDE 40 MG PO TABS Oral Take 40 mg by mouth daily.    [name] HYDROXYZINE PAMOATE 25 MG PO CAPS Oral Take 25 mg by mouth 2 (two) times daily. 3pm and bedtime    . LATANOPROST 0.005 % OP SOLN Both Eyes Place 1 drop into both eyes at bedtime.    [name] LOPERAMIDE HCL 2 MG PO TABS Oral Take 2 mg by mouth See admin instructions. 1 tablet every 2 hours as needed for diarrhea (max 16mg Luvenia Heller)    . LORAZEPAM 1 MG PO TABS Oral Take 0.5-1 mg by mouth daily. 1mg  at 2pm and 0.5mg  at bedtime    . MECLIZINE HCL 25 MG PO TABS Oral Take 25 mg by mouth 3 (three) times daily as needed. For dizziness.    . METHOCARBAMOL 750  MG PO TABS Oral Take 750 mg by mouth 4 (four) times daily as needed. For pain.    . ADULT MULTIVITAMIN W/MINERALS CH Oral Take 1 tablet by mouth daily.    Latina Craver SENIOR/ANTIOXIDANT PO Oral Take 1 tablet by mouth daily.    [name] OMEPRAZOLE 20 MG PO CPDR Oral Take 20 mg by mouth 2 (two) times daily.    . OXYBUTYNIN CHLORIDE ER 5 MG PO TB24 Oral Take 5 mg by mouth daily.    . OXYCODONE HCL 5 MG PO CAPS Oral Take 5 mg by mouth every 4 (four) hours as needed. For mild to moderate pain.    [name] PANTOPRAZOLE SODIUM 20 MG PO TBEC Oral Take 40 mg by mouth daily.    [name] POLYETHYLENE GLYCOL 3350 PO PACK Oral Take 17 g by mouth daily as needed. For constipation.    [name] PROMETHAZINE HCL 25 MG PO TABS Oral Take 25 mg by mouth every 6 (six) hours as needed. For nausea.    [name] QUETIAPINE FUMARATE 100 MG PO TABS Oral Take 100 mg by mouth 2 (two) times daily.    [name] ROPINIROLE HCL 1 MG PO TABS Oral Take 1 mg by mouth daily. 1 to 3 hours before bedtime for RLS.    [name] TRAMADOL HCL 50 MG PO  TABS Oral Take 100 mg by mouth every 6 (six) hours as needed. Pain.    [name] TRAMADOL HCL 50 MG PO TABS Oral Take 100 mg by mouth at bedtime.    [name] ZOLPIDEM TARTRATE 5 MG PO TABS Oral Take 5 mg by mouth at bedtime.       BP 128/63  Pulse 84  Temp 98.5 F (36.9 C)  Resp 20  SpO2 97%  Physical Exam  Nursing note and vitals reviewed. Constitutional: She is oriented to person, place, and time. She appears well-developed and well-nourished. No distress.  HENT:  Head: Normocephalic and atraumatic.  Eyes: Conjunctivae and EOM are normal.  Cardiovascular: Normal rate and regular rhythm.   Pulmonary/Chest: Effort normal and breath sounds normal. No stridor. No respiratory distress.  Abdominal: She exhibits no distension.  Musculoskeletal: She exhibits no edema.  Neurological: She is alert and oriented to person, place, and time. No cranial nerve deficit.  Skin: Skin is warm and dry.  Psychiatric: She has a normal mood and affect.       Disconnected, perseverant speech with paranoid thoughts.    ED Course  Procedures (including critical care time)   Labs Reviewed  CBC  DIFFERENTIAL  COMPREHENSIVE METABOLIC PANEL  URINALYSIS, ROUTINE W REFLEX MICROSCOPIC   No results found.   No diagnosis found.  Cardiac: 85 sr - normal  Pulse ox 98% ra - normal   Date: 05/26/2011  Rate: 83  Rhythm: normal sinus rhythm  QRS Axis: left  Intervals: normal  ST/T Wave abnormalities: normal  Conduction Disutrbances:left bundle branch block  Narrative Interpretation:   Old EKG Reviewed: unchanged  ABNORMAL  I reviewed ct, cxr  MDM  This elderly female with dementia presents from her nursing home because concerns of increasing psychosis.  On my exam the patient is in no distress with unremarkable vital signs, denial of any pain or discomfort.  The patient's labs suggest worsening heart failure and renal function.  The remainder of her labs are relatively reassuring.  Given these develop, the  patient was provided bolus dose of Lasix.  She is stable for discharge, though she will require close followup and medication monitoring.  Gerhard Munch, MD 05/26/11 2003
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
## 2011-05-26 NOTE — ED Notes (Signed)
[facility] here to transport pt back to [facility].

## 2011-05-28 ENCOUNTER — Emergency Department (HOSPITAL_COMMUNITY)
Admission: EM | Admit: 2011-05-28 | Discharge: 2011-06-01 | Disposition: A | Discharge status: Skilled Nursing Facility | Payer: PRIVATE HEALTH INSURANCE | Attending: Emergency Medicine | Admitting: Emergency Medicine

## 2011-05-28 ENCOUNTER — Encounter (HOSPITAL_COMMUNITY): Payer: Self-pay | Admitting: *Deleted

## 2011-05-28 DIAGNOSIS — R569 Unspecified convulsions: Secondary | ICD-10-CM | POA: Insufficient documentation

## 2011-05-28 DIAGNOSIS — F0390 Unspecified dementia without behavioral disturbance: Secondary | ICD-10-CM | POA: Insufficient documentation

## 2011-05-28 DIAGNOSIS — I10 Essential (primary) hypertension: Secondary | ICD-10-CM | POA: Insufficient documentation

## 2011-05-28 DIAGNOSIS — R443 Hallucinations, unspecified: Secondary | ICD-10-CM | POA: Insufficient documentation

## 2011-05-28 LAB — COMPREHENSIVE METABOLIC PANEL
Alkaline Phosphatase: 105 U/L (ref 39–117)
BUN: 27 mg/dL — ABNORMAL HIGH (ref 6–23)
Creatinine, Ser: 2.31 mg/dL — ABNORMAL HIGH (ref 0.50–1.10)
GFR calc Af Amer: 23 mL/min — ABNORMAL LOW (ref >90)
Glucose, Bld: 101 mg/dL — ABNORMAL HIGH (ref 70–99)
Potassium: 3.3 mEq/L — ABNORMAL LOW (ref 3.5–5.1)
Total Bilirubin: 0.2 mg/dL — ABNORMAL LOW (ref 0.3–1.2)
Total Protein: 7.4 g/dL (ref 6.0–8.3)

## 2011-05-28 LAB — DIFFERENTIAL
Eosinophils Absolute: 0.1 10*3/uL (ref 0.0–0.7)
Lymphs Abs: 2 10*3/uL (ref 0.7–4.0)
Monocytes Absolute: 0.4 10*3/uL (ref 0.1–1.0)
Monocytes Relative: 6 % (ref 3–12)
Neutrophils Relative %: 63 % (ref 43–77)

## 2011-05-28 LAB — CBC
HCT: 33.7 % — ABNORMAL LOW (ref 36.0–46.0)
Hemoglobin: 11.2 g/dL — ABNORMAL LOW (ref 12.0–15.0)
MCH: 30.2 pg (ref 26.0–34.0)
MCV: 90.8 fL (ref 78.0–100.0)
RBC: 3.71 MIL/uL — ABNORMAL LOW (ref 3.87–5.11)

## 2011-05-28 MED ORDER — LORAZEPAM 1 MG PO TABS
0.5000 mg | ORAL_TABLET | Freq: Every day | ORAL | Status: DC
  Administered 2011-05-29 – 2011-06-01 (×4): 1 mg via ORAL
  Filled 2011-05-28 (×5): qty 1

## 2011-05-28 MED ORDER — HYDROXYZINE HCL 25 MG PO TABS
25.0000 mg | ORAL_TABLET | ORAL | Status: AC
  Administered 2011-05-29: 25 mg via ORAL
  Filled 2011-05-28 (×2): qty 1

## 2011-05-28 MED ORDER — ENSURE IMMUNE HEALTH PO LIQD
237.0000 mL | Freq: Every day | ORAL | Status: DC
  Administered 2011-05-29: 237 mL via ORAL
  Filled 2011-05-28 (×6): qty 237

## 2011-05-28 MED ORDER — FENTANYL 25 MCG/HR TD PT72
75.0000 ug | MEDICATED_PATCH | TRANSDERMAL | Status: DC
  Administered 2011-05-28 – 2011-06-01 (×2): 75 ug via TRANSDERMAL
  Filled 2011-05-28: qty 3
  Filled 2011-05-28 (×4): qty 1

## 2011-05-28 MED ORDER — QUETIAPINE FUMARATE 100 MG PO TABS
100.0000 mg | ORAL_TABLET | ORAL | Status: AC
  Administered 2011-05-28: 100 mg via ORAL
  Filled 2011-05-28: qty 1

## 2011-05-28 MED ORDER — AMLODIPINE BESYLATE 10 MG PO TABS
10.0000 mg | ORAL_TABLET | Freq: Every day | ORAL | Status: DC
  Administered 2011-05-29 – 2011-06-01 (×4): 10 mg via ORAL
  Filled 2011-05-28 (×6): qty 1

## 2011-05-28 MED ORDER — LOPERAMIDE HCL 2 MG PO CAPS
2.0000 mg | ORAL_CAPSULE | ORAL | Status: DC | PRN
  Administered 2011-06-01: 2 mg via ORAL
  Filled 2011-05-28: qty 1

## 2011-05-28 MED ORDER — MECLIZINE HCL 25 MG PO TABS: 25.0000 mg | ORAL_TABLET | Freq: Three times a day (TID) | ORAL | Status: DC | PRN

## 2011-05-28 MED ORDER — CLONIDINE HCL 0.1 MG PO TABS
0.1000 mg | ORAL_TABLET | Freq: Two times a day (BID) | ORAL | Status: DC | PRN
  Filled 2011-05-28 (×2): qty 1

## 2011-05-28 MED ORDER — LATANOPROST 0.005 % OP SOLN
1.0000 [drp] | Freq: Every day | OPHTHALMIC | Status: DC
  Administered 2011-05-29 – 2011-06-01 (×3): 1 [drp] via OPHTHALMIC
  Filled 2011-05-28 (×3): qty 2.5

## 2011-05-28 MED ORDER — POLYETHYLENE GLYCOL 3350 17 G PO PACK
17.0000 g | PACK | Freq: Every day | ORAL | Status: DC | PRN
  Filled 2011-05-28 (×2): qty 1

## 2011-05-28 MED ORDER — SODIUM CHLORIDE 0.9 % IV BOLUS (SEPSIS)
500.0000 mL | Freq: Once | INTRAVENOUS | Status: AC
  Administered 2011-05-28: 500 mL via INTRAVENOUS

## 2011-05-28 MED ORDER — PANTOPRAZOLE SODIUM 40 MG PO TBEC
40.0000 mg | DELAYED_RELEASE_TABLET | Freq: Every day | ORAL | Status: DC
  Administered 2011-05-29 – 2011-05-31 (×2): 40 mg via ORAL
  Filled 2011-05-28 (×5): qty 1

## 2011-05-28 MED ORDER — FUROSEMIDE 20 MG PO TABS
40.0000 mg | ORAL_TABLET | Freq: Every day | ORAL | Status: DC
  Administered 2011-05-29 – 2011-06-01 (×4): 40 mg via ORAL
  Filled 2011-05-28 (×4): qty 2

## 2011-05-28 MED ORDER — ADULT MULTIVITAMIN W/MINERALS CH
1.0000 | ORAL_TABLET | Freq: Every day | ORAL | Status: DC
  Administered 2011-05-29 – 2011-06-01 (×4): 1 via ORAL
  Filled 2011-05-28 (×5): qty 1

## 2011-05-28 MED ORDER — ZOLPIDEM TARTRATE 5 MG PO TABS
5.0000 mg | ORAL_TABLET | Freq: Every day | ORAL | Status: DC
  Administered 2011-05-29 – 2011-05-31 (×2): 5 mg via ORAL
  Filled 2011-05-28 (×4): qty 1

## 2011-05-28 MED ORDER — PANTOPRAZOLE SODIUM 40 MG PO TBEC
40.0000 mg | DELAYED_RELEASE_TABLET | Freq: Every day | ORAL | Status: DC
  Administered 2011-05-28 – 2011-06-01 (×4): 40 mg via ORAL
  Filled 2011-05-28 (×2): qty 1

## 2011-05-28 MED ORDER — PROMETHAZINE HCL 25 MG PO TABS
25.0000 mg | ORAL_TABLET | Freq: Four times a day (QID) | ORAL | Status: DC | PRN
  Filled 2011-05-28 (×2): qty 1

## 2011-05-28 MED ORDER — TRAMADOL HCL 50 MG PO TABS
100.0000 mg | ORAL_TABLET | Freq: Every day | ORAL | Status: DC
  Administered 2011-05-29: 100 mg via ORAL
  Filled 2011-05-28: qty 2
  Filled 2011-05-28: qty 1
  Filled 2011-05-28: qty 2

## 2011-05-28 MED ORDER — TRAMADOL HCL 50 MG PO TABS
100.0000 mg | ORAL_TABLET | Freq: Four times a day (QID) | ORAL | Status: DC | PRN
  Administered 2011-05-31: 50 mg via ORAL
  Administered 2011-05-31 – 2011-06-01 (×3): 100 mg via ORAL
  Filled 2011-05-28 (×3): qty 2

## 2011-05-28 MED ORDER — SODIUM CHLORIDE 0.9 % IV SOLN
INTRAVENOUS | Status: DC
  Administered 2011-05-29 – 2011-05-30 (×2): 125 mL/h via INTRAVENOUS

## 2011-05-28 MED ORDER — METHOCARBAMOL 500 MG PO TABS: 750.0000 mg | ORAL_TABLET | Freq: Four times a day (QID) | ORAL | Status: DC | PRN

## 2011-05-28 MED ORDER — OXYCODONE HCL 5 MG PO TABS
5.0000 mg | ORAL_TABLET | ORAL | Status: DC | PRN
  Administered 2011-05-29 (×2): 5 mg via ORAL
  Administered 2011-05-30: 10 mg via ORAL
  Administered 2011-05-30: 5 mg via ORAL
  Administered 2011-06-01: 10 mg via ORAL
  Filled 2011-05-28: qty 1
  Filled 2011-05-28: qty 2
  Filled 2011-05-28 (×2): qty 1
  Filled 2011-05-28: qty 2

## 2011-05-28 MED ORDER — ROPINIROLE HCL 1 MG PO TABS
1.0000 mg | ORAL_TABLET | Freq: Every day | ORAL | Status: DC
  Administered 2011-05-29 – 2011-06-01 (×4): 1 mg via ORAL
  Filled 2011-05-28 (×5): qty 1

## 2011-05-28 MED ORDER — OXYBUTYNIN CHLORIDE ER 5 MG PO TB24
5.0000 mg | ORAL_TABLET | Freq: Every day | ORAL | Status: DC
  Administered 2011-05-29 – 2011-06-01 (×4): 5 mg via ORAL
  Filled 2011-05-28 (×5): qty 1

## 2011-05-28 MED ORDER — BUPROPION HCL ER (XL) 300 MG PO TB24
450.0000 mg | ORAL_TABLET | Freq: Every morning | ORAL | Status: DC
  Administered 2011-05-29 – 2011-05-31 (×3): 450 mg via ORAL
  Administered 2011-06-01: 300 mg via ORAL
  Filled 2011-05-28 (×5): qty 1

## 2011-05-28 MED ORDER — CARVEDILOL 6.25 MG PO TABS
6.2500 mg | ORAL_TABLET | Freq: Two times a day (BID) | ORAL | Status: DC
  Administered 2011-05-29 – 2011-06-01 (×7): 6.25 mg via ORAL
  Filled 2011-05-28 (×9): qty 1

## 2011-05-28 MED ORDER — QUETIAPINE FUMARATE 100 MG PO TABS
100.0000 mg | ORAL_TABLET | Freq: Two times a day (BID) | ORAL | Status: DC
  Administered 2011-05-29 – 2011-05-30 (×3): 100 mg via ORAL
  Filled 2011-05-28 (×5): qty 1

## 2011-05-28 MED ORDER — HYDROXYZINE PAMOATE 25 MG PO CAPS
25.0000 mg | ORAL_CAPSULE | Freq: Two times a day (BID) | ORAL | Status: DC
  Filled 2011-05-28 (×2): qty 1
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
## 2011-05-28 NOTE — ED Notes (Signed)
[facility] is looking into 3 places to find her a bed.

## 2011-05-28 NOTE — ED Notes (Signed)
Pt arrived via [facility] c/o hallucinations x 3 weeks per staff [employer] living.
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
## 2011-05-29 LAB — BASIC METABOLIC PANEL
CO2: 27 mEq/L (ref 19–32)
Glucose, Bld: 88 mg/dL (ref 70–99)
Potassium: 3.2 mEq/L — ABNORMAL LOW (ref 3.5–5.1)
Sodium: 139 mEq/L (ref 135–145)

## 2011-05-29 MED ORDER — HYDROXYZINE HCL 25 MG PO TABS
25.0000 mg | ORAL_TABLET | Freq: Two times a day (BID) | ORAL | Status: DC
  Administered 2011-05-29 – 2011-06-01 (×6): 25 mg via ORAL
  Filled 2011-05-29 (×7): qty 1

## 2011-05-29 MED ORDER — POTASSIUM CHLORIDE 20 MEQ/15ML (10%) PO LIQD
40.0000 meq | Freq: Once | ORAL | Status: AC
  Administered 2011-05-29: 40 meq via ORAL
  Filled 2011-05-29: qty 30

## 2011-05-29 MED ORDER — SODIUM CHLORIDE 0.9 % IV BOLUS (SEPSIS)
1000.0000 mL | Freq: Once | INTRAVENOUS | Status: AC
  Administered 2011-05-29: 1000 mL via INTRAVENOUS

## 2011-05-29 NOTE — ED Notes (Signed)
Patient arrived from [facility].  Assisted up to the bathroom by tech.  Back to bed, side rails up call bell within reach

## 2011-05-29 NOTE — ED Notes (Signed)
Telepsych called and faxed paperwork.

## 2011-05-29 NOTE — ED Notes (Signed)
Telepsych complete

## 2011-05-30 LAB — URINALYSIS, ROUTINE W REFLEX MICROSCOPIC
Ketones, ur: NEGATIVE mg/dL
Nitrite: NEGATIVE
Protein, ur: NEGATIVE mg/dL
pH: 5.5 (ref 5.0–8.0)

## 2011-05-30 LAB — URINE MICROSCOPIC-ADD ON

## 2011-05-30 MED ORDER — QUETIAPINE FUMARATE 50 MG PO TABS
50.0000 mg | ORAL_TABLET | Freq: Two times a day (BID) | ORAL | Status: DC
  Administered 2011-05-31 – 2011-06-01 (×4): 50 mg via ORAL
  Filled 2011-05-30 (×6): qty 1

## 2011-05-30 NOTE — ED Provider Notes (Addendum)
8 am alert, pleasant coopertive. placement pending.  Doug Sou, MD 05/30/11 [phone]  Telepsych consult read, suggest s continue present plan and attempt to find inpt psych bed  Doug Sou, MD 05/30/11 [phone]

## 2011-05-30 NOTE — ED Notes (Signed)
Pt eating breakfast that this time.

## 2011-05-30 NOTE — ED Notes (Signed)
Sitter arrived to pt bedside. Sitter given report. Sitter sheet provided. Sitter at bedside for safety.

## 2011-05-30 NOTE — ED Notes (Signed)
Pt. Upset due to why she was here. Nurse medicated pt. Seems to have sattle down. Now.

## 2011-05-30 NOTE — ED Notes (Signed)
Telepsych  Called Dr. Brooke Dare and is ready to evaluate the pt.

## 2011-05-30 NOTE — ED Notes (Signed)
Dinner tray ordered, reg nonsharp 

## 2011-05-30 NOTE — ED Provider Notes (Addendum)
Called to the patient's bedside. They're concerned because the patient was unable to ambulate without assistance. She typically requires assistance with a walker. On talking the patient I feel that she may be overly medicated as she is on multiple pain medications and multiple anti-psychotic agents. I will request a telemetry psych consultation to evaluate for possible medication adjustments.  Reduced seroquel per psych recommendations to 50mg  BID  Dayton Bailiff, MD 05/30/11 1610  Dayton Bailiff, MD 05/30/11 2328

## 2011-05-30 NOTE — ED Notes (Signed)
Per charge nurse, pt is to have shower after telepsych

## 2011-05-30 NOTE — ED Notes (Signed)
Telepsych called and paperwork faxed. 
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
## 2011-05-30 NOTE — ED Notes (Signed)
Dinner tray delivered.

## 2011-05-30 NOTE — ED Notes (Signed)
Pt lying in bed with eyes open. Alert. Oriented to self. Re-oriented to place and time. Cooperative, following commands  And answering questions appropriately. Denies any auditory/visual hallucinations, any suicidal or homocidial thoughts at present.

## 2011-05-30 NOTE — ED Notes (Signed)
Helped pt ambulate to bathroom. Unsteady gate but continues to get out of bed by herself after continuing to tell her to call for assistance

## 2011-05-30 NOTE — ED Notes (Signed)
Dr. Brooke Dare ordered to hold all medications (except eye drops) until telepsych. MD believed pt is overmedicated and that is cause of change in status.

## 2011-05-30 NOTE — ED Notes (Signed)
Pt ambulated to restroom with Toniann Fail, Charity fundraiser. Pt became very sob and is weak. MD notified of change in pt status.

## 2011-05-31 ENCOUNTER — Emergency Department (HOSPITAL_COMMUNITY): Payer: PRIVATE HEALTH INSURANCE

## 2011-05-31 NOTE — ED Notes (Signed)
Spoke with Dr. Read Drivers regarding the hold that was placed on pt's scheduled medications.  Was instructed to continue to hold until telepsych report was released.

## 2011-05-31 NOTE — ED Notes (Signed)
Patient is sitting up in bed eating lunch.

## 2011-05-31 NOTE — ED Notes (Signed)
PT has been accepted to [facility] . Order a chest x-ray  And fax the results of  Chest  X-ray  To [facility]  @ [phone] . Pt will go to [facility]  In AM.

## 2011-05-31 NOTE — ED Notes (Signed)
Pt with left AC PIV - IV pump alarming. Site assessed and pink and catheter bent. IV leaks when flushed. IV discontinued with cath tip intact.
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
## 2011-05-31 NOTE — ED Notes (Signed)
Pt evaluated by telepsych provider

## 2011-05-31 NOTE — ED Notes (Signed)
Granddaughter Marcelle Smiling Posner [phone] called and wanted status update.  Granddaughter wants to be notified if pt is discharged

## 2011-05-31 NOTE — ED Notes (Signed)
Pt states that pain is better.

## 2011-05-31 NOTE — ED Notes (Signed)
Pt requesting pain meds for left side.  Pt was told that ultram is ordered every 6 hrs PRN but that pt was given dose at 09:51.  Pt can have another dose at 16:00

## 2011-05-31 NOTE — ED Notes (Signed)
I observed pt getting out of her bed and walking down the hall.  I instructed pt to have a seat on her bed while I get a wheelchair to help her go to the bathroom.  When I returned pt was laying down with her covers over her head.  I placed a wheelchair at patient's door and instructed her to use her call bell when she needed assistance with getting up to go to the bathroom.

## 2011-05-31 NOTE — ED Notes (Signed)
Pt taking POs without problems Dr. Read Drivers updated re: IV status. No order to re-start IV for now.

## 2011-05-31 NOTE — ED Notes (Signed)
Pt is oriented X4, appropriate behavior.  Pt aware that she was "talking crazy" yesterday but feels much better now.  Pt states she is depressed sometimes and has a lot of pain.  Pt states she has occasionally thought of hurting self but has no plan

## 2011-05-31 NOTE — ED Notes (Signed)
Seroquel dose sent to pharmacy.

## 2011-05-31 NOTE — ED Notes (Addendum)
Pt moved to rm 25 in order to be within visual observation of Rn and Tech.  Pt has been attempting to get out of the bed without assistance.  Pt very unsteady on her feet and has been asked by RN and Tech to remain in bed or ask for assistance.  Per telepsych consult pt has SI.  RN requesting sitter be assigned to pt for SI as well as pt safety.

## 2011-05-31 NOTE — ED Notes (Signed)
Report to oncoming Rn.

## 2011-06-01 MED ORDER — POTASSIUM CHLORIDE CRYS ER 20 MEQ PO TBCR
40.0000 meq | EXTENDED_RELEASE_TABLET | Freq: Once | ORAL | Status: AC
  Administered 2011-06-01: 40 meq via ORAL
  Filled 2011-06-01: qty 2

## 2011-06-01 NOTE — ED Notes (Signed)
Dinner tray ordered, reg nonsharp 

## 2011-06-01 NOTE — B[facility] Counselor (Signed)
[name]' information was faxed to [facility].  Jennifer [name] there said that their psychiatrist would review information after 0600.  Jennifer [name] with [facility] said that if [facility] [name] is accepted to [facility] they will take her there.  If accepted to [facility], nursing staff can call TA at [phone].

## 2011-06-01 NOTE — ED Notes (Signed)
Per [facility]; [facility] continues to review for placement, [facility] declined.  Pt has 1415 appointment at [facility] and if pt is discharged, pt's assisted living facility will transport pt to that appointment, Misty Stanley will call back.

## 2011-06-01 NOTE — ED Provider Notes (Signed)
Pt stable She is at baseline per ACT and mobile crisis assessment who know patient Family comfortable with d/c back to assisted living Also, assisted living facility will take patient back BP 104/74  Pulse 89  Temp(Src) 97.7 F (36.5 C) (Oral)  Resp 20  SpO2 97%   Joya Gaskins, MD 06/01/11 [phone]

## 2011-06-01 NOTE — ED Notes (Signed)
[facility] notified for pt transport to [facility] care facility.

## 2011-06-01 NOTE — ED Notes (Signed)
Pt has breakfast meal served, is requesting pain medication for L sided hip/pelvic pain.  Pt is pleasant and cooperative.  Sitter at bedside.

## 2011-06-01 NOTE — ED Notes (Signed)
Dinner tray delivered.

## 2011-06-01 NOTE — ED Notes (Signed)
Accepted to [facility] pending a chest x-ray reviewed as below:  CXR 05/31/11  STABLE EXAM. CM WITH CEPHALIZATION OF PULMONARY VASCULAR.  CHRONIC PERIBRONCHIAL THICKENING. Per RADIOLOGIST.   Results for orders placed during the hospital encounter of 05/28/11  CBC      Component Value Range   WBC 7.1  4.0 - 10.5 (K/uL)   RBC 3.71 (*) 3.87 - 5.11 (MIL/uL)   Hemoglobin 11.2 (*) 12.0 - 15.0 (g/dL)   HCT 16.1 (*) 09.6 - 46.0 (%)   MCV 90.8  78.0 - 100.0 (fL)   MCH 30.2  26.0 - 34.0 (pg)   MCHC 33.2  30.0 - 36.0 (g/dL)   RDW 04.5  40.9 - 81.1 (%)   Platelets 289  150 - 400 (K/uL)  DIFFERENTIAL      Component Value Range   Neutrophils Relative 63  43 - 77 (%)   Neutro Abs 4.5  1.7 - 7.7 (K/uL)   Lymphocytes Relative 28  12 - 46 (%)   Lymphs Abs 2.0  0.7 - 4.0 (K/uL)   Monocytes Relative 6  3 - 12 (%)   Monocytes Absolute 0.4  0.1 - 1.0 (K/uL)   Eosinophils Relative 2  0 - 5 (%)   Eosinophils Absolute 0.1  0.0 - 0.7 (K/uL)   Basophils Relative 1  0 - 1 (%)   Basophils Absolute 0.1  0.0 - 0.1 (K/uL)  COMPREHENSIVE METABOLIC PANEL      Component Value Range   Sodium 137  135 - 145 (mEq/L)   Potassium 3.3 (*) 3.5 - 5.1 (mEq/L)   Chloride 99  96 - 112 (mEq/L)   CO2 28  19 - 32 (mEq/L)   Glucose, Bld 101 (*) 70 - 99 (mg/dL)   BUN 27 (*) 6 - 23 (mg/dL)   Creatinine, Ser 9.14 (*) 0.50 - 1.10 (mg/dL)   Calcium 9.3  8.4 - 78.2 (mg/dL)   Total Protein 7.4  6.0 - 8.3 (g/dL)   Albumin 3.5  3.5 - 5.2 (g/dL)   AST 23  0 - 37 (U/L)   ALT 13  0 - 35 (U/L)   Alkaline Phosphatase 105  39 - 117 (U/L)   Total Bilirubin 0.2 (*) 0.3 - 1.2 (mg/dL)   GFR calc non Af Amer 20 (*) >90 (mL/min)   GFR calc Af Amer 23 (*) >90 (mL/min)  URINALYSIS, ROUTINE W REFLEX MICROSCOPIC      Component Value Range   Color, Urine YELLOW  YELLOW    APPearance CLEAR  CLEAR    Specific Gravity, Urine 1.013  1.005 - 1.030    pH 5.5  5.0 - 8.0    Glucose, UA NEGATIVE  NEGATIVE (mg/dL)   Hgb urine dipstick NEGATIVE   NEGATIVE    Bilirubin Urine NEGATIVE  NEGATIVE    Ketones, ur NEGATIVE  NEGATIVE (mg/dL)   Protein, ur NEGATIVE  NEGATIVE (mg/dL)   Urobilinogen, UA 0.2  0.0 - 1.0 (mg/dL)   Nitrite NEGATIVE  NEGATIVE    Leukocytes, UA SMALL (*) NEGATIVE   URINE RAPID DRUG SCREEN (HOSP PERFORMED)      Component Value Range   Opiates NONE DETECTED  NONE DETECTED    Cocaine NONE DETECTED  NONE DETECTED    Benzodiazepines NONE DETECTED  NONE DETECTED    Amphetamines NONE DETECTED  NONE DETECTED    Tetrahydrocannabinol NONE DETECTED  NONE DETECTED    Barbiturates NONE DETECTED  NONE DETECTED   BASIC METABOLIC PANEL      Component  Value Range   Sodium 139  135 - 145 (mEq/L)   Potassium 3.2 (*) 3.5 - 5.1 (mEq/L)   Chloride 103  96 - 112 (mEq/L)   CO2 27  19 - 32 (mEq/L)   Glucose, Bld 88  70 - 99 (mg/dL)   BUN 24 (*) 6 - 23 (mg/dL)   Creatinine, Ser 1.61 (*) 0.50 - 1.10 (mg/dL)   Calcium 9.1  8.4 - 09.6 (mg/dL)   GFR calc non Af Amer 25 (*) >90 (mL/min)   GFR calc Af Amer 29 (*) >90 (mL/min)  URINE MICROSCOPIC-ADD ON      Component Value Range   Squamous Epithelial / LPF FEW (*) RARE    WBC, UA 3-6  <3 (WBC/hpf)   RBC / HPF 0-2  <3 (RBC/hpf)   Bacteria, UA RARE  RARE    Dg Chest 2 View  05/26/2011  *RADIOLOGY REPORT*  Clinical Data: Chest discomfort and weakness.  CHEST - 2 VIEW  Comparison: Chest x-ray 04/18/2011.  Findings: Lung volumes are normal.  No consolidative airspace disease.  No definite pleural effusions. There is cephalization of the pulmonary vasculature and very mild indistinctness of the interstitial markings.  Mild cardiomegaly is unchanged. The patient is rotated to the right on today's exam, resulting in distortion of the mediastinal contours and reduced diagnostic sensitivity and specificity for mediastinal pathology.  Atherosclerotic calcifications within the arch of the aorta.  IMPRESSION: 1. Cardiomegaly with cephalization of the pulmonary vasculature and mild indistinctness of the  interstitial markings.  Findings may suggest early congestive heart failure. 2.  Atherosclerosis.  Original Report Authenticated By: Florencia Reasons, M.D.                 Sunnie Nielsen, MD 06/01/11 0201

## 2011-06-01 NOTE — Discharge Instructions (Signed)
 RESOURCE GUIDE  Dental Problems  Patients with Medicaid: [facility]                     [facility] [address]                                           [address] Phone:  [phone]                                                  Phone:  [phone]  If unable to pay or uninsured, contact:  [facility] or [facility] to become qualified for the adult dental clinic.  Chronic Pain Problems Contact [facility]  [phone] Patients need to be referred by their primary care doctor.  Insufficient Money for Medicine Contact [employer]:  call "211" or [facility] 271-5999.  No Primary Care Doctor Call [facility]  832-8000 Other agencies that provide inexpensive medical care    [facility]  [phone]    [facility]  [phone]    [facility]  [phone]    [facility] Clinic  [phone]    [facility]  [phone]    [facility]  [phone]  Psychological Services [facility]  [phone] [facility]  [phone] [facility]   800 853-5163 (emergency services 641-4993)  Substance Abuse Resources [facility]  336-882-2125 [facility] 336-784-9470 [facility] 336-285-9073 [facility] 336-845-3988 Residential & Outpatient Substance Abuse Program  800-659-3381  Abuse/Neglect [place] Child Abuse Hotline (336) 641-3795 [place] Child Abuse Hotline 800-378-5315 (After Hours)  Emergency Shelter  [facility] (336) 271-5985  [facility] (336) 275-9566 [facility] (704) 372-4663  MRSA Hotline #:   832-7006    [place] [facility]  [facility] [place]     [employer]                          [facility] [address] [place]                       [address]      [address]  [place]                                                [place]                            [place] Phone:  [phone]                                   Phone:  [phone]                 Phone:  [phone]  [facility] Phone:    [phone]  [place] Child Abuse Hotline (336) 342-1394 (336) 342-3537 (After Hours)   

## 2011-06-01 NOTE — ED Notes (Signed)
Pt given her belongings and is getting dressed. Report called to [facility] senior [facility]. Waiting on [facility] to arrive.

## 2011-06-01 NOTE — ED Notes (Addendum)
Misty Stanley with mobile crisis called this RN regarding this pt. States that [facility] facility has not returned her call regarding the pts placement at this time. Her phone number is [phone]

## 2011-06-01 NOTE — ED Notes (Signed)
Misty Stanley with mobile crisis in with pt.

## 2011-06-02 NOTE — ED Provider Notes (Signed)
Medical screening examination/treatment/procedure(s) were conducted as a shared visit with non-physician practitioner(s) and myself.  I personally evaluated the patient during the encounter  Flint Melter, MD 06/02/11 [id number]

## 2011-07-16 ENCOUNTER — Encounter (HOSPITAL_COMMUNITY): Payer: Self-pay

## 2011-07-16 ENCOUNTER — Emergency Department (HOSPITAL_COMMUNITY): Payer: PRIVATE HEALTH INSURANCE

## 2011-07-16 ENCOUNTER — Emergency Department (HOSPITAL_COMMUNITY)
Admission: EM | Admit: 2011-07-16 | Discharge: 2011-07-17 | Disposition: A | Discharge status: Home / Self Care | Payer: PRIVATE HEALTH INSURANCE | Attending: Emergency Medicine | Admitting: Emergency Medicine

## 2011-07-16 DIAGNOSIS — Z79899 Other long term (current) drug therapy: Secondary | ICD-10-CM | POA: Insufficient documentation

## 2011-07-16 DIAGNOSIS — W07XXXA Fall from chair, initial encounter: Secondary | ICD-10-CM | POA: Insufficient documentation

## 2011-07-16 DIAGNOSIS — F0390 Unspecified dementia without behavioral disturbance: Secondary | ICD-10-CM | POA: Insufficient documentation

## 2011-07-16 DIAGNOSIS — R51 Headache: Secondary | ICD-10-CM | POA: Insufficient documentation

## 2011-07-16 DIAGNOSIS — Z8739 Personal history of other diseases of the musculoskeletal system and connective tissue: Secondary | ICD-10-CM | POA: Insufficient documentation

## 2011-07-16 DIAGNOSIS — M25569 Pain in unspecified knee: Secondary | ICD-10-CM | POA: Insufficient documentation

## 2011-07-16 DIAGNOSIS — M25562 Pain in left knee: Secondary | ICD-10-CM

## 2011-07-16 DIAGNOSIS — W19XXXA Unspecified fall, initial encounter: Secondary | ICD-10-CM

## 2011-07-16 DIAGNOSIS — I10 Essential (primary) hypertension: Secondary | ICD-10-CM | POA: Insufficient documentation

## 2011-07-16 MED ORDER — ACETAMINOPHEN 325 MG PO TABS
325.0000 mg | ORAL_TABLET | Freq: Once | ORAL | Status: AC
  Administered 2011-07-16: 325 mg via ORAL
  Filled 2011-07-16: qty 2

## 2011-07-16 NOTE — Discharge Instructions (Signed)
Jennifer [name] does not appear to have any serious injury on exam today. We performed an xray on her left knee which was normal. There is currently no evidence worrisome for head injury. Please have her return if she has increased confusion, vomiting, or any other worrisome symptoms.  Knee Pain The knee is the complex joint between your thigh and your lower leg. It is made up of bones, tendons, ligaments, and cartilage. The bones that make up the knee are:  The femur in the thigh.   The tibia and fibula in the lower leg.   The patella or kneecap riding in the groove on the lower femur.  CAUSES  Knee pain is a common complaint with many causes. A few of these causes are:  Injury, such as:   A ruptured ligament or tendon injury.   Torn cartilage.   Medical conditions, such as:   Gout   Arthritis   Infections   Overuse, over training or overdoing a physical activity.  Knee pain can be minor or severe. Knee pain can accompany debilitating injury. Minor knee problems often respond well to self-care measures or get well on their own. More serious injuries may need medical intervention or even surgery. SYMPTOMS The knee is complex. Symptoms of knee problems can vary widely. Some of the problems are:  Pain with movement and weight bearing.   Swelling and tenderness.   Buckling of the knee.   Inability to straighten or extend your knee.   Your knee locks and you cannot straighten it.   Warmth and redness with pain and fever.   Deformity or dislocation of the kneecap.  DIAGNOSIS  Determining what is wrong may be very straight forward such as when there is an injury. It can also be challenging because of the complexity of the knee. Tests to make a diagnosis may include:  Your caregiver taking a history and doing a physical exam.   Routine X-rays can be used to rule out other problems. X-rays will not reveal a cartilage tear. Some injuries of the knee can be diagnosed by:    Arthroscopy a surgical technique by which a small video camera is inserted through tiny incisions on the sides of the knee. This procedure is used to examine and repair internal knee joint problems. Tiny instruments can be used during arthroscopy to repair the torn knee cartilage (meniscus).   Arthrography is a radiology technique. A contrast liquid is directly injected into the knee joint. Internal structures of the knee joint then become visible on X-ray film.   An MRI scan is a non x-ray radiology procedure in which magnetic fields and a computer produce two- or three-dimensional images of the inside of the knee. Cartilage tears are often visible using an MRI scanner. MRI scans have largely replaced arthrography in diagnosing cartilage tears of the knee.   Blood work.   Examination of the fluid that helps to lubricate the knee joint (synovial fluid). This is done by taking a sample out using a needle and a syringe.  TREATMENT The treatment of knee problems depends on the cause. Some of these treatments are:  Depending on the injury, proper casting, splinting, surgery or physical therapy care will be needed.   Give yourself adequate recovery time. Do not overuse your joints. If you begin to get sore during workout routines, back off. Slow down or do fewer repetitions.   For repetitive activities such as cycling or running, maintain your strength and nutrition.   Alternate  muscle groups. For example if you are a weight lifter, work the upper body on one day and the lower body the next.   Either tight or weak muscles do not give the proper support for your knee. Tight or weak muscles do not absorb the stress placed on the knee joint. Keep the muscles surrounding the knee strong.   Take care of mechanical problems.   If you have flat feet, orthotics or special shoes may help. See your caregiver if you need help.   Arch supports, sometimes with wedges on the inner or outer aspect of the  heel, can help. These can shift pressure away from the side of the knee most bothered by osteoarthritis.   A brace called an "unloader" brace also may be used to help ease the pressure on the most arthritic side of the knee.   If your caregiver has prescribed crutches, braces, wraps or ice, use as directed. The acronym for this is PRICE. This means protection, rest, ice, compression and elevation.   Nonsteroidal anti-inflammatory drugs (NSAID's), can help relieve pain. But if taken immediately after an injury, they may actually increase swelling. Take NSAID's with food in your stomach. Stop them if you develop stomach problems. Do not take these if you have a history of ulcers, stomach pain or bleeding from the bowel. Do not take without your caregiver's approval if you have problems with fluid retention, heart failure, or kidney problems.   For ongoing knee problems, physical therapy may be helpful.   Glucosamine and chondroitin are over-the-counter dietary supplements. Both may help relieve the pain of osteoarthritis in the knee. These medicines are different from the usual anti-inflammatory drugs. Glucosamine may decrease the rate of cartilage destruction.   Injections of a corticosteroid drug into your knee joint may help reduce the symptoms of an arthritis flare-up. They may provide pain relief that lasts a few months. You may have to wait a few months between injections. The injections do have a small increased risk of infection, water retention and elevated blood sugar levels.   Hyaluronic acid injected into damaged joints may ease pain and provide lubrication. These injections may work by reducing inflammation. A series of shots may give relief for as long as 6 months.   Topical painkillers. Applying certain ointments to your skin may help relieve the pain and stiffness of osteoarthritis. Ask your pharmacist for suggestions. Many over the-counter products are approved for temporary relief of  arthritis pain.   In some countries, doctors often prescribe topical NSAID's for relief of chronic conditions such as arthritis and tendinitis. A review of treatment with NSAID creams found that they worked as well as oral medications but without the serious side effects.  PREVENTION  Maintain a healthy weight. Extra pounds put more strain on your joints.   Get strong, stay limber. Weak muscles are a common cause of knee injuries. Stretching is important. Include flexibility exercises in your workouts.   Be smart about exercise. If you have osteoarthritis, chronic knee pain or recurring injuries, you may need to change the way you exercise. This does not mean you have to stop being active. If your knees ache after jogging or playing basketball, consider switching to swimming, water aerobics or other low-impact activities, at least for a few days a week. Sometimes limiting high-impact activities will provide relief.   Make sure your shoes fit well. Choose footwear that is right for your sport.   Protect your knees. Use the proper gear for  knee-sensitive activities. Use kneepads when playing volleyball or laying carpet. Buckle your seat belt every time you drive. Most shattered kneecaps occur in car accidents.   Rest when you are tired.  SEEK MEDICAL CARE IF:  You have knee pain that is continual and does not seem to be getting better.  SEEK IMMEDIATE MEDICAL CARE IF:  Your knee joint feels hot to the touch and you have a high fever. MAKE SURE YOU:   Understand these instructions.   Will watch your condition.   Will get help right away if you are not doing well or get worse.  Document Released: 11/28/2006 Document Revised: 01/20/2011 Document Reviewed: 11/28/2006 [facility] Patient Information 2012 [place], [place].Knee Pain The knee is the complex joint between your thigh and your lower leg. It is made up of bones, tendons, ligaments, and cartilage. The bones that make up the knee  are:  The femur in the thigh.   The tibia and fibula in the lower leg.   The patella or kneecap riding in the groove on the lower femur.  CAUSES  Knee pain is a common complaint with many causes. A few of these causes are:  Injury, such as:   A ruptured ligament or tendon injury.   Torn cartilage.   Medical conditions, such as:   Gout   Arthritis   Infections   Overuse, over training or overdoing a physical activity.  Knee pain can be minor or severe. Knee pain can accompany debilitating injury. Minor knee problems often respond well to self-care measures or get well on their own. More serious injuries may need medical intervention or even surgery. SYMPTOMS The knee is complex. Symptoms of knee problems can vary widely. Some of the problems are:  Pain with movement and weight bearing.   Swelling and tenderness.   Buckling of the knee.   Inability to straighten or extend your knee.   Your knee locks and you cannot straighten it.   Warmth and redness with pain and fever.   Deformity or dislocation of the kneecap.  DIAGNOSIS  Determining what is wrong may be very straight forward such as when there is an injury. It can also be challenging because of the complexity of the knee. Tests to make a diagnosis may include:  Your caregiver taking a history and doing a physical exam.   Routine X-rays can be used to rule out other problems. X-rays will not reveal a cartilage tear. Some injuries of the knee can be diagnosed by:   Arthroscopy a surgical technique by which a small video camera is inserted through tiny incisions on the sides of the knee. This procedure is used to examine and repair internal knee joint problems. Tiny instruments can be used during arthroscopy to repair the torn knee cartilage (meniscus).   Arthrography is a radiology technique. A contrast liquid is directly injected into the knee joint. Internal structures of the knee joint then become visible on  X-ray film.   An MRI scan is a non x-ray radiology procedure in which magnetic fields and a computer produce two- or three-dimensional images of the inside of the knee. Cartilage tears are often visible using an MRI scanner. MRI scans have largely replaced arthrography in diagnosing cartilage tears of the knee.   Blood work.   Examination of the fluid that helps to lubricate the knee joint (synovial fluid). This is done by taking a sample out using a needle and a syringe.  TREATMENT The treatment of knee problems depends  on the cause. Some of these treatments are:  Depending on the injury, proper casting, splinting, surgery or physical therapy care will be needed.   Give yourself adequate recovery time. Do not overuse your joints. If you begin to get sore during workout routines, back off. Slow down or do fewer repetitions.   For repetitive activities such as cycling or running, maintain your strength and nutrition.   Alternate muscle groups. For example if you are a weight lifter, work the upper body on one day and the lower body the next.   Either tight or weak muscles do not give the proper support for your knee. Tight or weak muscles do not absorb the stress placed on the knee joint. Keep the muscles surrounding the knee strong.   Take care of mechanical problems.   If you have flat feet, orthotics or special shoes may help. See your caregiver if you need help.   Arch supports, sometimes with wedges on the inner or outer aspect of the heel, can help. These can shift pressure away from the side of the knee most bothered by osteoarthritis.   A brace called an "unloader" brace also may be used to help ease the pressure on the most arthritic side of the knee.   If your caregiver has prescribed crutches, braces, wraps or ice, use as directed. The acronym for this is PRICE. This means protection, rest, ice, compression and elevation.   Nonsteroidal anti-inflammatory drugs (NSAID's), can  help relieve pain. But if taken immediately after an injury, they may actually increase swelling. Take NSAID's with food in your stomach. Stop them if you develop stomach problems. Do not take these if you have a history of ulcers, stomach pain or bleeding from the bowel. Do not take without your caregiver's approval if you have problems with fluid retention, heart failure, or kidney problems.   For ongoing knee problems, physical therapy may be helpful.   Glucosamine and chondroitin are over-the-counter dietary supplements. Both may help relieve the pain of osteoarthritis in the knee. These medicines are different from the usual anti-inflammatory drugs. Glucosamine may decrease the rate of cartilage destruction.   Injections of a corticosteroid drug into your knee joint may help reduce the symptoms of an arthritis flare-up. They may provide pain relief that lasts a few months. You may have to wait a few months between injections. The injections do have a small increased risk of infection, water retention and elevated blood sugar levels.   Hyaluronic acid injected into damaged joints may ease pain and provide lubrication. These injections may work by reducing inflammation. A series of shots may give relief for as long as 6 months.   Topical painkillers. Applying certain ointments to your skin may help relieve the pain and stiffness of osteoarthritis. Ask your pharmacist for suggestions. Many over the-counter products are approved for temporary relief of arthritis pain.   In some countries, doctors often prescribe topical NSAID's for relief of chronic conditions such as arthritis and tendinitis. A review of treatment with NSAID creams found that they worked as well as oral medications but without the serious side effects.  PREVENTION  Maintain a healthy weight. Extra pounds put more strain on your joints.   Get strong, stay limber. Weak muscles are a common cause of knee injuries. Stretching is  important. Include flexibility exercises in your workouts.   Be smart about exercise. If you have osteoarthritis, chronic knee pain or recurring injuries, you may need to change the way you  exercise. This does not mean you have to stop being active. If your knees ache after jogging or playing basketball, consider switching to swimming, water aerobics or other low-impact activities, at least for a few days a week. Sometimes limiting high-impact activities will provide relief.   Make sure your shoes fit well. Choose footwear that is right for your sport.   Protect your knees. Use the proper gear for knee-sensitive activities. Use kneepads when playing volleyball or laying carpet. Buckle your seat belt every time you drive. Most shattered kneecaps occur in car accidents.   Rest when you are tired.  SEEK MEDICAL CARE IF:  You have knee pain that is continual and does not seem to be getting better.  SEEK IMMEDIATE MEDICAL CARE IF:  Your knee joint feels hot to the touch and you have a high fever. MAKE SURE YOU:   Understand these instructions.   Will watch your condition.   Will get help right away if you are not doing well or get worse.  Document Released: 11/28/2006 Document Revised: 01/20/2011 Document Reviewed: 11/28/2006 [facility] Patient Information 2012 [place], [place].

## 2011-07-16 NOTE — ED Provider Notes (Signed)
History     CSN: [id number]  Arrival date & time 07/16/11  2008   First MD Initiated Contact with Patient 07/16/11 2151      Chief Complaint  Patient presents with  . Fall    No LOC  . Headache    occipital  . Knee Pain    left    (Consider location/radiation/quality/duration/timing/severity/associated sxs/prior treatment) HPI Hx from EMS. 76yo F with hx dementia presents s/p fall. Level 5 caveat 2/2 dementia. Pt apparently lost balance while in a chair and struck the back of her head. She did not have any LOC. Not anticoagulated. Has not had any n/v since the event. She has not mentioned any neck or back pain. She is currently c/o slight pain to back of her head and L knee (s/p TKA). Per facility staff, pt at baseline for her behavior.  Past Medical History  Diagnosis Date  . Dementia   . Arthritis   . Blood transfusion   . Hypertension   . Renal disorder   . Seizures   . Psychosis     Past Surgical History  Procedure Date  . Joint replacement   . Breast lumpectomy   . Cholecystectomy     No family history on file.  History  Substance Use Topics  . Smoking status: Never Smoker   . Smokeless tobacco: Not on file  . Alcohol Use: No    OB History    Grav Para Term Preterm Abortions TAB SAB Ect Mult Living                  Review of Systems  Unable to perform ROS: Dementia    Allergies  Codeine  Home Medications   Current Outpatient Rx  Name Route Sig Dispense Refill  . ALENDRONATE SODIUM 70 MG PO TABS Oral Take 70 mg by mouth every 7 (seven) days. Take with a full glass of water on an empty stomach on Fridays.    . AMLODIPINE BESYLATE 10 MG PO TABS Oral Take 10 mg by mouth daily.    . BUPROPION HCL ER (XL) 300 MG PO TB24 Oral Take 300 mg by mouth daily.    [name] CARVEDILOL 6.25 MG PO TABS Oral Take 6.25 mg by mouth 2 (two) times daily with a meal.    . CHOLESTYRAMINE 4 GM/DOSE PO POWD Oral Take 4 g by mouth daily as needed. As needed to control bowel  movement. Not to be given with Lasix. Give 4 hours after Lasix.    [name] CLONIDINE HCL 0.1 MG PO TABS Oral Take 0.1 mg by mouth 2 (two) times daily as needed. As needed for systolic blood pressure greater than 180.    [name] ENSURE IMMUNE HEALTH PO LIQD Oral Take 237 mLs by mouth daily. Strawberry.    . FENTANYL 75 MCG/HR TD PT72 Transdermal Place 1 patch onto the skin every 3 (three) days.    . FUROSEMIDE 40 MG PO TABS Oral Take 40 mg by mouth daily.    [name] HYDROXYZINE PAMOATE 50 MG PO CAPS Oral Take 50 mg by mouth daily.    [name] LATANOPROST 0.005 % OP SOLN Both Eyes Place 1 drop into both eyes at bedtime.    [name] LOPERAMIDE HCL 2 MG PO TABS Oral Take 2 mg by mouth See admin instructions. 1 tablet every 2 hours AS NEEDED for diarrhea (max 16mg Luvenia Heller)    . LORAZEPAM 1 MG PO TABS Oral Take 0.5-1 mg by mouth daily. Take 1mg  every morning  at 6am and1mg  at 2pm and 0.5mg  at bedtime    . MECLIZINE HCL 25 MG PO TABS Oral Take 25 mg by mouth 3 (three) times daily as needed. For dizziness    . METHOCARBAMOL 750 MG PO TABS Oral Take 750 mg by mouth 4 (four) times daily as needed. For pain.    . ADULT MULTIVITAMIN W/MINERALS CH Oral Take 1 tablet by mouth daily.    . OXYBUTYNIN CHLORIDE ER 5 MG PO TB24 Oral Take 5 mg by mouth daily.    . OXYCODONE HCL 5 MG PO CAPS Oral Take 5-10 mg by mouth See admin instructions. Take 5mg  every 4 hours as needed for mild to moderate pain. Take 10mg  every 4 hours as needed for sever pain.    [name] PANTOPRAZOLE SODIUM 40 MG PO TBEC Oral Take 40 mg by mouth daily.    [name] POLYETHYLENE GLYCOL 3350 PO PACK Oral Take 17 g by mouth daily as needed. For constipation.    [name] PROMETHAZINE HCL 25 MG PO TABS Oral Take 25 mg by mouth every 6 (six) hours as needed. For nausea.    [name] RISPERIDONE 0.5 MG PO TABS Oral Take 0.5 mg by mouth daily.    [name] ROPINIROLE HCL 1 MG PO TABS Oral Take 1 mg by mouth See admin instructions. Everyday 1-3 hours before bedtime for restless leg syndrome    . TRAMADOL HCL 50 MG PO TABS  Oral Take 100 mg by mouth every 4 (four) hours as needed. Pain.    [name] TRAMADOL HCL 50 MG PO TABS Oral Take 100 mg by mouth at bedtime.    [name] ZOLPIDEM TARTRATE 5 MG PO TABS Oral Take 5 mg by mouth at bedtime.       BP 131/63  Pulse 91  Temp(Src) 97.3 F (36.3 C) (Oral)  Resp 16  Wt 141 lb (63.957 kg)  SpO2 99%  Physical Exam  Nursing note and vitals reviewed. Constitutional: She appears well-developed and well-nourished. No distress.  HENT:  Head: Normocephalic and atraumatic.  Mouth/Throat: Oropharynx is clear and moist. No oropharyngeal exudate.       No evidence of hematoma or injury to the occiput where pt states she's tender Neg hemotympanum/raccoon's/battle's  Eyes: EOM are normal. Pupils are equal, round, and reactive to light.  Neck: Normal range of motion. Neck supple.  Cardiovascular: Normal rate, regular rhythm and normal heart sounds.   Pulmonary/Chest: Effort normal and breath sounds normal. She exhibits no tenderness.  Abdominal: Soft. Bowel sounds are normal. There is no tenderness. There is no rebound and no guarding.  Musculoskeletal: Normal range of motion.       L knee: surgical scar c/w TKA, no edema, effusion, crepitus, deformity, FROM  Neurological: She is alert. She has normal strength. No cranial nerve deficit or sensory deficit. She exhibits normal muscle tone. Coordination normal. GCS eye subscore is 4. GCS verbal subscore is 5. GCS motor subscore is 6.       Oriented to self only Coordination as assessed with heel/shin, finger/nose intact Pt cooperative with exam  Skin: Skin is warm and dry. She is not diaphoretic.  Psychiatric: She has a normal mood and affect.    ED Course  Procedures (including critical care time)  Labs Reviewed - No data to display Dg Knee Complete 4 Views Left  07/16/2011  *RADIOLOGY REPORT*  Clinical Data: Left knee pain after fall.  LEFT KNEE - COMPLETE 4+ VIEW  Comparison: None.  Findings: Left total knee arthroplasty  with  patellofemoral component.  Components appear well seated although the lateral view is mildly obliqued, limiting visualization from this projection. No focal lucencies at the bone hardware interface.  No evidence of acute fracture or subluxation.  No focal bone lesion or bone destruction.  No significant effusion.  No radiopaque soft tissue foreign bodies.  IMPRESSION: Left total knee arthroplasty.  Components appear well seated.  No acute bony abnormalities.  Original Report Authenticated By: Marlon Pel, M.D.     No diagnosis found. 1) fall   MDM  Pt presents s/p fall at her facility. Awake, alert, GCS 15. Cooperative with exam. No gross neuro deficits on exam. No evidence of trauma to occiput. Not on anticoagulants. Has had multiple head CTs in past several months. Based on this, do not feel that CT head necessary today.   XR of knee obtained as pt s/p TKA. Hardware appears well seated.   Feel pt is stable to be discharged back to her facility at this time. Return precautions given on dc paperwork.  Case d/w Dr. Rubin Payor.      Grant Fontana, [place] 07/16/11 2311

## 2011-07-16 NOTE — ED Notes (Signed)
Per [facility]- pt resides at [facility] [place].  Pt lost balance from chair.  Hit back of head  Contusion- No LOC NO neck or back pain- Per staff pt is at baseline for her behavior.  Paper work with pt in file

## 2011-07-17 NOTE — ED Provider Notes (Signed)
Medical screening examination/treatment/procedure(s) were conducted as a shared visit with non-physician practitioner(s) and myself.  I personally evaluated the patient during the encounter. Fall. Negative x-rays s. She's awake appropriate be discharged home. No apparent need for head CT at this time  Juliet Rude. Rubin Payor, MD 07/17/11 1610

## 2012-11-27 ENCOUNTER — Inpatient Hospital Stay (HOSPITAL_COMMUNITY)
Admission: EM | Admit: 2012-11-27 | Discharge: 2012-11-30 | DRG: 392 | Disposition: A | Discharge status: Other Acute Inpatient Hospital | Payer: Medicare Other | Attending: Internal Medicine | Admitting: Internal Medicine

## 2012-11-27 ENCOUNTER — Emergency Department (HOSPITAL_COMMUNITY): Payer: Medicare Other

## 2012-11-27 ENCOUNTER — Encounter (HOSPITAL_COMMUNITY): Payer: Self-pay | Admitting: Emergency Medicine

## 2012-11-27 DIAGNOSIS — I129 Hypertensive chronic kidney disease with stage 1 through stage 4 chronic kidney disease, or unspecified chronic kidney disease: Secondary | ICD-10-CM | POA: Diagnosis present

## 2012-11-27 DIAGNOSIS — E785 Hyperlipidemia, unspecified: Secondary | ICD-10-CM

## 2012-11-27 DIAGNOSIS — G4700 Insomnia, unspecified: Secondary | ICD-10-CM

## 2012-11-27 DIAGNOSIS — K449 Diaphragmatic hernia without obstruction or gangrene: Secondary | ICD-10-CM | POA: Diagnosis present

## 2012-11-27 DIAGNOSIS — M940 Chondrocostal junction syndrome [Tietze]: Secondary | ICD-10-CM

## 2012-11-27 DIAGNOSIS — F32A Depression, unspecified: Secondary | ICD-10-CM

## 2012-11-27 DIAGNOSIS — D649 Anemia, unspecified: Secondary | ICD-10-CM

## 2012-11-27 DIAGNOSIS — K219 Gastro-esophageal reflux disease without esophagitis: Secondary | ICD-10-CM

## 2012-11-27 DIAGNOSIS — Q219 Congenital malformation of cardiac septum, unspecified: Secondary | ICD-10-CM

## 2012-11-27 DIAGNOSIS — M810 Age-related osteoporosis without current pathological fracture: Secondary | ICD-10-CM

## 2012-11-27 DIAGNOSIS — H409 Unspecified glaucoma: Secondary | ICD-10-CM

## 2012-11-27 DIAGNOSIS — I2510 Atherosclerotic heart disease of native coronary artery without angina pectoris: Secondary | ICD-10-CM | POA: Diagnosis present

## 2012-11-27 DIAGNOSIS — M889 Osteitis deformans of unspecified bone: Secondary | ICD-10-CM

## 2012-11-27 DIAGNOSIS — D510 Vitamin B12 deficiency anemia due to intrinsic factor deficiency: Secondary | ICD-10-CM

## 2012-11-27 DIAGNOSIS — R1013 Epigastric pain: Principal | ICD-10-CM

## 2012-11-27 DIAGNOSIS — F3289 Other specified depressive episodes: Secondary | ICD-10-CM

## 2012-11-27 DIAGNOSIS — N183 Chronic kidney disease, stage 3 unspecified: Secondary | ICD-10-CM

## 2012-11-27 DIAGNOSIS — R6889 Other general symptoms and signs: Secondary | ICD-10-CM

## 2012-11-27 DIAGNOSIS — E669 Obesity, unspecified: Secondary | ICD-10-CM

## 2012-11-27 DIAGNOSIS — F329 Major depressive disorder, single episode, unspecified: Secondary | ICD-10-CM

## 2012-11-27 DIAGNOSIS — M1710 Unilateral primary osteoarthritis, unspecified knee: Secondary | ICD-10-CM

## 2012-11-27 DIAGNOSIS — I428 Other cardiomyopathies: Secondary | ICD-10-CM

## 2012-11-27 DIAGNOSIS — R079 Chest pain, unspecified: Secondary | ICD-10-CM

## 2012-11-27 DIAGNOSIS — R0789 Other chest pain: Secondary | ICD-10-CM | POA: Diagnosis present

## 2012-11-27 DIAGNOSIS — I10 Essential (primary) hypertension: Secondary | ICD-10-CM

## 2012-11-27 DIAGNOSIS — D259 Leiomyoma of uterus, unspecified: Secondary | ICD-10-CM

## 2012-11-27 DIAGNOSIS — I447 Left bundle-branch block, unspecified: Secondary | ICD-10-CM | POA: Diagnosis present

## 2012-11-27 DIAGNOSIS — N3941 Urge incontinence: Secondary | ICD-10-CM

## 2012-11-27 DIAGNOSIS — E039 Hypothyroidism, unspecified: Secondary | ICD-10-CM

## 2012-11-27 DIAGNOSIS — M67919 Unspecified disorder of synovium and tendon, unspecified shoulder: Secondary | ICD-10-CM

## 2012-11-27 DIAGNOSIS — K921 Melena: Secondary | ICD-10-CM | POA: Diagnosis present

## 2012-11-27 DIAGNOSIS — F0390 Unspecified dementia without behavioral disturbance: Secondary | ICD-10-CM | POA: Diagnosis present

## 2012-11-27 DIAGNOSIS — IMO0002 Reserved for concepts with insufficient information to code with codable children: Secondary | ICD-10-CM

## 2012-11-27 DIAGNOSIS — F411 Generalized anxiety disorder: Secondary | ICD-10-CM

## 2012-11-27 DIAGNOSIS — I509 Heart failure, unspecified: Secondary | ICD-10-CM | POA: Diagnosis present

## 2012-11-27 DIAGNOSIS — D509 Iron deficiency anemia, unspecified: Secondary | ICD-10-CM | POA: Diagnosis present

## 2012-11-27 DIAGNOSIS — Z79899 Other long term (current) drug therapy: Secondary | ICD-10-CM

## 2012-11-27 DIAGNOSIS — G40909 Epilepsy, unspecified, not intractable, without status epilepticus: Secondary | ICD-10-CM | POA: Diagnosis present

## 2012-11-27 DIAGNOSIS — E209 Hypoparathyroidism, unspecified: Secondary | ICD-10-CM

## 2012-11-27 DIAGNOSIS — R110 Nausea: Secondary | ICD-10-CM

## 2012-11-27 HISTORY — DX: Other cardiomyopathies: I42.8

## 2012-11-27 HISTORY — DX: Left bundle-branch block, unspecified: I44.7

## 2012-11-27 HISTORY — DX: Osteitis deformans of unspecified bone: M88.9

## 2012-11-27 LAB — COMPREHENSIVE METABOLIC PANEL
AST: 25 U/L (ref 0–37)
Albumin: 3.1 g/dL — ABNORMAL LOW (ref 3.5–5.2)
Calcium: 8.9 mg/dL (ref 8.4–10.5)
Chloride: 108 mEq/L (ref 96–112)
Creatinine, Ser: 1.33 mg/dL — ABNORMAL HIGH (ref 0.50–1.10)

## 2012-11-27 LAB — URINALYSIS, ROUTINE W REFLEX MICROSCOPIC
Ketones, ur: NEGATIVE mg/dL
Nitrite: NEGATIVE
Protein, ur: NEGATIVE mg/dL
pH: 5 (ref 5.0–8.0)

## 2012-11-27 LAB — CBC
MCH: 32 pg (ref 26.0–34.0)
Platelets: 222 10*3/uL (ref 150–400)
RBC: 3.09 MIL/uL — ABNORMAL LOW (ref 3.87–5.11)

## 2012-11-27 LAB — URINE MICROSCOPIC-ADD ON

## 2012-11-27 LAB — POCT I-STAT TROPONIN I: Troponin i, poc: 0 ng/mL (ref 0.00–0.08)

## 2012-11-27 LAB — PRO B NATRIURETIC PEPTIDE: Pro B Natriuretic peptide (BNP): 193.6 pg/mL — ABNORMAL HIGH (ref 0–125)

## 2012-11-27 MED ORDER — BUPROPION HCL ER (XL) 150 MG PO TB24
150.0000 mg | ORAL_TABLET | Freq: Every morning | ORAL | Status: DC
  Administered 2012-11-28 – 2012-11-30 (×3): 150 mg via ORAL
  Filled 2012-11-27 (×3): qty 1

## 2012-11-27 MED ORDER — CARVEDILOL 12.5 MG PO TABS
12.5000 mg | ORAL_TABLET | Freq: Two times a day (BID) | ORAL | Status: DC
  Administered 2012-11-28 – 2012-11-30 (×6): 12.5 mg via ORAL
  Filled 2012-11-27 (×7): qty 1

## 2012-11-27 MED ORDER — LATANOPROST 0.005 % OP SOLN
1.0000 [drp] | Freq: Every day | OPHTHALMIC | Status: DC
  Administered 2012-11-27 – 2012-11-29 (×3): 1 [drp] via OPHTHALMIC
  Filled 2012-11-27: qty 2.5

## 2012-11-27 MED ORDER — MORPHINE SULFATE 4 MG/ML IJ SOLN
4.0000 mg | Freq: Once | INTRAMUSCULAR | Status: AC
  Administered 2012-11-27: 4 mg via INTRAVENOUS
  Filled 2012-11-27: qty 1

## 2012-11-27 MED ORDER — ZOLPIDEM TARTRATE 5 MG PO TABS
5.0000 mg | ORAL_TABLET | Freq: Every evening | ORAL | Status: DC | PRN
  Administered 2012-11-28: 5 mg via ORAL
  Filled 2012-11-27: qty 1

## 2012-11-27 MED ORDER — TEMAZEPAM 15 MG PO CAPS: 15.0000 mg | ORAL_CAPSULE | Freq: Every day | ORAL | Status: DC

## 2012-11-27 MED ORDER — LOPERAMIDE HCL 2 MG PO TABS
2.0000 mg | ORAL_TABLET | ORAL | Status: DC | PRN
  Filled 2012-11-27: qty 1

## 2012-11-27 MED ORDER — ENOXAPARIN SODIUM 80 MG/0.8ML ~~LOC~~ SOLN
80.0000 mg | SUBCUTANEOUS | Status: AC
  Administered 2012-11-27: 80 mg via SUBCUTANEOUS
  Filled 2012-11-27: qty 0.8

## 2012-11-27 MED ORDER — ONDANSETRON HCL 4 MG/2ML IJ SOLN
4.0000 mg | Freq: Three times a day (TID) | INTRAMUSCULAR | Status: AC | PRN
  Administered 2012-11-28: 4 mg via INTRAVENOUS
  Filled 2012-11-27: qty 2

## 2012-11-27 MED ORDER — PANTOPRAZOLE SODIUM 40 MG PO TBEC
40.0000 mg | DELAYED_RELEASE_TABLET | Freq: Every day | ORAL | Status: DC
  Administered 2012-11-27: 40 mg via ORAL
  Filled 2012-11-27: qty 1

## 2012-11-27 MED ORDER — ENOXAPARIN SODIUM 80 MG/0.8ML ~~LOC~~ SOLN
80.0000 mg | Freq: Two times a day (BID) | SUBCUTANEOUS | Status: DC
  Filled 2012-11-27 (×2): qty 0.8

## 2012-11-27 MED ORDER — SODIUM CHLORIDE 0.9 % IV BOLUS (SEPSIS)
500.0000 mL | Freq: Once | INTRAVENOUS | Status: AC
  Administered 2012-11-27: 500 mL via INTRAVENOUS

## 2012-11-27 MED ORDER — NITROGLYCERIN 0.4 MG SL SUBL
0.4000 mg | SUBLINGUAL_TABLET | SUBLINGUAL | Status: AC
  Administered 2012-11-27 (×2): 0.4 mg via SUBLINGUAL
  Filled 2012-11-27 (×2): qty 25

## 2012-11-27 MED ORDER — NITROGLYCERIN 2 % TD OINT
1.0000 [in_us] | TOPICAL_OINTMENT | Freq: Once | TRANSDERMAL | Status: DC
  Filled 2012-11-27: qty 30

## 2012-11-27 MED ORDER — FERROUS SULFATE 325 (65 FE) MG PO TABS
325.0000 mg | ORAL_TABLET | Freq: Every morning | ORAL | Status: DC
  Administered 2012-11-28 – 2012-11-29 (×2): 325 mg via ORAL
  Filled 2012-11-27 (×2): qty 1

## 2012-11-27 MED ORDER — LORAZEPAM 0.5 MG PO TABS
0.5000 mg | ORAL_TABLET | Freq: Three times a day (TID) | ORAL | Status: DC
  Administered 2012-11-27 – 2012-11-30 (×9): 0.5 mg via ORAL
  Filled 2012-11-27 (×9): qty 1

## 2012-11-27 MED ORDER — FENTANYL 25 MCG/HR TD PT72
75.0000 ug | MEDICATED_PATCH | TRANSDERMAL | Status: DC
  Administered 2012-11-27: 75 ug via TRANSDERMAL
  Filled 2012-11-27: qty 3

## 2012-11-27 MED ORDER — RISPERIDONE 1 MG PO TABS
1.0000 mg | ORAL_TABLET | Freq: Every evening | ORAL | Status: DC | PRN
  Administered 2012-11-27 – 2012-11-29 (×3): 1 mg via ORAL
  Filled 2012-11-27 (×7): qty 1

## 2012-11-27 MED ORDER — ADULT MULTIVITAMIN W/MINERALS CH
1.0000 | ORAL_TABLET | Freq: Every morning | ORAL | Status: DC
  Administered 2012-11-28 – 2012-11-29 (×2): 1 via ORAL
  Filled 2012-11-27 (×2): qty 1

## 2012-11-27 MED ORDER — LISINOPRIL 10 MG PO TABS
10.0000 mg | ORAL_TABLET | Freq: Every morning | ORAL | Status: DC
  Filled 2012-11-27: qty 1

## 2012-11-27 MED ORDER — FUROSEMIDE 20 MG PO TABS
20.0000 mg | ORAL_TABLET | Freq: Every morning | ORAL | Status: DC
  Administered 2012-11-28 – 2012-11-30 (×3): 20 mg via ORAL
  Filled 2012-11-27 (×3): qty 1

## 2012-11-27 MED ORDER — ASPIRIN 325 MG PO TABS
325.0000 mg | ORAL_TABLET | Freq: Once | ORAL | Status: DC
  Filled 2012-11-27: qty 1

## 2012-11-27 MED ORDER — ARIPIPRAZOLE 5 MG PO TABS
5.0000 mg | ORAL_TABLET | Freq: Every morning | ORAL | Status: DC
  Administered 2012-11-28 – 2012-11-30 (×3): 5 mg via ORAL
  Filled 2012-11-27 (×3): qty 1

## 2012-11-27 NOTE — ED Notes (Signed)
Pt on call bell reporting pain in substernal chest radiating to left arm. Dr. Micheline Maze aware. Nitro protocol started.

## 2012-11-27 NOTE — ED Notes (Signed)
Lab here to draw blood.

## 2012-11-27 NOTE — ED Notes (Signed)
Pt reports chest pain gone, some left arm pain remains.  Unable to give pain number "it just hurts".

## 2012-11-27 NOTE — ED Notes (Signed)
Pt alert, NAD, calm, interactive, resps e/u, speaking in clear complete sentences, watching TV, speaking on phone. C/o arm pain. IVF bolus infusing. VSS.

## 2012-11-27 NOTE — ED Provider Notes (Signed)
CSN: [id number]     Arrival date & time 11/27/12  1643 History   First MD Initiated Contact with Patient 11/27/12 1656     Chief Complaint  Patient presents with  . Chest Pain   (Consider location/radiation/quality/duration/timing/severity/associated sxs/prior Treatment) Patient is a 77 y.o. female presenting with chest pain.  Chest Pain Pain location:  Substernal area Pain quality: dull   Pain radiates to:  Does not radiate Pain radiates to the back: no   Pain severity:  Moderate Onset quality:  Sudden Duration: seconds for first episode, now lasting mins. Timing:  Intermittent Progression:  Waxing and waning Chronicity:  New Context: at rest   Relieved by:  Nothing Worsened by:  Nothing tried Ineffective treatments:  None tried Associated symptoms: cough and numbness   Associated symptoms: no abdominal pain, no back pain, no diaphoresis, no fatigue, no fever, no headache, no nausea, no near-syncope, no palpitations, no shortness of breath, not vomiting and no weakness   Associated symptoms comment:  Numbness L arm Cough:    Cough characteristics:  Non-productive   Severity:  Mild   Duration:  1 day   Timing:  Constant Risk factors: hypertension     Past Medical History  Diagnosis Date  . Dementia   . Arthritis   . Blood transfusion   . Hypertension   . Renal disorder   . Seizures   . Psychosis    Past Surgical History  Procedure Laterality Date  . Joint replacement    . Breast lumpectomy    . Cholecystectomy     History reviewed. No pertinent family history. History  Substance Use Topics  . Smoking status: Never Smoker   . Smokeless tobacco: Not on file  . Alcohol Use: No   OB History   Grav Para Term Preterm Abortions TAB SAB Ect Mult Living                 Review of Systems  Constitutional: Negative for fever, chills, diaphoresis, activity change, appetite change and fatigue.  HENT: Negative for congestion, facial swelling, rhinorrhea and sore  throat.   Eyes: Negative for photophobia and discharge.  Respiratory: Positive for cough. Negative for chest tightness and shortness of breath.   Cardiovascular: Positive for chest pain. Negative for palpitations, leg swelling and near-syncope.  Gastrointestinal: Negative for nausea, vomiting, abdominal pain and diarrhea.  Endocrine: Negative for polydipsia and polyuria.  Genitourinary: Negative for dysuria, frequency, difficulty urinating and pelvic pain.  Musculoskeletal: Negative for arthralgias, back pain, neck pain and neck stiffness.  Skin: Negative for color change and wound.  Allergic/Immunologic: Negative for immunocompromised state.  Neurological: Positive for numbness. Negative for facial asymmetry, weakness and headaches.  Hematological: Does not bruise/bleed easily.  Psychiatric/Behavioral: Negative for confusion and agitation.    Allergies  Codeine  Home Medications   No current outpatient prescriptions on file. BP 149/65  Pulse 76  Temp(Src) 97.8 F (36.6 C) (Oral)  Resp 18  Ht 5\' 4"  (1.626 m)  Wt 181 lb 14.4 oz (82.509 kg)  BMI 31.21 kg/m2  SpO2 98% Physical Exam  Constitutional: She is oriented to person, place, and time. She appears well-developed and well-nourished. No distress.  HENT:  Head: Normocephalic and atraumatic.  Mouth/Throat: No oropharyngeal exudate.  Eyes: Pupils are equal, round, and reactive to light.  Neck: Normal range of motion. Neck supple.  Cardiovascular: Normal rate, regular rhythm and normal heart sounds.  Exam reveals no gallop and no friction rub.   No murmur heard.  Pulmonary/Chest: Effort normal and breath sounds normal. No respiratory distress. She has no wheezes. She has no rales. She exhibits tenderness.    Abdominal: Soft. Bowel sounds are normal. She exhibits no distension and no mass. There is no tenderness. There is no rebound and no guarding.  Musculoskeletal: Normal range of motion. She exhibits no edema and no  tenderness.  Neurological: She is alert and oriented to person, place, and time.  Skin: Skin is warm and dry.  Psychiatric: She has a normal mood and affect.    ED Course  Procedures (including critical care time) Labs Review Labs Reviewed  PRO B NATRIURETIC PEPTIDE - Abnormal; Notable for the following:    Pro B Natriuretic peptide (BNP) 193.6 (*)    All other components within normal limits  CBC - Abnormal; Notable for the following:    RBC 3.09 (*)    Hemoglobin 9.9 (*)    HCT 29.6 (*)    All other components within normal limits  URINALYSIS, ROUTINE W REFLEX MICROSCOPIC - Abnormal; Notable for the following:    APPearance CLOUDY (*)    Leukocytes, UA SMALL (*)    All other components within normal limits  COMPREHENSIVE METABOLIC PANEL - Abnormal; Notable for the following:    BUN 28 (*)    Creatinine, Ser 1.33 (*)    Albumin 3.1 (*)    Total Bilirubin 0.2 (*)    GFR calc non Af Amer 38 (*)    GFR calc Af Amer 44 (*)    All other components within normal limits  URINE MICROSCOPIC-ADD ON - Abnormal; Notable for the following:    Squamous Epithelial / LPF FEW (*)    Bacteria, UA FEW (*)    All other components within normal limits  URINE CULTURE  TROPONIN I  TROPONIN I  POCT I-STAT TROPONIN I   Imaging Review Dg Chest 2 View  11/27/2012   CLINICAL DATA:  Chest pain and cough.  EXAM: CHEST  2 VIEW  COMPARISON:  05/31/2011  FINDINGS: There is mild hyperinflation which is similar to the previous examination. No focal airspace disease or pulmonary edema. Prominent density in the left suprahilar region is probably associated with the left anterior 1st rib. Heart and mediastinum are within normal limits. Bony thorax is intact. Degenerative changes in the thoracic spine.  IMPRESSION: No acute chest findings.   Electronically Signed   By: Richarda Overlie M.D.   On: 11/27/2012 18:19    EKG Interpretation     Ventricular Rate:    PR Interval:    QRS Duration:   QT Interval:      QTC Calculation:   R Axis:     Text Interpretation:             Date: 11/27/2012  Rate: 69  Rhythm: normal sinus rhythm  QRS Axis: normal  Intervals: prolonged QRS  ST/T Wave abnormalities: nonspecific ST/T changes and LBBB  Conduction Disutrbances:left bundle branch block  Narrative Interpretation:   Old EKG Reviewed: unchanged    MDM   1. Chest pain    Pt is a 77 y.o. female with Pmhx as above who presents with dull midsternal CP beginning just PTA at nursing facility and lasting a few seconds w/ L arm numbness, and sensation of tongue swelling. Pain returned just before my arrival in exam room, midsternal dull, w/o radiation. No assoc diaphoresis, SOB. Had had mild cough. She has also had intermittent LLQ pain w/ assoc nausea for months, had episode  last night and today, but denies pain currently, has benign abdominal exam. CP resolved after 3 SL NTG, but still having left arm pain.  IV morphine given.  First trop negative. No EKG changes, BNP not elevated, CXR unremarkable. Cardiology, Dr. Jacinto Halim, consulted who will admit pt, given ongoing symptoms, pt will be given Lovenox.          Shanna Cisco, MD 11/28/12 [phone]

## 2012-11-27 NOTE — ED Notes (Signed)
Report to Chris RN on 3 west.  

## 2012-11-27 NOTE — Progress Notes (Signed)
ANTICOAGULATION CONSULT NOTE - Initial Consult  Pharmacy Consult for lovenox Indication: chest pain/ACS  Allergies  Allergen Reactions  . Codeine Other (See Comments)    Makes her sick on her stomach.    Patient Measurements: weight 82 kg, height 64 inches (per patient)    Vital Signs: Temp: 97.8 F (36.6 C) (10/14 1650) Temp src: Oral (10/14 1650) BP: 96/62 mmHg (10/14 2200) Pulse Rate: 70 (10/14 2200)  Labs:  Recent Labs  11/27/12 1935  HGB 9.9*  HCT 29.6*  PLT 222  CREATININE 1.33*    CrCl is unknown because there is no height on file for the current visit.   Medical History: Past Medical History  Diagnosis Date  . Dementia   . Arthritis   . Blood transfusion   . Hypertension   . Renal disorder   . Seizures   . Psychosis     Medications:  See med rec  Assessment: Patient is a 77 y.o F presented to the ED from assisted living facility with c/o CP radiating to jaw and left arm numbness. To start lovenox for r/o ACS.  Goal of Therapy:  Anti-Xa level 0.6-1.2 Monitor platelets by anticoagulation protocol: Yes   Plan:  1) lovenox 80mg  SQ q12h 2) cbc q72hrs  Rosella Crandell P 11/27/2012,10:17 PM

## 2012-11-27 NOTE — ED Notes (Signed)
Pt from [facility] assisted [facility] with rided sided chest pain radiating to jaw and also states had left arm numbness. Pain lasted approx 2 min and resolved. Pt received 324mg  ASA PTA from NH. Denies pain at present. Has 18g in LAC.

## 2012-11-27 NOTE — ED Notes (Signed)
Pt given a blanket.  She is c/o a headache and lt shoulder pain minimally

## 2012-11-28 ENCOUNTER — Inpatient Hospital Stay (HOSPITAL_COMMUNITY): Payer: Medicare Other

## 2012-11-28 DIAGNOSIS — R1013 Epigastric pain: Principal | ICD-10-CM

## 2012-11-28 DIAGNOSIS — R079 Chest pain, unspecified: Secondary | ICD-10-CM

## 2012-11-28 DIAGNOSIS — I10 Essential (primary) hypertension: Secondary | ICD-10-CM

## 2012-11-28 LAB — LIPASE, BLOOD: Lipase: 15 U/L (ref 11–59)

## 2012-11-28 LAB — VITAMIN B12: Vitamin B-12: 298 pg/mL (ref 211–911)

## 2012-11-28 LAB — IRON AND TIBC
Iron: 78 ug/dL (ref 42–135)
UIBC: 148 ug/dL (ref 125–400)

## 2012-11-28 LAB — RETICULOCYTES
RBC.: 3.22 MIL/uL — ABNORMAL LOW (ref 3.87–5.11)
Retic Count, Absolute: 48.3 10*3/uL (ref 19.0–186.0)

## 2012-11-28 LAB — FOLATE: Folate: >20 ng/mL

## 2012-11-28 LAB — FERRITIN: Ferritin: 132 ng/mL (ref 10–291)

## 2012-11-28 LAB — TROPONIN I: Troponin I: <0.3 ng/mL (ref <0.30)

## 2012-11-28 MED ORDER — ACETAMINOPHEN 325 MG PO TABS: 650.0000 mg | ORAL_TABLET | Freq: Four times a day (QID) | ORAL | Status: DC | PRN

## 2012-11-28 MED ORDER — SODIUM CHLORIDE 0.9 % IJ SOLN: 3.0000 mL | INTRAMUSCULAR | Status: DC | PRN

## 2012-11-28 MED ORDER — SODIUM CHLORIDE 0.9 % IJ SOLN
3.0000 mL | Freq: Two times a day (BID) | INTRAMUSCULAR | Status: DC
  Administered 2012-11-28 – 2012-11-30 (×3): 3 mL via INTRAVENOUS

## 2012-11-28 MED ORDER — SODIUM CHLORIDE 0.9 % IV SOLN: 250.0000 mL | INTRAVENOUS | Status: DC | PRN

## 2012-11-28 MED ORDER — ONDANSETRON HCL 4 MG PO TABS
4.0000 mg | ORAL_TABLET | Freq: Four times a day (QID) | ORAL | Status: DC | PRN
  Administered 2012-11-29: 4 mg via ORAL
  Filled 2012-11-28: qty 1

## 2012-11-28 MED ORDER — ONDANSETRON HCL 4 MG/2ML IJ SOLN: 4.0000 mg | Freq: Four times a day (QID) | INTRAMUSCULAR | Status: DC | PRN

## 2012-11-28 MED ORDER — IOHEXOL 300 MG/ML  SOLN
25.0000 mL | INTRAMUSCULAR | Status: AC
  Administered 2012-11-28 (×2): 25 mL via ORAL

## 2012-11-28 MED ORDER — ACETAMINOPHEN 650 MG RE SUPP: 650.0000 mg | Freq: Four times a day (QID) | RECTAL | Status: DC | PRN

## 2012-11-28 MED ORDER — ENOXAPARIN SODIUM 40 MG/0.4ML ~~LOC~~ SOLN
40.0000 mg | SUBCUTANEOUS | Status: DC
  Filled 2012-11-28: qty 0.4

## 2012-11-28 MED ORDER — SODIUM CHLORIDE 0.9 % IJ SOLN
3.0000 mL | Freq: Two times a day (BID) | INTRAMUSCULAR | Status: DC
  Administered 2012-11-28 – 2012-11-29 (×2): 3 mL via INTRAVENOUS

## 2012-11-28 MED ORDER — HYDROCODONE-ACETAMINOPHEN 5-325 MG PO TABS
1.0000 | ORAL_TABLET | ORAL | Status: DC | PRN
  Administered 2012-11-28 (×2): 1 via ORAL
  Administered 2012-11-29 (×2): 2 via ORAL
  Administered 2012-11-29: 1 via ORAL
  Administered 2012-11-30: 2 via ORAL
  Filled 2012-11-28 (×2): qty 1
  Filled 2012-11-28 (×2): qty 2
  Filled 2012-11-28: qty 1
  Filled 2012-11-28: qty 2

## 2012-11-28 MED ORDER — ENOXAPARIN SODIUM 30 MG/0.3ML ~~LOC~~ SOLN: 30.0000 mg | SUBCUTANEOUS | Status: DC

## 2012-11-28 MED ORDER — PANTOPRAZOLE SODIUM 40 MG IV SOLR
40.0000 mg | Freq: Two times a day (BID) | INTRAVENOUS | Status: DC
  Administered 2012-11-28 – 2012-11-29 (×3): 40 mg via INTRAVENOUS
  Filled 2012-11-28 (×4): qty 40

## 2012-11-28 NOTE — Consult Note (Signed)
Subjective:   HPI  The patient is a 77 year old female with a history of coronary artery disease, congestive heart failure, and hypertension who came to the emergency room with complaints of chest pain. She was admitted to the hospital. The chest pain started yesterday and she states it went into her left shoulder. She does not have pain at this time. She has also been experiencing some epigastric and mid abdominal pain for the past month. It seems to hurt worse after eating. She has been having loose stools recently. She does not see blood in the stool. She takes iron and therefore her stools are dark in color. She is scheduled to have a CT of the abdomen today. She is scheduled for an echocardiogram today.  Review of Systems Chest pain has resolved. No shortness of breath  Past Medical History  Diagnosis Date  . Dementia   . Arthritis   . Blood transfusion   . Hypertension   . Renal disorder   . Seizures   . Psychosis    Past Surgical History  Procedure Laterality Date  . Joint replacement    . Breast lumpectomy    . Cholecystectomy     History   Social History  . Marital Status: Divorced    Spouse Name: N/A    Number of Children: N/A  . Years of Education: N/A   Occupational History  . Not on file.   Social History Main Topics  . Smoking status: Never Smoker   . Smokeless tobacco: Not on file  . Alcohol Use: No  . Drug Use: No  . Sexual Activity:    Other Topics Concern  . Not on file   Social History Narrative  . No narrative on file   family history is not on file. Current facility-administered medications:0.9 %  sodium chloride infusion, 250 mL, Intravenous, PRN, Belkys A Regalado, MD;  acetaminophen (TYLENOL) suppository 650 mg, 650 mg, Rectal, Q6H PRN, Belkys A Regalado, MD;  acetaminophen (TYLENOL) tablet 650 mg, 650 mg, Oral, Q6H PRN, Belkys A Regalado, MD;  ARIPiprazole (ABILIFY) tablet 5 mg, 5 mg, Oral, q morning - 10a, Shanna Cisco, MD, 5 mg at  11/28/12 1019 buPROPion (WELLBUTRIN XL) 24 hr tablet 150 mg, 150 mg, Oral, q morning - 10a, Shanna Cisco, MD, 150 mg at 11/28/12 1010;  carvedilol (COREG) tablet 12.5 mg, 12.5 mg, Oral, BID WC, Shanna Cisco, MD, 12.5 mg at 11/28/12 1010;  fentaNYL (DURAGESIC - dosed mcg/hr) patch 75 mcg, 75 mcg, Transdermal, Q72H, Shanna Cisco, MD, 75 mcg at 11/27/12 2325 ferrous sulfate tablet 325 mg, 325 mg, Oral, q morning - 10a, Shanna Cisco, MD, 325 mg at 11/28/12 1010;  furosemide (LASIX) tablet 20 mg, 20 mg, Oral, q morning - 10a, Shanna Cisco, MD, 20 mg at 11/28/12 1010;  HYDROcodone-acetaminophen (NORCO/VICODIN) 5-325 MG per tablet 1-2 tablet, 1-2 tablet, Oral, Q4H PRN, Belkys A Regalado, MD iohexol (OMNIPAQUE) 300 MG/ML solution 25 mL, 25 mL, Oral, Q1 Hr x 2, Medication Radiologist, MD, 25 mL at 11/28/12 0944;  latanoprost (XALATAN) 0.005 % ophthalmic solution 1 drop, 1 drop, Both Eyes, QHS, Shanna Cisco, MD, 1 drop at 11/27/12 2326;  LORazepam (ATIVAN) tablet 0.5 mg, 0.5 mg, Oral, TID, Shanna Cisco, MD, 0.5 mg at 11/28/12 1011 multivitamin with minerals tablet 1 tablet, 1 tablet, Oral, q morning - 10a, Shanna Cisco, MD, 1 tablet at 11/28/12 1010;  ondansetron (ZOFRAN) injection 4 mg, 4 mg, Intravenous, Q6H PRN,  Belkys A Regalado, MD;  ondansetron (ZOFRAN) tablet 4 mg, 4 mg, Oral, Q6H PRN, Belkys A Regalado, MD;  pantoprazole (PROTONIX) injection 40 mg, 40 mg, Intravenous, Q12H, Belkys A Regalado, MD, 40 mg at 11/28/12 1011 risperiDONE (RISPERDAL) tablet 1 mg, 1 mg, Oral, QHS,MR X 1, Shanna Cisco, MD, 1 mg at 11/27/12 2326;  sodium chloride 0.9 % injection 3 mL, 3 mL, Intravenous, Q12H, Belkys A Regalado, MD;  sodium chloride 0.9 % injection 3 mL, 3 mL, Intravenous, Q12H, Belkys A Regalado, MD, 3 mL at 11/28/12 1011;  sodium chloride 0.9 % injection 3 mL, 3 mL, Intravenous, PRN, Belkys A Regalado, MD zolpidem (AMBIEN) tablet 5 mg, 5 mg, Oral, QHS PRN, Pamella Pert, MD, 5 mg at  11/28/12 0117 Allergies  Allergen Reactions  . Codeine Other (See Comments)    Makes her sick on her stomach.     Objective:     BP 111/69  Pulse 69  Temp(Src) 98.2 F (36.8 C) (Oral)  Resp 18  Ht 5\' 4"  (1.626 m)  Wt 82.509 kg (181 lb 14.4 oz)  BMI 31.21 kg/m2  SpO2 95%  She is in no distress  Nonicteric  Heart regular rhythm no murmurs  Lungs clear  Abdomen: Bowel sounds normal, soft, nontender, no masses  Laboratory No components found with this basename: d1      Assessment:     #1. Chest pain etiology unclear  #2. Abdominal pain etiology unclear      Plan:     Proceed with CT abdomen today. Proceed with echocardiogram today. Review findings and followup with patient tomorrow to see if further diagnostic testing will be in order. In reviewing prior records it is noted that in 2011 she was seen by me for anemia. EGD and colonoscopy were planned, but patient no showed for her procedures. Attempts to reschedule were unsuccessful and despite our attempts to reschedule she refused.  Lab Results  Component Value Date   HGB 9.9* 11/27/2012   HGB 11.2* 05/28/2011   HGB 10.4* 05/26/2011   HCT 29.6* 11/27/2012   HCT 33.7* 05/28/2011   HCT 31.8* 05/26/2011   ALKPHOS 95 11/27/2012   ALKPHOS 105 05/28/2011   ALKPHOS 103 05/26/2011   AST 25 11/27/2012   AST 23 05/28/2011   AST 22 05/26/2011   ALT 13 11/27/2012   ALT 13 05/28/2011   ALT 13 05/26/2011

## 2012-11-28 NOTE — Consult Note (Signed)
[facility] History and Physical  Jennifer [name] ZOX:[id number] DOB: 27-Jan-1936 DOA: 11/27/2012  Referring physician: Dr Jacinto Halim PCP: Pcp Not In System  Specialists:Dr tilly  Chief Complaint: chest pain.   HPI: Jennifer [name] is a 77 y.o. female with PMH CAD Heart Failure EF 35 %, hypertension, who presents to ED complaining of chest pain that started yesterday. Chest pain has resolved at this time. She also report epigastric, mid abdomen pain for last month, associated with nausea. Abdominal pain worse with meals. She report melena since she has been taking iron table.   Patient was evaluated by Dr Jacinto Halim who feels chest pain is non cardiac.  I have assume care for patient. I have consulted Dr Tresa Endo as per patient request.    Review of Systems: negative except as per HPI.   Past Medical History  Diagnosis Date  . Dementia   . Arthritis   . Blood transfusion   . Hypertension   . Renal disorder   . Seizures   . Psychosis    Past Surgical History  Procedure Laterality Date  . Joint replacement    . Breast lumpectomy    . Cholecystectomy     Social History:  reports that she has never smoked. She does not have any smokeless tobacco history on file. She reports that she does not drink alcohol or use illicit drugs.   Allergies  Allergen Reactions  . Codeine Other (See Comments)    Makes her sick on her stomach.    Family history;   Prior to Admission medications   Medication Sig Start Date End Date Taking? Authorizing Provider  ARIPiprazole (ABILIFY) 5 MG tablet Take 5 mg by mouth every morning.   Yes Historical Provider, MD  buPROPion (WELLBUTRIN XL) 150 MG 24 hr tablet Take 150 mg by mouth every morning.   Yes Historical Provider, MD  carvedilol (COREG) 12.5 MG tablet Take 12.5 mg by mouth 2 (two) times daily with a meal.   Yes Historical Provider, MD  diclofenac sodium (VOLTAREN) 1 % GEL Apply 2 g topically 2 (two) times daily.   Yes Historical Provider, MD  fentaNYL  (DURAGESIC - DOSED MCG/HR) 75 MCG/HR Place 1 patch onto the skin every 3 (three) days.   Yes Historical Provider, MD  ferrous sulfate 325 (65 FE) MG tablet Take 325 mg by mouth every morning.   Yes Historical Provider, MD  furosemide (LASIX) 20 MG tablet Take 20 mg by mouth every morning.   Yes Historical Provider, MD  latanoprost (XALATAN) 0.005 % ophthalmic solution Place 1 drop into both eyes at bedtime.   Yes Historical Provider, MD  lisinopril (PRINIVIL,ZESTRIL) 10 MG tablet Take 10 mg by mouth every morning.   Yes Historical Provider, MD  loperamide (IMODIUM A-D) 2 MG tablet Take 2 mg by mouth every 2 (two) hours as needed for diarrhea or loose stools. (max 16mg Luvenia Heller)   Yes Historical Provider, MD  LORazepam (ATIVAN) 0.5 MG tablet Take 0.5 mg by mouth 3 (three) times daily.   Yes Historical Provider, MD  Multiple Vitamin (MULTIVITAMIN WITH MINERALS) TABS tablet Take 1 tablet by mouth every morning.   Yes Historical Provider, MD  pantoprazole (PROTONIX) 40 MG tablet Take 40 mg by mouth daily.   Yes Historical Provider, MD  risperiDONE (RISPERDAL) 1 MG tablet Take 1 mg by mouth at bedtime and may repeat dose one time if needed.   Yes Historical Provider, MD  temazepam (RESTORIL) 15 MG capsule Take 15 mg by mouth at bedtime.  Yes Historical Provider, MD  traMADol (ULTRAM) 50 MG tablet Take 100 mg by mouth at bedtime.   Yes Historical Provider, MD   Physical Exam: Filed Vitals:   11/28/12 0511  BP: 108/67  Pulse: 75  Temp: 98.2 F (36.8 C)  Resp: 18     Labs on Admission:  Basic Metabolic Panel:  Recent Labs Lab 11/27/12 1935  NA 142  K 4.3  CL 108  CO2 25  GLUCOSE 86  BUN 28*  CREATININE 1.33*  CALCIUM 8.9   Liver Function Tests:  Recent Labs Lab 11/27/12 1935  AST 25  ALT 13  ALKPHOS 95  BILITOT 0.2*  PROT 7.1  ALBUMIN 3.1*   No results found for this basename: LIPASE, AMYLASE,  in the last 168 hours No results found for this basename: AMMONIA,  in the last  168 hours CBC:  Recent Labs Lab 11/27/12 1935  WBC 5.0  HGB 9.9*  HCT 29.6*  MCV 95.8  PLT 222   Cardiac Enzymes:  Recent Labs Lab 11/28/12 11/28/12 0501  TROPONINI <0.30 <0.30    BNP (last 3 results)  Recent Labs  11/27/12 1935  PROBNP 193.6*   CBG: No results found for this basename: GLUCAP,  in the last 168 hours  Radiological Exams on Admission: Dg Chest 2 View  11/27/2012   CLINICAL DATA:  Chest pain and cough.  EXAM: CHEST  2 VIEW  COMPARISON:  05/31/2011  FINDINGS: There is mild hyperinflation which is similar to the previous examination. No focal airspace disease or pulmonary edema. Prominent density in the left suprahilar region is probably associated with the left anterior 1st rib. Heart and mediastinum are within normal limits. Bony thorax is intact. Degenerative changes in the thoracic spine.  IMPRESSION: No acute chest findings.   Electronically Signed   By: Richarda Overlie M.D.   On: 11/27/2012 18:19    EKG: Independently reviewed. Left BBB.   Assessment/Plan Principal Problem:   Chest pain Active Problems:   Pernicious anemia   HYPERTENSION, BENIGN SYSTEMIC   RENAL INSUFFICIENCY, CHRONIC   Abdominal pain, epigastric  1-Chest pain: pain has resolved. Troponin times 2 negative. EKG with old left BBB. Order ECHO. Dr Tresa Endo associate to see patient.   2-Abdominal pain, nausea, melena: will order lipase, CT abdomen with oral contrast only due to renal insufficiency. Will order guaiac stool, anemia panel. Dr Ewing Schlein consulted. No transaminases.   3-HTN: Continue with coreg and lasix.   4-Non ischemic cardiomyopathy: will repeat ECHO. Resume ACE if renal function stable. Continue with lasix.  5-Anemia: history of pernicious anemia. Check anemia panel. Guaiac stool due to history of melena. GI consulted. Last Hb per records was at 11 in 2013. Hb on admission at 9.    Code Status: presume full code Family Communication: Care discussed with patient.   Disposition Plan: expect 2 days.   Time spent: 65 minutes.   Kahli Mayon [facility] Pager [phone]  If 7PM-7AM, please contact night-coverage [email or web address] Password [facility] 11/28/2012, 9:23 AM

## 2012-11-28 NOTE — H&P (Addendum)
Jennifer [name] is an 77 y.o. female.   Chief Complaint: Chest pain HPI: Patient is a 86 African American female with history of mild dementia, history of chronic anemia requiring blood transfusions in the past, history of seizure disorder was doing well until yesterday they were playing bridge at the nursing home, she developed chest pain and also complained about left arm tingling and numbness and neck pain. With the diagnosis of chest pain shows brought to the emergency room. She received aspirin and she also received sublingual nitroglycerin with relief of chest pain. Patient states that she was having chest pain while in the nursing home, after receiving aspirin she had felt better but when the EMS picked her up she sat having chest pain again. She has chronic left bundle branch block hence no EKG abnormalities were found in the emergency room, but because of chest pain with it symptoms suggestive of left arm tingling and numbness I was asked to see and evaluate the patient. Patient was chest pain-free when I was called.  This morning patient states that she still having upper abdominal lower chest pain. She states that she's been complaining about this past several weeks. She states that she doesn't feel like eating as it hurts her chest and abdomen and she also has been complaining about nausea for quite some time.  Yesterday's chest pain was described as the without any radiation.  She also complains of neck pain. She states that turning her neck certain a feels sharp pain. Yesterday she had sharp pains in the left neck area and this morning she had sharp pain in the right neck area. She has also been complaining about left arm tingling and numbness for quite some time. She is also complaining about nausea associated with abdominal discomfort. She states that she does have dark stools but attributes this to taking "pills". No recent weight change, no PND, orthopnea.  Past Medical History  Diagnosis  Date  . Dementia   . Arthritis   . Blood transfusion   . Hypertension   . Renal disorder   . Seizures   . Psychosis     Past Surgical History  Procedure Laterality Date  . Joint replacement    . Breast lumpectomy    . Cholecystectomy      History reviewed. No pertinent family history. Social History:  reports that she has never smoked. She does not have any smokeless tobacco history on file. She reports that she does not drink alcohol or use illicit drugs.  Allergies:  Allergies  Allergen Reactions  . Codeine Other (See Comments)    Makes her sick on her stomach.    Medications Prior to Admission  Medication Sig Dispense Refill  . ARIPiprazole (ABILIFY) 5 MG tablet Take 5 mg by mouth every morning.      [name] buPROPion (WELLBUTRIN XL) 150 MG 24 hr tablet Take 150 mg by mouth every morning.      . carvedilol (COREG) 12.5 MG tablet Take 12.5 mg by mouth 2 (two) times daily with a meal.      . diclofenac sodium (VOLTAREN) 1 % GEL Apply 2 g topically 2 (two) times daily.      . fentaNYL (DURAGESIC - DOSED MCG/HR) 75 MCG/HR Place 1 patch onto the skin every 3 (three) days.      . ferrous sulfate 325 (65 FE) MG tablet Take 325 mg by mouth every morning.      . furosemide (LASIX) 20 MG tablet Take 20 mg  by mouth every morning.      . latanoprost (XALATAN) 0.005 % ophthalmic solution Place 1 drop into both eyes at bedtime.      [name] lisinopril (PRINIVIL,ZESTRIL) 10 MG tablet Take 10 mg by mouth every morning.      . loperamide (IMODIUM A-D) 2 MG tablet Take 2 mg by mouth every 2 (two) hours as needed for diarrhea or loose stools. (max 16mg Luvenia Heller)      . LORazepam (ATIVAN) 0.5 MG tablet Take 0.5 mg by mouth 3 (three) times daily.      . Multiple Vitamin (MULTIVITAMIN WITH MINERALS) TABS tablet Take 1 tablet by mouth every morning.      . pantoprazole (PROTONIX) 40 MG tablet Take 40 mg by mouth daily.      . risperiDONE (RISPERDAL) 1 MG tablet Take 1 mg by mouth at bedtime and may repeat  dose one time if needed.      . temazepam (RESTORIL) 15 MG capsule Take 15 mg by mouth at bedtime.      . traMADol (ULTRAM) 50 MG tablet Take 100 mg by mouth at bedtime.       Review of Systems - Negative except Dementia, history of seizure disorder, felt that she would have a seizure yesterday. Please see history of present illness. Other systems negative.  Blood pressure 108/67, pulse 75, temperature 98.2 F (36.8 C), temperature source Oral, resp. rate 18, height 5\' 4"  (1.626 m), weight 82.509 kg (181 lb 14.4 oz), SpO2 95.00%. General appearance: alert, cooperative and appears stated age Eyes: negative findings: lids and lashes normal Neck: no adenopathy, no carotid bruit, no JVD, supple, symmetrical, trachea midline and thyroid not enlarged, symmetric, no tenderness/mass/nodules Neck: JVP - normal, carotids 2+= without bruits Resp: clear to auscultation bilaterally Chest wall: no tenderness Cardio: regular rate and rhythm, S1, S2 normal, no murmur, click, rub or gallop GI: Mild epigastric tenderness is present. Bowel sounds heard in all 4 quadrants. No organomegaly. Extremities: extremities normal, atraumatic, no cyanosis or edema Pulses: 2+ and symmetric Skin: Skin color, texture, turgor normal. No rashes or lesions Neurologic: Grossly normal  Results for orders placed during the hospital encounter of 11/27/12 (from the past 48 hour(s))  PRO B NATRIURETIC PEPTIDE     Status: Abnormal   Collection Time    11/27/12  7:35 PM      Result Value Range   Pro B Natriuretic peptide (BNP) 193.6 (*) 0 - 125 pg/mL  CBC     Status: Abnormal   Collection Time    11/27/12  7:35 PM      Result Value Range   WBC 5.0  4.0 - 10.5 K/uL   RBC 3.09 (*) 3.87 - 5.11 MIL/uL   Hemoglobin 9.9 (*) 12.0 - 15.0 g/dL   HCT 29.5 (*) 62.1 - 30.8 %   MCV 95.8  78.0 - 100.0 fL   MCH 32.0  26.0 - 34.0 pg   MCHC 33.4  30.0 - 36.0 g/dL   RDW 65.7  84.6 - 96.2 %   Platelets 222  150 - 400 K/uL  COMPREHENSIVE  METABOLIC PANEL     Status: Abnormal   Collection Time    11/27/12  7:35 PM      Result Value Range   Sodium 142  135 - 145 mEq/L   Potassium 4.3  3.5 - 5.1 mEq/L   Chloride 108  96 - 112 mEq/L   CO2 25  19 - 32 mEq/L   Glucose, Bld 86  70 - 99 mg/dL   BUN 28 (*) 6 - 23 mg/dL   Creatinine, Ser 4.09 (*) 0.50 - 1.10 mg/dL   Calcium 8.9  8.4 - 81.1 mg/dL   Total Protein 7.1  6.0 - 8.3 g/dL   Albumin 3.1 (*) 3.5 - 5.2 g/dL   AST 25  0 - 37 U/L   ALT 13  0 - 35 U/L   Alkaline Phosphatase 95  39 - 117 U/L   Total Bilirubin 0.2 (*) 0.3 - 1.2 mg/dL   GFR calc non Af Amer 38 (*) >90 mL/min   GFR calc Af Amer 44 (*) >90 mL/min   Comment: (NOTE)     The eGFR has been calculated using the CKD EPI equation.     This calculation has not been validated in all clinical situations.     eGFR's persistently <90 mL/min signify possible Chronic Kidney     Disease.  POCT I-STAT TROPONIN I     Status: None   Collection Time    11/27/12  7:47 PM      Result Value Range   Troponin i, poc 0.00  0.00 - 0.08 ng/mL   Comment 3            Comment: Due to the release kinetics of cTnI,     a negative result within the first hours     of the onset of symptoms does not rule out     myocardial infarction with certainty.     If myocardial infarction is still suspected,     repeat the test at appropriate intervals.  URINALYSIS, ROUTINE W REFLEX MICROSCOPIC     Status: Abnormal   Collection Time    11/27/12  8:24 PM      Result Value Range   Color, Urine YELLOW  YELLOW   APPearance CLOUDY (*) CLEAR   Specific Gravity, Urine 1.013  1.005 - 1.030   pH 5.0  5.0 - 8.0   Glucose, UA NEGATIVE  NEGATIVE mg/dL   Hgb urine dipstick NEGATIVE  NEGATIVE   Bilirubin Urine NEGATIVE  NEGATIVE   Ketones, ur NEGATIVE  NEGATIVE mg/dL   Protein, ur NEGATIVE  NEGATIVE mg/dL   Urobilinogen, UA 0.2  0.0 - 1.0 mg/dL   Nitrite NEGATIVE  NEGATIVE   Leukocytes, UA SMALL (*) NEGATIVE  URINE MICROSCOPIC-ADD ON     Status:  Abnormal   Collection Time    11/27/12  8:24 PM      Result Value Range   Squamous Epithelial / LPF FEW (*) RARE   WBC, UA 3-6  <3 WBC/hpf   RBC / HPF 0-2  <3 RBC/hpf   Bacteria, UA FEW (*) RARE  TROPONIN I     Status: None   Collection Time    11/28/12 12:00 AM      Result Value Range   Troponin I <0.30  <0.30 ng/mL   Comment:            Due to the release kinetics of cTnI,     a negative result within the first hours     of the onset of symptoms does not rule out     myocardial infarction with certainty.     If myocardial infarction is still suspected,     repeat the test at appropriate intervals.  TROPONIN I     Status: None   Collection Time    11/28/12  5:01 AM      Result Value Range  Troponin I <0.30  <0.30 ng/mL   Comment:            Due to the release kinetics of cTnI,     a negative result within the first hours     of the onset of symptoms does not rule out     myocardial infarction with certainty.     If myocardial infarction is still suspected,     repeat the test at appropriate intervals.   Dg Chest 2 View  11/27/2012   CLINICAL DATA:  Chest pain and cough.  EXAM: CHEST  2 VIEW  COMPARISON:  05/31/2011  FINDINGS: There is mild hyperinflation which is similar to the previous examination. No focal airspace disease or pulmonary edema. Prominent density in the left suprahilar region is probably associated with the left anterior 1st rib. Heart and mediastinum are within normal limits. Bony thorax is intact. Degenerative changes in the thoracic spine.  IMPRESSION: No acute chest findings.   Electronically Signed   By: Richarda Overlie M.D.   On: 11/27/2012 18:19    Labs:   Lab Results  Component Value Date   WBC 5.0 11/27/2012   HGB 9.9* 11/27/2012   HCT 29.6* 11/27/2012   MCV 95.8 11/27/2012   PLT 222 11/27/2012    Recent Labs Lab 11/27/12 1935  NA 142  K 4.3  CL 108  CO2 25  BUN 28*  CREATININE 1.33*  CALCIUM 8.9  PROT 7.1  BILITOT 0.2*  ALKPHOS 95   ALT 13  AST 25  GLUCOSE 86   Lab Results  Component Value Date   CKTOTAL 67 04/18/2011   CKMB 1.4 04/18/2011   TROPONINI <0.30 11/28/2012    Cardiac Panel (last 3 results)  Recent Labs  11/28/12 11/28/12 0501  TROPONINI <0.30 <0.30    EKG: unchanged from previous tracings, LBBB, no further analysis due to underlying left block..  Scheduled Meds: . ARIPiprazole  5 mg Oral q morning - 10a  . aspirin  325 mg Oral Once  . buPROPion  150 mg Oral q morning - 10a  . carvedilol  12.5 mg Oral BID WC  . enoxaparin (LOVENOX) injection  80 mg Subcutaneous Q12H  . fentaNYL  75 mcg Transdermal Q72H  . ferrous sulfate  325 mg Oral q morning - 10a  . furosemide  20 mg Oral q morning - 10a  . latanoprost  1 drop Both Eyes QHS  . lisinopril  10 mg Oral q morning - 10a  . LORazepam  0.5 mg Oral TID  . multivitamin with minerals  1 tablet Oral q morning - 10a  . nitroGLYCERIN  1 inch Topical Once  . pantoprazole  40 mg Oral Daily  . risperiDONE  1 mg Oral QHS,MR X 1   Continuous Infusions:  PRN Meds:.loperamide, ondansetron (ZOFRAN) IV, zolpidem   Assessment/Plan 1. Chest pain probably of noncardiac etiology, patient having significant abdominal discomfort, epigastric tenderness, patient complained about nausea, dark stools, suspect GI etiology. 2. Neck pain, suspect radicular pain radiating to the left arm, sharp pains that is made worse and by position. Chronic left arm tingling and numbness. 3. Left bundle branch block, chronic, negative cardiac markers this morning. 4. Hypertension 5. Non ischemic cardiomyopathy by coronary angiogram on 05/15/2002 by Dr. Daphene Jaeger, ejection fraction around 35%. Presently clinically there is no evidence of congestive heart failure. Patient denies shortness of breath.  Recommendation: Patient probably needs further evaluation from GI standpoint as she continues to have nausea and abdominal  discomfort. I do not think this is cardiac etiology. Because the  hospitalist service to further evaluate. Patient also request Dr. Daphene Jaeger to be her cardiologist, states that she is seen in the past. Last night they team was contacted but they did not have any information and I was given the consult. I be happy to continue to follow her if Dr. Tresa Endo is not aware of the patient or if he wants me to follow her I be happy to. Pamella Pert, MD 11/28/2012, 8:30 AM [facility] Pager: [phone] Office: [phone] If no answer: Cell:  [phone]

## 2012-11-28 NOTE — Progress Notes (Signed)
Clinical Social Work Department BRIEF PSYCHOSOCIAL ASSESSMENT 11/28/2012  Patient:  Jennifer [name],Jennifer [name]     Account Number:  [id number]     Admit date:  11/27/2012  Clinical Social Worker:  Harless Nakayama  Date/Time:  11/28/2012 10:00 AM  Referred by:  Physician  Date Referred:  11/28/2012 Referred for  ALF Placement   Other Referral:   Interview type:  Family Other interview type:   Spoke with pt granddaughter on the phone    PSYCHOSOCIAL DATA Living Status:  FACILITY Admitted from facility:  [name] Level of care:  Assisted Living Primary support name:  Jennifer [name] [phone] Primary support relationship to patient:  FAMILY Degree of support available:   Pt has supportive grandchildren    CURRENT CONCERNS Current Concerns  Post-Acute Placement   Other Concerns:    SOCIAL WORK ASSESSMENT / PLAN CSW informed that pt was admitted from [facility]. CSW called pt granddaugther, Jennifer [name], who is listed on facesheet to confirm. Pt family confirms pt is from ALF and plan is to return there if appopriate. CSW called facility and left message to confirm pt is able to return when medically stable.   Assessment/plan status:  Psychosocial Support/Ongoing Assessment of Needs Other assessment/ plan:   Information/referral to community resources:   None needed    PATIENT'S/FAMILY'S RESPONSE TO PLAN OF CARE: Pt family agreeable to plan to return to ALF       [place], LCSWA [phone]

## 2012-11-29 ENCOUNTER — Encounter (HOSPITAL_COMMUNITY): Payer: Self-pay | Admitting: Cardiology

## 2012-11-29 DIAGNOSIS — K219 Gastro-esophageal reflux disease without esophagitis: Secondary | ICD-10-CM | POA: Diagnosis present

## 2012-11-29 DIAGNOSIS — D649 Anemia, unspecified: Secondary | ICD-10-CM

## 2012-11-29 DIAGNOSIS — F329 Major depressive disorder, single episode, unspecified: Secondary | ICD-10-CM | POA: Diagnosis present

## 2012-11-29 DIAGNOSIS — R110 Nausea: Secondary | ICD-10-CM | POA: Diagnosis present

## 2012-11-29 DIAGNOSIS — M889 Osteitis deformans of unspecified bone: Secondary | ICD-10-CM

## 2012-11-29 DIAGNOSIS — I369 Nonrheumatic tricuspid valve disorder, unspecified: Secondary | ICD-10-CM

## 2012-11-29 DIAGNOSIS — I428 Other cardiomyopathies: Secondary | ICD-10-CM | POA: Diagnosis present

## 2012-11-29 DIAGNOSIS — F32A Depression, unspecified: Secondary | ICD-10-CM | POA: Diagnosis present

## 2012-11-29 HISTORY — DX: Osteitis deformans of unspecified bone: M88.9

## 2012-11-29 LAB — BASIC METABOLIC PANEL
BUN: 23 mg/dL (ref 6–23)
Calcium: 8.8 mg/dL (ref 8.4–10.5)
GFR calc non Af Amer: 39 mL/min — ABNORMAL LOW (ref >90)
Sodium: 139 mEq/L (ref 135–145)

## 2012-11-29 LAB — CBC
MCH: 31.6 pg (ref 26.0–34.0)
MCHC: 32.8 g/dL (ref 30.0–36.0)
Platelets: 241 10*3/uL (ref 150–400)
WBC: 4.4 10*3/uL (ref 4.0–10.5)

## 2012-11-29 LAB — URINE CULTURE

## 2012-11-29 MED ORDER — ONDANSETRON HCL 4 MG PO TABS
4.0000 mg | ORAL_TABLET | ORAL | Status: DC | PRN
  Administered 2012-11-29 – 2012-11-30 (×2): 4 mg via ORAL
  Filled 2012-11-29 (×2): qty 1

## 2012-11-29 MED ORDER — PANTOPRAZOLE SODIUM 40 MG PO TBEC
40.0000 mg | DELAYED_RELEASE_TABLET | Freq: Two times a day (BID) | ORAL | Status: DC
  Administered 2012-11-29 – 2012-11-30 (×3): 40 mg via ORAL
  Filled 2012-11-29 (×3): qty 1

## 2012-11-29 MED ORDER — ONDANSETRON HCL 4 MG/2ML IJ SOLN: 4.0000 mg | INTRAMUSCULAR | Status: DC | PRN

## 2012-11-29 MED ORDER — SODIUM CHLORIDE 0.9 % IV SOLN: INTRAVENOUS | Status: DC

## 2012-11-29 NOTE — Progress Notes (Signed)
[facility] PROGRESS NOTE  Jennifer [name] ONG:[id number] DOB: [birth year] DOA: 11/27/2012 PCP: Pcp Not In System  Chart reviewed. Discussed with Dr. Hinton Lovely  Assessment/Plan:    Chest pain: Likely noncardiac. Echocardiogram pending. Troponins negative. Will discontinue telemetry.   Abdominal pain, epigastric: for EGD tomorrow   Nausea alone: Will change Zofran to every 4 as needed   HYPERTENSION, BENIGN SYSTEMIC   Nonischemic cardiomyopathy, compensated    Anemia   OBESITY, NOS   CKD (chronic kidney disease) stage 3, GFR 30-59 ml/min   Paget's disease of bone: Discussed bony changes on radiology findings. Largely unchanged from bone scan in 2011 which was ordered for "evaluate for Paget's disease". No further workup needed.    GERD (gastroesophageal reflux disease)   Depression   appreciate consultants.  HPI/Subjective: Still with nausea. Has some transient left arm pain, transient pain of her left side of her pannus. Both resolved.   Objective: Filed Vitals:   11/29/12 0421  BP: 129/64  Pulse: 75  Temp: 98.4 F (36.9 C)  Resp: 18   No intake or output data in the 24 hours ending 11/29/12 1059 Filed Weights   11/27/12 2240  Weight: 82.509 kg (181 lb 14.4 oz)    Exam:   General:  currently breathing. It appears comfortable. Will return to examine formally.   Data Reviewed: Basic Metabolic Panel:  Recent Labs Lab 11/27/12 1935 11/29/12 0535  NA 142 139  K 4.3 4.1  CL 108 105  CO2 25 25  GLUCOSE 86 87  BUN 28* 23  CREATININE 1.33* 1.31*  CALCIUM 8.9 8.8   Liver Function Tests:  Recent Labs Lab 11/27/12 1935  AST 25  ALT 13  ALKPHOS 95  BILITOT 0.2*  PROT 7.1  ALBUMIN 3.1*    Recent Labs Lab 11/28/12 1038  LIPASE 15   No results found for this basename: AMMONIA,  in the last 168 hours CBC:  Recent Labs Lab 11/27/12 1935 11/29/12 0535  WBC 5.0 4.4  HGB 9.9* 9.9*  HCT 29.6* 30.2*  MCV 95.8 96.5  PLT 222 241   Cardiac  Enzymes:  Recent Labs Lab 11/28/12 11/28/12 0501 11/28/12 1038  TROPONINI <0.30 <0.30 <0.30   BNP (last 3 results)  Recent Labs  11/27/12 1935  PROBNP 193.6*   CBG: No results found for this basename: GLUCAP,  in the last 168 hours  Recent Results (from the past 240 hour(s))  URINE CULTURE     Status: None   Collection Time    11/27/12  8:24 PM      Result Value Range Status   Specimen Description URINE, CLEAN CATCH   Final   Special Requests NONE   Final   Culture  Setup Time     Final   Value: 11/28/2012 04:44     Performed at [facility] Count     Final   Value: 90,000 COLONIES/ML     Performed at [facility]   Culture     Final   Value: Multiple bacterial morphotypes present, none predominant. Suggest appropriate recollection if clinically indicated.     Performed at [facility]   Report Status 11/29/2012 FINAL   Final     Studies: Ct Abdomen Pelvis Wo Contrast  11/28/2012   CLINICAL DATA:  Nausea. Diarrhea.  Abdominal pain.  EXAM: CT ABDOMEN AND PELVIS WITHOUT CONTRAST  TECHNIQUE: Multidetector CT imaging of the abdomen and pelvis was performed following the standard protocol without intravenous contrast.  COMPARISON:  02/02/2010  FINDINGS: Dependent subsegmental atelectasis or scarring noted in both lower lobes.  Peripheral capsular calcifications are present in the liver both superiorly and posteriorly, with central hypodensity and peripheral calcification in a peripheral liver lesion along the posterior right hepatic lobe measuring 1.6 x 0.8 cm on image 27 of series 2. The peripheral calcific lesions are not present on the prior CT scan from 2008 and demonstrate only low T1 and T2 signal intensity on recent MRI making them highly inconspicuous.  Right adrenal mass, 2.3 x 1.3 cm, internal density -14 Hounsfield units, compatible with adenoma. Non contrast CT appearance of the spleen and pancreas unremarkable. Gallbladder surgically  absent.  Several punctate nonobstructive left renal calculi are present. Vascular calcifications are present in the anatomic pelvis but no ureteral calculus is observed on the left. No significant left renal contour abnormality.  No pathologic adenopathy noted. Densely calcified posterior uterine fundal fibroid. No discrete adnexal mass.  Multilevel degenerative endplate sclerosis noted. There is a large hemangioma in the T12 vertebral body.  Abnormal sclerosis and lucency noted in the sacrum, potentially from metastatic disease, Paget's disease, or fibrous dysplasia.  Severe degenerative arthropathy of both hips. Pubic body sclerosis with widening of the pubic symphysis, suspicious for erosive arthropathy and/or osteitis pubis.  IMPRESSION: 1. Several tiny nonobstructive left renal calculi. 2. Mixed lytic and sclerotic lesion in the sacrum, chronic, likely Paget's disease of bone given the chronicity. Osseous metastatic disease not excluded. 3. Abnormal calcified capsular lesions in the liver could be postinflammatory, less likely neoplastic but likely merits observation. Consider repeat CT in 6 months time. 4. Sigmoid diverticulosis without active diverticulitis. 5. Prior right nephrectomy. 6. Right adrenal adenoma. 7. Sclerosis and widening of the pubic symphysis, suspicious for erosions and possible osteitis pubis. 8. Degenerative arthropathy of both hips, severe.   Electronically Signed   By: Herbie Baltimore M.D.   On: 11/28/2012 14:26   Dg Chest 2 View  11/27/2012   CLINICAL DATA:  Chest pain and cough.  EXAM: CHEST  2 VIEW  COMPARISON:  05/31/2011  FINDINGS: There is mild hyperinflation which is similar to the previous examination. No focal airspace disease or pulmonary edema. Prominent density in the left suprahilar region is probably associated with the left anterior 1st rib. Heart and mediastinum are within normal limits. Bony thorax is intact. Degenerative changes in the thoracic spine.  IMPRESSION:  No acute chest findings.   Electronically Signed   By: Richarda Overlie M.D.   On: 11/27/2012 18:19    Scheduled Meds: . ARIPiprazole  5 mg Oral q morning - 10a  . buPROPion  150 mg Oral q morning - 10a  . carvedilol  12.5 mg Oral BID WC  . fentaNYL  75 mcg Transdermal Q72H  . ferrous sulfate  325 mg Oral q morning - 10a  . furosemide  20 mg Oral q morning - 10a  . latanoprost  1 drop Both Eyes QHS  . LORazepam  0.5 mg Oral TID  . multivitamin with minerals  1 tablet Oral q morning - 10a  . pantoprazole (PROTONIX) IV  40 mg Intravenous Q12H  . risperiDONE  1 mg Oral QHS,MR X 1  . sodium chloride  3 mL Intravenous Q12H  . sodium chloride  3 mL Intravenous Q12H   Continuous Infusions:   Time spent: 35  Keimani Laufer L  [facility] Pager [phone]. If 7PM-7AM, please contact night-coverage at [email or web address], password [facility] 11/29/2012, 10:59 AM  LOS: 2 days

## 2012-11-29 NOTE — Progress Notes (Signed)
  Echocardiogram 2D Echocardiogram has been performed.  Orabelle Rylee, [facility] 11/29/2012, 9:54 AM

## 2012-11-29 NOTE — Progress Notes (Signed)
The patient has no further chest pain today. She does complain of ongoing nausea however. The CT scan showed a abnormal calcified small lesion in the liver of uncertain etiology it was recommended to do a followup CT scan in about 6 months. In regards to the ongoing nausea with previous associated chest pain we will plan to do an EGD tomorrow.

## 2012-11-29 NOTE — Progress Notes (Signed)
Subjective: Complains of nausea, no chest pain, some abd pain  Objective: Vital signs in last 24 hours: Temp:  [97.7 F (36.5 C)-98.4 F (36.9 C)] 98.4 F (36.9 C) (10/16 0421) Pulse Rate:  [69-75] 75 (10/16 0421) Resp:  [18] 18 (10/16 0421) BP: (111-131)/(58-69) 129/64 mmHg (10/16 0421) SpO2:  [96 %-98 %] 97 % (10/16 0421) Weight change:  Last BM Date: 11/28/12 Intake/Output from previous day: -250 10/15 0701 - 10/16 0700 In: -  Out: 250 [Urine:250] Intake/Output this shift:    PE: General:Pleasant affect, NAD Skin:Warm and dry, brisk capillary refill HEENT:normocephalic, sclera clear, mucus membranes moist Neck:supple, no JVD, no bruits  Heart:S1S2 RRR without murmur, gallup, rub or click Lungs:clear without rales, rhonchi, or wheezes NWG:NFAO, + epigastric tenderness, + BS, do not palpate liver spleen or masses Ext:1+ lower ext edema, 2+ pedal pulses, 2+ radial pulses Neuro:alert and oriented, MAE, follows commands, + facial symmetry   Lab Results:  Recent Labs  11/27/12 1935 11/29/12 0535  WBC 5.0 4.4  HGB 9.9* 9.9*  HCT 29.6* 30.2*  PLT 222 241   BMET  Recent Labs  11/27/12 1935 11/29/12 0535  NA 142 139  K 4.3 4.1  CL 108 105  CO2 25 25  GLUCOSE 86 87  BUN 28* 23  CREATININE 1.33* 1.31*  CALCIUM 8.9 8.8    Recent Labs  11/28/12 0501 11/28/12 1038  TROPONINI <0.30 <0.30    No results found for this basename: CHOL, HDL, LDLCALC, LDLDIRECT, TRIG, CHOLHDL   No results found for this basename: HGBA1C     No results found for this basename: TSH    Hepatic Function Panel  Recent Labs  11/27/12 1935  PROT 7.1  ALBUMIN 3.1*  AST 25  ALT 13  ALKPHOS 95  BILITOT 0.2*   No results found for this basename: CHOL,  in the last 72 hours No results found for this basename: PROTIME,  in the last 72 hours      Studies/Results: Ct Abdomen Pelvis Wo Contrast  11/28/2012   CLINICAL DATA:  Nausea. Diarrhea.  Abdominal pain.   EXAM: CT ABDOMEN AND PELVIS WITHOUT CONTRAST  TECHNIQUE: Multidetector CT imaging of the abdomen and pelvis was performed following the standard protocol without intravenous contrast.  COMPARISON:  02/02/2010  FINDINGS: Dependent subsegmental atelectasis or scarring noted in both lower lobes.  Peripheral capsular calcifications are present in the liver both superiorly and posteriorly, with central hypodensity and peripheral calcification in a peripheral liver lesion along the posterior right hepatic lobe measuring 1.6 x 0.8 cm on image 27 of series 2. The peripheral calcific lesions are not present on the prior CT scan from 2008 and demonstrate only low T1 and T2 signal intensity on recent MRI making them highly inconspicuous.  Right adrenal mass, 2.3 x 1.3 cm, internal density -14 Hounsfield units, compatible with adenoma. Non contrast CT appearance of the spleen and pancreas unremarkable. Gallbladder surgically absent.  Several punctate nonobstructive left renal calculi are present. Vascular calcifications are present in the anatomic pelvis but no ureteral calculus is observed on the left. No significant left renal contour abnormality.  No pathologic adenopathy noted. Densely calcified posterior uterine fundal fibroid. No discrete adnexal mass.  Multilevel degenerative endplate sclerosis noted. There is a large hemangioma in the T12 vertebral body.  Abnormal sclerosis and lucency noted in the sacrum, potentially from metastatic disease, Paget's disease, or fibrous dysplasia.  Severe degenerative arthropathy of both hips. Pubic body sclerosis  with widening of the pubic symphysis, suspicious for erosive arthropathy and/or osteitis pubis.  IMPRESSION: 1. Several tiny nonobstructive left renal calculi. 2. Mixed lytic and sclerotic lesion in the sacrum, chronic, likely Paget's disease of bone given the chronicity. Osseous metastatic disease not excluded. 3. Abnormal calcified capsular lesions in the liver could be  postinflammatory, less likely neoplastic but likely merits observation. Consider repeat CT in 6 months time. 4. Sigmoid diverticulosis without active diverticulitis. 5. Prior right nephrectomy. 6. Right adrenal adenoma. 7. Sclerosis and widening of the pubic symphysis, suspicious for erosions and possible osteitis pubis. 8. Degenerative arthropathy of both hips, severe.   Electronically Signed   By: Herbie Baltimore M.D.   On: 11/28/2012 14:26   Dg Chest 2 View  11/27/2012   CLINICAL DATA:  Chest pain and cough.  EXAM: CHEST  2 VIEW  COMPARISON:  05/31/2011  FINDINGS: There is mild hyperinflation which is similar to the previous examination. No focal airspace disease or pulmonary edema. Prominent density in the left suprahilar region is probably associated with the left anterior 1st rib. Heart and mediastinum are within normal limits. Bony thorax is intact. Degenerative changes in the thoracic spine.  IMPRESSION: No acute chest findings.   Electronically Signed   By: Richarda Overlie M.D.   On: 11/27/2012 18:19    Medications: I have reviewed the patient's current medications. Scheduled Meds: . ARIPiprazole  5 mg Oral q morning - 10a  . buPROPion  150 mg Oral q morning - 10a  . carvedilol  12.5 mg Oral BID WC  . fentaNYL  75 mcg Transdermal Q72H  . ferrous sulfate  325 mg Oral q morning - 10a  . furosemide  20 mg Oral q morning - 10a  . latanoprost  1 drop Both Eyes QHS  . LORazepam  0.5 mg Oral TID  . multivitamin with minerals  1 tablet Oral q morning - 10a  . pantoprazole (PROTONIX) IV  40 mg Intravenous Q12H  . risperiDONE  1 mg Oral QHS,MR X 1  . sodium chloride  3 mL Intravenous Q12H  . sodium chloride  3 mL Intravenous Q12H   Continuous Infusions:  PRN Meds:.sodium chloride, acetaminophen, acetaminophen, HYDROcodone-acetaminophen, ondansetron (ZOFRAN) IV, ondansetron, sodium chloride, zolpidem  Assessment/Plan: Principal Problem:   Chest pain Active Problems:   Pernicious anemia    HYPERTENSION, BENIGN SYSTEMIC   RENAL INSUFFICIENCY, CHRONIC   Abdominal pain, epigastric  PLAN: Last saw Dr. Tresa Endo in Nov 2012.  She tells me she would like Dr. Tresa Endo to see her. Admitted for chest pain and lt arm tingling and neck pain.  She rec'd ASA and NTG with relief of pain.  Yesterday had significant abd pain. Seen by GI.  Agree with echo that was ordered.  Last nuc study 05/20/10 with sm. Fixed septal defect with hypokinesis likely secondary to LBBB. No ischemia.  EF 51% Last Echo 11/04/09 with EF 50-55%, Mild MR.    LOS: 2 days   Time spent with pt. : minutes. [facility]  Nurse Practitioner Certified Pager [phone] 11/29/2012, 8:51 AM  Echo Study Conclusions  - Left ventricle: The cavity size was normal. Wall thickness was increased in a pattern of mild LVH. Systolic function was mildly reduced. The estimated ejection fraction was in the range of 45% to 50%. Anteroseptal hypokinesis with incoordinate septal motion. Doppler parameters are consistent with abnormal left ventricular relaxation (grade 1 diastolic dysfunction). The E/e' ratio is <10, suggesting normal LV filling pressure. - Mitral valve: Calcified  annulus. Valve area by pressure half-time: 0.78cm^2. - Right atrium: The atrium was at the upper limits of normal in size. - Atrial septum: Aneurysmal - 1.28 cm excursion. Poorly visualized. A patent foramen ovale cannot be excluded. - Tricuspid valve: Moderate regurgitation. - Pulmonary arteries: PA peak pressure: 40mm Hg (S). - Inferior vena cava: The vessel was dilated; the respirophasic diameter changes were blunted (< 50%); findings are consistent with elevated central venous pressure  Patient seen and examined. Agree with assessment and plan. Pt is well known to me, but I have not seen her over the last 2 years since she has been in nursing home. I have included old office records in shadow chart which hopefully will be scanned into epic. She has a remote  history of an nonischemic cardiomyopathy and LV function has improved from a nadir of 30% remotely. Suspect wall motion septally is related to LBBB. Agree with GI assessment and plan for endoscopy. Doubt ischemic chest plan. Will follow   Lennette Bihari, MD, [facility] 11/29/2012 6:24 PM

## 2012-11-30 ENCOUNTER — Encounter (HOSPITAL_COMMUNITY)
Admission: EM | Disposition: A | Discharge status: Nursing Home (Includes ICF) | Payer: Self-pay | Source: Home / Self Care | Attending: Internal Medicine

## 2012-11-30 ENCOUNTER — Encounter (HOSPITAL_COMMUNITY): Payer: Self-pay | Admitting: *Deleted

## 2012-11-30 ENCOUNTER — Other Ambulatory Visit: Payer: Self-pay | Admitting: Cardiology

## 2012-11-30 DIAGNOSIS — R110 Nausea: Secondary | ICD-10-CM

## 2012-11-30 DIAGNOSIS — F329 Major depressive disorder, single episode, unspecified: Secondary | ICD-10-CM

## 2012-11-30 DIAGNOSIS — I428 Other cardiomyopathies: Secondary | ICD-10-CM

## 2012-11-30 DIAGNOSIS — R079 Chest pain, unspecified: Secondary | ICD-10-CM

## 2012-11-30 HISTORY — PX: ESOPHAGOGASTRODUODENOSCOPY: SHX5428

## 2012-11-30 SURGERY — EGD (ESOPHAGOGASTRODUODENOSCOPY)
Anesthesia: Moderate Sedation

## 2012-11-30 MED ORDER — FENTANYL CITRATE 0.05 MG/ML IJ SOLN
INTRAMUSCULAR | Status: DC | PRN
  Administered 2012-11-30: 25 ug via INTRAVENOUS
  Administered 2012-11-30: 15 ug via INTRAVENOUS

## 2012-11-30 MED ORDER — BUTAMBEN-TETRACAINE-BENZOCAINE 2-2-14 % EX AERO
INHALATION_SPRAY | CUTANEOUS | Status: DC | PRN
  Administered 2012-11-30: 2 via TOPICAL

## 2012-11-30 MED ORDER — ACETAMINOPHEN 325 MG PO TABS: 650.0000 mg | ORAL_TABLET | Freq: Four times a day (QID) | ORAL | Status: DC | PRN

## 2012-11-30 MED ORDER — HYDROCODONE-ACETAMINOPHEN 5-325 MG PO TABS: 1.0000 | ORAL_TABLET | ORAL | Status: DC | PRN

## 2012-11-30 MED ORDER — MIDAZOLAM HCL 5 MG/ML IJ SOLN
INTRAMUSCULAR | Status: AC
  Filled 2012-11-30: qty 2

## 2012-11-30 MED ORDER — FENTANYL CITRATE 0.05 MG/ML IJ SOLN
INTRAMUSCULAR | Status: AC
  Filled 2012-11-30: qty 2

## 2012-11-30 MED ORDER — MIDAZOLAM HCL 10 MG/2ML IJ SOLN
INTRAMUSCULAR | Status: DC | PRN
  Administered 2012-11-30 (×2): 2 mg via INTRAVENOUS

## 2012-11-30 MED ORDER — PROMETHAZINE HCL 12.5 MG PO TABS: 12.5000 mg | ORAL_TABLET | Freq: Four times a day (QID) | ORAL | Status: DC | PRN

## 2012-11-30 NOTE — Progress Notes (Signed)
Spoke with [name] from [employer]. We have been given the approval to send the patient. The fl-2, discharge summary and AVS were forwarded. [name]'s family will transport her to the facility.  [name], Marine scientist Clinical Social Work

## 2012-11-30 NOTE — Discharge Summary (Signed)
Physician Discharge Summary  Jennifer [name] ZOX:[id number] DOB: Feb 15, 1935 DOA: 11/27/2012  PCP: Pcp Not In System  Admit date: 11/27/2012 Discharge date: 11/30/2012  Time spent: Greater than 30 minutes  Recommendations for Outpatient Follow-up:  1. Outpatient stress test to be considered by Dr. Tresa Endo  Discharge Diagnoses:  Active Problems:   Chest pain   Abdominal pain, epigastric   Nausea alone   HYPERTENSION, BENIGN SYSTEMIC   Nonischemic cardiomyopathy   Anemia   OBESITY, NOS   CKD (chronic kidney disease) stage 3, GFR 30-59 ml/min   Paget's disease of bone   GERD (gastroesophageal reflux disease)   Depression   Discharge Condition:   Filed Weights   11/27/12 2240 11/30/12 0517  Weight: 82.509 kg (181 lb 14.4 oz) 83.371 kg (183 lb 12.8 oz)    History and hospital course:  [name] is a 77 year old white female from assisted living who presented with epigastric and chest pain. Initially she was admitted by Dr. Jacinto Halim her pain was not likely to be cardiac but rather GI in origin. Hospitalists were asked to take over her care. She ruled out for MI and had no acute changes on EKGs. She did endorse nausea. She has a history of acid reflux. GI was consulted. She was scheduled in the past to have upper and lower endoscopy for iron deficiency anemia, but she never made her appointments. She had an EGD here which showed small hiatal hernia otherwise unremarkable. She has no further abdominal pain or chest pain. She does have intermittent shoulder pain. She reports she still has some nausea, but she is eating more than half of her trays. She is been cleared by GI and cardiology for discharge. I've given her a prescription for Phenergan and hydrocodone as needed. She may continue her proton pump inhibitor. Cardiology recommends outpatient follow up with Dr. Tresa Endo to consider stress test.  Procedures:  EGD shows hiatal hernia.  Consultations:  Cardiology  [facility]  gastroenterology  Discharge Exam: Filed Vitals:   11/30/12 1024  BP: 124/65  Pulse:   Temp:   Resp:     General: Alert, oriented. Comfortable. Cardiovascular: Regular rate rhythm without murmurs gallops rubs Respiratory: Clear to auscultation bilaterally without wheezes rhonchi or rales Abdomen soft nontender nondistended Extremities no clubbing cyanosis or edema  Discharge Instructions  Discharge Orders   Future Appointments Provider Department Dept Phone   12/20/2012 2:00 PM Marlowe Aschoff, DPM [facility] [phone]   Future Orders Complete By Expires   Activity as tolerated - No restrictions  As directed    Diet - low sodium heart healthy  As directed        Medication List         acetaminophen 325 MG tablet  Commonly known as:  TYLENOL  Take 2 tablets (650 mg total) by mouth every 6 (six) hours as needed.     ARIPiprazole 5 MG tablet  Commonly known as:  ABILIFY  Take 5 mg by mouth every morning.     buPROPion 150 MG 24 hr tablet  Commonly known as:  WELLBUTRIN XL  Take 150 mg by mouth every morning.     carvedilol 12.5 MG tablet  Commonly known as:  COREG  Take 12.5 mg by mouth 2 (two) times daily with a meal.     diclofenac sodium 1 % Gel  Commonly known as:  VOLTAREN  Apply 2 g topically 2 (two) times daily.     fentaNYL 75 MCG/HR  Commonly known  as:  DURAGESIC - dosed mcg/hr  Place 1 patch onto the skin every 3 (three) days.     ferrous sulfate 325 (65 FE) MG tablet  Take 325 mg by mouth every morning.     furosemide 20 MG tablet  Commonly known as:  LASIX  Take 20 mg by mouth every morning.     HYDROcodone-acetaminophen 5-325 MG per tablet  Commonly known as:  NORCO/VICODIN  Take 1-2 tablets by mouth every 4 (four) hours as needed.     latanoprost 0.005 % ophthalmic solution  Commonly known as:  XALATAN  Place 1 drop into both eyes at bedtime.     lisinopril 10 MG tablet  Commonly known as:  PRINIVIL,ZESTRIL   Take 10 mg by mouth every morning.     loperamide 2 MG tablet  Commonly known as:  IMODIUM A-D  Take 2 mg by mouth every 2 (two) hours as needed for diarrhea or loose stools. (max 16mg Luvenia Heller)     LORazepam 0.5 MG tablet  Commonly known as:  ATIVAN  Take 0.5 mg by mouth 3 (three) times daily.     multivitamin with minerals Tabs tablet  Take 1 tablet by mouth every morning.     pantoprazole 40 MG tablet  Commonly known as:  PROTONIX  Take 40 mg by mouth daily.     promethazine 12.5 MG tablet  Commonly known as:  PHENERGAN  Take 1 tablet (12.5 mg total) by mouth every 6 (six) hours as needed for nausea.     risperiDONE 1 MG tablet  Commonly known as:  RISPERDAL  Take 1 mg by mouth at bedtime and may repeat dose one time if needed.     temazepam 15 MG capsule  Commonly known as:  RESTORIL  Take 15 mg by mouth at bedtime.     traMADol 50 MG tablet  Commonly known as:  ULTRAM  Take 100 mg by mouth at bedtime.       Allergies  Allergen Reactions  . Codeine Other (See Comments)    Makes her sick on her stomach.       Follow-up Information   Follow up with Lennette Bihari, MD. (our office will call you with an appointment)    Specialty:  Cardiology   Contact information:   [address] [place] [address] [phone]       Follow up with primary care physician In 1 week.       The results of significant diagnostics from this hospitalization (including imaging, microbiology, ancillary and laboratory) are listed below for reference.    Significant Diagnostic Studies: Ct Abdomen Pelvis Wo Contrast  11/28/2012   CLINICAL DATA:  Nausea. Diarrhea.  Abdominal pain.  EXAM: CT ABDOMEN AND PELVIS WITHOUT CONTRAST  TECHNIQUE: Multidetector CT imaging of the abdomen and pelvis was performed following the standard protocol without intravenous contrast.  COMPARISON:  02/02/2010  FINDINGS: Dependent subsegmental atelectasis or scarring noted in both lower  lobes.  Peripheral capsular calcifications are present in the liver both superiorly and posteriorly, with central hypodensity and peripheral calcification in a peripheral liver lesion along the posterior right hepatic lobe measuring 1.6 x 0.8 cm on image 27 of series 2. The peripheral calcific lesions are not present on the prior CT scan from 2008 and demonstrate only low T1 and T2 signal intensity on recent MRI making them highly inconspicuous.  Right adrenal mass, 2.3 x 1.3 cm, internal density -14 Hounsfield units, compatible with adenoma. Non contrast CT  appearance of the spleen and pancreas unremarkable. Gallbladder surgically absent.  Several punctate nonobstructive left renal calculi are present. Vascular calcifications are present in the anatomic pelvis but no ureteral calculus is observed on the left. No significant left renal contour abnormality.  No pathologic adenopathy noted. Densely calcified posterior uterine fundal fibroid. No discrete adnexal mass.  Multilevel degenerative endplate sclerosis noted. There is a large hemangioma in the T12 vertebral body.  Abnormal sclerosis and lucency noted in the sacrum, potentially from metastatic disease, Paget's disease, or fibrous dysplasia.  Severe degenerative arthropathy of both hips. Pubic body sclerosis with widening of the pubic symphysis, suspicious for erosive arthropathy and/or osteitis pubis.  IMPRESSION: 1. Several tiny nonobstructive left renal calculi. 2. Mixed lytic and sclerotic lesion in the sacrum, chronic, likely Paget's disease of bone given the chronicity. Osseous metastatic disease not excluded. 3. Abnormal calcified capsular lesions in the liver could be postinflammatory, less likely neoplastic but likely merits observation. Consider repeat CT in 6 months time. 4. Sigmoid diverticulosis without active diverticulitis. 5. Prior right nephrectomy. 6. Right adrenal adenoma. 7. Sclerosis and widening of the pubic symphysis, suspicious for  erosions and possible osteitis pubis. 8. Degenerative arthropathy of both hips, severe.   Electronically Signed   By: Herbie Baltimore M.D.   On: 11/28/2012 14:26   Dg Chest 2 View  11/27/2012   CLINICAL DATA:  Chest pain and cough.  EXAM: CHEST  2 VIEW  COMPARISON:  05/31/2011  FINDINGS: There is mild hyperinflation which is similar to the previous examination. No focal airspace disease or pulmonary edema. Prominent density in the left suprahilar region is probably associated with the left anterior 1st rib. Heart and mediastinum are within normal limits. Bony thorax is intact. Degenerative changes in the thoracic spine.  IMPRESSION: No acute chest findings.   Electronically Signed   By: Richarda Overlie M.D.   On: 11/27/2012 18:19    Microbiology: Recent Results (from the past 240 hour(s))  URINE CULTURE     Status: None   Collection Time    11/27/12  8:24 PM      Result Value Range Status   Specimen Description URINE, CLEAN CATCH   Final   Special Requests NONE   Final   Culture  Setup Time     Final   Value: 11/28/2012 04:44     Performed at [facility] Count     Final   Value: 90,000 COLONIES/ML     Performed at [facility]   Culture     Final   Value: Multiple bacterial morphotypes present, none predominant. Suggest appropriate recollection if clinically indicated.     Performed at [facility]   Report Status 11/29/2012 FINAL   Final     Labs: Basic Metabolic Panel:  Recent Labs Lab 11/27/12 1935 11/29/12 0535  NA 142 139  K 4.3 4.1  CL 108 105  CO2 25 25  GLUCOSE 86 87  BUN 28* 23  CREATININE 1.33* 1.31*  CALCIUM 8.9 8.8   Liver Function Tests:  Recent Labs Lab 11/27/12 1935  AST 25  ALT 13  ALKPHOS 95  BILITOT 0.2*  PROT 7.1  ALBUMIN 3.1*    Recent Labs Lab 11/28/12 1038  LIPASE 15   No results found for this basename: AMMONIA,  in the last 168 hours CBC:  Recent Labs Lab 11/27/12 1935 11/29/12 0535  WBC 5.0 4.4   HGB 9.9* 9.9*  HCT 29.6* 30.2*  MCV  95.8 96.5  PLT 222 241   Cardiac Enzymes:  Recent Labs Lab 11/28/12 11/28/12 0501 11/28/12 1038  TROPONINI <0.30 <0.30 <0.30   BNP: BNP (last 3 results)  Recent Labs  11/27/12 1935  PROBNP 193.6*   CBG: No results found for this basename: GLUCAP,  in the last 168 hours  EKG: NSR with LBBB   Signed:  Draycen Leichter L  [facility] 11/30/2012, 12:47 PM

## 2012-11-30 NOTE — Progress Notes (Signed)
D/C orders received, iv removed, pt remains in stable condition, reviewed patients d/c instructions and given to patient, pt is taken back to assisted facility by family.

## 2012-11-30 NOTE — Clinical Social Work Note (Signed)
CSW attempted on 3 different occasions to contact [facility], [facility] to inquire if pt was able to return.  CSW received automated system and voicemail.  CSW left messages.  CSW contacted Marcelle Smiling (# on facesheet) disconnected CSW contacted Trice (# on facesheet) and left message to inquire if family knew of another way to get in touch with ALF or if family could visit the ALF to inquire about pt return.  CSW awaiting a return call.  Vickii Penna, LCSWA [phone]  Clinical Social Work

## 2012-11-30 NOTE — Op Note (Signed)
[facility] [address] [place], [address]   ENDOSCOPY PROCEDURE REPORT  PATIENT: Jennifer [name], Jennifer [name]  MR#: [id number] BIRTHDATE: [birth year] , 77  yrs. old GENDER: Female ENDOSCOPIST: Wandalee Ferdinand, MD REFERRED BY: PROCEDURE DATE:  11/30/2012 PROCEDURE:   EGD ASA CLASS: 3 INDICATIONS: nausea, chest pain MEDICATIONS: fentanyl 40 mcg IV, Versed 4 mg IV TOPICAL ANESTHETIC: Cetacaine spray  DESCRIPTION OF PROCEDURE:   After the risks benefits and alternatives of the procedure were thoroughly explained, informed consent was obtained.  The PENTAX GASTOROSCOPE [id number]  endoscope was introduced through the mouth and advanced to the second portion of the duodenum      , limited by Without limitations.   The instrument was slowly withdrawn as the mucosa was fully examined.      FINDINGS:  Esophagus: Normal  stomach: Tiny hiatal hernia otherwise normal  Duodenum: Normal  COMPLICATIONS:none  ENDOSCOPIC IMPRESSION:tiny hiatal hernia otherwise normal upper endoscopy. There is nothing on this exam to explain chest pain or nausea   RECOMMENDATIONS:supportive care. We will sign off. Call us again if needed.   REPEAT EXAM:   _______________________________ Rhodia Albright, MD 11/30/2012 9:22 AM       PATIENT NAME:  [name], [name] MR#: [id number]

## 2012-11-30 NOTE — Progress Notes (Signed)
CSW (Clinical Social Worker) left message with facility to notify that pt is ready for dc. Will await return phone call and then help facilitate dc.  Wadell Craddock, LCSWA [phone]

## 2012-11-30 NOTE — Progress Notes (Addendum)
Subjective: No complaints. She had mild left shoulder pain this am. But denies any further chest pain.   Objective: Vital signs in last 24 hours: Temp:  [98.6 F (37 C)-98.9 F (37.2 C)] 98.6 F (37 C) (10/17 0920) Pulse Rate:  [61-75] 61 (10/17 0940) Resp:  [9-16] 11 (10/17 0940) BP: (91-140)/(40-71) 119/40 mmHg (10/17 0940) SpO2:  [93 %-100 %] 95 % (10/17 0940) Weight:  [183 lb 12.8 oz (83.371 kg)] 183 lb 12.8 oz (83.371 kg) (10/17 0517) Last BM Date: 11/30/12  Intake/Output from previous day: 10/16 0701 - 10/17 0700 In: 940 [P.O.:940] Out: 200 [Urine:200] Intake/Output this shift:    Medications Current Facility-Administered Medications  Medication Dose Route Frequency Provider Last Rate Last Dose  . 0.9 %  sodium chloride infusion  250 mL Intravenous PRN Belkys A Regalado, MD      . acetaminophen (TYLENOL) tablet 650 mg  650 mg Oral Q6H PRN Belkys A Regalado, MD       Or  . acetaminophen (TYLENOL) suppository 650 mg  650 mg Rectal Q6H PRN Belkys A Regalado, MD      . ARIPiprazole (ABILIFY) tablet 5 mg  5 mg Oral q morning - 10a Shanna Cisco, MD   5 mg at 11/29/12 1140  . buPROPion (WELLBUTRIN XL) 24 hr tablet 150 mg  150 mg Oral q morning - 10a Shanna Cisco, MD   150 mg at 11/29/12 1140  . carvedilol (COREG) tablet 12.5 mg  12.5 mg Oral BID WC Shanna Cisco, MD   12.5 mg at 11/29/12 1755  . fentaNYL (DURAGESIC - dosed mcg/hr) patch 75 mcg  75 mcg Transdermal Q72H Shanna Cisco, MD   75 mcg at 11/27/12 2325  . furosemide (LASIX) tablet 20 mg  20 mg Oral q morning - 10a Shanna Cisco, MD   20 mg at 11/29/12 1139  . HYDROcodone-acetaminophen (NORCO/VICODIN) 5-325 MG per tablet 1-2 tablet  1-2 tablet Oral Q4H PRN Alba Cory, MD   2 tablet at 11/29/12 1627  . latanoprost (XALATAN) 0.005 % ophthalmic solution 1 drop  1 drop Both Eyes QHS Shanna Cisco, MD   1 drop at 11/29/12 2218  . LORazepam (ATIVAN) tablet 0.5 mg  0.5 mg Oral TID Shanna Cisco, MD   0.5 mg at 11/29/12 2218  . ondansetron (ZOFRAN) tablet 4 mg  4 mg Oral Q4H PRN Christiane Ha, MD   4 mg at 11/29/12 1151   Or  . ondansetron (ZOFRAN) injection 4 mg  4 mg Intravenous Q4H PRN Christiane Ha, MD      . pantoprazole (PROTONIX) EC tablet 40 mg  40 mg Oral BID WC Christiane Ha, MD   40 mg at 11/29/12 1754  . risperiDONE (RISPERDAL) tablet 1 mg  1 mg Oral QHS,MR X 1 Shanna Cisco, MD   1 mg at 11/29/12 2217  . sodium chloride 0.9 % injection 3 mL  3 mL Intravenous Q12H Belkys A Regalado, MD   3 mL at 11/29/12 1758  . sodium chloride 0.9 % injection 3 mL  3 mL Intravenous Q12H Belkys A Regalado, MD   3 mL at 11/29/12 2218  . sodium chloride 0.9 % injection 3 mL  3 mL Intravenous PRN Belkys A Regalado, MD      . zolpidem (AMBIEN) tablet 5 mg  5 mg Oral QHS PRN Pamella Pert, MD   5 mg at 11/28/12 0117    PE:  General appearance: alert, cooperative and no distress Lungs: clear to auscultation bilaterally Heart: regular rate and rhythm and 1/6 SM  Extremities: no LEE Pulses: 2+ and symmetric Skin: warm and dry Neurologic: Grossly normal  Lab Results:   Recent Labs  11/27/12 1935 11/29/12 0535  WBC 5.0 4.4  HGB 9.9* 9.9*  HCT 29.6* 30.2*  PLT 222 241   BMET  Recent Labs  11/27/12 1935 11/29/12 0535  NA 142 139  K 4.3 4.1  CL 108 105  CO2 25 25  GLUCOSE 86 87  BUN 28* 23  CREATININE 1.33* 1.31*  CALCIUM 8.9 8.8   Echo: EF 45-50%, mild LVH, normal LV filling pressures. Mod TR. Atrial Septal Aneurysm. Anteroseptal HK with incoordinate septal wall motion (? Due to LBBB)  Assessment/Plan    Active Problems:   OBESITY, NOS   HYPERTENSION, BENIGN SYSTEMIC   CKD (chronic kidney disease) stage 3, GFR 30-59 ml/min   Chest pain   Abdominal pain, epigastric   Paget's disease of bone   Nausea alone   GERD (gastroesophageal reflux disease)   Nonischemic cardiomyopathy   Anemia   Depression  Plan: Pt ruled out for MI. EGD  showed small hiatal hernia, but otherwise normal. We will schedule an OP stress test and F/u with Dr. Tresa Endo.      LOS: 3 days    Brittainy M. Sharol Harness, PA-C 11/30/2012 10:09 AM  Pt seen & examined along with Ms. Sharol Harness, PA-C.  I agree with her findings & exam as well as recommendations.  Much like Drs. Lowella Curb, I am not convinced that her Sx were cardiac.  No further CP noted.  EGD with small hiatal hernia ? If GI cause.    She does have h/o NICM & has been lost to f/u with Dr. Tresa Endo.  Her EF is now only mild-moderately reduced with EF ~45-50%.  I think an OP Myoview ST would give valuable information re: ? Ischemic cardiac etiology of CP. There is Anteroseptal WMA on Echo. BP is stable on Carvedilol.   - not currently on ACE-I or ARB (but we do not know her current EF) Continue with Furosemide based upon Echo findings of elevated LVEDP.  Is otherwise stable for d/c from a cardiac standpoint.  We will arrange OP Myoview & f/u with Dr.Kelly.  Marykay Lex, MD

## 2012-12-03 ENCOUNTER — Encounter (HOSPITAL_COMMUNITY): Payer: Self-pay | Admitting: Gastroenterology

## 2012-12-10 ENCOUNTER — Telehealth (HOSPITAL_COMMUNITY): Payer: Self-pay | Admitting: *Deleted

## 2012-12-20 ENCOUNTER — Ambulatory Visit: Payer: Self-pay | Admitting: Podiatrist

## 2012-12-21 ENCOUNTER — Ambulatory Visit: Payer: Medicare Other | Admitting: Cardiovascular Disease

## 2012-12-24 ENCOUNTER — Ambulatory Visit: Payer: Medicare Other | Admitting: Cardiovascular Disease

## 2012-12-25 ENCOUNTER — Telehealth (HOSPITAL_COMMUNITY): Payer: Self-pay | Admitting: *Deleted

## 2012-12-27 ENCOUNTER — Telehealth (HOSPITAL_COMMUNITY): Payer: Self-pay | Admitting: *Deleted

## 2013-01-02 ENCOUNTER — Ambulatory Visit (INDEPENDENT_AMBULATORY_CARE_PROVIDER_SITE_OTHER): Payer: Medicare Other | Admitting: Podiatrist

## 2013-01-02 ENCOUNTER — Encounter: Payer: Self-pay | Admitting: Podiatrist

## 2013-01-02 VITALS — BP 160/88 | HR 70 | Resp 12 | Ht 60.0 in | Wt 180.0 lb

## 2013-01-02 DIAGNOSIS — M19079 Primary osteoarthritis, unspecified ankle and foot: Secondary | ICD-10-CM

## 2013-01-02 DIAGNOSIS — B351 Tinea unguium: Secondary | ICD-10-CM

## 2013-01-02 DIAGNOSIS — M79609 Pain in unspecified limb: Secondary | ICD-10-CM

## 2013-01-02 MED ORDER — DICLOFENAC SODIUM 1 % TD GEL: 2.0000 g | Freq: Four times a day (QID) | TRANSDERMAL | Status: DC

## 2013-01-02 NOTE — Patient Instructions (Signed)
Diabetes and Foot Care Diabetes may cause you to have problems because of poor blood supply (circulation) to your feet and legs. This may cause the skin on your feet to become thinner, break easier, and heal more slowly. Your skin may become dry, and the skin may peel and crack. You may also have nerve damage in your legs and feet causing decreased feeling in them. You may not notice minor injuries to your feet that could lead to infections or more serious problems. Taking care of your feet is one of the most important things you can do for yourself.  HOME CARE INSTRUCTIONS  Wear shoes at all times, even in the house. Do not go barefoot. Bare feet are easily injured.  Check your feet daily for blisters, cuts, and redness. If you cannot see the bottom of your feet, use a mirror or ask someone for help.  Wash your feet with warm water (do not use hot water) and mild soap. Then pat your feet and the areas between your toes until they are completely dry. Do not soak your feet as this can dry your skin.  Apply a moisturizing lotion or petroleum jelly (that does not contain alcohol and is unscented) to the skin on your feet and to dry, brittle toenails. Do not apply lotion between your toes.  Trim your toenails straight across. Do not dig under them or around the cuticle. File the edges of your nails with an emery board or nail file.  Do not cut corns or calluses or try to remove them with medicine.  Wear clean socks or stockings every day. Make sure they are not too tight. Do not wear knee-high stockings since they may decrease blood flow to your legs.  Wear shoes that fit properly and have enough cushioning. To break in new shoes, wear them for just a few hours a day. This prevents you from injuring your feet. Always look in your shoes before you put them on to be sure there are no objects inside.  Do not cross your legs. This may decrease the blood flow to your feet.  If you find a minor scrape,  cut, or break in the skin on your feet, keep it and the skin around it clean and dry. These areas may be cleansed with mild soap and water. Do not cleanse the area with peroxide, alcohol, or iodine.  When you remove an adhesive bandage, be sure not to damage the skin around it.  If you have a wound, look at it several times a day to make sure it is healing.  Do not use heating pads or hot water bottles. They may burn your skin. If you have lost feeling in your feet or legs, you may not know it is happening until it is too late.  Make sure your health care provider performs a complete foot exam at least annually or more often if you have foot problems. Report any cuts, sores, or bruises to your health care provider immediately. SEEK MEDICAL CARE IF:   You have an injury that is not healing.  You have cuts or breaks in the skin.  You have an ingrown nail.  You notice redness on your legs or feet.  You feel burning or tingling in your legs or feet.  You have pain or cramps in your legs and feet.  Your legs or feet are numb.  Your feet always feel cold. SEEK IMMEDIATE MEDICAL CARE IF:   There is increasing redness,   swelling, or pain in or around a wound.  There is a red line that goes up your leg.  Pus is coming from a wound.  You develop a fever or as directed by your health care provider.  You notice a bad smell coming from an ulcer or wound. Document Released: 01/29/2000 Document Revised: 10/03/2012 Document Reviewed: 07/10/2012 [employer] Patient Information 2014 [employer].  

## 2013-01-03 ENCOUNTER — Telehealth: Payer: Self-pay | Admitting: *Deleted

## 2013-01-03 NOTE — Telephone Encounter (Signed)
Aleta asked which foot to apply the Voltaren gel.  Dr Irving Shows states B/L feet on toes.  Faxed to [phone].

## 2013-01-03 NOTE — Telephone Encounter (Signed)
Aleta from pt's care facility asked how often the Voltaren Gell is to be applied to pt's foot.  I found the rx to say 2 to 4 times/day, but Aleta states needs a specific number of times/day.  I instructed to perform 4 times/day and faxed the order to [phone].

## 2013-01-04 ENCOUNTER — Ambulatory Visit: Payer: Medicare Other | Admitting: Cardiovascular Disease

## 2013-01-04 NOTE — Progress Notes (Signed)
HPI:  Patient presents today for follow up of foot and nail care. Denies any new complaints today.  Objective:  Patients chart is reviewed.  Neurovascular status unchanged.  Patients nails are thickened, discolored, distrophic, friable and brittle with yellow-brown discoloration. Patient subjectively relates they are painful with shoes and with ambulation of bilateral feet.  Assessment:  Symptomatic onychomycosis  Plan:  Discussed treatment options and alternatives.  The symptomatic toenails were debrided through manual an mechanical means without complication.  Return appointment recommended at routine intervals of 3 months    Damya Comley, DPM   

## 2013-01-14 IMAGING — CR DG CHEST 2V
2 series · 2 of 2 positions shown · non-contrast
Comparison: 05/26/2011 and 04/18/2011

CLINICAL DATA: Hallucinations.  Help clearance.

CHEST - 2 VIEW

[w chest pa]
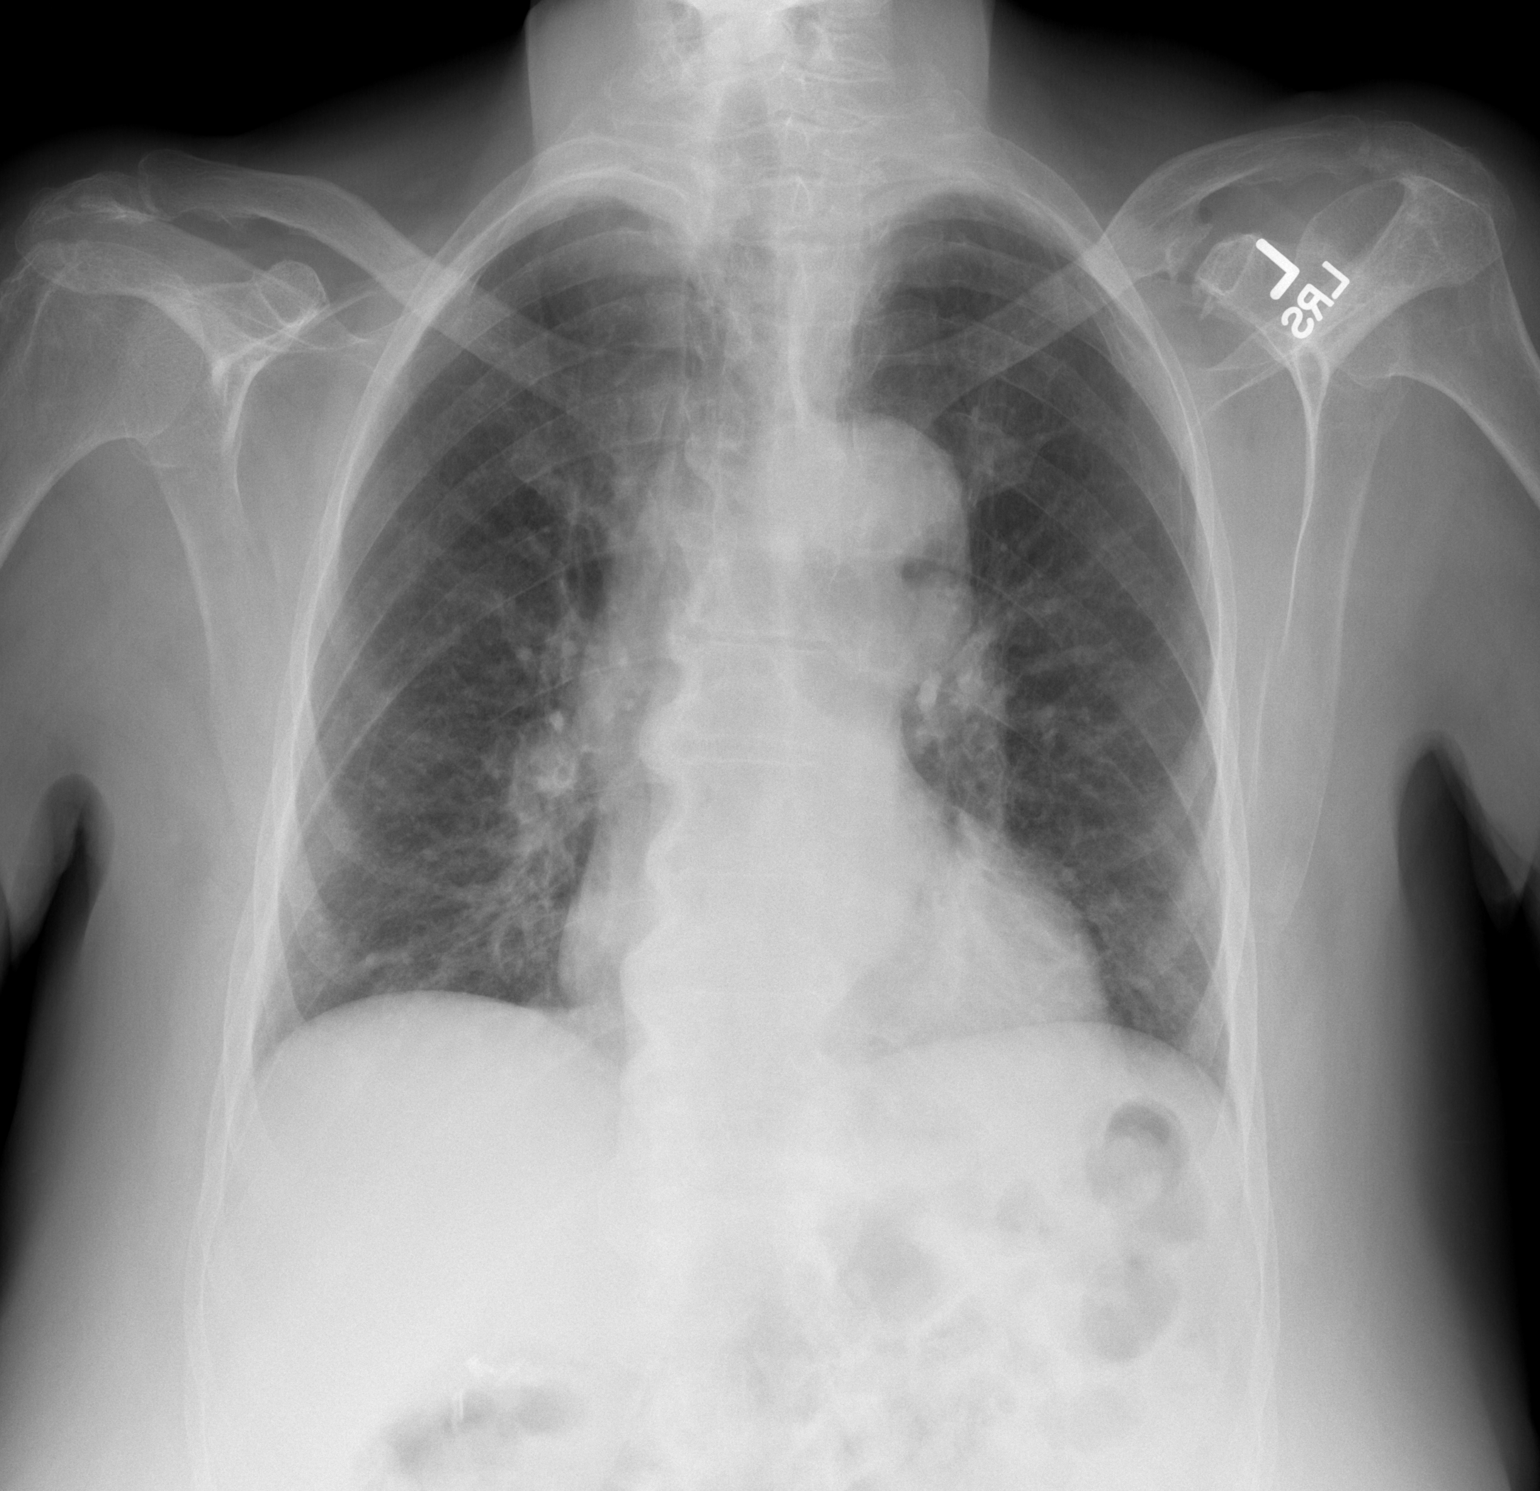

[w chest lat]
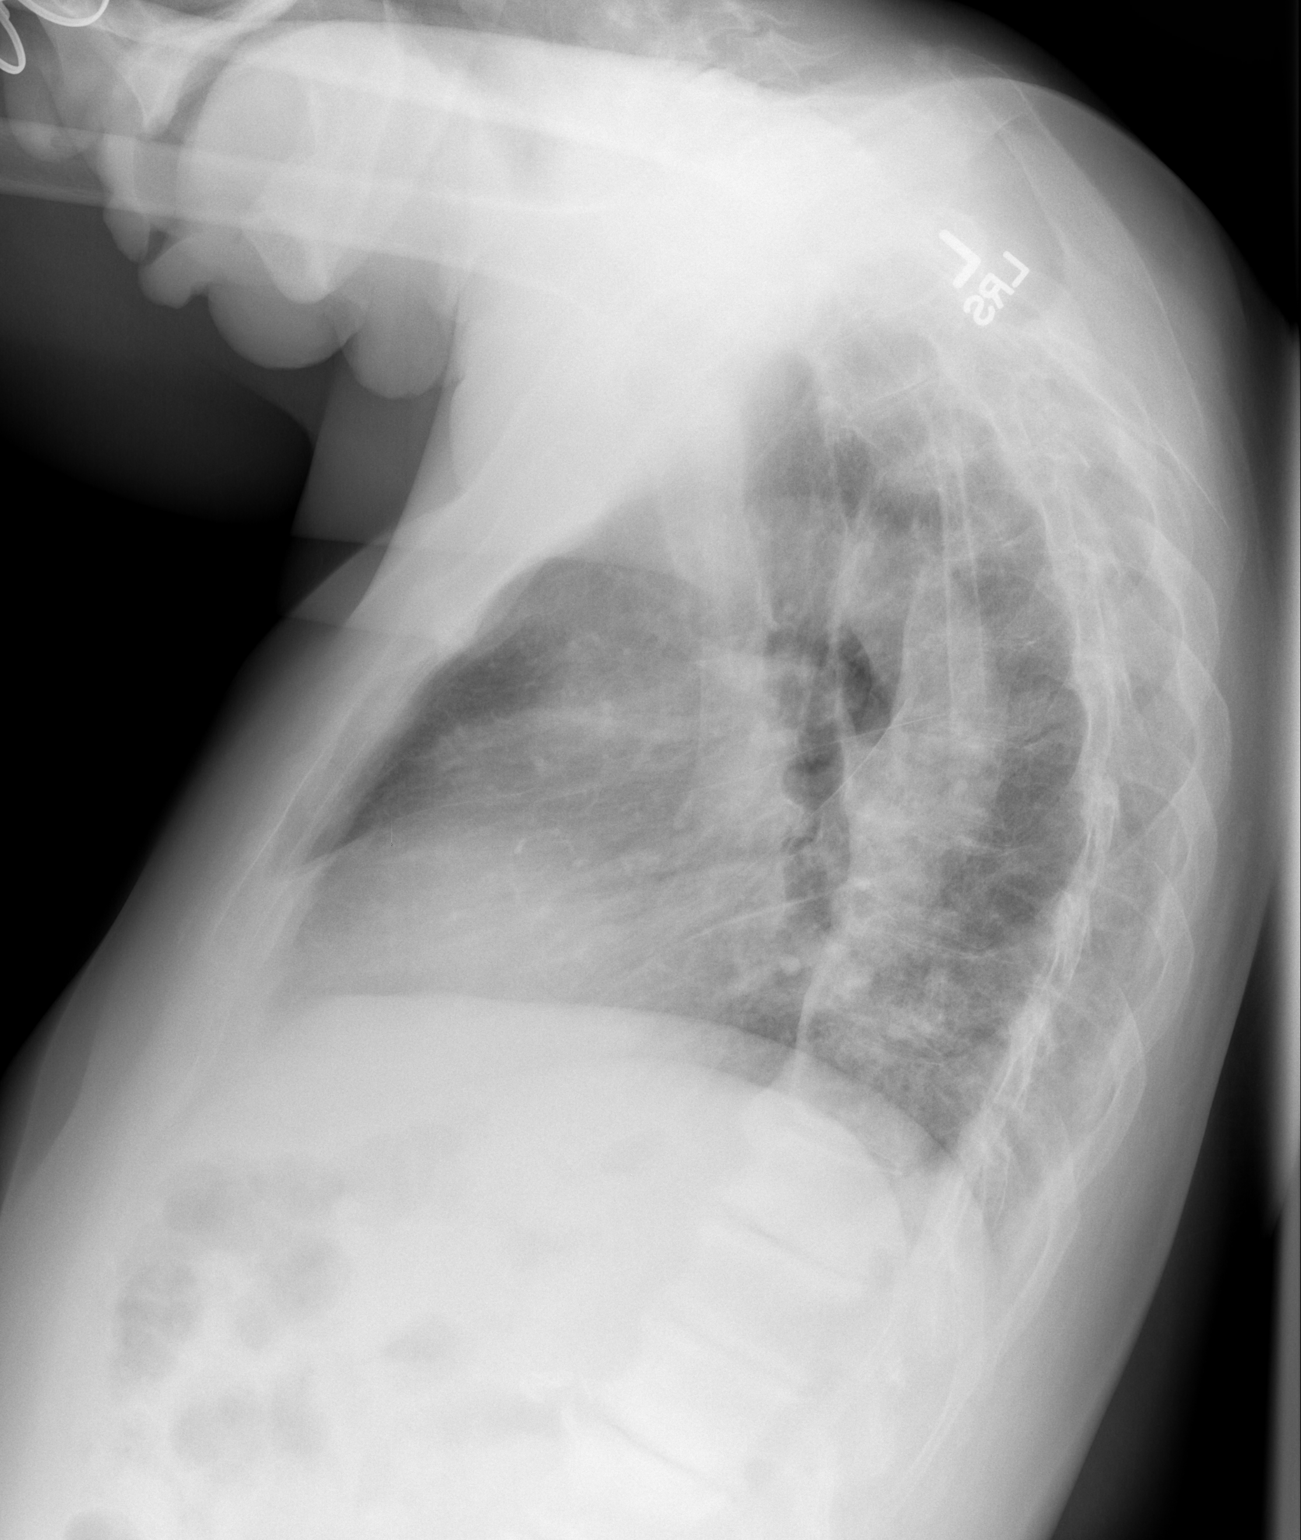

[2 of 2 positions shown; findings below may reference images not displayed]

FINDINGS: Stable cardiomegaly and tortuous thoracic aorta.  Chronic
mild prominence of the interstitial lung markings.  No pleural
effusion or pneumothorax.  No acute bony abnormality.
IMPRESSION: Stable examination.  Cardiomegaly with chronic peribronchial
thickening.

## 2013-03-19 ENCOUNTER — Encounter: Payer: Self-pay | Admitting: Cardiovascular Disease

## 2013-03-19 ENCOUNTER — Ambulatory Visit (INDEPENDENT_AMBULATORY_CARE_PROVIDER_SITE_OTHER): Payer: Medicare Other | Admitting: Cardiovascular Disease

## 2013-03-19 VITALS — BP 160/70 | HR 71 | Ht 62.0 in | Wt 178.2 lb

## 2013-03-19 DIAGNOSIS — I428 Other cardiomyopathies: Secondary | ICD-10-CM

## 2013-03-19 DIAGNOSIS — I10 Essential (primary) hypertension: Secondary | ICD-10-CM

## 2013-03-19 DIAGNOSIS — E785 Hyperlipidemia, unspecified: Secondary | ICD-10-CM

## 2013-03-19 DIAGNOSIS — R1013 Epigastric pain: Secondary | ICD-10-CM

## 2013-03-19 NOTE — Progress Notes (Signed)
Patient ID: Jennifer [name], female   DOB: 1935/12/05, 78 y.o.   MRN: [id number]     HPI: Jennifer [name] is a 78 y.o. female who presents for cardiology evaluation. I last saw her in the office in November 2012.  Jennifer [name] is a 78 year old African American female who has a history of a nonischemic cardiac myopathy with an initial ejection fraction noted at cardiac catheterization in 2006 at 30%. At that time showed normal coronary arteries. Some slowly, her ejection fraction had normalized to 50-55% an echo Doppler study in September 2011. She has a history of chronic left bundle branch block, hypertension, and osteoporosis. A nuclear perfusion study in April 2012 showed normal perfusion with mild septal thinning noted secondary to left bundle branch block without ischemia.  Apparently she had been hospitalized on 11/27/2012 and discharged on 11/30/2012. She did have chest pain which was felt to be atypical. She was seen by Dr. Gwendel Hanson who felt her pain was noncardiac and most likely GI. She did undergo endoscopy which showed a small hiatal hernia. In the past she had been noted to have iron deficiency. She has noticed some intermittent shoulder pain. She denies any recurrent episodes of chest pressure. An echo Doppler study at that time on 11/29/2012 showed an ejection fraction in the 45-50% range. There was septal motion abnormality most likely due to her left bundle branch block. There was grade 1 diastolic dysfunction. Mild mitral annular calcification. There was moderate tricuspid regurgitation. There was mild pulmonary hypertension with estimated PA pressure 40 mm.  Jennifer [name] currently resides at a nursing home. She has recently not been taking her Lasix. She denies leg swelling. She denies any recurrent episodes of chest pressure. She presents for followup evaluation.  Past Medical History  Diagnosis Date  . Dementia   . Arthritis   . Blood transfusion   . Hypertension   . Renal disorder   .  Seizures   . Psychosis   . Cardiomyopathy, nonischemic     EF initially 30% but improved with medical therapy, EF now 50-55%.  Jennifer [name] LBBB (left bundle branch block)   . Paget's disease of bone 11/29/2012    Past Surgical History  Procedure Laterality Date  . Joint replacement    . Breast lumpectomy    . Cholecystectomy    . Esophagogastroduodenoscopy N/A 11/30/2012    Procedure: ESOPHAGOGASTRODUODENOSCOPY (EGD);  Surgeon: Wonda Horner, MD;  Location: [facility] ENDOSCOPY;  Service: Endoscopy;  Laterality: N/A;    Allergies  Allergen Reactions  . Codeine Other (See Comments)    Makes her sick on her stomach.    Current Outpatient Prescriptions  Medication Sig Dispense Refill  . ARIPiprazole (ABILIFY) 5 MG tablet Take 5 mg by mouth every morning.      Jennifer [name] buPROPion (WELLBUTRIN XL) 150 MG 24 hr tablet Take 150 mg by mouth every morning.      . carvedilol (COREG) 12.5 MG tablet Take 12.5 mg by mouth 2 (two) times daily with a meal.      . diclofenac sodium (VOLTAREN) 1 % GEL Apply 2 g topically 2 (two) times daily.      . diclofenac sodium (VOLTAREN) 1 % GEL Apply 2 g topically 4 (four) times daily. Rub into affected area of foot 2 to 4 times daily  100 g  2  . fentaNYL (DURAGESIC - DOSED MCG/HR) 75 MCG/HR Place 1 patch onto the skin every 3 (three) days.      . ferrous sulfate 325 (65  FE) MG tablet Take 325 mg by mouth every morning.      . furosemide (LASIX) 20 MG tablet Take 20 mg by mouth as needed for edema.       Jennifer [name] latanoprost (XALATAN) 0.005 % ophthalmic solution Place 1 drop into both eyes at bedtime.      Jennifer [name] lisinopril (PRINIVIL,ZESTRIL) 10 MG tablet Take 10 mg by mouth every morning.      . loperamide (IMODIUM A-D) 2 MG tablet Take 2 mg by mouth every 2 (two) hours as needed for diarrhea or loose stools. (max 16mg Bailey Mech)      . LORazepam (ATIVAN) 0.5 MG tablet Take 0.5 mg by mouth 3 (three) times daily.      . Multiple Vitamin (MULTIVITAMIN WITH MINERALS) TABS tablet Take 1 tablet by  mouth every morning.      . pantoprazole (PROTONIX) 40 MG tablet Take 40 mg by mouth daily.      . risperiDONE (RISPERDAL) 1 MG tablet Take 1 mg by mouth at bedtime and may repeat dose one time if needed.      . temazepam (RESTORIL) 15 MG capsule Take 15 mg by mouth at bedtime.      . traMADol (ULTRAM) 50 MG tablet Take 100 mg by mouth at bedtime.      Jennifer [name] acetaminophen (TYLENOL) 325 MG tablet Take 2 tablets (650 mg total) by mouth every 6 (six) hours as needed.       No current facility-administered medications for this visit.    History   Social History  . Marital Status: Divorced    Spouse Name: N/A    Number of Children: N/A  . Years of Education: N/A   Occupational History  . Not on file.   Social History Main Topics  . Smoking status: Never Smoker   . Smokeless tobacco: Current User    Types: Snuff  . Alcohol Use: No  . Drug Use: No  . Sexual Activity: Not on file   Other Topics Concern  . Not on file   Social History Narrative  . No narrative on file    History reviewed. No pertinent family history.  ROS is negative for fevers, chills or night sweats. She denies changes in vision or hearing. She does were glasses. She denies wheezing. There's no PND or orthopnea. She does note some mild left arm discomfort. She denies PND or orthopnea. She denies exertionally precipitated chest tightness. At times she does note some mild nausea. She states she was told by Dr. Amedeo Plenty that she had a "spot in her liver. " She denies bleeding. She denies blood in stool or urine. There is no claudication. There is no edema to her lower extremity. She denies tremors. She denies rash. She does have history of restless legs. There is no diabetes. He does have occasional musculoskeletal complaints. She denies cold or heat intolerance. Other comprehensive 14 point system review is negative.  PE BP 160/70  Pulse 71  Ht 5\' 2"  (1.575 m)  Wt 178 lb 3.2 oz (80.831 kg)  BMI 32.58 kg/m2  General:  Alert, oriented, no distress.  Skin: normal turgor, no rashes HEENT: Normocephalic, atraumatic. Pupils round and reactive; sclera anicteric;no lid lag. Extraocular muscles intact. Nose without nasal septal hypertrophy Mouth/Parynx benign; Mallinpatti scale 2 Neck: No JVD, no carotid bruits; normal carotid upstroke Lungs: clear to ausculatation and percussion; no wheezing or rales Chest wall: no tenderness to palpitation Heart: RRR, s1 s2 normal 1/6 systolic murmur. Abdomen: Mild central adiposity  soft, nontender; no hepatosplenomehaly, BS+; abdominal aorta nontender and not dilated by palpation. Back: no CVA tenderness Pulses 2+ Extremities: no clubbing cyanosis or edema, Homan's sign negative  Neurologic: grossly nonfocal; cranial nerves grossly normal. Psychologic: normal affect and mood.  ECG (independently read by me): Sinus rhythm with left bundle-branch block and associated repolarization changes.  LABS:  BMET    Component Value Date/Time   NA 139 11/29/2012 0535   K 4.1 11/29/2012 0535   CL 105 11/29/2012 0535   CO2 25 11/29/2012 0535   GLUCOSE 87 11/29/2012 0535   BUN 23 11/29/2012 0535   CREATININE 1.31* 11/29/2012 0535   CALCIUM 8.8 11/29/2012 0535   GFRNONAA 39* 11/29/2012 0535   GFRAA 45* 11/29/2012 0535     Hepatic Function Panel     Component Value Date/Time   PROT 7.1 11/27/2012 1935   ALBUMIN 3.1* 11/27/2012 1935   AST 25 11/27/2012 1935   ALT 13 11/27/2012 1935   ALKPHOS 95 11/27/2012 1935   BILITOT 0.2* 11/27/2012 1935     CBC    Component Value Date/Time   WBC 4.4 11/29/2012 0535   RBC 3.13* 11/29/2012 0535   RBC 3.22* 11/28/2012 1038   HGB 9.9* 11/29/2012 0535   HCT 30.2* 11/29/2012 0535   PLT 241 11/29/2012 0535   MCV 96.5 11/29/2012 0535   MCH 31.6 11/29/2012 0535   MCHC 32.8 11/29/2012 0535   RDW 13.5 11/29/2012 0535   LYMPHSABS 2.0 05/28/2011 2115   MONOABS 0.4 05/28/2011 2115   EOSABS 0.1 05/28/2011 2115   BASOSABS 0.1 05/28/2011  2115     BNP    Component Value Date/Time   PROBNP 193.6* 11/27/2012 1935    Lipid Panel  No results found for this basename: chol, trig, hdl, cholhdl, vldl, ldlcalc     RADIOLOGY: No results found.    ASSESSMENT AND PLAN:  Ms. Shelanda Duvall is a 78 year old female who has a remote history of a documented nonischemic cardiac myopathy with initial ejection fraction approximately 30%. Her heart function has subsequently improved with medical therapy and now has been in the range of 45%. She recently was hospitalized with somewhat atypical chest pain. Previous cardiac catheterization showed normal coronary arteries. Her last nuclear perfusion study 2 years ago did not show ischemia. Presently, she is now having recurrent symptoms suggestive of angina. She's not having significant edema. I suggested she can change her Lasix from 20 mg daily 2 as needed for leg swelling. Her blood pressure today was improved when rechecked by me at 134/70 and she is tolerating lisinopril 10 mg as well as carvedilol 12.5 twice a day for this. Her heart rhythm is stable although she does have chronic left bundle branch block. Presently, I do not believe we need to pursue a cardiac nuclear perfusion scan. As long as she remains stable, I will see her in 6 month followup evaluation.    Troy Sine, MD, [facility]  03/19/2013 1:39 PM

## 2013-03-19 NOTE — Patient Instructions (Signed)
Your physician has recommended you make the following change in your medication: take the furosemide only as needed for swelling.  Your physician recommends that you schedule a follow-up appointment in: 6 months.

## 2013-04-03 ENCOUNTER — Ambulatory Visit: Payer: Medicare Other | Admitting: Podiatrist

## 2013-04-18 ENCOUNTER — Ambulatory Visit: Payer: Medicare Other | Admitting: Podiatrist

## 2013-05-09 ENCOUNTER — Ambulatory Visit: Payer: Medicare Other | Admitting: Podiatrist

## 2013-05-15 ENCOUNTER — Other Ambulatory Visit: Payer: Self-pay | Admitting: Gastroenterology

## 2013-05-15 DIAGNOSIS — K769 Liver disease, unspecified: Secondary | ICD-10-CM

## 2013-05-20 ENCOUNTER — Other Ambulatory Visit: Payer: Medicare Other

## 2013-05-21 ENCOUNTER — Ambulatory Visit
Admission: RE | Admit: 2013-05-21 | Discharge: 2013-05-21 | Disposition: A | Discharge status: Home / Self Care | Payer: Medicare Other | Source: Ambulatory Visit | Attending: Gastroenterology | Admitting: Gastroenterology

## 2013-05-21 DIAGNOSIS — K769 Liver disease, unspecified: Secondary | ICD-10-CM

## 2013-05-21 MED ORDER — IOHEXOL 350 MG/ML SOLN
100.0000 mL | Freq: Once | INTRAVENOUS | Status: AC | PRN
  Administered 2013-05-21: 100 mL via INTRAVENOUS

## 2013-05-23 ENCOUNTER — Ambulatory Visit (INDEPENDENT_AMBULATORY_CARE_PROVIDER_SITE_OTHER): Payer: Medicare Other | Admitting: Podiatrist

## 2013-05-23 ENCOUNTER — Encounter: Payer: Self-pay | Admitting: Podiatrist

## 2013-05-23 DIAGNOSIS — M2040 Other hammer toe(s) (acquired), unspecified foot: Secondary | ICD-10-CM

## 2013-05-23 DIAGNOSIS — M2141 Flat foot [pes planus] (acquired), right foot: Secondary | ICD-10-CM

## 2013-05-23 DIAGNOSIS — M779 Enthesopathy, unspecified: Secondary | ICD-10-CM

## 2013-05-23 DIAGNOSIS — B351 Tinea unguium: Secondary | ICD-10-CM

## 2013-05-23 DIAGNOSIS — M2140 Flat foot [pes planus] (acquired), unspecified foot: Secondary | ICD-10-CM

## 2013-05-23 DIAGNOSIS — M2142 Flat foot [pes planus] (acquired), left foot: Secondary | ICD-10-CM

## 2013-05-23 DIAGNOSIS — M79609 Pain in unspecified limb: Secondary | ICD-10-CM

## 2013-05-23 NOTE — Progress Notes (Signed)
HPI: Patient presents today for follow up of foot and nail care. Denies any new complaints today.  Objective: Patients chart is reviewed. Neurovascular status unchanged. Patients nails are thickened, discolored, distrophic, friable and brittle with yellow-brown discoloration. Patient subjectively relates they are painful with shoes and with ambulation of bilateral feet. Contracture of digits 3,4,5 bilateral are notable and uncomfortable.  Pinch callus left hallux present Assessment: Symptomatic onychomycosis . hammertoe Plan: Discussed treatment options and alternatives. The symptomatic toenails were debrided through manual an mechanical means without complication. Buttress pads made and dispensed.  May consider tenotomies if no improvement in the future. Return appointment recommended at routine intervals of 3 months

## 2013-06-05 ENCOUNTER — Encounter (HOSPITAL_COMMUNITY): Payer: Self-pay | Admitting: Pharmacy Technician

## 2013-06-05 ENCOUNTER — Telehealth: Payer: Self-pay | Admitting: *Deleted

## 2013-06-05 NOTE — Telephone Encounter (Signed)
Called and spoke with Jennifer [name] informing her that we received a surgical clearance request for the patient and per Dr. Claiborne Billings she will need to have a stress test prior to surgery. She will inform the patient and the family of this and will wait for the scheduler to call with appointment.

## 2013-06-06 ENCOUNTER — Ambulatory Visit (HOSPITAL_COMMUNITY)
Admission: RE | Admit: 2013-06-06 | Discharge: 2013-06-06 | Disposition: A | Discharge status: Home / Self Care | Payer: Medicare Other | Source: Ambulatory Visit | Attending: Cardiovascular Disease | Admitting: Cardiovascular Disease

## 2013-06-06 ENCOUNTER — Other Ambulatory Visit: Payer: Self-pay | Admitting: Orthopedic Surgery

## 2013-06-06 DIAGNOSIS — R079 Chest pain, unspecified: Secondary | ICD-10-CM | POA: Insufficient documentation

## 2013-06-06 MED ORDER — TECHNETIUM TC 99M SESTAMIBI GENERIC - CARDIOLITE
10.8000 | Freq: Once | INTRAVENOUS | Status: AC | PRN
  Administered 2013-06-06: 11 via INTRAVENOUS

## 2013-06-06 MED ORDER — REGADENOSON 0.4 MG/5ML IV SOLN
0.4000 mg | Freq: Once | INTRAVENOUS | Status: AC
Start: 2013-06-06 — End: 2013-06-06
  Administered 2013-06-06: 0.4 mg via INTRAVENOUS

## 2013-06-06 MED ORDER — TECHNETIUM TC 99M SESTAMIBI GENERIC - CARDIOLITE
30.4000 | Freq: Once | INTRAVENOUS | Status: AC | PRN
  Administered 2013-06-06: 30 via INTRAVENOUS

## 2013-06-06 NOTE — Procedures (Addendum)
[facility] [address] [place] [address] [phone]  Cardiology Nuclear Med Study  Jennifer [name] is a 78 y.o. female     MRN : [id number]     DOB: [birth year]  Procedure Date: 06/06/2013  Nuclear Med Background Indication for Stress Test:  Evaluation for Ischemia and Surgical Clearance History:  CAD;cardiomyopathy;seizures;psychosis;last NUC MPI on 05/20/2010-nonischemic;septal thinning;EF=51% Cardiac Risk Factors: Hypertension, LBBB and Overweight  Symptoms:  Chest Pain, Dizziness, Fatigue, Light-Headedness and Palpitations   Nuclear Pre-Procedure Caffeine/Decaff Intake:  9:00pm NPO After: 7:00am   IV Site: R Forearm  IV 0.9% NS with Angio Cath:  22g  Chest Size (in):  n/a IV Started by: Rolene Course, RN  Height: 5\' 2"  (1.575 m)  Cup Size: C  BMI:  Body mass index is 32.55 kg/(m^2). Weight:  178 lb (80.74 kg)   Tech Comments:  n/a    Nuclear Med Study 1 or 2 day study: 1 day  Stress Test Type:  [place] Provider:  Shelva Majestic, MD   Resting Radionuclide: Technetium 40m Sestamibi  Resting Radionuclide Dose: 10.8 mCi   Stress Radionuclide:  Technetium 66m Sestamibi  Stress Radionuclide Dose: 30.4 mCi           Stress Protocol Rest HR: 64 Stress HR: 88  Rest BP:153/83 Stress BP: 153/83  Exercise Time (min): n/a METS: n/a          Dose of Adenosine (mg):  n/a Dose of Lexiscan: 0.4 mg  Dose of Atropine (mg): n/a Dose of Dobutamine: n/a mcg/kg/min (at max HR)  Stress Test Technologist: Mellody Memos, CCT Nuclear Technologist: Imagene Riches, CNMT   Rest Procedure:  Myocardial perfusion imaging was performed at rest 45 minutes following the intravenous administration of Technetium 71m Sestamibi. Stress Procedure:  The patient received IV Lexiscan 0.4 mg over 15-seconds.  Technetium 1m Sestamibi injected IV at 30-seconds.  There were no significant changes with Lexiscan.  Quantitative  spect images were obtained after a 45 minute delay.  Transient Ischemic Dilatation (Normal <1.22):  0.96 Lung/Heart Ratio (Normal <0.45):  0.19 QGS EDV:  89 ml QGS ESV:  46 ml LV Ejection Fraction: 49%        Rest ECG: NSR-LBBB  Stress ECG: No significant change from baseline ECG  QPS Raw Data Images:  Normal; no motion artifact; normal heart/lung ratio. Stress Images:  Normal homogeneous uptake in all areas of the myocardium. Rest Images:  Normal homogeneous uptake in all areas of the myocardium. Subtraction (SDS):  No evidence of ischemia.  Impression Exercise Capacity:  Lexiscan with no exercise. BP Response:  Normal blood pressure response. Clinical Symptoms:  No significant symptoms noted. ECG Impression:  No significant ST segment change suggestive of ischemia. Comparison with Prior Nuclear Study: No significant change from previous study  Overall Impression:  Normal stress nuclear study.  LV Wall Motion:  NL LV Function; NL Wall Motion   Lorretta Harp, MD  06/06/2013 5:51 PM

## 2013-06-07 ENCOUNTER — Encounter: Payer: Self-pay | Admitting: *Deleted

## 2013-06-10 NOTE — Pre-Procedure Instructions (Signed)
Senna Lape  06/10/2013   Your procedure is scheduled on:  Wednesday, April 6th  Report to [place] at 1045 AM.  Call this number if you have problems the morning of surgery: [phone]   Remember:   Do not eat food or drink liquids after midnight.   Take these medicines the morning of surgery with A SIP OF WATER: abilify, wellbutrin, coreg, ativan if needed, protonix, vicodin if needed   Do not wear jewelry, make-up or nail polish.  Do not wear lotions, powders, or perfumes. You may wear deodorant.  Do not shave 48 hours prior to surgery. Men may shave face and neck.  Do not bring valuables to the hospital.  [facility] is not responsible  for any belongings or valuables.               Contacts, dentures or bridgework may not be worn into surgery.  Leave suitcase in the car. After surgery it may be brought to your room.  For patients admitted to the hospital, discharge time is determined by your  treatment team.               Patients discharged the day of surgery will not be allowed to drive home.  Please read over the following fact sheets that you were given: Pain Booklet, Coughing and Deep Breathing, Blood Transfusion Information, MRSA Information and Surgical Site Infection Prevention [place] - Preparing for Surgery  Before surgery, you can play an important role.  Because skin is not sterile, your skin needs to be as free of germs as possible.  You can reduce the number of germs on you skin by washing with CHG (chlorahexidine gluconate) soap before surgery.  CHG is an antiseptic cleaner which kills germs and bonds with the skin to continue killing germs even after washing.  Please DO NOT use if you have an allergy to CHG or antibacterial soaps.  If your skin becomes reddened/irritated stop using the CHG and inform your nurse when you arrive at Short Stay.  Do not shave (including legs and underarms) for at least 48 hours prior to the first CHG  shower.  You may shave your face.  Please follow these instructions carefully:   1.  Shower with CHG Soap the night before surgery and the morning of Surgery.  2.  If you choose to wash your hair, wash your hair first as usual with your normal shampoo.  3.  After you shampoo, rinse your hair and body thoroughly to remove the shampoo.  4.  Use CHG as you would any other liquid soap.  You can apply CHG directly to the skin and wash gently with scrungie or a clean washcloth.  5.  Apply the CHG Soap to your body ONLY FROM THE NECK DOWN.  Do not use on open wounds or open sores.  Avoid contact with your eyes, ears, mouth and genitals (private parts).  Wash genitals (private parts) with your normal soap.  6.  Wash thoroughly, paying special attention to the area where your surgery will be performed.  7.  Thoroughly rinse your body with warm water from the neck down.  8.  DO NOT shower/wash with your normal soap after using and rinsing off the CHG Soap.  9.  Pat yourself dry with a clean towel.            10.  Wear clean pajamas.            11.  Place clean sheets on your bed the night of your first shower and do not sleep with pets.  Day of Surgery  Do not apply any lotions/deoderants the morning of surgery.  Please wear clean clothes to the hospital/surgery center.

## 2013-06-11 ENCOUNTER — Encounter (HOSPITAL_COMMUNITY): Payer: Self-pay

## 2013-06-11 ENCOUNTER — Encounter (HOSPITAL_COMMUNITY)
Admission: RE | Admit: 2013-06-11 | Discharge: 2013-06-11 | Disposition: A | Discharge status: Home / Self Care | Payer: Medicare Other | Source: Ambulatory Visit | Attending: Orthopedic Surgery | Admitting: Orthopedic Surgery

## 2013-06-11 DIAGNOSIS — Z01812 Encounter for preprocedural laboratory examination: Secondary | ICD-10-CM | POA: Insufficient documentation

## 2013-06-11 DIAGNOSIS — I428 Other cardiomyopathies: Secondary | ICD-10-CM | POA: Insufficient documentation

## 2013-06-11 DIAGNOSIS — I10 Essential (primary) hypertension: Secondary | ICD-10-CM | POA: Insufficient documentation

## 2013-06-11 DIAGNOSIS — Z5189 Encounter for other specified aftercare: Secondary | ICD-10-CM | POA: Insufficient documentation

## 2013-06-11 HISTORY — DX: Major depressive disorder, single episode, unspecified: F32.9

## 2013-06-11 HISTORY — DX: Unspecified glaucoma: H40.9

## 2013-06-11 HISTORY — DX: Effusion, left ankle: M25.472

## 2013-06-11 HISTORY — DX: Anxiety disorder, unspecified: F41.9

## 2013-06-11 HISTORY — DX: Transient cerebral ischemic attack, unspecified: G45.9

## 2013-06-11 HISTORY — DX: Depression, unspecified: F32.A

## 2013-06-11 HISTORY — DX: Anemia, unspecified: D64.9

## 2013-06-11 HISTORY — DX: Drug induced constipation: K59.03

## 2013-06-11 HISTORY — DX: Effusion, left ankle: M25.471

## 2013-06-11 HISTORY — DX: Diarrhea, unspecified: R19.7

## 2013-06-11 HISTORY — DX: Frequency of micturition: R35.0

## 2013-06-11 HISTORY — DX: Gastro-esophageal reflux disease without esophagitis: K21.9

## 2013-06-11 LAB — URINE MICROSCOPIC-ADD ON

## 2013-06-11 LAB — BASIC METABOLIC PANEL
BUN: 19 mg/dL (ref 6–23)
CALCIUM: 9.9 mg/dL (ref 8.4–10.5)
CO2: 24 mEq/L (ref 19–32)
Chloride: 108 mEq/L (ref 96–112)
Creatinine, Ser: 1.04 mg/dL (ref 0.50–1.10)
GFR, EST AFRICAN AMERICAN: 59 mL/min — AB (ref >90)
GFR, EST NON AFRICAN AMERICAN: 51 mL/min — AB (ref >90)
GLUCOSE: 79 mg/dL (ref 70–99)
Potassium: 4.3 mEq/L (ref 3.7–5.3)
Sodium: 143 mEq/L (ref 137–147)

## 2013-06-11 LAB — URINALYSIS, ROUTINE W REFLEX MICROSCOPIC
BILIRUBIN URINE: NEGATIVE
Glucose, UA: NEGATIVE mg/dL
Hgb urine dipstick: NEGATIVE
KETONES UR: NEGATIVE mg/dL
NITRITE: NEGATIVE
Protein, ur: NEGATIVE mg/dL
SPECIFIC GRAVITY, URINE: 1.02 (ref 1.005–1.030)
Urobilinogen, UA: 0.2 mg/dL (ref 0.0–1.0)
pH: 5.5 (ref 5.0–8.0)

## 2013-06-11 LAB — CBC WITH DIFFERENTIAL/PLATELET
Basophils Absolute: 0.1 10*3/uL (ref 0.0–0.1)
Basophils Relative: 1 % (ref 0–1)
EOS ABS: 0.1 10*3/uL (ref 0.0–0.7)
Eosinophils Relative: 2 % (ref 0–5)
HCT: 34.6 % — ABNORMAL LOW (ref 36.0–46.0)
Hemoglobin: 11.2 g/dL — ABNORMAL LOW (ref 12.0–15.0)
LYMPHS ABS: 2.1 10*3/uL (ref 0.7–4.0)
Lymphocytes Relative: 34 % (ref 12–46)
MCH: 32.2 pg (ref 26.0–34.0)
MCHC: 32.4 g/dL (ref 30.0–36.0)
MCV: 99.4 fL (ref 78.0–100.0)
Monocytes Absolute: 0.4 10*3/uL (ref 0.1–1.0)
Monocytes Relative: 7 % (ref 3–12)
NEUTROS PCT: 56 % (ref 43–77)
Neutro Abs: 3.4 10*3/uL (ref 1.7–7.7)
PLATELETS: 249 10*3/uL (ref 150–400)
RBC: 3.48 MIL/uL — AB (ref 3.87–5.11)
RDW: 14.5 % (ref 11.5–15.5)
WBC: 6.1 10*3/uL (ref 4.0–10.5)

## 2013-06-11 LAB — SURGICAL PCR SCREEN
MRSA, PCR: NEGATIVE
STAPHYLOCOCCUS AUREUS: NEGATIVE

## 2013-06-11 LAB — ABO/RH: ABO/RH(D): O POS

## 2013-06-11 LAB — APTT: aPTT: 35 seconds (ref 24–37)

## 2013-06-11 LAB — PROTIME-INR
INR: 0.98 (ref 0.00–1.49)
Prothrombin Time: 12.8 seconds (ref 11.6–15.2)

## 2013-06-11 NOTE — Progress Notes (Signed)
PCP is Reymundo Poll, Cardiologist is Shelva Majestic, and Psychiatrist is Dr. Casimiro Needle. Patient was alert and oriented during PAT visit and son Gwyndolyn Saxon was at chair side during PAT visit. Patient denied having any acute cardiac or pulmonary issues. Patient informed Nurse that she lived at [facility] and Nurse asked if patient was having any problems at Nursing facility and Gwyndolyn Saxon (patients son) threw his hands up in the air and stated "we are not going to talk about this right now." Nurse then placed patients son in the waiting room and spoke to patient alone while she was having blood work obtained. Nurse questioned patient about Nursing facility and patient stated "there is a man that messes with me there...Marland Kitchenhe is tall and dark skinned. He lives in the ceiling. He causes my bowels to move, and he makes my hair fall out. He does not talk, but he works in [employer] at [employer]. At night I can hear him talking. He says that he is going to take my coat and he takes the wheels off my walker. I don't tell anyone about him and the things that he does to me because no one will believe me." Nurse then talked to patients son privately and he stated "this started about 7 years ago when she first started going to [facility]. She would see things on TV and relate them to reality. They stopped her from watching ER and Cops, because she was relating it to real life. One time she thought I was dead and when I walked in her room she said "oh you are alive?[name]... I thought you had died". It used to be real bad but since she has been seeing this new doctor he has been adjusting her medications and for the past year or so she has been doing a lot better."  Son stated that they are currently looking to find patient a new nursing facility to reside in and they were going to visit [facility] after PAT visit.

## 2013-06-11 NOTE — Progress Notes (Signed)
Nurse called [facility] and spoke with patients Nurse and inquired about patients mental status. Nurse stated that patient has periods of pyschosis that comes and goes. Nurse told the Nurse at [facility] about the conversation that we had and she then asked "Did she call the man Simona Huh." Nurse explained to her that patient did not say the mans name, but did state that he worked in [employer] at [employer], and Nurse then stated "yes, that's the same man that she talks about." Patient is currently seeing Dr. Casimiro Needle (Psychiatrist) at [facility] and Nurse at facility stated she would let Dr. Casimiro Needle know about our conversation. I then thanked Nurse for her time and the call ended.

## 2013-06-11 NOTE — Pre-Procedure Instructions (Signed)
Jennifer [name]  06/11/2013   Your procedure is scheduled on:  Wednesday, Jun 19, 2013 at 12:45 PM.  Report to [facility] Admitting at 10:45 AM.  Call this number if you have problems the morning of surgery: [phone]   Remember:   Do not eat food or drink liquids after midnight.   Take these medicines the morning of surgery with A SIP OF WATER: Aripiprazole (Abilify), Bupropion (Wellbutrin), Carvedilol (Coreg), Lorazepam (Ativan) if needed, Pantoprazole (Protonix), and Hydrocodone (Vicodin) if needed for pain.   Do not wear jewelry, make-up or nail polish.  Do not wear lotions, powders, or perfumes.   Do not shave 48 hours prior to surgery.   Do not bring valuables to the hospital.  [facility] is not responsible  for any belongings or valuables.               Contacts, dentures or bridgework may not be worn into surgery.  Leave suitcase in the car. After surgery it may be brought to your room.  For patients admitted to the hospital, discharge time is determined by your  treatment team.   Shower using CHG soap the night before and the morning of your surgery               Patients discharged the day of surgery will not be allowed to drive home.  Please read over the following fact sheets that you were given: Pain Booklet, Coughing and Deep Breathing, Blood Transfusion Information, MRSA Information and Surgical Site Infection Prevention

## 2013-06-12 NOTE — Progress Notes (Signed)
Spoke with Catalina Antigua at Dr. Damita Dunnings office to make MD aware of pt abnormal urine lab result.

## 2013-06-18 MED ORDER — CEFAZOLIN SODIUM-DEXTROSE 2-3 GM-% IV SOLR
2.0000 g | INTRAVENOUS | Status: AC
  Administered 2013-06-19: 2 g via INTRAVENOUS
  Filled 2013-06-18: qty 50

## 2013-06-18 NOTE — H&P (Signed)
TOTAL KNEE REVISION ADMISSION H&P  Patient is being admitted for right revision total knee arthroplasty.  Subjective:  Chief Complaint:right knee pain.  HPI: Jennifer [name], 78 y.o. female, has a history of pain and functional disability in the right knee(s) due to failed previous arthroplasty and patient has failed non-surgical conservative treatments for greater than 12 weeks to include NSAID's and/or analgesics, use of assistive devices and activity modification. The indications for the revision of the total knee arthroplasty are loosening of one or more components. Onset of symptoms was gradual starting several years ago with gradually worsening course since that time.  Prior procedures on the right knee(s) include arthroplasty.  Patient currently rates pain in the right knee(s) at 10 out of 10 with activity. There is night pain, worsening of pain with activity and weight bearing, pain that interferes with activities of daily living, pain with passive range of motion, crepitus and joint swelling.  Patient has evidence of prosthetic loosening by imaging studies. This condition presents safety issues increasing the risk of falls. There is no current active infection.  Patient Active Problem List   Diagnosis Date Noted  . Paget's disease of bone 11/29/2012  . Nausea alone 11/29/2012  . GERD (gastroesophageal reflux disease) 11/29/2012  . Nonischemic cardiomyopathy 11/29/2012  . Anemia 11/29/2012  . Depression 11/29/2012  . Chest pain 11/28/2012  . Abdominal pain, epigastric 11/28/2012  . UTERINE FIBROID 04/13/2006  . HYPOTHYROIDISM, UNSPECIFIED 04/13/2006  . HYPOPARATHYROIDISM 04/13/2006  . HYPERLIPIDEMIA 04/13/2006  . OBESITY, NOS 04/13/2006  . Pernicious anemia 04/13/2006  . ANXIETY 04/13/2006  . DEPRESSIVE DISORDER, NOS 04/13/2006  . GLAUCOMA 04/13/2006  . HYPERTENSION, BENIGN SYSTEMIC 04/13/2006  . GASTROESOPHAGEAL REFLUX, NO ESOPHAGITIS 04/13/2006  . CKD (chronic kidney disease)  stage 3, GFR 30-59 ml/min 04/13/2006  . OSTEOARTHRITIS, LOWER LEG 04/13/2006  . ROTATOR CUFF TENDONITIS 04/13/2006  . OSTEOPOROSIS, UNSPECIFIED 04/13/2006  . COSTOCHONDRITIS 04/13/2006  . INSOMNIA NOS 04/13/2006  . PAIN, GENERALIZED 04/13/2006  . INCONTINENCE, URGE 04/13/2006   Past Medical History  Diagnosis Date  . Dementia   . Arthritis   . Blood transfusion   . Hypertension   . Psychosis   . Cardiomyopathy, nonischemic     EF initially 30% but improved with medical therapy, EF now 50-55%.  [name] LBBB (left bundle branch block)   . Paget's disease of bone 11/29/2012  . 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 Anginal pain     pt has chest pain that comes and goes Dr. Claiborne Billings heart Dr is aware  . Seizures     approximately 20 years since last seizure  . Frequency of urination     at night  . Diarrhea   . Constipation due to pain medication   . GERD (gastroesophageal reflux disease)   . Anemia   . Swelling of both ankles     Takes Lasix if needed  . Glaucoma   . Depression   . Anxiety   . TIA (transient ischemic attack)     Past Surgical History  Procedure Laterality Date  . Cholecystectomy    . Esophagogastroduodenoscopy N/A 11/30/2012    Procedure: ESOPHAGOGASTRODUODENOSCOPY (EGD);  Surgeon: Wonda Horner, MD;  Location: [facility] ENDOSCOPY;  Service: Endoscopy;  Laterality: N/A;  . Tubal ligation    . Joint replacement Bilateral     knees  . Tonsillectomy    . Cardiac catheterization    .  Foot surgery Left   . Breast lumpectomy Left     x 2  . Shoulder surgery Right   . Eye surgery      cataract removal pt unsure which eye    No prescriptions prior to admission   Allergies  Allergen Reactions  . Codeine Other (See Comments)    Makes her sick on her stomach.    History  Substance Use Topics  . Smoking status: Never Smoker   . Smokeless tobacco: Current User    Types: Snuff  . Alcohol Use: No    No  family history on file.    Review of Systems  Constitutional: Negative.   HENT: Positive for tinnitus.   Eyes:       Glasses  Respiratory: Negative.   Cardiovascular: Negative.   Gastrointestinal: Negative.   Genitourinary: Positive for dysuria.  Musculoskeletal: Positive for joint pain.  Skin: Negative.   Neurological: Negative.   Endo/Heme/Allergies: Negative.   Psychiatric/Behavioral: Positive for depression.     Objective:  Physical Exam  Constitutional: She is oriented to person, place, and time. She appears well-developed and well-nourished.  HENT:  Head: Normocephalic and atraumatic.  Eyes: Pupils are equal, round, and reactive to light.  Neck: Normal range of motion. Neck supple.  Cardiovascular: Intact distal pulses.   Respiratory: Effort normal.  Musculoskeletal: She exhibits tenderness.  Patient's left knee does have a mild effusion but no erythema or warmth.  She has a range from 0-110.  Patient's calves are soft and nontender bilaterally.  Patient's right knee also has a mild effusion and does have a varus deformity.  She has a range from approximately -5 to 100.  Mild tenderness diffusely over the knee.  She has brisk capillary refill and is neurovascularly intact distally.  Neurological: She is alert and oriented to person, place, and time.  Skin: Skin is warm and dry.  Psychiatric: She has a normal mood and affect. Her behavior is normal. Judgment and thought content normal.    Vital signs in last 24 hours:    Labs:  Estimated body mass index is 32.58 kg/(m^2) as calculated from the following:   Height as of 03/19/13: 5\' 2"  (1.575 m).   Weight as of 03/19/13: 80.831 kg (178 lb 3.2 oz).  Imaging Review Right knee AP lateral and sunrise view show significant varus deformity and tibial component collapse medially. X-rays of the left knee AP lateral and sunrise views show excellent position of the total knee prosthesis. AP pelvis and lateral of the right hip  shows mild degenerative changes. No acute bony or joint abnormality.  Assessment/Plan:  End stage arthritis, right knee(s) with failed previous arthroplasty [employer]ics [place] PS.   The patient history, physical examination, clinical judgment of the provider and imaging studies are consistent with end stage degenerative joint disease of the right knee(s), previous total knee arthroplasty. Revision total knee arthroplasty is deemed medically necessary. The treatment options including medical management, injection therapy, arthroscopy and revision arthroplasty were discussed at length. The risks and benefits of revision total knee arthroplasty were presented and reviewed. The risks due to aseptic loosening, infection, stiffness, patella tracking problems, thromboembolic complications and other imponderables were discussed. The patient acknowledged the explanation, agreed to proceed with the plan and consent was signed. Patient is being admitted for inpatient treatment for surgery, pain control, PT, OT, prophylactic antibiotics, VTE prophylaxis, progressive ambulation and ADL's and discharge planning.The patient is planning to be discharged to skilled nursing facility

## 2013-06-19 ENCOUNTER — Encounter (HOSPITAL_COMMUNITY): Payer: Self-pay | Admitting: *Deleted

## 2013-06-19 ENCOUNTER — Inpatient Hospital Stay (HOSPITAL_COMMUNITY): Payer: Medicare Other | Admitting: Anesthesiology

## 2013-06-19 ENCOUNTER — Encounter (HOSPITAL_COMMUNITY)
Admission: RE | Disposition: A | Discharge status: Skilled Nursing Facility | Payer: Self-pay | Source: Ambulatory Visit | Attending: Orthopedic Surgery

## 2013-06-19 ENCOUNTER — Encounter (HOSPITAL_COMMUNITY): Payer: Medicare Other | Admitting: Anesthesiology

## 2013-06-19 ENCOUNTER — Inpatient Hospital Stay (HOSPITAL_COMMUNITY)
Admission: RE | Admit: 2013-06-19 | Discharge: 2013-06-23 | DRG: 467 | Disposition: A | Discharge status: Skilled Nursing Facility | Payer: Medicare Other | Source: Ambulatory Visit | Attending: Orthopedic Surgery | Admitting: Orthopedic Surgery

## 2013-06-19 ENCOUNTER — Inpatient Hospital Stay (HOSPITAL_COMMUNITY): Payer: Medicare Other

## 2013-06-19 DIAGNOSIS — F0390 Unspecified dementia without behavioral disturbance: Secondary | ICD-10-CM | POA: Diagnosis present

## 2013-06-19 DIAGNOSIS — E785 Hyperlipidemia, unspecified: Secondary | ICD-10-CM | POA: Diagnosis present

## 2013-06-19 DIAGNOSIS — I447 Left bundle-branch block, unspecified: Secondary | ICD-10-CM | POA: Diagnosis present

## 2013-06-19 DIAGNOSIS — Z96659 Presence of unspecified artificial knee joint: Secondary | ICD-10-CM

## 2013-06-19 DIAGNOSIS — T84018A Broken internal joint prosthesis, other site, initial encounter: Secondary | ICD-10-CM

## 2013-06-19 DIAGNOSIS — Y831 Surgical operation with implant of artificial internal device as the cause of abnormal reaction of the patient, or of later complication, without mention of misadventure at the time of the procedure: Secondary | ICD-10-CM | POA: Diagnosis present

## 2013-06-19 DIAGNOSIS — F411 Generalized anxiety disorder: Secondary | ICD-10-CM | POA: Diagnosis present

## 2013-06-19 DIAGNOSIS — Z7982 Long term (current) use of aspirin: Secondary | ICD-10-CM

## 2013-06-19 DIAGNOSIS — H409 Unspecified glaucoma: Secondary | ICD-10-CM | POA: Diagnosis present

## 2013-06-19 DIAGNOSIS — E669 Obesity, unspecified: Secondary | ICD-10-CM | POA: Diagnosis present

## 2013-06-19 DIAGNOSIS — Z6835 Body mass index (BMI) 35.0-35.9, adult: Secondary | ICD-10-CM

## 2013-06-19 DIAGNOSIS — F3289 Other specified depressive episodes: Secondary | ICD-10-CM | POA: Diagnosis present

## 2013-06-19 DIAGNOSIS — T84099A Other mechanical complication of unspecified internal joint prosthesis, initial encounter: Principal | ICD-10-CM | POA: Diagnosis present

## 2013-06-19 DIAGNOSIS — D62 Acute posthemorrhagic anemia: Secondary | ICD-10-CM | POA: Diagnosis not present

## 2013-06-19 DIAGNOSIS — N183 Chronic kidney disease, stage 3 unspecified: Secondary | ICD-10-CM | POA: Diagnosis present

## 2013-06-19 DIAGNOSIS — T84098A Other mechanical complication of other internal joint prosthesis, initial encounter: Secondary | ICD-10-CM

## 2013-06-19 DIAGNOSIS — M889 Osteitis deformans of unspecified bone: Secondary | ICD-10-CM | POA: Diagnosis present

## 2013-06-19 DIAGNOSIS — F329 Major depressive disorder, single episode, unspecified: Secondary | ICD-10-CM | POA: Diagnosis present

## 2013-06-19 DIAGNOSIS — H9319 Tinnitus, unspecified ear: Secondary | ICD-10-CM | POA: Diagnosis present

## 2013-06-19 DIAGNOSIS — E039 Hypothyroidism, unspecified: Secondary | ICD-10-CM | POA: Diagnosis present

## 2013-06-19 DIAGNOSIS — I428 Other cardiomyopathies: Secondary | ICD-10-CM | POA: Diagnosis present

## 2013-06-19 DIAGNOSIS — I129 Hypertensive chronic kidney disease with stage 1 through stage 4 chronic kidney disease, or unspecified chronic kidney disease: Secondary | ICD-10-CM | POA: Diagnosis present

## 2013-06-19 DIAGNOSIS — Z8673 Personal history of transient ischemic attack (TIA), and cerebral infarction without residual deficits: Secondary | ICD-10-CM

## 2013-06-19 DIAGNOSIS — M810 Age-related osteoporosis without current pathological fracture: Secondary | ICD-10-CM | POA: Diagnosis present

## 2013-06-19 DIAGNOSIS — Z79899 Other long term (current) drug therapy: Secondary | ICD-10-CM

## 2013-06-19 DIAGNOSIS — K219 Gastro-esophageal reflux disease without esophagitis: Secondary | ICD-10-CM | POA: Diagnosis present

## 2013-06-19 DIAGNOSIS — Z9849 Cataract extraction status, unspecified eye: Secondary | ICD-10-CM

## 2013-06-19 HISTORY — PX: TOTAL KNEE REVISION: SHX996

## 2013-06-19 SURGERY — TOTAL KNEE REVISION
Anesthesia: Regional | Site: Knee | Laterality: Right

## 2013-06-19 MED ORDER — HYDROMORPHONE HCL PF 1 MG/ML IJ SOLN
1.0000 mg | INTRAMUSCULAR | Status: DC | PRN
  Administered 2013-06-20 – 2013-06-22 (×8): 1 mg via INTRAVENOUS
  Filled 2013-06-19 (×9): qty 1

## 2013-06-19 MED ORDER — ONDANSETRON HCL 4 MG/2ML IJ SOLN: 4.0000 mg | Freq: Four times a day (QID) | INTRAMUSCULAR | Status: DC | PRN

## 2013-06-19 MED ORDER — MIDAZOLAM HCL 2 MG/2ML IJ SOLN
1.0000 mg | INTRAMUSCULAR | Status: DC | PRN
  Administered 2013-06-19: 0.25 mg via INTRAVENOUS

## 2013-06-19 MED ORDER — FENTANYL CITRATE 0.05 MG/ML IJ SOLN
50.0000 ug | INTRAMUSCULAR | Status: DC | PRN
  Administered 2013-06-19: 50 ug via INTRAVENOUS

## 2013-06-19 MED ORDER — LIDOCAINE HCL (CARDIAC) 20 MG/ML IV SOLN
INTRAVENOUS | Status: AC
  Filled 2013-06-19: qty 5

## 2013-06-19 MED ORDER — LACTATED RINGERS IV SOLN
INTRAVENOUS | Status: DC | PRN
  Administered 2013-06-19 (×2): via INTRAVENOUS

## 2013-06-19 MED ORDER — PROPOFOL 10 MG/ML IV BOLUS
INTRAVENOUS | Status: DC | PRN
  Administered 2013-06-19: 100 mg via INTRAVENOUS

## 2013-06-19 MED ORDER — BUPIVACAINE LIPOSOME 1.3 % IJ SUSP
INTRAMUSCULAR | Status: DC | PRN
  Administered 2013-06-19: 20 mL

## 2013-06-19 MED ORDER — ALUM & MAG HYDROXIDE-SIMETH 200-200-20 MG/5ML PO SUSP: 30.0000 mL | ORAL | Status: DC | PRN

## 2013-06-19 MED ORDER — OXYCODONE HCL 5 MG PO TABS
5.0000 mg | ORAL_TABLET | ORAL | Status: DC | PRN
  Administered 2013-06-19 – 2013-06-23 (×14): 10 mg via ORAL
  Filled 2013-06-19 (×13): qty 2

## 2013-06-19 MED ORDER — ONDANSETRON HCL 4 MG/2ML IJ SOLN
INTRAMUSCULAR | Status: AC
  Filled 2013-06-19: qty 2

## 2013-06-19 MED ORDER — METOCLOPRAMIDE HCL 10 MG PO TABS
5.0000 mg | ORAL_TABLET | Freq: Three times a day (TID) | ORAL | Status: DC | PRN
  Administered 2013-06-23: 10 mg via ORAL
  Filled 2013-06-19: qty 1

## 2013-06-19 MED ORDER — FENTANYL CITRATE 0.05 MG/ML IJ SOLN
INTRAMUSCULAR | Status: DC | PRN
  Administered 2013-06-19: 50 ug via INTRAVENOUS
  Administered 2013-06-19: 25 ug via INTRAVENOUS
  Administered 2013-06-19: 50 ug via INTRAVENOUS
  Administered 2013-06-19: 75 ug via INTRAVENOUS
  Administered 2013-06-19: 50 ug via INTRAVENOUS

## 2013-06-19 MED ORDER — BISACODYL 5 MG PO TBEC
5.0000 mg | DELAYED_RELEASE_TABLET | Freq: Every day | ORAL | Status: DC | PRN
  Administered 2013-06-23: 5 mg via ORAL
  Filled 2013-06-19: qty 1

## 2013-06-19 MED ORDER — FENTANYL 25 MCG/HR TD PT72
25.0000 ug | MEDICATED_PATCH | TRANSDERMAL | Status: DC
  Administered 2013-06-19: 25 ug via TRANSDERMAL
  Filled 2013-06-19 (×2): qty 1

## 2013-06-19 MED ORDER — SODIUM CHLORIDE 0.9 % IV SOLN
INTRAVENOUS | Status: DC
  Administered 2013-06-19: 12:00:00 via INTRAVENOUS

## 2013-06-19 MED ORDER — OXYCODONE HCL 5 MG/5ML PO SOLN: 5.0000 mg | Freq: Once | ORAL | Status: DC | PRN

## 2013-06-19 MED ORDER — DOCUSATE SODIUM 100 MG PO CAPS
100.0000 mg | ORAL_CAPSULE | Freq: Two times a day (BID) | ORAL | Status: DC
  Administered 2013-06-19 – 2013-06-23 (×8): 100 mg via ORAL
  Filled 2013-06-19 (×10): qty 1

## 2013-06-19 MED ORDER — 0.9 % SODIUM CHLORIDE (POUR BTL) OPTIME
TOPICAL | Status: DC | PRN
Start: 2013-06-19 — End: 2013-06-19
  Administered 2013-06-19: 1000 mL

## 2013-06-19 MED ORDER — PROPOFOL 10 MG/ML IV BOLUS
INTRAVENOUS | Status: AC
  Filled 2013-06-19: qty 20

## 2013-06-19 MED ORDER — FENTANYL CITRATE 0.05 MG/ML IJ SOLN
INTRAMUSCULAR | Status: AC
Start: 2013-06-19 — End: 2013-06-19
  Filled 2013-06-19: qty 5

## 2013-06-19 MED ORDER — HYDROMORPHONE HCL PF 1 MG/ML IJ SOLN
INTRAMUSCULAR | Status: AC
  Administered 2013-06-19: 0.5 mg via INTRAVENOUS
  Filled 2013-06-19: qty 1

## 2013-06-19 MED ORDER — BUPIVACAINE-EPINEPHRINE (PF) 0.5% -1:200000 IJ SOLN
INTRAMUSCULAR | Status: DC | PRN
  Administered 2013-06-19: 30 mL

## 2013-06-19 MED ORDER — METOCLOPRAMIDE HCL 5 MG/ML IJ SOLN
10.0000 mg | Freq: Once | INTRAMUSCULAR | Status: DC | PRN
Start: 2013-06-19 — End: 2013-06-19

## 2013-06-19 MED ORDER — ACETAMINOPHEN 650 MG RE SUPP: 650.0000 mg | Freq: Four times a day (QID) | RECTAL | Status: DC | PRN

## 2013-06-19 MED ORDER — OXYCODONE HCL 5 MG PO TABS
ORAL_TABLET | ORAL | Status: AC
  Filled 2013-06-19: qty 2

## 2013-06-19 MED ORDER — BUPIVACAINE LIPOSOME 1.3 % IJ SUSP
20.0000 mL | Freq: Once | INTRAMUSCULAR | Status: DC
  Filled 2013-06-19: qty 20

## 2013-06-19 MED ORDER — TEMAZEPAM 15 MG PO CAPS
15.0000 mg | ORAL_CAPSULE | Freq: Every day | ORAL | Status: DC
  Administered 2013-06-19 – 2013-06-22 (×4): 15 mg via ORAL
  Filled 2013-06-19 (×4): qty 1

## 2013-06-19 MED ORDER — HYDROMORPHONE HCL PF 1 MG/ML IJ SOLN
0.2500 mg | INTRAMUSCULAR | Status: DC | PRN
  Administered 2013-06-19 (×4): 0.5 mg via INTRAVENOUS

## 2013-06-19 MED ORDER — KCL IN DEXTROSE-NACL 20-5-0.45 MEQ/L-%-% IV SOLN
INTRAVENOUS | Status: DC
  Administered 2013-06-20 – 2013-06-23 (×4): via INTRAVENOUS
  Filled 2013-06-19 (×13): qty 1000

## 2013-06-19 MED ORDER — BUPROPION HCL ER (XL) 150 MG PO TB24
150.0000 mg | ORAL_TABLET | Freq: Every morning | ORAL | Status: DC
  Administered 2013-06-20 – 2013-06-23 (×4): 150 mg via ORAL
  Filled 2013-06-19 (×4): qty 1

## 2013-06-19 MED ORDER — SODIUM CHLORIDE 0.9 % IR SOLN
Status: DC | PRN
  Administered 2013-06-19 (×2): 1000 mL

## 2013-06-19 MED ORDER — ACETAMINOPHEN 325 MG PO TABS
650.0000 mg | ORAL_TABLET | Freq: Four times a day (QID) | ORAL | Status: DC | PRN
  Administered 2013-06-21: 650 mg via ORAL
  Filled 2013-06-19: qty 2

## 2013-06-19 MED ORDER — MAGNESIUM CITRATE PO SOLN: 1.0000 | Freq: Once | ORAL | Status: AC | PRN

## 2013-06-19 MED ORDER — LISINOPRIL 10 MG PO TABS
10.0000 mg | ORAL_TABLET | Freq: Every morning | ORAL | Status: DC
  Administered 2013-06-20 – 2013-06-23 (×4): 10 mg via ORAL
  Filled 2013-06-19 (×4): qty 1

## 2013-06-19 MED ORDER — ROCURONIUM BROMIDE 100 MG/10ML IV SOLN
INTRAVENOUS | Status: DC | PRN
  Administered 2013-06-19: 35 mg via INTRAVENOUS

## 2013-06-19 MED ORDER — MIDAZOLAM HCL 2 MG/2ML IJ SOLN
INTRAMUSCULAR | Status: AC
  Filled 2013-06-19: qty 2

## 2013-06-19 MED ORDER — FUROSEMIDE 20 MG PO TABS
20.0000 mg | ORAL_TABLET | Freq: Every day | ORAL | Status: DC | PRN
  Filled 2013-06-19: qty 1

## 2013-06-19 MED ORDER — RISPERIDONE 0.25 MG PO TABS
0.2500 mg | ORAL_TABLET | Freq: Every day | ORAL | Status: DC
  Filled 2013-06-19: qty 1

## 2013-06-19 MED ORDER — LORAZEPAM 0.5 MG PO TABS: 0.5000 mg | ORAL_TABLET | Freq: Two times a day (BID) | ORAL | Status: DC

## 2013-06-19 MED ORDER — ONDANSETRON HCL 4 MG PO TABS: 4.0000 mg | ORAL_TABLET | Freq: Four times a day (QID) | ORAL | Status: DC | PRN

## 2013-06-19 MED ORDER — MENTHOL 3 MG MT LOZG: 1.0000 | LOZENGE | OROMUCOSAL | Status: DC | PRN

## 2013-06-19 MED ORDER — ASPIRIN EC 325 MG PO TBEC
325.0000 mg | DELAYED_RELEASE_TABLET | Freq: Every day | ORAL | Status: DC
  Administered 2013-06-20 – 2013-06-23 (×4): 325 mg via ORAL
  Filled 2013-06-19 (×5): qty 1

## 2013-06-19 MED ORDER — OXYCODONE HCL 5 MG PO TABS: 5.0000 mg | ORAL_TABLET | Freq: Once | ORAL | Status: DC | PRN

## 2013-06-19 MED ORDER — ARIPIPRAZOLE 5 MG PO TABS
5.0000 mg | ORAL_TABLET | Freq: Every morning | ORAL | Status: DC
  Administered 2013-06-20 – 2013-06-23 (×4): 5 mg via ORAL
  Filled 2013-06-19 (×4): qty 1

## 2013-06-19 MED ORDER — RISPERIDONE 0.5 MG PO TABS
0.5000 mg | ORAL_TABLET | Freq: Every day | ORAL | Status: AC
  Administered 2013-06-19 – 2013-06-22 (×4): 0.5 mg via ORAL
  Filled 2013-06-19 (×4): qty 1

## 2013-06-19 MED ORDER — MIDAZOLAM HCL 2 MG/2ML IJ SOLN
INTRAMUSCULAR | Status: AC
  Administered 2013-06-19: 0.25 mg via INTRAVENOUS
  Filled 2013-06-19: qty 2

## 2013-06-19 MED ORDER — LIDOCAINE HCL (CARDIAC) 20 MG/ML IV SOLN
INTRAVENOUS | Status: DC | PRN
  Administered 2013-06-19: 60 mg via INTRAVENOUS

## 2013-06-19 MED ORDER — METHOCARBAMOL 1000 MG/10ML IJ SOLN
500.0000 mg | Freq: Four times a day (QID) | INTRAVENOUS | Status: DC | PRN
  Administered 2013-06-19: 500 mg via INTRAVENOUS
  Filled 2013-06-19: qty 5

## 2013-06-19 MED ORDER — DIPHENHYDRAMINE HCL 12.5 MG/5ML PO ELIX: 12.5000 mg | ORAL_SOLUTION | ORAL | Status: DC | PRN

## 2013-06-19 MED ORDER — METHOCARBAMOL 500 MG PO TABS
500.0000 mg | ORAL_TABLET | Freq: Four times a day (QID) | ORAL | Status: DC | PRN
  Administered 2013-06-19 – 2013-06-23 (×6): 500 mg via ORAL
  Filled 2013-06-19 (×7): qty 1

## 2013-06-19 MED ORDER — FENTANYL CITRATE 0.05 MG/ML IJ SOLN
INTRAMUSCULAR | Status: AC
  Administered 2013-06-19: 50 ug via INTRAVENOUS
  Filled 2013-06-19: qty 2

## 2013-06-19 MED ORDER — LATANOPROST 0.005 % OP SOLN
1.0000 [drp] | Freq: Every day | OPHTHALMIC | Status: DC
  Administered 2013-06-19 – 2013-06-22 (×4): 1 [drp] via OPHTHALMIC
  Filled 2013-06-19: qty 2.5

## 2013-06-19 MED ORDER — PHENOL 1.4 % MT LIQD: 1.0000 | OROMUCOSAL | Status: DC | PRN

## 2013-06-19 MED ORDER — RISPERIDONE 0.25 MG PO TABS: 0.2500 mg | ORAL_TABLET | Freq: Every day | ORAL | Status: DC

## 2013-06-19 MED ORDER — FERROUS SULFATE 325 (65 FE) MG PO TABS
325.0000 mg | ORAL_TABLET | ORAL | Status: DC
  Administered 2013-06-21: 325 mg via ORAL
  Filled 2013-06-19 (×2): qty 1

## 2013-06-19 MED ORDER — NEOSTIGMINE METHYLSULFATE 10 MG/10ML IV SOLN
INTRAVENOUS | Status: DC | PRN
  Administered 2013-06-19: 3 mg via INTRAVENOUS

## 2013-06-19 MED ORDER — LORAZEPAM 0.5 MG PO TABS
0.5000 mg | ORAL_TABLET | Freq: Every morning | ORAL | Status: DC
  Administered 2013-06-20 – 2013-06-23 (×4): 0.5 mg via ORAL
  Filled 2013-06-19 (×4): qty 1

## 2013-06-19 MED ORDER — SENNOSIDES-DOCUSATE SODIUM 8.6-50 MG PO TABS: 1.0000 | ORAL_TABLET | Freq: Every evening | ORAL | Status: DC | PRN

## 2013-06-19 MED ORDER — METOCLOPRAMIDE HCL 5 MG/ML IJ SOLN: 5.0000 mg | Freq: Three times a day (TID) | INTRAMUSCULAR | Status: DC | PRN

## 2013-06-19 MED ORDER — CARVEDILOL 12.5 MG PO TABS
12.5000 mg | ORAL_TABLET | Freq: Two times a day (BID) | ORAL | Status: DC
  Administered 2013-06-20 – 2013-06-23 (×7): 12.5 mg via ORAL
  Filled 2013-06-19 (×9): qty 1

## 2013-06-19 MED ORDER — LORAZEPAM 1 MG PO TABS
1.0000 mg | ORAL_TABLET | Freq: Every evening | ORAL | Status: DC
  Administered 2013-06-19 – 2013-06-22 (×3): 1 mg via ORAL
  Filled 2013-06-19 (×4): qty 1

## 2013-06-19 MED ORDER — PANTOPRAZOLE SODIUM 40 MG PO TBEC
40.0000 mg | DELAYED_RELEASE_TABLET | Freq: Every day | ORAL | Status: DC
  Administered 2013-06-20 – 2013-06-21 (×2): 40 mg via ORAL
  Filled 2013-06-19 (×2): qty 1

## 2013-06-19 MED ORDER — GLYCOPYRROLATE 0.2 MG/ML IJ SOLN
INTRAMUSCULAR | Status: DC | PRN
  Administered 2013-06-19: 0.4 mg via INTRAVENOUS

## 2013-06-19 MED ORDER — DEXTROSE-NACL 5-0.45 % IV SOLN: INTRAVENOUS | Status: DC

## 2013-06-19 SURGICAL SUPPLY — 75 items
ADAPTER OFFSET ASSY (Orthopedic Implant) ×3 IMPLANT
BANDAGE ELASTIC 4 VELCRO ST LF (GAUZE/BANDAGES/DRESSINGS) ×3 IMPLANT
BANDAGE ELASTIC 6 VELCRO ST LF (GAUZE/BANDAGES/DRESSINGS) ×3 IMPLANT
BANDAGE ESMARK 6X9 LF (GAUZE/BANDAGES/DRESSINGS) ×1 IMPLANT
BLADE SAG 18X100X1.27 (BLADE) ×3 IMPLANT
BLADE SAW SAG 90X13X1.27 (BLADE) ×3 IMPLANT
BLADE SURG 10 STRL SS (BLADE) ×6 IMPLANT
BLADE SURG ROTATE 9660 (MISCELLANEOUS) IMPLANT
BNDG ESMARK 6X9 LF (GAUZE/BANDAGES/DRESSINGS) ×3
BOWL SMART MIX CTS (DISPOSABLE) ×3 IMPLANT
BUR EGG ELITE 4.0 (BURR) ×2 IMPLANT
BUR EGG ELITE 4.0MM (BURR) ×1
CEMENT HV SMART SET (Cement) ×6 IMPLANT
COVER BACK TABLE 24X17X13 BIG (DRAPES) IMPLANT
COVER SURGICAL LIGHT HANDLE (MISCELLANEOUS) ×3 IMPLANT
CUFF TOURNIQUET SINGLE 34IN LL (TOURNIQUET CUFF) ×3 IMPLANT
CUFF TOURNIQUET SINGLE 44IN (TOURNIQUET CUFF) IMPLANT
DISC DIAMOND MED (BURR) IMPLANT
DRAPE EXTREMITY T 121X128X90 (DRAPE) ×3 IMPLANT
DRAPE INCISE IOBAN 66X45 STRL (DRAPES) ×3 IMPLANT
DRAPE PROXIMA HALF (DRAPES) ×3 IMPLANT
DRAPE U-SHAPE 47X51 STRL (DRAPES) ×3 IMPLANT
DURAPREP 26ML APPLICATOR (WOUND CARE) ×3 IMPLANT
ELECT REM PT RETURN 9FT ADLT (ELECTROSURGICAL) ×3
ELECTRODE REM PT RTRN 9FT ADLT (ELECTROSURGICAL) ×1 IMPLANT
EVACUATOR 1/8 PVC DRAIN (DRAIN) ×3 IMPLANT
GAUZE XEROFORM 1X8 LF (GAUZE/BANDAGES/DRESSINGS) ×3 IMPLANT
GLOVE BIO SURGEON STRL SZ7.5 (GLOVE) ×12 IMPLANT
GLOVE BIO SURGEON STRL SZ8.5 (GLOVE) ×3 IMPLANT
GLOVE BIOGEL PI IND STRL 6.5 (GLOVE) ×1 IMPLANT
GLOVE BIOGEL PI IND STRL 7.0 (GLOVE) ×1 IMPLANT
GLOVE BIOGEL PI IND STRL 8 (GLOVE) ×1 IMPLANT
GLOVE BIOGEL PI IND STRL 9 (GLOVE) ×1 IMPLANT
GLOVE BIOGEL PI INDICATOR 6.5 (GLOVE) ×2
GLOVE BIOGEL PI INDICATOR 7.0 (GLOVE) ×2
GLOVE BIOGEL PI INDICATOR 8 (GLOVE) ×2
GLOVE BIOGEL PI INDICATOR 9 (GLOVE) ×2
GLOVE ECLIPSE 6.5 STRL STRAW (GLOVE) ×6 IMPLANT
GOWN STRL REUS W/ TWL LRG LVL3 (GOWN DISPOSABLE) ×1 IMPLANT
GOWN STRL REUS W/ TWL XL LVL3 (GOWN DISPOSABLE) ×3 IMPLANT
GOWN STRL REUS W/TWL LRG LVL3 (GOWN DISPOSABLE) ×2
GOWN STRL REUS W/TWL XL LVL3 (GOWN DISPOSABLE) ×6
HANDPIECE INTERPULSE COAX TIP (DISPOSABLE) ×2
HOOD PEEL AWAY FACE SHEILD DIS (HOOD) ×6 IMPLANT
INSERT TIBIA SCORPIO FLEX 9X15 (Orthopedic Implant) ×3 IMPLANT
KIT BASIN OR (CUSTOM PROCEDURE TRAY) ×3 IMPLANT
KIT ROOM TURNOVER OR (KITS) ×3 IMPLANT
MANIFOLD NEPTUNE II (INSTRUMENTS) ×3 IMPLANT
NEEDLE SPNL 18GX3.5 QUINCKE PK (NEEDLE) ×3 IMPLANT
NS IRRIG 1000ML POUR BTL (IV SOLUTION) ×3 IMPLANT
PACK TOTAL JOINT (CUSTOM PROCEDURE TRAY) ×3 IMPLANT
PAD ARMBOARD 7.5X6 YLW CONV (MISCELLANEOUS) ×6 IMPLANT
PAD CAST 4YDX4 CTTN HI CHSV (CAST SUPPLIES) ×1 IMPLANT
PADDING CAST COTTON 4X4 STRL (CAST SUPPLIES) ×2
PADDING CAST COTTON 6X4 STRL (CAST SUPPLIES) ×3 IMPLANT
RASP HELIOCORDIAL MED (MISCELLANEOUS) IMPLANT
SET HNDPC FAN SPRY TIP SCT (DISPOSABLE) ×1 IMPLANT
SPONGE GAUZE 4X4 12PLY (GAUZE/BANDAGES/DRESSINGS) ×6 IMPLANT
STAPLER VISISTAT 35W (STAPLE) ×3 IMPLANT
STEM REGULAR FLUTED (Stem) ×3 IMPLANT
SUCTION FRAZIER TIP 10 FR DISP (SUCTIONS) ×3 IMPLANT
SUT VIC AB 0 CT1 27 (SUTURE) ×2
SUT VIC AB 0 CT1 27XBRD ANBCTR (SUTURE) ×1 IMPLANT
SUT VIC AB 1 CTX 36 (SUTURE) ×2
SUT VIC AB 1 CTX36XBRD ANBCTR (SUTURE) ×1 IMPLANT
SUT VIC AB 2-0 CT1 27 (SUTURE) ×2
SUT VIC AB 2-0 CT1 TAPERPNT 27 (SUTURE) ×1 IMPLANT
SWAB COLLECTION DEVICE MRSA (MISCELLANEOUS) ×3 IMPLANT
SYR 50ML LL SCALE MARK (SYRINGE) ×3 IMPLANT
TOWEL OR 17X24 6PK STRL BLUE (TOWEL DISPOSABLE) ×3 IMPLANT
TOWEL OR 17X26 10 PK STRL BLUE (TOWEL DISPOSABLE) ×3 IMPLANT
TRAY COMP TIB MOD SZ 9 (Orthopedic Implant) ×1 IMPLANT
TRAY FOLEY CATH 14FR (SET/KITS/TRAYS/PACK) ×3 IMPLANT
TRAY TIBIAL  MOD (Orthopedic Implant) ×2 IMPLANT
TUBE ANAEROBIC SPECIMEN COL (MISCELLANEOUS) ×3 IMPLANT

## 2013-06-19 NOTE — Progress Notes (Signed)
Dr. Orene Desanctis at bedside.

## 2013-06-19 NOTE — Anesthesia Preprocedure Evaluation (Addendum)
Anesthesia Evaluation  Patient identified by MRN, date of birth, ID band Patient awake    Reviewed: Allergy & Precautions, H&P , NPO status   History of Anesthesia Complications Negative for: history of anesthetic complications  Airway Mallampati: II TM Distance: >3 FB Neck ROM: Limited    Dental  (+) Edentulous Upper, Edentulous Lower, Dental Advisory Given   Pulmonary neg pulmonary ROS,  breath sounds clear to auscultation        Cardiovascular hypertension, + dysrhythmias Rhythm:Regular Rate:Normal  Myocardial scan done for preop clearance , WNL   Neuro/Psych Seizures -,     GI/Hepatic GERD-  ,  Endo/Other  Hypothyroidism Morbid obesity  Renal/GU Renal InsufficiencyRenal disease     Musculoskeletal  (+) Arthritis -,   Abdominal (+) + obese,   Peds  Hematology  (+) anemia ,   Anesthesia Other Findings   Reproductive/Obstetrics                         Anesthesia Physical Anesthesia Plan  ASA: III  Anesthesia Plan: General   Post-op Pain Management:    Induction: Intravenous  Airway Management Planned: Oral ETT  Additional Equipment:   Intra-op Plan:   Post-operative Plan: Extubation in OR  Informed Consent: I have reviewed the patients History and Physical, chart, labs and discussed the procedure including the risks, benefits and alternatives for the proposed anesthesia with the patient or authorized representative who has indicated his/her understanding and acceptance.     Plan Discussed with:   Anesthesia Plan Comments:         Anesthesia Quick Evaluation

## 2013-06-19 NOTE — Op Note (Addendum)
DATE OF PROCEDURE: 10/26/2012  PREOPERATIVE DIAGNOSIS: Grossly loose tibial component from [facility] primary right total knee that was placed in the year 2000 Estimated body mass index is 35.23 kg/(m^2) as calculated from the following:  Height as of this encounter: 6' 2.5" (1.892 m).  Weight as of this encounter: 126.1 kg (278 lb).  POSTOPERATIVE DIAGNOSIS: Grossly loose tibial component from [employer] right total knee placed in the year 2000 with approximately 1/2 cm of bone loss posterior medially. PROCEDURE: Removal of loose tibial component from [employer] total knee, and revision to a new Stryker TS #9 tibial component with an 8 mm off set at 230 and a 155 mm x 11 mm fluted uncemented stem SURGEON: Katielynn Horan J  ASSISTANT: Eric K. Barton Dubois (present throughout entire procedure and necessary for timely completion of the procedure)  ANESTHESIA: GET  DRAINS: foley, 2 medium hemovac in knee joint. TOURNIQUET TIME: 95 min  COMPLICATIONS: None SPECIMENS: Infected Synovial fluid and tissue for Gram stain and culture.  INDICATIONS FOR PROCEDURE: Patient is status post total knee arthroplasty Actuary in September of 2000. Over the last few years she is had loosening the tibial component that is down approximately 20 of varus and 20 apex anterior with obvious bone loss medially. The knee is buckling and giving out he's not had any trouble the wound or evidence of infection. In order to decrease pain and increase function she is taken for revision of any loose components and the total knee risks and benefits of surgery been discussed at length with the patient.  DESCRIPTION OF PROCEDURE: The patient identified by armband, and taken to the operating room, appropriate anesthetic  monitors were attached General endotracheal anesthesia induced with  the patient in supine position, Foley catheter was inserted. Tourniquet  applied high to the operative thigh. Lateral  post and foot positioner  applied to the table, the lower extremity was then prepped and draped  in usual sterile fashion from the ankle to the tourniquet. Time-out procedure was performed. We began the procedure with the tourniquet down. We began the operation by making the anterior midline incision starting at handbreadth above the patella going over the patella 1 cm medial to and 6 cm distal to the tibial tubercle, reproducing the old incision.  Small bleeders in the skin and the subcutaneous tissue identified and cauterized. A medial parapatellar arthrotomy was accomplished. We immediately encountered normal-appearing joint fluid and specimens were sent for Gram stain and culture. The patella was subluxed laterally and the superficial medial collateral ligament was then elevated from anterior to posterior along the proximal  flare of the tibia. We then set about sharply excising all inflamed synovium that was visible with the patella everted and the knee flexed. Polyethylene bearing was then removed sharply with an osteotome. With the knee hyperflexed and the patella everted we worked our way around the tibial component removing scar tissue and usually removed the grossly loose tibial component and the cement using osteotomes curettes and Rodgers. We then entered the proximal tibia with the Stryker pilot drill followed by the reamers up to 11 mm reamer to the appropriate depth for 155 mm stem. We then inserted the trial stem with the proximal tibial cutting guide in place and and upper removing about 8 mm of bone laterally and leaving a 77mm gap posterior medially. Fortunately after the cutter then accomplished there was bone posteriorly laterally and anteriorly as well as anteromedial giving [place] a fairly well-contained defect. We then  selected a #9 Stryker TS baseplate, pinned in place and performed the boss reaming. We then adjusted for an 8 mm offset is essentially anterolateral 230. The proximal tibial  base plate was then removed and a trial assembled with a #9 TS tibia 8 mm offset modular 230 an 11 x 1 55 trial stem. Before inserting the trial we used a power saw to cut recesses for the delta fin keel. And a trial was inserted. We then performed reductions with a 1012 and 15 mm bearings and had the best fit and stability with a 15 mm trial bearing. At this point we also dissected around the patella which was in good condition and not loose same with the femur we asked to put the slap hammer on the femoral component and it would not budge. At this point all bony surfaces were waterpicked clean and dried with suction and sponges at the back table we assembled a #9 TS tibial baseplate 8 mm offset 778 11 mm x 155 mm stem and a torqued to specifications. A double batch of DePuy HV cement was then mixed and applied to the bony surfaces of the proximal tibia into the tibial implant proximally to just above the 8 mm offset. The component was then hammered into place excess cement removed a 15 mm #9 Scorpio bearing was inserted the knee was reduced and held in extension as the cement cured. We then checked our stability which was excellent it 0 45 and 90 and the patella tracked without any thumb pressure. The wound is irrigated out normal saline solution pulse lavage one more time medium Hemovac drains were placed in the wound we then closed using #1 running Vicryl in the medial parapatellar arthrotomy 0 and 2-0 undyed Vicryl suture subcutaneously and staples in the skin. A dressing of Xerofoam 4 x 4 dressing sponges web roll and an Ace wrap was then applied followed by a small knee immobilizer the drains were placed to suction and the tourniquet let down. The patient was placed in a knee immobilizer.The patient was then awakened, extubated, and taken to recovery room without difficulty.  Frederik Pear J  10/26/2012, 2:57 PM

## 2013-06-19 NOTE — Transfer of Care (Signed)
Immediate Anesthesia Transfer of Care Note  Patient: Jennifer [name]  Procedure(s) Performed: Procedure(s): RIGHT TOTAL KNEE REVISION (Right)  Patient Location: PACU  Anesthesia Type:GA combined with regional for post-op pain  Level of Consciousness: awake, alert , oriented and patient cooperative  Airway & Oxygen Therapy: Patient Spontanous Breathing and Patient connected to nasal cannula oxygen  Post-op Assessment: Report given to PACU RN and Post -op Vital signs reviewed and stable  Post vital signs: Reviewed and stable  Complications: No apparent anesthesia complications

## 2013-06-19 NOTE — Progress Notes (Signed)
CRNA at bedside. Pt remains awake, VSS.

## 2013-06-19 NOTE — Anesthesia Procedure Notes (Signed)
Anesthesia Regional Block:  Adductor canal block  Pre-Anesthetic Checklist: ,, timeout performed, Correct Patient, Correct Site, Correct Laterality, Correct Procedure, Correct Position, site marked, Risks and benefits discussed,  Surgical consent,  Pre-op evaluation,  At surgeon's request and post-op pain management  Laterality: Right and Upper  Prep: chloraprep       Needles:   Needle Type: Echogenic Needle     Needle Length: 9cm 9 cm Needle Gauge: 22 and 22 G  Needle insertion depth: 9 cm   Additional Needles:  Procedures: ultrasound guided (picture in chart) Adductor canal block Narrative:  Start time: 06/19/2013 12:10 PM End time: 06/19/2013 12:36 PM Injection made incrementally with aspirations every 5 mL.  Performed by: Personally  Anesthesiologist: T Minta Fair  Additional Notes: Tolerated well

## 2013-06-19 NOTE — Anesthesia Postprocedure Evaluation (Signed)
Anesthesia Post Note  Patient: Jennifer [name]  Procedure(s) Performed: Procedure(s) (LRB): RIGHT TOTAL KNEE REVISION (Right)  Anesthesia type: General  Patient location: PACU  Post pain: Pain level controlled  Post assessment: Patient's Cardiovascular Status Stable  Last Vitals:  Filed Vitals:   06/19/13 1700  BP:   Pulse: 60  Temp:   Resp: 11    Post vital signs: Reviewed and stable  Level of consciousness: alert  Complications: No apparent anesthesia complications

## 2013-06-19 NOTE — Progress Notes (Signed)
Utilization review completed.  

## 2013-06-19 NOTE — Interval H&P Note (Signed)
History and Physical Interval Note:  06/19/2013 12:22 PM  Jennifer [name]  has presented today for surgery, with the diagnosis of FAILED TOTAL KNEE  The various methods of treatment have been discussed with the patient and family. After consideration of risks, benefits and other options for treatment, the patient has consented to  Procedure(s): RIGHT TOTAL KNEE REVISION (Right) as a surgical intervention .  The patient's history has been reviewed, patient examined, no change in status, stable for surgery.  I have reviewed the patient's chart and labs.  Questions were answered to the patient's satisfaction.     Kerin Salen

## 2013-06-20 LAB — CBC
HEMATOCRIT: 25.9 % — AB (ref 36.0–46.0)
Hemoglobin: 8.5 g/dL — ABNORMAL LOW (ref 12.0–15.0)
MCH: 31.7 pg (ref 26.0–34.0)
MCHC: 32.8 g/dL (ref 30.0–36.0)
MCV: 96.6 fL (ref 78.0–100.0)
Platelets: 175 10*3/uL (ref 150–400)
RBC: 2.68 MIL/uL — AB (ref 3.87–5.11)
RDW: 14 % (ref 11.5–15.5)
WBC: 5.4 10*3/uL (ref 4.0–10.5)

## 2013-06-20 NOTE — Clinical Social Work Psychosocial (Signed)
Clinical Social Work Department  BRIEF PSYCHOSOCIAL ASSESSMENT  Patient: Jennifer [name]  Account Number: [id number]   Admit date: 06/19/13 Clinical Social Worker Rhea Pink, MSW Date/Time: 06/20/2013 4:47 PM Referred by: Physician Date Referred:  Referred for   SNF Placement   Other Referral:  Interview type: Patient at bedside Other interview type: PSYCHOSOCIAL DATA  Living Status:Husband Admitted from facility:  Level of care:  Primary support name: Cockerham,Natasha  Primary support relationship to patient: Graddaughter Degree of support available:  Strong and vested  CURRENT CONCERNS  Current Concerns   Post-Acute Placement   Other Concerns:  SOCIAL WORK ASSESSMENT / PLAN  CSW met with pt at bedside to offer support and discuss SNF placement. Patient reported that seh and her son went to a facility recommended by the MD. When asked if it was [facility] patient reported yes. Patient stated, "Im freezing" and trying to warm up. CSW brought patient a hot cup of tea after clearing it with the nurse.  Patient was appreciative of support and information provided by CSW.  re: PT recommendation for SNF.   Pt lives alone  CSW explained placement process and answered questions.   Pt reports [facility]  as her preference    CSW completed FL2 and initiated SNF search.     Assessment/plan status: Information/Referral to [employer]  Other assessment/ plan:  Information/referral to community resources:  SNF   PTAR  PATIENT'S/FAMILY'S RESPONSE TO PLAN OF CARE:  Pt  reports she is agreeable to ST SNF in order to increase strength and independence with mobility prior to returning home  Pt verbalized understanding of placement process and appreciation for CSW assist.   Rhea Pink, MSW, [place]

## 2013-06-20 NOTE — Progress Notes (Signed)
Patient ID: Jennifer [name], female   DOB: 07-03-1935, 78 y.o.   MRN: [id number] PATIENT ID: Jennifer [name]  MRN: [id number]  DOB/AGE:  10-05-1935 / 78 y.o.  1 Day Post-Op Procedure(s) (LRB): RIGHT TOTAL KNEE REVISION (Right)    PROGRESS NOTE Subjective: Patient is alert, oriented, no Nausea, no Vomiting, yes passing gas, no Bowel Movement. Taking PO sips. Denies SOB, Chest or Calf Pain. Using Incentive Spirometer, PAS in place. Ambulate WBAT, No CPM  Patient reports pain as 2 on 0-10 scale  .    Objective: Vital signs in last 24 hours: Filed Vitals:   06/19/13 2049 06/20/13 0000 06/20/13 0057 06/20/13 0551  BP: 146/63  117/86 133/50  Pulse: 69  79 79  Temp: 98.5 F (36.9 C)  99.3 F (37.4 C) 100 F (37.8 C)  TempSrc:   Oral Oral  Resp: 16 16 16 16   Height:      Weight:      SpO2: 100% 100% 100% 98%      Intake/Output from previous day: I/O last 3 completed shifts: In: 1050 [P.O.:50; I.V.:1000] Out: 1450 [Urine:1150; Drains:150; Blood:150]   Intake/Output this shift: Total I/O In: -  Out: 800 [Urine:550; Drains:250]   LABORATORY DATA: No results found for this basename: WBC, HGB, HCT, PLT, NA, K, CL, CO2, BUN, CREATININE, GLUCOSE, GLUCAP, PT, INR, CALCIUM, 2,  in the last 72 hours  Examination: Neurologically intact ABD soft Neurovascular intact Sensation intact distally Intact pulses distally Dorsiflexion/Plantar flexion intact Incision: scant drainage No cellulitis present Compartment soft} XR well placed and fixed Rev RTKA with stems  Assessment:   1 Day Post-Op Procedure(s) (LRB): RIGHT TOTAL KNEE REVISION (Right) ADDITIONAL DIAGNOSIS:  Hypertension and hypothyroid, GERD  Plan: PT/OT WBAT, no CPM, OK for AROM, D/C knee Immobilizer DVT Prophylaxis:  SCDx72hrs, ASA 325 mg BID x 2 weeks DISCHARGE PLAN: Skilled Nursing Facility/Rehab, [facility] DISCHARGE NEEDS: HHPT, HHRN, CPM, Walker and 3-in-1 comode seat     Kerin Salen 06/20/2013, 6:27 AM

## 2013-06-20 NOTE — Progress Notes (Signed)
OT Cancellation Note/screen  Patient Details Name: Jennifer [name] MRN: [id number] DOB: December 11, 1935   Cancelled Treatment:    Reason Eval/Treat Not Completed: Other (comment)  Pt is Medicare/Medicaid and current D/C plan is SNF. No apparent immediate acute care OT needs, therefore will defer OT to SNF. If OT eval is needed please call Acute Rehab Dept. at [phone].  Per PT note, pt lives in a snf.    Lesle Chris 06/20/2013, 1:31 PM Lesle Chris, OTR/L [phone] 06/20/2013

## 2013-06-20 NOTE — Clinical Social Work Placement (Addendum)
Clinical Social Work Department  CLINICAL SOCIAL WORK PLACEMENT NOTE    Patient: Jennifer [name] Account Number: [id number]  Admit date: 06/19/13  Clinical Social Worker: Rhea Pink LCSWA Date/time: 06/20/2013 4:51 PM Clinical Social Work is seeking post-discharge placement for this patient at the following level of care: SKILLED NURSING (*CSW will update this form in Epic as items are completed)  5/7/2015Patient/family provided with [place] Department of Clinical Social Work's list of facilities offering this level of care within the geographic area requested by the patient (or if unable, by the patient's family).  06/20/2013 Patient/family informed of their freedom to choose among providers that offer the needed level of care, that participate in Medicare, Medicaid or managed care program needed by the patient, have an available bed and are willing to accept the patient.  06/20/2013 Patient/family informed of [facility]' ownership interest in [facility], as well as of the fact that they are under no obligation to receive care at this facility.  PASARR submitted to EDS on  PASARR number received from [place] on  FL2 transmitted to all facilities in geographic area requested by pt/family on 06/20/2013 FL2 transmitted to all facilities within larger geographic area on  Patient informed that his/her managed care company has contracts with or will negotiate with certain facilities, including the following:  Patient/family informed of bed offers received: 06/20/2013 Patient chooses bed at [facility] Physician recommends and patient chooses bed at  Patient to be transferred to [facility]  on 06/20/13 Patient [facility] be [facility]  The following physician request were entered in Epic:  Additional Comments:   06/23/13  DC to SNF 06/23/13 per OK of MD. Patient and family are agreeable and pleased with d/c Nursing notified to call report. No further CSW needs identified.  CSW signing of. Lorie Phenix. [place], [place]

## 2013-06-20 NOTE — Evaluation (Signed)
Physical Therapy Evaluation Patient Details Name: Jennifer [name] MRN: [id number] DOB: Nov 10, 1935 Today's Date: 06/20/2013   History of Present Illness  78 y.o. female s/p right total knee revision  Clinical Impression  Pt is s/p right total knee revision resulting in the deficits listed below (see PT Problem List). Unable to ambulate this AM secondary to pain but tolerated transfer training and therapeutic exercises. Pt will benefit from skilled PT to increase their independence and safety with mobility to allow discharge to the venue listed below.      Follow Up Recommendations SNF;Supervision/Assistance - 24 hour    Equipment Recommendations  Rolling walker with 5" wheels;3in1 (PT)    Recommendations for Other Services       Precautions / Restrictions Precautions Precautions: Knee Precaution Booklet Issued: Yes (comment) Precaution Comments: Reviewed handout Required Braces or Orthoses: Knee Immobilizer - Right Knee Immobilizer - Right: Other (comment) (Per MD note - d/c knee immobilizer) Restrictions Weight Bearing Restrictions: Yes RUE Weight Bearing: Weight bearing as tolerated      Mobility  Bed Mobility Overal bed mobility: Needs Assistance Bed Mobility: Supine to Sit     Supine to sit: Min assist;HOB elevated     General bed mobility comments: Min assist to scoot hips forward. Pt able to use rail to pull trunk into upright position. Min assist also for RLE support  Transfers Overall transfer level: Needs assistance Equipment used: Rolling walker (2 wheeled) Transfers: Sit to/from [facility] Sit to Stand: Min assist;+2 physical assistance Stand pivot transfers: Min assist       General transfer comment: Min assist for sit<>stand +2 for assistance with power-up to lift. Pt with good strength in LLE but needs cues for foot and hand placement throughout sit<>stand task. Min assist pivot transfer for RW control and sequencing. Cues for RLE knee  extension in WB with quad activation  Ambulation/Gait             General Gait Details: Pt unable to walk secondary to pain this AM  Stairs            Wheelchair Mobility    Modified Rankin (Stroke Patients Only)       Balance Overall balance assessment: Needs assistance Sitting-balance support: Bilateral upper extremity supported;Feet supported Sitting balance-Leahy Scale: Poor Sitting balance - Comments: needs assist due to posterior lean Postural control: Posterior lean Standing balance support: Bilateral upper extremity supported Standing balance-Leahy Scale: Poor Standing balance comment: RW for UE support                             Pertinent Vitals/Pain 10/10 pain Nurse notified and is aware Patient repositioned in chair for comfort.     Home Living Family/patient expects to be discharged to:: Skilled nursing facility Living Arrangements: Other (Comment) (lives at [facility]) Available Help at Discharge: [place] Type of Home: [place] Equipment: Gilford Rile - 4 wheels;Grab bars - toilet      Prior Function Level of Independence: Needs assistance   Gait / Transfers Assistance Needed: Used Rollator for ambulation  ADL's / Homemaking Assistance Needed: Needed assist with showering. could dress herself        Hand Dominance   Dominant Hand: Left    Extremity/Trunk Assessment   Upper Extremity Assessment: Overall WFL for tasks assessed           Lower Extremity Assessment: RLE  deficits/detail RLE Deficits / Details: Decreased strength and ROM       Communication   Communication: No difficulties  Cognition Arousal/Alertness: Lethargic Behavior During Therapy: WFL for tasks assessed/performed Overall Cognitive Status: Within Functional Limits for tasks assessed                      General Comments General comments (skin integrity, edema, etc.): Small amount of drainage near  surgical site on bandages. Educated on knee precautions and exercises to obtain strength and ROM    Exercises Total Joint Exercises Ankle Circles/Pumps: AROM;Both;15 reps;Supine Quad Sets: AROM;Right;15 reps;Supine Straight Leg Raises: AAROM;Both;5 reps;Seated      Assessment/Plan    PT Assessment Patient needs continued PT services  PT Diagnosis Difficulty walking;Abnormality of gait;Acute pain   PT Problem List Decreased strength;Decreased range of motion;Decreased activity tolerance;Decreased balance;Decreased mobility;Decreased knowledge of use of DME;Decreased knowledge of precautions;Pain  PT Treatment Interventions DME instruction;Gait training;Functional mobility training;Therapeutic activities;Therapeutic exercise;Balance training;Neuromuscular re-education;Modalities   PT Goals (Current goals can be found in the Care Plan section) Acute Rehab PT Goals Patient Stated Goal: Get better PT Goal Formulation: With patient Time For Goal Achievement: 06/27/13 Potential to Achieve Goals: Good    Frequency 7X/week   Barriers to discharge        Co-evaluation               End of Session Equipment Utilized During Treatment: Gait belt;Right knee immobilizer Activity Tolerance: Patient limited by pain Patient left: in chair;with call bell/phone within reach;with nursing/sitter in room Nurse Communication: Mobility status;Patient requests pain meds         Time: 1324-4010 PT Time Calculation (min): 36 min   Charges:   PT Evaluation $Initial PT Evaluation Tier I: 1 Procedure PT Treatments $Therapeutic Exercise: 8-22 mins $Therapeutic Activity: 8-22 mins   PT G Codes:        Elayne Snare, [place] 06/20/2013, 10:21 AM

## 2013-06-20 NOTE — Progress Notes (Signed)
06/20/13 PT recommended SNF. Referral made to CSW. CM will continue to follow until discharge.Fuller Plan RN, BSN, CCM

## 2013-06-21 ENCOUNTER — Encounter (HOSPITAL_COMMUNITY): Payer: Self-pay | Admitting: Orthopedic Surgery

## 2013-06-21 LAB — CBC
HEMATOCRIT: 23.3 % — AB (ref 36.0–46.0)
Hemoglobin: 7.7 g/dL — ABNORMAL LOW (ref 12.0–15.0)
MCH: 32.4 pg (ref 26.0–34.0)
MCHC: 33 g/dL (ref 30.0–36.0)
MCV: 97.9 fL (ref 78.0–100.0)
Platelets: 160 10*3/uL (ref 150–400)
RBC: 2.38 MIL/uL — ABNORMAL LOW (ref 3.87–5.11)
RDW: 14.1 % (ref 11.5–15.5)
WBC: 7.5 10*3/uL (ref 4.0–10.5)

## 2013-06-21 LAB — PREPARE RBC (CROSSMATCH)

## 2013-06-21 MED ORDER — METHOCARBAMOL 500 MG PO TABS: 500.0000 mg | ORAL_TABLET | Freq: Two times a day (BID) | ORAL | Status: DC

## 2013-06-21 MED ORDER — OXYCODONE-ACETAMINOPHEN 5-325 MG PO TABS: 1.0000 | ORAL_TABLET | ORAL | Status: DC | PRN

## 2013-06-21 MED ORDER — ASPIRIN EC 325 MG PO TBEC: 325.0000 mg | DELAYED_RELEASE_TABLET | Freq: Two times a day (BID) | ORAL | Status: DC

## 2013-06-21 NOTE — Progress Notes (Addendum)
PATIENT ID: Jennifer [name]  MRN: [id number]  DOB/AGE:  76-Jun-1937 / 76 y.o.  2 Days Post-Op Procedure(s) (LRB): RIGHT TOTAL KNEE REVISION (Right)    PROGRESS NOTE Subjective: Patient is alert, oriented, no Nausea, no Vomiting, yes passing gas, no Bowel Movement. Taking PO ok, sipping fluids. Denies SOB, Chest or Calf Pain. Using Incentive Spirometer, PAS in place. Ambulate WBAT, Patient reports pain as mild  .    Objective: Vital signs in last 24 hours: Filed Vitals:   06/20/13 2000 06/20/13 2050 06/21/13 0400 06/21/13 0537  BP:  129/51  126/48  Pulse:  78  84  Temp:  99.8 F (37.7 C)  100.4 F (38 C)  TempSrc:  Oral  Oral  Resp: 16 16 16 16   Height:      Weight:      SpO2: 94% 94% 96% 94%      Intake/Output from previous day: I/O last 3 completed shifts: In: 330 [P.O.:330] Out: 1950 [Urine:1700; Drains:250]   Intake/Output this shift:     LABORATORY DATA:  Recent Labs  06/20/13 0625 06/21/13 0656  WBC 5.4 7.5  HGB 8.5* 7.7*  HCT 25.9* 23.3*  PLT 175 160    Examination: Neurologically intact Neurovascular intact Sensation intact distally Intact pulses distally Dorsiflexion/Plantar flexion intact Incision: moderate drainage No cellulitis present Compartment soft}  Folley placed today as pt not urinating on own. Pt feels weak and HGB at 7.7 will transfuse 1 unit.  Assessment:   2 Days Post-Op Procedure(s) (LRB): RIGHT TOTAL KNEE REVISION (Right) ADDITIONAL DIAGNOSIS:  Hypertension and hypothyroid and gerd, acute post op anemia.  Plan: PT/OT WBAT, CPM 5/hrs day until ROM 0-90 degrees, then D/C CPM DVT Prophylaxis:  SCDx72hrs, ASA 325 mg BID x 2 weeks DISCHARGE PLAN: Skilled Nursing Facility/Rehab DISCHARGE NEEDS: HHPT, [place], Walker and 3-in-1 comode seat     Jennifer [name] 06/21/2013, 8:09 AM

## 2013-06-21 NOTE — Discharge Summary (Addendum)
Patient ID: Jennifer [name] MRN: [id number] DOB/AGE: 20-Feb-1935 78 y.o.  Admit date: 06/19/2013 Discharge date: 06/23/13  Admission Diagnoses:  Principal Problem:   Right Mechanical complication of knee prosthesis Active Problems:   Failure of total knee arthroplasty   Discharge Diagnoses:  Same  Past Medical History  Diagnosis Date  . Dementia   . Arthritis   . Blood transfusion   . Hypertension   . Psychosis   . Cardiomyopathy, nonischemic     EF initially 30% but improved with medical therapy, EF now 50-55%.  [name] LBBB (left bundle branch block)   . Paget's disease of bone 11/29/2012  . 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 Anginal pain     pt has chest pain that comes and goes Dr. Claiborne Billings heart Dr is aware  . Seizures     approximately 20 years since last seizure  . Frequency of urination     at night  . Diarrhea   . Constipation due to pain medication   . GERD (gastroesophageal reflux disease)   . Anemia   . Swelling of both ankles     Takes Lasix if needed  . Glaucoma   . Depression   . Anxiety   . TIA (transient ischemic attack)     Surgeries: Procedure(s): RIGHT TOTAL KNEE REVISION on 06/19/2013   Consultants:    Discharged Condition: Improved  Hospital Course: Jennifer [name] is an 78 y.o. female who was admitted 06/19/2013 for operative treatment ofMechanical complication of knee prosthesis. Patient has severe unremitting pain that affects sleep, daily activities, and work/hobbies. After pre-op clearance the patient was taken to the operating room on 06/19/2013 and underwent  Procedure(s): RIGHT TOTAL KNEE REVISION.    Patient was given perioperative antibiotics: Anti-infectives   Start     Dose/Rate Route Frequency Ordered Stop   06/19/13 0600  ceFAZolin (ANCEF) IVPB 2 g/50 mL premix     2 g 100 mL/hr over 30 Minutes Intravenous On call to O.R. 06/18/13 1359 06/19/13 1307       Patient was given  sequential compression devices, early ambulation, and chemoprophylaxis to prevent DVT.  Patient benefited maximally from hospital stay and there were no complications.    Recent vital signs: Patient Vitals for the past 24 hrs:  BP Temp Temp src Pulse Resp SpO2  06/21/13 1200 130/73 mmHg 99.2 F (37.3 C) Oral 82 18 95 %  06/21/13 1138 125/54 mmHg 99.3 F (37.4 C) Oral 77 16 95 %  06/21/13 1027 106/43 mmHg 99.1 F (37.3 C) Oral 71 16 95 %  06/21/13 0537 126/48 mmHg 100.4 F (38 C) Oral 84 16 94 %  06/21/13 0400 - - - - 16 96 %  06/20/13 2050 129/51 mmHg 99.8 F (37.7 C) Oral 78 16 94 %  06/20/13 2000 - - - - 16 94 %     Recent laboratory studies:  Recent Labs  06/20/13 0625 06/21/13 0656  WBC 5.4 7.5  HGB 8.5* 7.7*  HCT 25.9* 23.3*  PLT 175 160     Discharge Medications:     Medication List         ARIPiprazole 5 MG tablet  Commonly known as:  ABILIFY  Take 5 mg by mouth every morning.     aspirin EC 325 MG tablet  Take 1 tablet (325 mg total) by mouth 2 (two) times daily.     buPROPion  150 MG 24 hr tablet  Commonly known as:  WELLBUTRIN XL  Take 150 mg by mouth every morning.     carvedilol 12.5 MG tablet  Commonly known as:  COREG  Take 12.5 mg by mouth 2 (two) times daily with a meal.     diclofenac sodium 1 % Gel  Commonly known as:  VOLTAREN  Apply 2 g topically 4 (four) times daily. Rub into affected area of foot 2 to 4 times daily     fentaNYL 25 MCG/HR patch  Commonly known as:  DURAGESIC - dosed mcg/hr  Place 25 mcg onto the skin every 3 (three) days.     ferrous sulfate 325 (65 FE) MG tablet  Take 325 mg by mouth 3 (three) times a week. On Mon, Wed, and Fri.     furosemide 20 MG tablet  Commonly known as:  LASIX  Take 20 mg by mouth daily as needed (Takes one tablet daily if needed for leg swelling).     HYDROcodone-acetaminophen 5-325 MG per tablet  Commonly known as:  NORCO/VICODIN  Take 1 tablet by mouth 2 (two) times daily as needed for  moderate pain.     latanoprost 0.005 % ophthalmic solution  Commonly known as:  XALATAN  Place 1 drop into both eyes at bedtime.     lisinopril 10 MG tablet  Commonly known as:  PRINIVIL,ZESTRIL  Take 10 mg by mouth every morning.     loperamide 2 MG tablet  Commonly known as:  IMODIUM A-D  Take 2 mg by mouth 4 (four) times daily as needed for diarrhea or loose stools (Take 1 caplet by mouth every 2 hours as needed for losse stool).     LORazepam 0.5 MG tablet  Commonly known as:  ATIVAN  Take 0.5-1 mg by mouth 2 (two) times daily. Take 1 tablet in the morning and 2 tablets at bedtime     methocarbamol 500 MG tablet  Commonly known as:  ROBAXIN  Take 1 tablet (500 mg total) by mouth 2 (two) times daily with a meal.     multivitamin with minerals Tabs tablet  Take 1 tablet by mouth every morning.     oxyCODONE-acetaminophen 5-325 MG per tablet  Commonly known as:  ROXICET  Take 1 tablet by mouth every 4 (four) hours as needed.     pantoprazole 40 MG tablet  Commonly known as:  PROTONIX  Take 40 mg by mouth daily.     psyllium 0.52 G capsule  Commonly known as:  REGULOID  Take 0.52 g by mouth at bedtime.     risperiDONE 0.5 MG tablet  Commonly known as:  RISPERDAL  Take 0.25-0.5 mg by mouth at bedtime. Take 0.5 mg daily at bedtime for 1 month then start 0.25 mg tablet daily at bedtime - Starting on 05-24-13     temazepam 15 MG capsule  Commonly known as:  RESTORIL  Take 15 mg by mouth at bedtime.     vitamin B-12 50 MCG tablet  Commonly known as:  CYANOCOBALAMIN  Take 50 mcg by mouth daily.        Diagnostic Studies: Dg Knee Right Port  06/19/2013   CLINICAL DATA:  Postop right knee.  EXAM: PORTABLE RIGHT KNEE - 1-2 VIEW  FINDINGS: Patient status post total right knee replacement with good anatomic alignment. No acute abnormality identified. Postsurgical changes are present.  IMPRESSION: Patient status post total right knee replacement with good anatomic alignment.    Electronically Signed  By: [place]   On: 06/19/2013 17:11    Disposition: [facility] Term Care       Future Appointments Provider Department Dept Phone   08/22/2013 3:30 PM Trudie Buckler, [employer] [facility] [place]      Follow-up Information   Follow up with Kerin Salen, MD In 2 weeks.   Specialty:  Orthopedic Surgery   Contact information:   [place] [address] [phone]        Signed: [name] 06/21/2013, 1:37 PM

## 2013-06-21 NOTE — Progress Notes (Signed)
Physical Therapy Treatment Patient Details Name: LESLY PONTARELLI MRN: [id number] DOB: 1935-06-02 Today's Date: 06/21/2013    History of Present Illness 78 y.o. female s/p right total knee revision    PT Comments    Pt progressing slowly towards physical therapy goals. Required more assistance with sit<>stand transfer today however improved her ability to transfer to chair. Therapy focused on therapeutic exercises for strength and ROM. Pt will benefit from continued skilled PT in order to further improve functional independence and decrease burden of care at next venue of care.   Follow Up Recommendations  SNF;Supervision/Assistance - 24 hour     Equipment Recommendations  Rolling walker with 5" wheels;3in1 (PT)    Recommendations for Other Services       Precautions / Restrictions Precautions Precautions: Knee Precaution Booklet Issued: Yes (comment) Precaution Comments: Reviewed handout Required Braces or Orthoses: Knee Immobilizer - Right Knee Immobilizer - Right: Other (comment) (Per MD note - d/c knee immobilizer) Restrictions Weight Bearing Restrictions: Yes RUE Weight Bearing: Weight bearing as tolerated    Mobility  Bed Mobility Overal bed mobility: Needs Assistance Bed Mobility: Supine to Sit     Supine to sit: Min assist;HOB elevated     General bed mobility comments: Min assist to scoot hips forward. Pt able to use rail to pull trunk into upright position. Min assist also for RLE and LLE to off of bede  Transfers Overall transfer level: Needs assistance Equipment used: Rolling walker (2 wheeled) Transfers: Sit to/from [facility] Sit to Stand: Mod assist Stand pivot transfers: Min assist       General transfer comment: Mod assist today with sit<>stand for power-up to lift. Delayed initiation with LLE extension and max verbal cues to bring RLE underneath her base of support once standing. Cues for foot and hand placement throughout  sit<>stand task. Min assist pivot transfer for RW control and sequencing. Cues for RLE knee extension in WB with quad activation. Physically blocked RLE to prevent buckling but pt appears to have better control today. Able to make small steps during pivot and is using her UEs more this AM for support.  Ambulation/Gait                 Stairs            Wheelchair Mobility    Modified Rankin (Stroke Patients Only)       Balance                                    Cognition Arousal/Alertness: Lethargic Behavior During Therapy: WFL for tasks assessed/performed Overall Cognitive Status: Within Functional Limits for tasks assessed                      Exercises Total Joint Exercises Ankle Circles/Pumps: AROM;Both;Supine;10 reps Quad Sets: AROM;Right;Supine;10 reps Short Arc QuadSinclair Ship;Right;10 reps;Supine Hip ABduction/ADduction: AAROM;Right;10 reps;Supine    General Comments General comments (skin integrity, edema, etc.): Reviewed knee precautions and exercises. Pt was hesitant to work with therapy this AM and was educated on importance of OOB mobility to prevent secondary complications and to further improve independence and strength.      Pertinent Vitals/Pain 10/10 pain - however, pt states she feels better than yesterday Nurse notified Patient repositioned in chair for comfort.     Home Living  Prior Function            PT Goals (current goals can now be found in the care plan section) Acute Rehab PT Goals Patient Stated Goal: Get better PT Goal Formulation: With patient Time For Goal Achievement: 06/27/13 Potential to Achieve Goals: Good Progress towards PT goals: Progressing toward goals    Frequency  7X/week    PT Plan Current plan remains appropriate    Co-evaluation             End of Session Equipment Utilized During Treatment: Gait belt;Right knee immobilizer Activity Tolerance:  Patient limited by pain Patient left: in chair;with call bell/phone within reach     Time: 0838-0907 PT Time Calculation (min): 29 min  Charges:  $Therapeutic Exercise: 8-22 mins $Therapeutic Activity: 8-22 mins                    G CodesCamille Bal [place], [place]  [name] 06/21/2013, 9:37 AM

## 2013-06-22 LAB — BODY FLUID CULTURE
Culture: NO GROWTH
Gram Stain: NONE SEEN

## 2013-06-22 LAB — CBC
HCT: 22.7 % — ABNORMAL LOW (ref 36.0–46.0)
Hemoglobin: 7.6 g/dL — ABNORMAL LOW (ref 12.0–15.0)
MCH: 32.5 pg (ref 26.0–34.0)
MCHC: 33.5 g/dL (ref 30.0–36.0)
MCV: 97 fL (ref 78.0–100.0)
PLATELETS: 142 10*3/uL — AB (ref 150–400)
RBC: 2.34 MIL/uL — ABNORMAL LOW (ref 3.87–5.11)
RDW: 15.7 % — ABNORMAL HIGH (ref 11.5–15.5)
WBC: 6.9 10*3/uL (ref 4.0–10.5)

## 2013-06-22 LAB — PREPARE RBC (CROSSMATCH)

## 2013-06-22 NOTE — Progress Notes (Addendum)
CSW (Clinical Social Worker) left message for facility to confirm pt is okay to dc. Will await return phone call and then help arrange discharge.   ADDENDUM: Per nursing, pt not ready for dc today. CSW called and notified facility. CSW will continue to follow and help with discharge when pt is medically stable.  Jennifer [name], Jennifer [name]

## 2013-06-22 NOTE — Progress Notes (Signed)
Physical Therapy Treatment Patient Details Name: Jennifer [name] MRN: [id number] DOB: 1935/05/11 Today's Date: 06/22/2013    History of Present Illness 78 y.o. female s/p right total knee revision    PT Comments    Pt therapy session limited by pain and confusion this afternoon. Obvious change in mentation from previous therapy sessions as pt is not oriented to time, place, or situation. Her mobility however continues to improve but due to agitation and pain refuses to ambulate with therapy. PT session focused on safety with bed mobility, transfers, and therapeutic exercises. Nurse notified of change in mental status.   Follow Up Recommendations  SNF;Supervision/Assistance - 24 hour     Equipment Recommendations  Rolling walker with 5" wheels;3in1 (PT)    Recommendations for Other Services       Precautions / Restrictions Precautions Precautions: Knee Precaution Booklet Issued: Yes (comment) Precaution Comments: Reviewed handout Required Braces or Orthoses: Knee Immobilizer - Right Knee Immobilizer - Right: Other (comment) (Per MD note - d/c knee immobilizer) Restrictions Weight Bearing Restrictions: Yes RUE Weight Bearing: Weight bearing as tolerated    Mobility  Bed Mobility Overal bed mobility: Needs Assistance Bed Mobility: Supine to Sit     Supine to sit: Min assist;HOB elevated     General bed mobility comments: Pt with improved bed mobility today, able to scoot hips forward. Still requires min assist for RLE off of bed and trunk support into sit position  Transfers Overall transfer level: Needs assistance Equipment used: Rolling walker (2 wheeled) Transfers: Sit to/from Stand Sit to Stand: Mod assist Stand pivot transfers: Min assist       General transfer comment: Continues to require mod assist with sit<>stand for power-up to lift. Delayed initiation with LLE extension and min verbal cues to bring RLE underneath her base of support once standing. Cues  for hand placement with sit<>stand. Min assist pivot transfer for RW control and sequencing. Verbal and tactile cues for RLE knee extension in WB with quad activation. Mild right knee instability with WB.  Able to make small steps during pivot but refuses to ambulate with PT as it is too painful.  Ambulation/Gait             General Gait Details: Believe pt is physically capable of ambulating however states she is in too much pain to ambulate several feet with PT.   Stairs            Wheelchair Mobility    Modified Rankin (Stroke Patients Only)       Balance                                    Cognition Arousal/Alertness: Awake/alert Behavior During Therapy: Agitated Overall Cognitive Status: Impaired/Different from baseline Area of Impairment: Orientation;Memory;Following commands;Problem solving Orientation Level: Disoriented to;Place;Time;Situation   Memory: Decreased short-term memory Following Commands: Follows one step commands inconsistently     Problem Solving: Slow processing;Decreased initiation;Difficulty sequencing;Requires verbal cues;Requires tactile cues General Comments: Pt with significant change in mentation today as she is confused and agitated. Reports she is at her nephews house and wishes therapist would let her go home. Becomes angry with therapist when asking questions concerning orientation and does not believe that she is in the hospital.    Exercises Total Joint Exercises Ankle Circles/Pumps: AROM;Both;Supine;10 reps Quad Sets: AROM;Right;Supine;10 reps Short Arc QuadSinclair Ship;Right;10 reps;Supine    General Comments General comments (skin integrity,  edema, etc.): Education for use of call bell when pt needs assistance. Reviewed exercise program.       Pertinent Vitals/Pain 10/10 pain Nurse notified Patient repositioned in chair for comfort.     Home Living                      Prior Function             PT Goals (current goals can now be found in the care plan section) Acute Rehab PT Goals Patient Stated Goal: Get better PT Goal Formulation: With patient Time For Goal Achievement: 06/27/13 Potential to Achieve Goals: Good Progress towards PT goals: Progressing toward goals    Frequency  7X/week    PT Plan Current plan remains appropriate    Co-evaluation             End of Session Equipment Utilized During Treatment: Gait belt;Right knee immobilizer Activity Tolerance: Patient limited by pain Patient left: in chair;with call bell/phone within reach     Time: 1457-1523 PT Time Calculation (min): 26 min  Charges:  $Therapeutic Activity: 8-22 mins $Self Care/Home Management: 8-22                    G Codes:       Elayne Snare, [place] 06/22/2013, 3:44 PM

## 2013-06-22 NOTE — Progress Notes (Signed)
[id number] prbc started at 1030, pt in bed, iv site patent, skin eveluated, teaching done with patient for s/s transfusion reaction.

## 2013-06-22 NOTE — Progress Notes (Signed)
Transfusion complete no s/s of adverse reactions noted.

## 2013-06-22 NOTE — Progress Notes (Signed)
PATIENT ID: Jennifer [name] MRN: [id number] DOB/AGE: [birth year] / 76 y.o.   3 Days Post-Op Procedure(s) (LRB):  RIGHT TOTAL KNEE REVISION (Right)   PROGRESS NOTE   Subjective:  Patient is alert, oriented, however somewhat disrupted, repetitively asks for family and for home walker. no Nausea, no Vomiting, yes passing gas, yes Bowel Movement. Taking PO ok, sipping fluids.  Denies SOB, Chest or Calf Pain. Using Incentive Spirometer, PAS in place.  Ambulate WBAT,  Patient reports pain as mild . Objective:  BP 121/44  Pulse 78  Temp(Src) 99.7 F (37.6 C) (Oral)  Resp 16  Ht 5\' 2"  (1.575 m)  Wt 80.74 kg (178 lb)  BMI 32.55 kg/m2  SpO2 96%   Intake/Output Summary (Last 24 hours) at 06/22/13 0849 Last data filed at 06/22/13 0645  Gross per 24 hour  Intake   2300 ml  Output   3150 ml  Net   -850 ml   LABORATORY DATA:   CBC    Component Value Date/Time   WBC 6.9 06/22/2013 0518   RBC 2.34* 06/22/2013 0518   RBC 3.22* 11/28/2012 1038   HGB 7.6* 06/22/2013 0518   HCT 22.7* 06/22/2013 0518   PLT 142* 06/22/2013 0518   MCV 97.0 06/22/2013 0518   MCH 32.5 06/22/2013 0518   MCHC 33.5 06/22/2013 0518   RDW 15.7* 06/22/2013 0518   LYMPHSABS 2.1 06/11/2013 1130   MONOABS 0.4 06/11/2013 1130   EOSABS 0.1 06/11/2013 1130   BASOSABS 0.1 06/11/2013 1130   Examination:  Neurologically intact  Neurovascular intact  Sensation intact distally  Intact pulses distally  Dorsiflexion/Plantar flexion intact  Incision: CDI No cellulitis present  Compartment soft    Assessment:  3 Days Post-Op Procedure(s) (LRB):  RIGHT TOTAL KNEE REVISION (Right)  ADDITIONAL DIAGNOSIS: Hypertension and hypothyroid and gerd, acute post op anemia.  Acute post op anemia, transfuse additional unit PRBC's   Plan:  PT/OT WBAT, CPM 5/hrs day until ROM 0-90 degrees, then D/C CPM  DVT Prophylaxis: SCDx72hrs, ASA 325 mg BID x 2 weeks  Transfuse additional unit PRBC's  DISCHARGE PLAN: Skilled Nursing Facility/Rehab  DISCHARGE  NEEDS: HHPT, HHRN, Walker and 3-in-1 comode seat

## 2013-06-22 NOTE — Progress Notes (Signed)
PT alert and oriented with periods of confusion. Bed alarm is on. Fluids infusing. Pt assisted to the [facility]. Weak, followed directions well. Pain is in left knee. Placed a new durgesic patch to left shoulder. callbell and phone in reach. Will monitor closely

## 2013-06-22 NOTE — Progress Notes (Signed)
PT Cancellation Note  Patient Details Name: REMINGTYN DEPAOLA MRN: [id number] DOB: 1935-05-11   Cancelled Treatment:    Reason Eval/Treat Not Completed: Patient not medically ready  Pt Hgb 7.6 g/dL. prbc started at 1030 per nurse note. Will hold therapy at this time and follow up this afternoon.  [place], [place]   [name] 06/22/2013, 10:50 AM

## 2013-06-22 NOTE — Progress Notes (Signed)
Patient continues to be asymptomatic with blood transfusion at 125 cc/hr to patent 20g right wrist cathalon.

## 2013-06-23 LAB — TYPE AND SCREEN
ABO/RH(D): O POS
Antibody Screen: NEGATIVE
Unit division: 0
Unit division: 0

## 2013-06-23 NOTE — Progress Notes (Signed)
Physical Therapy Treatment Patient Details Name: Jennifer [name] MRN: [id number] DOB: 1935/07/29 Today's Date: 06/23/2013    History of Present Illness 78 y.o. female s/p right total knee revision    PT Comments    Pt progressing towards physical therapy goals, much improved compared to yesterday. Able to ambulate up to 15 feet with min assist. Pt will greatly benefit from continued skilled PT services in SNF to further improve her independence with functional mobility.   Follow Up Recommendations  SNF;Supervision/Assistance - 24 hour     Equipment Recommendations  Rolling walker with 5" wheels;3in1 (PT)    Recommendations for Other Services       Precautions / Restrictions Precautions Precautions: Knee Knee Immobilizer - Right: Other (comment) (Per MD note - d/c knee immobilizer) Restrictions Weight Bearing Restrictions: Yes RUE Weight Bearing: Weight bearing as tolerated    Mobility  Bed Mobility               General bed mobility comments: Pt in chair at start and end of therapy session  Transfers Overall transfer level: Needs assistance Equipment used: Rolling walker (2 wheeled) Transfers: Sit to/from Stand Sit to Stand: Min assist         General transfer comment: Min assist for sit<>stand for power-up lift from recliner. Verbal cues for hand and foot placement.  Ambulation/Gait Ambulation/Gait assistance: Min assist Ambulation Distance (Feet): 15 Feet Assistive device: Rolling walker (2 wheeled) Gait Pattern/deviations: Step-to pattern;Decreased step length - left;Decreased stance time - right;Antalgic;Trunk flexed Gait velocity: decreased   General Gait Details: Min assist for RW control. Verbal cues for sequencing and RW positioning. Tactile/verbal cues for upright posture. Pt flexed at trunk and needs frequent cues to correct this. Mild instability of R knee with knee immobilizer in place but no significant buckling noted.   Stairs             Wheelchair Mobility    Modified Rankin (Stroke Patients Only)       Balance                                    Cognition Arousal/Alertness: Awake/alert Behavior During Therapy: WFL for tasks assessed/performed Overall Cognitive Status: Within Functional Limits for tasks assessed                 General Comments: [name]    Exercises      General Comments General comments (skin integrity, edema, etc.): Pt with improved mentation today. A&O x4 - did not seeing PT yesterday.      Pertinent Vitals/Pain 8/10 pain Nurse in room when PT session ended Patient repositioned in chair for comfort.     Home Living                      Prior Function            PT Goals (current goals can now be found in the care plan section) Acute Rehab PT Goals Patient Stated Goal: Get better PT Goal Formulation: With patient Time For Goal Achievement: 06/27/13 Potential to Achieve Goals: Good Progress towards PT goals: Progressing toward goals    Frequency  7X/week    PT Plan Current plan remains appropriate    Co-evaluation             End of Session Equipment Utilized During Treatment: Gait belt;Right knee immobilizer Activity Tolerance: Patient tolerated treatment well  Patient left: in chair;with call bell/phone within reach;with nursing/sitter in room     Time: 1215-1225 PT Time Calculation (min): 10 min  Charges:  $Gait Training: 8-22 mins                    G Codes:      Jennifer [name] Jennifer [name], Jennifer  Ellouise [name] 06/23/2013, 1:12 PM

## 2013-06-23 NOTE — Progress Notes (Signed)
Pt old pain patch removed and wasted with Rande Lawman RN, at 2150. New patch placed on left shoulder. See MAR, administration did not scan

## 2013-06-23 NOTE — Progress Notes (Signed)
PATIENT ID: AMIT MELOY MRN: [id number] DOB/AGE: 22-Feb-1935 / 78 y.o.  4 Days Post-Op Procedure(s) (LRB):  RIGHT TOTAL KNEE REVISION (Right)  PROGRESS NOTE   Subjective:  Patient is alert, oriented, however sleepy, awoke for exam. No Nausea, no Vomiting, yes passing gas, yes Bowel Movement. Taking PO ok, denies SOB, dizziness, fatigue Denies SOB, Chest or Calf Pain. Using Incentive Spirometer, PAS in place.  Ambulate WBAT,  Patient reports pain as mild  .  Objective:  BP 143/52  Pulse 74  Temp(Src) 98.3 F (36.8 C) (Oral)  Resp 18  Ht 5\' 2"  (1.575 m)  Wt 80.74 kg (178 lb)  BMI 32.55 kg/m2  SpO2 97%   Intake/Output Summary (Last 24 hours) at 06/23/13 0957 Last data filed at 06/23/13 0800  Gross per 24 hour  Intake 3909.17 ml  Output      0 ml  Net 3909.17 ml     LABORATORY DATA:  CBC    Component Value Date/Time   WBC 6.9 06/22/2013 0518   RBC 2.34* 06/22/2013 0518   RBC 3.22* 11/28/2012 1038   HGB 7.6* 06/22/2013 0518   HCT 22.7* 06/22/2013 0518   PLT 142* 06/22/2013 0518   MCV 97.0 06/22/2013 0518   MCH 32.5 06/22/2013 0518   MCHC 33.5 06/22/2013 0518   RDW 15.7* 06/22/2013 0518   LYMPHSABS 2.1 06/11/2013 1130   MONOABS 0.4 06/11/2013 1130   EOSABS 0.1 06/11/2013 1130   BASOSABS 0.1 06/11/2013 1130    Examination:  Neurologically intact  Neurovascular intact  Sensation intact distally  Intact pulses distally  Dorsiflexion/Plantar flexion intact  Incision: CDI  No cellulitis present  Compartment soft  Assessment:  4 Days Post-Op Procedure(s) (LRB):  RIGHT TOTAL KNEE REVISION (Right)  ADDITIONAL DIAGNOSIS: Hypertension and hypothyroid and gerd, acute post op anemia.  Acute post op anemia, transfuse additional unit PRBC's   Plan:  PT/OT WBAT DVT Prophylaxis: SCDx72hrs, ASA 325 mg BID x 2 weeks  DISCHARGE PLAN: Skilled Nursing Facility/Rehab  DISCHARGE NEEDS: HHPT, HHRN, Walker and 3-in-1 comode seat

## 2013-06-24 ENCOUNTER — Encounter: Payer: Self-pay | Admitting: *Deleted

## 2013-06-24 ENCOUNTER — Non-Acute Institutional Stay (SKILLED_NURSING_FACILITY): Payer: Medicare Other | Admitting: Adult Health

## 2013-06-24 DIAGNOSIS — T84099A Other mechanical complication of unspecified internal joint prosthesis, initial encounter: Secondary | ICD-10-CM

## 2013-06-24 DIAGNOSIS — F329 Major depressive disorder, single episode, unspecified: Secondary | ICD-10-CM

## 2013-06-24 DIAGNOSIS — F32A Depression, unspecified: Secondary | ICD-10-CM

## 2013-06-24 DIAGNOSIS — F3289 Other specified depressive episodes: Secondary | ICD-10-CM

## 2013-06-24 DIAGNOSIS — D62 Acute posthemorrhagic anemia: Secondary | ICD-10-CM

## 2013-06-24 DIAGNOSIS — K219 Gastro-esophageal reflux disease without esophagitis: Secondary | ICD-10-CM

## 2013-06-24 DIAGNOSIS — Z96659 Presence of unspecified artificial knee joint: Secondary | ICD-10-CM

## 2013-06-24 DIAGNOSIS — F411 Generalized anxiety disorder: Secondary | ICD-10-CM

## 2013-06-24 DIAGNOSIS — K5900 Constipation, unspecified: Secondary | ICD-10-CM

## 2013-06-24 DIAGNOSIS — T84098A Other mechanical complication of other internal joint prosthesis, initial encounter: Secondary | ICD-10-CM

## 2013-06-24 DIAGNOSIS — G4700 Insomnia, unspecified: Secondary | ICD-10-CM

## 2013-06-24 DIAGNOSIS — M25569 Pain in unspecified knee: Secondary | ICD-10-CM

## 2013-06-24 DIAGNOSIS — I10 Essential (primary) hypertension: Secondary | ICD-10-CM

## 2013-06-24 LAB — ANAEROBIC CULTURE: GRAM STAIN: NONE SEEN

## 2013-06-25 ENCOUNTER — Non-Acute Institutional Stay (SKILLED_NURSING_FACILITY): Payer: Medicare Other | Admitting: Internal Medicine

## 2013-06-25 DIAGNOSIS — I428 Other cardiomyopathies: Secondary | ICD-10-CM

## 2013-06-25 DIAGNOSIS — D62 Acute posthemorrhagic anemia: Secondary | ICD-10-CM

## 2013-06-25 DIAGNOSIS — F0390 Unspecified dementia without behavioral disturbance: Secondary | ICD-10-CM

## 2013-06-25 DIAGNOSIS — M25569 Pain in unspecified knee: Secondary | ICD-10-CM

## 2013-06-26 ENCOUNTER — Other Ambulatory Visit: Payer: Self-pay | Admitting: *Deleted

## 2013-06-26 MED ORDER — FENTANYL 50 MCG/HR TD PT72: MEDICATED_PATCH | TRANSDERMAL | Status: DC

## 2013-06-26 NOTE — Telephone Encounter (Signed)
[facility] 

## 2013-06-28 DIAGNOSIS — M25569 Pain in unspecified knee: Secondary | ICD-10-CM | POA: Insufficient documentation

## 2013-06-28 DIAGNOSIS — D62 Acute posthemorrhagic anemia: Secondary | ICD-10-CM | POA: Insufficient documentation

## 2013-06-28 DIAGNOSIS — F0390 Unspecified dementia without behavioral disturbance: Secondary | ICD-10-CM | POA: Insufficient documentation

## 2013-06-28 NOTE — Progress Notes (Signed)
HISTORY & PHYSICAL  DATE: 06/25/2013   FACILITY: [facility]  LEVEL OF CARE: SNF (31)  ALLERGIES:  Allergies  Allergen Reactions  . Codeine Other (See Comments)    Makes her sick on her stomach.    CHIEF COMPLAINT:  Manage right knee pain, acute blood loss anemia and cardiomyopathy  HISTORY OF PRESENT ILLNESS: Patient is a 78 year old African American female.  KNEE OSTEOARTHRITIS: Patient had a history of pain and functional disability in the knee due to failure of right total knee arthroplasty and has failed nonsurgical conservative treatments. Patient had worsening of pain with activity and weight bearing, pain that interfered with activities of daily living & pain with passive range of motion. Therefore patient underwent right  total knee revision and tolerated the procedure well. Patient is admitted to this facility for sort short-term rehabilitation. Patient complains of right knee pain.  ANEMIA: The anemia has been stable. The patient denies fatigue, melena or hematochezia. No complications from the medications currently being used. Postoperatively patient suffered acute blood loss anemia. Last hemoglobin of 7.7 and 8.5.  CARDIOMYOPATHY: The patient's cardiomyopathy remains stable. Patient denies increasing lower extremity swelling, shortness of breath, chest pain, palpitations, orthopnea or PNDs. No complications reported from the medications currently being used.  PAST MEDICAL HISTORY :  Past Medical History  Diagnosis Date  . Dementia   . Arthritis   . Blood transfusion   . Hypertension   . Psychosis   . Cardiomyopathy, nonischemic     EF initially 30% but improved with medical therapy, EF now 50-55%.  [name] LBBB (left bundle branch block)   . Paget's disease of bone 11/29/2012  . 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 Anginal pain     pt has chest pain that comes and goes Dr. Claiborne Billings  heart Dr is aware  . Seizures     approximately 20 years since last seizure  . Frequency of urination     at night  . Diarrhea   . Constipation due to pain medication   . GERD (gastroesophageal reflux disease)   . Anemia   . Swelling of both ankles     Takes Lasix if needed  . Glaucoma   . Depression   . Anxiety   . TIA (transient ischemic attack)   . Thyroid disease 04/13/2006    hypothyroidism  . Osteoporosis 04/13/2006    PAST SURGICAL HISTORY: Past Surgical History  Procedure Laterality Date  . Cholecystectomy    . Esophagogastroduodenoscopy N/A 11/30/2012    Procedure: ESOPHAGOGASTRODUODENOSCOPY (EGD);  Surgeon: Wonda Horner, MD;  Location: [facility] ENDOSCOPY;  Service: Endoscopy;  Laterality: N/A;  . Tubal ligation    . Joint replacement Bilateral     knees  . Tonsillectomy    . Cardiac catheterization    . Foot surgery Left   . Breast lumpectomy Left     x 2  . Shoulder surgery Right   . Eye surgery      cataract removal pt unsure which eye  . Total knee revision Right 06/19/2013    Procedure: RIGHT TOTAL KNEE REVISION;  Surgeon: Kerin Salen, MD;  Location: [place];  Service: Orthopedics;  Laterality: Right;    SOCIAL HISTORY:  reports that she has never smoked. Her smokeless tobacco use includes Snuff. She reports that she does not drink alcohol or use illicit drugs.  FAMILY HISTORY: None  CURRENT MEDICATIONS: Reviewed per MAR/see medication list  REVIEW OF SYSTEMS:  See HPI otherwise 14 point ROS is negative.  PHYSICAL EXAMINATION  VS:  See VS section  GENERAL: no acute distress, moderately obese body habitus EYES: conjunctivae normal, sclerae normal, normal eye lids MOUTH/THROAT: lips without lesions,no lesions in the mouth,tongue is without lesions,uvula elevates in midline NECK: supple, trachea midline, no neck masses, no thyroid tenderness, no thyromegaly LYMPHATICS: no LAN in the neck, no supraclavicular LAN RESPIRATORY: breathing is even & unlabored,  BS CTAB CARDIAC: RRR, no murmur,no extra heart sounds, +2 bilateral lower extremity edema GI:  ABDOMEN: abdomen soft, normal BS, no masses, no tenderness  LIVER/SPLEEN: no hepatomegaly, no splenomegaly MUSCULOSKELETAL: HEAD: normal to inspection & palpation BACK: no kyphosis, scoliosis or spinal processes tenderness EXTREMITIES: LEFT UPPER EXTREMITY: Moderate range of motion, normal strength & tone RIGHT UPPER EXTREMITY:  Moderate range of motion, normal strength & tone LEFT LOWER EXTREMITY:  full range of motion, normal strength & tone RIGHT LOWER EXTREMITY:  range of motion not tested due to surgery, normal strength & tone PSYCHIATRIC: the patient is alert & oriented to person, affect & behavior appropriate  LABS/RADIOLOGY:  Labs reviewed: Basic Metabolic Panel:  Recent Labs  11/27/12 1935 11/29/12 0535 06/11/13 1130  NA 142 139 143  K 4.3 4.1 4.3  CL 108 105 108  CO2 25 25 24   GLUCOSE 86 87 79  BUN 28* 23 19  CREATININE 1.33* 1.31* 1.04  CALCIUM 8.9 8.8 9.9   Liver Function Tests:  Recent Labs  11/27/12 1935  AST 25  ALT 13  ALKPHOS 95  BILITOT 0.2*  PROT 7.1  ALBUMIN 3.1*    Recent Labs  11/28/12 1038  LIPASE 15   CBC:  Recent Labs  06/11/13 1130 06/20/13 0625 06/21/13 0656 06/22/13 0518  WBC 6.1 5.4 7.5 6.9  NEUTROABS 3.4  --   --   --   HGB 11.2* 8.5* 7.7* 7.6*  HCT 34.6* 25.9* 23.3* 22.7*  MCV 99.4 96.6 97.9 97.0  PLT 249 175 160 142*    Cardiac Enzymes:  Recent Labs  11/28/12 11/28/12 0501 11/28/12 1038  TROPONINI <0.30 <0.30 <0.30    PORTABLE RIGHT KNEE - 1-2 VIEW   FINDINGS: Patient status post total right knee replacement with good anatomic alignment. No acute abnormality identified. Postsurgical changes are present.   IMPRESSION: Patient status post total right knee replacement with good anatomic alignment.     ASSESSMENT/PLAN:  Right knee pain-secondary to failure of total knee arthroplasty. Status post revision.  Continue rehabilitation. Increase fentanyl patch to 50 mcg q. 72 hours. Acute blood loss anemia-recheck. Continue iron. Cardiomyopathy-well compensated Dementia-stable Hypertension-blood pressure borderline likely secondary to pain. Will monitor for now. Check CBC  I have reviewed patient's medical records received at admission/from hospitalization.  CPT CODE: 85277  Gayani Y Dasanayaka, [place] [phone]

## 2013-07-02 ENCOUNTER — Encounter: Payer: Self-pay | Admitting: Adult Health

## 2013-07-02 DIAGNOSIS — K5900 Constipation, unspecified: Secondary | ICD-10-CM | POA: Insufficient documentation

## 2013-07-02 NOTE — Progress Notes (Signed)
Patient ID: Jennifer [name], female   DOB: 1936-01-11, 78 y.o.   MRN: [id number]               PROGRESS NOTE  DATE: 06/24/2013  FACILITY: Nursing Home Location: [facility]  LEVEL OF CARE: SNF (31)  Acute Visit  CHIEF COMPLAINT:  Follow-up Hospitalization  HISTORY OF PRESENT ILLNESS: This is a 78 year old female who has been admitted to [facility] on 06/22/13 from [facility] with Mechanical complication of knee prosthesis S/P Right total knee revision. She has been admitted for a short-term rehabilitation.   REASSESSMENT OF ONGOING PROBLEM(S):  HTN: Pt 's HTN remains stable.  Denies CP, sob, DOE, pedal edema, headaches, dizziness or visual disturbances.  No complications from the medications currently being used.  Last BP :123/53  ANEMIA: The anemia has been stable. The patient denies fatigue, melena or hematochezia. No complications from the medications currently being used. 5/15 hgb 7.7  GERD: pt's GERD is stable.  Denies ongoing heartburn, abd. Pain, nausea or vomiting.  Currently on a PPI & tolerates it without any adverse reactions.  PAST MEDICAL HISTORY : Reviewed.  No changes/see problem list  CURRENT MEDICATIONS: Reviewed per MAR/see medication list  REVIEW OF SYSTEMS:  GENERAL: no change in appetite, no fatigue, no weight changes, no fever, chills or weakness RESPIRATORY: no cough, SOB, DOE, wheezing, hemoptysis CARDIAC: no chest pain, edema or palpitations GI: no abdominal pain, diarrhea, heart burn, nausea or vomiting, +constipation  PHYSICAL EXAMINATION  GENERAL: no acute distress, normal body habitus EYES: conjunctivae normal, sclerae normal, normal eye lids NECK: supple, trachea midline, no neck masses, no thyroid tenderness, no thyromegaly LYMPHATICS: no LAN in the neck, no supraclavicular LAN RESPIRATORY: breathing is even & unlabored, BS CTAB CARDIAC: RRR, no murmur,no extra heart sounds, no edema GI: abdomen soft, normal BS, no  masses, no tenderness, no hepatomegaly, no splenomegaly PSYCHIATRIC: the patient is alert & oriented to person, affect & behavior appropriate  LABS/RADIOLOGY: Labs reviewed: Basic Metabolic Panel:  Recent Labs  11/27/12 1935 11/29/12 0535 06/11/13 1130  NA 142 139 143  K 4.3 4.1 4.3  CL 108 105 108  CO2 25 25 24   GLUCOSE 86 87 79  BUN 28* 23 19  CREATININE 1.33* 1.31* 1.04  CALCIUM 8.9 8.8 9.9   Liver Function Tests:  Recent Labs  11/27/12 1935  AST 25  ALT 13  ALKPHOS 95  BILITOT 0.2*  PROT 7.1  ALBUMIN 3.1*    Recent Labs  11/28/12 1038  LIPASE 15   CBC:  Recent Labs  06/11/13 1130 06/20/13 0625 06/21/13 0656 06/22/13 0518  WBC 6.1 5.4 7.5 6.9  NEUTROABS 3.4  --   --   --   HGB 11.2* 8.5* 7.7* 7.6*  HCT 34.6* 25.9* 23.3* 22.7*  MCV 99.4 96.6 97.9 97.0  PLT 249 175 160 142*    Cardiac Enzymes:  Recent Labs  11/28/12 11/28/12 0501 11/28/12 1038  TROPONINI <0.30 <0.30 <0.30    ASSESSMENT/PLAN:  Mechanical complication of knee prostheses status post right total knee revision - for rehabilitation Hypertension - well-controlled; continue Coreg Anemia, acute blood loss - continue ferrous sulfate GERD - continue Protonix Insomnia - continue Restoril Anxiety - continue Ativan Depression - continue Wellbutrin Constipation - start Colace 100 mg PO BID    CPT CODE: 99371  Tranika Scholler Vargas - NP [facility] [phone]

## 2013-07-09 ENCOUNTER — Other Ambulatory Visit: Payer: Self-pay | Admitting: Orthopedic Surgery

## 2013-07-11 ENCOUNTER — Encounter (HOSPITAL_COMMUNITY): Payer: Self-pay | Admitting: *Deleted

## 2013-07-11 MED ORDER — CEFAZOLIN SODIUM-DEXTROSE 2-3 GM-% IV SOLR
2.0000 g | INTRAVENOUS | Status: AC
  Administered 2013-07-12: 2 g via INTRAVENOUS
  Filled 2013-07-11: qty 50

## 2013-07-11 MED ORDER — CHLORHEXIDINE GLUCONATE 4 % EX LIQD
60.0000 mL | Freq: Once | CUTANEOUS | Status: DC
  Filled 2013-07-11: qty 60

## 2013-07-11 NOTE — H&P (Signed)
Consepcion Utt [name] is an 78 y.o. female.   Chief Complaint: Acute right knee pain  HPI: Status post-complex right total knee revision with removal of very loose tibial implant and revision to a long stem Stryker TS implant 06/19/2013 3 weeks ago.  Patient was getting out wheelchair at [place] [facility].  Pop in her knee with immediate pain and swelling this morning and is had difficulty trying to extend her knee ever since.  No fevers no chills no wound problems.  Prior to this incident she was actually doing very well.  Past Medical History  Diagnosis Date  . Dementia   . Arthritis   . Blood transfusion   . Hypertension   . Psychosis   . Cardiomyopathy, nonischemic     EF initially 30% but improved with medical therapy, EF now 50-55%.  [name] LBBB (left bundle branch block)   . Paget's disease of bone 11/29/2012  . 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 Anginal pain     pt has chest pain that comes and goes Dr. Claiborne Billings heart Dr is aware  . Seizures     approximately 20 years since last seizure  . Frequency of urination     at night  . Diarrhea   . Constipation due to pain medication   . GERD (gastroesophageal reflux disease)   . Anemia   . Swelling of both ankles     Takes Lasix if needed  . Glaucoma   . Depression   . Anxiety   . TIA (transient ischemic attack)   . Thyroid disease 04/13/2006    hypothyroidism  . Osteoporosis 04/13/2006    Past Surgical History  Procedure Laterality Date  . Cholecystectomy    . Esophagogastroduodenoscopy N/A 11/30/2012    Procedure: ESOPHAGOGASTRODUODENOSCOPY (EGD);  Surgeon: Wonda Horner, MD;  Location: [facility] ENDOSCOPY;  Service: Endoscopy;  Laterality: N/A;  . Tubal ligation    . Joint replacement Bilateral     knees  . Tonsillectomy    . Cardiac catheterization    . Foot surgery Left   . Breast lumpectomy Left     x 2  . Shoulder surgery Right   . Eye surgery      cataract removal pt  unsure which eye  . Total knee revision Right 06/19/2013    Procedure: RIGHT TOTAL KNEE REVISION;  Surgeon: Kerin Salen, MD;  Location: [place];  Service: Orthopedics;  Laterality: Right;    History reviewed. No pertinent family history. Social History:  reports that she has never smoked. Her smokeless tobacco use includes Snuff. She reports that she does not drink alcohol or use illicit drugs.  Allergies:  Allergies  Allergen Reactions  . Codeine Other (See Comments)    Makes her sick on her stomach.    No prescriptions prior to admission    No results found for this or any previous visit (from the past 48 hour(s)). No results found.  Review of Systems  Constitutional: Negative.   HENT: Positive for tinnitus.   Eyes: Positive for blurred vision.       Glasses  Respiratory: Negative.   Cardiovascular: Negative.   Gastrointestinal: Negative.   Musculoskeletal: Positive for joint pain.  Skin: Negative.   Neurological: Negative.   Endo/Heme/Allergies: Negative.   Psychiatric/Behavioral: Positive for depression.    There were no vitals taken for this visit. Physical Exam  Constitutional: She is oriented to person,  place, and time. She appears well-developed and well-nourished.  HENT:  Head: Normocephalic and atraumatic.  Eyes: Pupils are equal, round, and reactive to light.  Neck: Normal range of motion. Neck supple.  Cardiovascular: Intact distal pulses.   Respiratory: Effort normal.  Musculoskeletal: She exhibits tenderness.  Well-nourished well-developed patient seated on the exam table in no obvious distress no shortness of breath.  Vertical wound is healed she has 1-2+ effusion, 1 to plus swelling tender along the tract of the patellar tendon, as well as the quadriceps tendon insertion.  She moves her foot up and down, difficulty she has a great deal of difficulty extending her knee against gravity and can only come to about 30 with significant discomfort.   Neurological: She is alert and oriented to person, place, and time.  Skin: Skin is warm and dry.  Psychiatric: She has a normal mood and affect. Her behavior is normal. Judgment and thought content normal.     Assessment/Plan Assess: Acute patellar tendon rupture after revision right total knee arthroplasty 3 weeks ago  Plan: Situation was discussed with the patient she will need a repair/reconstruction using a semitendinosus autograft through the base of the patella using a doubled over graft technique and a knee immobilizer for minimum of 6 weeks.  We'll try to get her on the schedule some time this week to minimize morbidity.  The risks and benefits of surgery were discussed at length.  [name] 07/11/2013, 5:53 PM

## 2013-07-11 NOTE — Progress Notes (Signed)
SDW  Pre-op call completed by pt nurse Diane of [facility]. Diane to fax copy of pt current MAR. Nurse also made aware to hold  Aspirin, vitamins and herbal medications. Do not take any NSAIDs ie: Ibuprofen, Advil, Naproxen or any medication containing Aspirin.

## 2013-07-12 ENCOUNTER — Inpatient Hospital Stay (HOSPITAL_COMMUNITY): Payer: Medicare Other | Admitting: Anesthesiology

## 2013-07-12 ENCOUNTER — Encounter (HOSPITAL_COMMUNITY): Payer: Medicare Other | Admitting: Anesthesiology

## 2013-07-12 ENCOUNTER — Inpatient Hospital Stay (HOSPITAL_COMMUNITY)
Admission: RE | Admit: 2013-07-12 | Discharge: 2013-07-15 | DRG: 501 | Disposition: A | Discharge status: Skilled Nursing Facility | Payer: Medicare Other | Source: Ambulatory Visit | Attending: Orthopedic Surgery | Admitting: Orthopedic Surgery

## 2013-07-12 ENCOUNTER — Encounter (HOSPITAL_COMMUNITY): Payer: Self-pay | Admitting: Anesthesiology

## 2013-07-12 ENCOUNTER — Encounter (HOSPITAL_COMMUNITY)
Admission: RE | Disposition: A | Discharge status: Skilled Nursing Facility | Payer: Self-pay | Source: Ambulatory Visit | Attending: Orthopedic Surgery

## 2013-07-12 ENCOUNTER — Inpatient Hospital Stay (HOSPITAL_COMMUNITY): Payer: Medicare Other

## 2013-07-12 DIAGNOSIS — N189 Chronic kidney disease, unspecified: Secondary | ICD-10-CM | POA: Diagnosis present

## 2013-07-12 DIAGNOSIS — D62 Acute posthemorrhagic anemia: Secondary | ICD-10-CM | POA: Diagnosis not present

## 2013-07-12 DIAGNOSIS — Z79899 Other long term (current) drug therapy: Secondary | ICD-10-CM

## 2013-07-12 DIAGNOSIS — I129 Hypertensive chronic kidney disease with stage 1 through stage 4 chronic kidney disease, or unspecified chronic kidney disease: Secondary | ICD-10-CM | POA: Diagnosis present

## 2013-07-12 DIAGNOSIS — F411 Generalized anxiety disorder: Secondary | ICD-10-CM | POA: Diagnosis present

## 2013-07-12 DIAGNOSIS — H409 Unspecified glaucoma: Secondary | ICD-10-CM | POA: Diagnosis present

## 2013-07-12 DIAGNOSIS — M810 Age-related osteoporosis without current pathological fracture: Secondary | ICD-10-CM | POA: Diagnosis present

## 2013-07-12 DIAGNOSIS — F0390 Unspecified dementia without behavioral disturbance: Secondary | ICD-10-CM | POA: Diagnosis present

## 2013-07-12 DIAGNOSIS — I428 Other cardiomyopathies: Secondary | ICD-10-CM | POA: Diagnosis present

## 2013-07-12 DIAGNOSIS — T84098A Other mechanical complication of other internal joint prosthesis, initial encounter: Secondary | ICD-10-CM

## 2013-07-12 DIAGNOSIS — F3289 Other specified depressive episodes: Secondary | ICD-10-CM | POA: Diagnosis present

## 2013-07-12 DIAGNOSIS — S86819A Strain of other muscle(s) and tendon(s) at lower leg level, unspecified leg, initial encounter: Secondary | ICD-10-CM | POA: Diagnosis present

## 2013-07-12 DIAGNOSIS — Z8673 Personal history of transient ischemic attack (TIA), and cerebral infarction without residual deficits: Secondary | ICD-10-CM

## 2013-07-12 DIAGNOSIS — M66269 Spontaneous rupture of extensor tendons, unspecified lower leg: Principal | ICD-10-CM | POA: Diagnosis present

## 2013-07-12 DIAGNOSIS — Z96659 Presence of unspecified artificial knee joint: Secondary | ICD-10-CM

## 2013-07-12 DIAGNOSIS — K219 Gastro-esophageal reflux disease without esophagitis: Secondary | ICD-10-CM | POA: Diagnosis present

## 2013-07-12 DIAGNOSIS — E039 Hypothyroidism, unspecified: Secondary | ICD-10-CM | POA: Diagnosis present

## 2013-07-12 DIAGNOSIS — F329 Major depressive disorder, single episode, unspecified: Secondary | ICD-10-CM | POA: Diagnosis present

## 2013-07-12 HISTORY — PX: PATELLAR TENDON REPAIR: SHX737

## 2013-07-12 LAB — BASIC METABOLIC PANEL
BUN: 12 mg/dL (ref 6–23)
CHLORIDE: 105 meq/L (ref 96–112)
CO2: 26 mEq/L (ref 19–32)
CREATININE: 0.98 mg/dL (ref 0.50–1.10)
Calcium: 9.8 mg/dL (ref 8.4–10.5)
GFR calc non Af Amer: 55 mL/min — ABNORMAL LOW (ref >90)
GFR, EST AFRICAN AMERICAN: 64 mL/min — AB (ref >90)
GLUCOSE: 94 mg/dL (ref 70–99)
Potassium: 4.6 mEq/L (ref 3.7–5.3)
Sodium: 143 mEq/L (ref 137–147)

## 2013-07-12 LAB — CBC
HCT: 32.5 % — ABNORMAL LOW (ref 36.0–46.0)
Hemoglobin: 10.5 g/dL — ABNORMAL LOW (ref 12.0–15.0)
MCH: 31.1 pg (ref 26.0–34.0)
MCHC: 32.3 g/dL (ref 30.0–36.0)
MCV: 96.2 fL (ref 78.0–100.0)
PLATELETS: 315 10*3/uL (ref 150–400)
RBC: 3.38 MIL/uL — AB (ref 3.87–5.11)
RDW: 16.4 % — ABNORMAL HIGH (ref 11.5–15.5)
WBC: 6.6 10*3/uL (ref 4.0–10.5)

## 2013-07-12 SURGERY — REPAIR, TENDON, PATELLAR
Anesthesia: General | Site: Knee | Laterality: Right

## 2013-07-12 MED ORDER — GLYCOPYRROLATE 0.2 MG/ML IJ SOLN
INTRAMUSCULAR | Status: AC
  Filled 2013-07-12: qty 2

## 2013-07-12 MED ORDER — NEOSTIGMINE METHYLSULFATE 10 MG/10ML IV SOLN
INTRAVENOUS | Status: AC
  Filled 2013-07-12: qty 1

## 2013-07-12 MED ORDER — FENTANYL CITRATE 0.05 MG/ML IJ SOLN
INTRAMUSCULAR | Status: DC | PRN
  Administered 2013-07-12 (×3): 50 ug via INTRAVENOUS

## 2013-07-12 MED ORDER — METHOCARBAMOL 500 MG PO TABS
500.0000 mg | ORAL_TABLET | Freq: Two times a day (BID) | ORAL | Status: DC
  Administered 2013-07-13 – 2013-07-15 (×4): 500 mg via ORAL
  Filled 2013-07-12 (×10): qty 1

## 2013-07-12 MED ORDER — ROCURONIUM BROMIDE 50 MG/5ML IV SOLN
INTRAVENOUS | Status: AC
  Filled 2013-07-12: qty 1

## 2013-07-12 MED ORDER — NEOSTIGMINE METHYLSULFATE 10 MG/10ML IV SOLN
INTRAVENOUS | Status: DC | PRN
  Administered 2013-07-12: 3 mg via INTRAVENOUS

## 2013-07-12 MED ORDER — LACTATED RINGERS IV SOLN
INTRAVENOUS | Status: DC
  Administered 2013-07-12: 09:00:00 via INTRAVENOUS

## 2013-07-12 MED ORDER — ONDANSETRON HCL 4 MG PO TABS
4.0000 mg | ORAL_TABLET | Freq: Four times a day (QID) | ORAL | Status: DC | PRN
  Administered 2013-07-15: 4 mg via ORAL
  Filled 2013-07-12 (×2): qty 1

## 2013-07-12 MED ORDER — BUPIVACAINE HCL (PF) 0.25 % IJ SOLN
INTRAMUSCULAR | Status: DC | PRN
  Administered 2013-07-12: 30 mL

## 2013-07-12 MED ORDER — GLYCOPYRROLATE 0.2 MG/ML IJ SOLN
INTRAMUSCULAR | Status: DC | PRN
  Administered 2013-07-12: 0.4 mg via INTRAVENOUS

## 2013-07-12 MED ORDER — ONDANSETRON HCL 4 MG/2ML IJ SOLN: 4.0000 mg | Freq: Once | INTRAMUSCULAR | Status: DC | PRN

## 2013-07-12 MED ORDER — SENNOSIDES-DOCUSATE SODIUM 8.6-50 MG PO TABS: 1.0000 | ORAL_TABLET | Freq: Every evening | ORAL | Status: DC | PRN

## 2013-07-12 MED ORDER — CARVEDILOL 12.5 MG PO TABS
12.5000 mg | ORAL_TABLET | Freq: Two times a day (BID) | ORAL | Status: DC
  Administered 2013-07-12 – 2013-07-15 (×5): 12.5 mg via ORAL
  Filled 2013-07-12 (×8): qty 1

## 2013-07-12 MED ORDER — BUPIVACAINE LIPOSOME 1.3 % IJ SUSP
20.0000 mL | Freq: Once | INTRAMUSCULAR | Status: DC
  Filled 2013-07-12: qty 20

## 2013-07-12 MED ORDER — LISINOPRIL 10 MG PO TABS
10.0000 mg | ORAL_TABLET | Freq: Every morning | ORAL | Status: DC
  Administered 2013-07-13 – 2013-07-15 (×3): 10 mg via ORAL
  Filled 2013-07-12 (×3): qty 1

## 2013-07-12 MED ORDER — DOCUSATE SODIUM 100 MG PO CAPS
100.0000 mg | ORAL_CAPSULE | Freq: Two times a day (BID) | ORAL | Status: DC
  Administered 2013-07-12 – 2013-07-15 (×6): 100 mg via ORAL
  Filled 2013-07-12 (×8): qty 1

## 2013-07-12 MED ORDER — ALUM & MAG HYDROXIDE-SIMETH 200-200-20 MG/5ML PO SUSP
30.0000 mL | ORAL | Status: DC | PRN
  Administered 2013-07-14: 30 mL via ORAL
  Filled 2013-07-12: qty 30

## 2013-07-12 MED ORDER — LATANOPROST 0.005 % OP SOLN
1.0000 [drp] | Freq: Every day | OPHTHALMIC | Status: DC
  Administered 2013-07-12 – 2013-07-14 (×3): 1 [drp] via OPHTHALMIC
  Filled 2013-07-12: qty 2.5

## 2013-07-12 MED ORDER — ACETAMINOPHEN 650 MG RE SUPP: 650.0000 mg | Freq: Four times a day (QID) | RECTAL | Status: DC | PRN

## 2013-07-12 MED ORDER — HYDROCODONE-ACETAMINOPHEN 5-325 MG PO TABS
1.0000 | ORAL_TABLET | Freq: Two times a day (BID) | ORAL | Status: DC | PRN
  Administered 2013-07-13 – 2013-07-15 (×4): 1 via ORAL
  Filled 2013-07-12 (×4): qty 1

## 2013-07-12 MED ORDER — FENTANYL CITRATE 0.05 MG/ML IJ SOLN
INTRAMUSCULAR | Status: AC
  Filled 2013-07-12: qty 5

## 2013-07-12 MED ORDER — KCL IN DEXTROSE-NACL 20-5-0.45 MEQ/L-%-% IV SOLN
INTRAVENOUS | Status: DC
  Administered 2013-07-12: 125 mL/h via INTRAVENOUS
  Administered 2013-07-13: 22:00:00 via INTRAVENOUS
  Filled 2013-07-12 (×12): qty 1000

## 2013-07-12 MED ORDER — MAGNESIUM CITRATE PO SOLN: 1.0000 | Freq: Once | ORAL | Status: AC | PRN

## 2013-07-12 MED ORDER — HYDROMORPHONE HCL PF 1 MG/ML IJ SOLN
0.2500 mg | INTRAMUSCULAR | Status: DC | PRN
  Administered 2013-07-12: 0.25 mg via INTRAVENOUS
  Administered 2013-07-12: 0.5 mg via INTRAVENOUS
  Administered 2013-07-12: 0.25 mg via INTRAVENOUS

## 2013-07-12 MED ORDER — BISACODYL 5 MG PO TBEC
5.0000 mg | DELAYED_RELEASE_TABLET | Freq: Every day | ORAL | Status: DC | PRN
  Filled 2013-07-12: qty 1

## 2013-07-12 MED ORDER — ASPIRIN EC 325 MG PO TBEC
325.0000 mg | DELAYED_RELEASE_TABLET | Freq: Two times a day (BID) | ORAL | Status: DC
  Administered 2013-07-12 – 2013-07-15 (×6): 325 mg via ORAL
  Filled 2013-07-12 (×7): qty 1

## 2013-07-12 MED ORDER — PANTOPRAZOLE SODIUM 40 MG PO TBEC
40.0000 mg | DELAYED_RELEASE_TABLET | Freq: Every day | ORAL | Status: DC
  Administered 2013-07-13 – 2013-07-15 (×3): 40 mg via ORAL
  Filled 2013-07-12 (×2): qty 1

## 2013-07-12 MED ORDER — LIDOCAINE HCL (CARDIAC) 20 MG/ML IV SOLN
INTRAVENOUS | Status: AC
  Filled 2013-07-12: qty 5

## 2013-07-12 MED ORDER — METOCLOPRAMIDE HCL 5 MG PO TABS
5.0000 mg | ORAL_TABLET | Freq: Three times a day (TID) | ORAL | Status: DC | PRN
  Filled 2013-07-12: qty 2

## 2013-07-12 MED ORDER — BUPROPION HCL ER (XL) 150 MG PO TB24
150.0000 mg | ORAL_TABLET | Freq: Every morning | ORAL | Status: DC
  Administered 2013-07-13 – 2013-07-15 (×3): 150 mg via ORAL
  Filled 2013-07-12 (×3): qty 1

## 2013-07-12 MED ORDER — ONDANSETRON HCL 4 MG/2ML IJ SOLN
INTRAMUSCULAR | Status: DC | PRN
  Administered 2013-07-12: 4 mg via INTRAVENOUS

## 2013-07-12 MED ORDER — METOCLOPRAMIDE HCL 5 MG/ML IJ SOLN: 5.0000 mg | Freq: Three times a day (TID) | INTRAMUSCULAR | Status: DC | PRN

## 2013-07-12 MED ORDER — HYDROCODONE-ACETAMINOPHEN 5-325 MG PO TABS: 1.0000 | ORAL_TABLET | Freq: Four times a day (QID) | ORAL | Status: DC | PRN

## 2013-07-12 MED ORDER — ACETAMINOPHEN 325 MG PO TABS
650.0000 mg | ORAL_TABLET | Freq: Four times a day (QID) | ORAL | Status: DC | PRN
  Administered 2013-07-13 – 2013-07-15 (×3): 650 mg via ORAL
  Filled 2013-07-12 (×3): qty 2

## 2013-07-12 MED ORDER — PROPOFOL 10 MG/ML IV BOLUS
INTRAVENOUS | Status: DC | PRN
  Administered 2013-07-12: 90 mg via INTRAVENOUS

## 2013-07-12 MED ORDER — ROCURONIUM BROMIDE 100 MG/10ML IV SOLN
INTRAVENOUS | Status: DC | PRN
  Administered 2013-07-12: 20 mg via INTRAVENOUS

## 2013-07-12 MED ORDER — RISPERIDONE 0.25 MG PO TABS
0.2500 mg | ORAL_TABLET | Freq: Every day | ORAL | Status: DC
  Administered 2013-07-12 – 2013-07-14 (×3): 0.25 mg via ORAL
  Filled 2013-07-12 (×4): qty 1

## 2013-07-12 MED ORDER — LACTATED RINGERS IV SOLN
INTRAVENOUS | Status: DC | PRN
  Administered 2013-07-12 (×2): via INTRAVENOUS

## 2013-07-12 MED ORDER — ASPIRIN EC 325 MG PO TBEC: 325.0000 mg | DELAYED_RELEASE_TABLET | Freq: Two times a day (BID) | ORAL | Status: DC

## 2013-07-12 MED ORDER — LORAZEPAM 0.5 MG PO TABS: 0.5000 mg | ORAL_TABLET | Freq: Two times a day (BID) | ORAL | Status: DC

## 2013-07-12 MED ORDER — OXYCODONE HCL 5 MG PO TABS
5.0000 mg | ORAL_TABLET | ORAL | Status: DC | PRN
  Administered 2013-07-12 – 2013-07-14 (×4): 10 mg via ORAL
  Filled 2013-07-12 (×4): qty 2

## 2013-07-12 MED ORDER — ONDANSETRON HCL 4 MG/2ML IJ SOLN
4.0000 mg | Freq: Four times a day (QID) | INTRAMUSCULAR | Status: DC | PRN
  Filled 2013-07-12: qty 2

## 2013-07-12 MED ORDER — HYDROMORPHONE HCL PF 1 MG/ML IJ SOLN
INTRAMUSCULAR | Status: AC
  Filled 2013-07-12: qty 1

## 2013-07-12 MED ORDER — 0.9 % SODIUM CHLORIDE (POUR BTL) OPTIME
TOPICAL | Status: DC | PRN
  Administered 2013-07-12: 1000 mL

## 2013-07-12 MED ORDER — METHOCARBAMOL 500 MG PO TABS
500.0000 mg | ORAL_TABLET | Freq: Four times a day (QID) | ORAL | Status: DC | PRN
  Administered 2013-07-12 – 2013-07-15 (×3): 500 mg via ORAL
  Filled 2013-07-12 (×3): qty 1

## 2013-07-12 MED ORDER — FERROUS SULFATE 325 (65 FE) MG PO TABS
325.0000 mg | ORAL_TABLET | ORAL | Status: DC
  Administered 2013-07-12 – 2013-07-15 (×2): 325 mg via ORAL
  Filled 2013-07-12 (×2): qty 1

## 2013-07-12 MED ORDER — TEMAZEPAM 15 MG PO CAPS
15.0000 mg | ORAL_CAPSULE | Freq: Every day | ORAL | Status: DC
  Administered 2013-07-12 – 2013-07-14 (×3): 15 mg via ORAL
  Filled 2013-07-12 (×3): qty 1

## 2013-07-12 MED ORDER — LORAZEPAM 0.5 MG PO TABS
0.5000 mg | ORAL_TABLET | Freq: Every day | ORAL | Status: DC
  Administered 2013-07-13 – 2013-07-15 (×3): 0.5 mg via ORAL
  Filled 2013-07-12 (×3): qty 1

## 2013-07-12 MED ORDER — PHENOL 1.4 % MT LIQD: 1.0000 | OROMUCOSAL | Status: DC | PRN

## 2013-07-12 MED ORDER — FENTANYL 25 MCG/HR TD PT72
25.0000 ug | MEDICATED_PATCH | TRANSDERMAL | Status: DC
  Administered 2013-07-15: 25 ug via TRANSDERMAL
  Filled 2013-07-12: qty 1

## 2013-07-12 MED ORDER — METHOCARBAMOL 500 MG PO TABS: 500.0000 mg | ORAL_TABLET | Freq: Two times a day (BID) | ORAL | Status: DC

## 2013-07-12 MED ORDER — ARIPIPRAZOLE 5 MG PO TABS
5.0000 mg | ORAL_TABLET | Freq: Every morning | ORAL | Status: DC
  Administered 2013-07-13 – 2013-07-15 (×3): 5 mg via ORAL
  Filled 2013-07-12 (×3): qty 1

## 2013-07-12 MED ORDER — MENTHOL 3 MG MT LOZG: 1.0000 | LOZENGE | OROMUCOSAL | Status: DC | PRN

## 2013-07-12 MED ORDER — LORAZEPAM 1 MG PO TABS
1.0000 mg | ORAL_TABLET | Freq: Every day | ORAL | Status: DC
  Administered 2013-07-12 – 2013-07-14 (×3): 1 mg via ORAL
  Filled 2013-07-12 (×3): qty 1

## 2013-07-12 MED ORDER — LIDOCAINE HCL (CARDIAC) 20 MG/ML IV SOLN
INTRAVENOUS | Status: DC | PRN
  Administered 2013-07-12 (×2): 80 mg via INTRAVENOUS

## 2013-07-12 MED ORDER — ONDANSETRON HCL 4 MG/2ML IJ SOLN
INTRAMUSCULAR | Status: AC
  Filled 2013-07-12: qty 2

## 2013-07-12 MED ORDER — DIPHENHYDRAMINE HCL 12.5 MG/5ML PO ELIX: 12.5000 mg | ORAL_SOLUTION | ORAL | Status: DC | PRN

## 2013-07-12 MED ORDER — METHOCARBAMOL 1000 MG/10ML IJ SOLN
500.0000 mg | Freq: Four times a day (QID) | INTRAVENOUS | Status: DC | PRN
  Filled 2013-07-12: qty 5

## 2013-07-12 MED ORDER — PROPOFOL 10 MG/ML IV BOLUS
INTRAVENOUS | Status: AC
  Filled 2013-07-12: qty 20

## 2013-07-12 SURGICAL SUPPLY — 72 items
ANCHOR CORKSCREW BIO 5.5 FT (Anchor) IMPLANT
BANDAGE ELASTIC 4 VELCRO ST LF (GAUZE/BANDAGES/DRESSINGS) IMPLANT
BANDAGE ELASTIC 6 VELCRO ST LF (GAUZE/BANDAGES/DRESSINGS) ×3 IMPLANT
BANDAGE ESMARK 6X9 LF (GAUZE/BANDAGES/DRESSINGS) ×1 IMPLANT
BIT DRILL 7/64X5 DISP (BIT) IMPLANT
BNDG COHESIVE 4X5 TAN STRL (GAUZE/BANDAGES/DRESSINGS) ×3 IMPLANT
BNDG ESMARK 6X9 LF (GAUZE/BANDAGES/DRESSINGS) ×3
COVER MAYO STAND STRL (DRAPES) IMPLANT
CUFF TOURNIQUET SINGLE 34IN LL (TOURNIQUET CUFF) ×3 IMPLANT
CUFF TOURNIQUET SINGLE 44IN (TOURNIQUET CUFF) IMPLANT
DRAPE INCISE IOBAN 66X45 STRL (DRAPES) ×3 IMPLANT
DRAPE U-SHAPE 47X51 STRL (DRAPES) ×3 IMPLANT
DRSG ADAPTIC 3X8 NADH LF (GAUZE/BANDAGES/DRESSINGS) ×3 IMPLANT
DRSG PAD ABDOMINAL 8X10 ST (GAUZE/BANDAGES/DRESSINGS) IMPLANT
DURAPREP 26ML APPLICATOR (WOUND CARE) ×3 IMPLANT
ELECT REM PT RETURN 9FT ADLT (ELECTROSURGICAL) ×3
ELECTRODE REM PT RTRN 9FT ADLT (ELECTROSURGICAL) ×1 IMPLANT
GAUZE XEROFORM 1X8 LF (GAUZE/BANDAGES/DRESSINGS) IMPLANT
GLOVE BIO SURGEON STRL SZ7.5 (GLOVE) ×3 IMPLANT
GLOVE BIO SURGEON STRL SZ8.5 (GLOVE) ×3 IMPLANT
GLOVE BIOGEL PI IND STRL 6 (GLOVE) ×1 IMPLANT
GLOVE BIOGEL PI IND STRL 7.0 (GLOVE) ×1 IMPLANT
GLOVE BIOGEL PI IND STRL 8 (GLOVE) ×1 IMPLANT
GLOVE BIOGEL PI IND STRL 9 (GLOVE) ×1 IMPLANT
GLOVE BIOGEL PI INDICATOR 6 (GLOVE) ×2
GLOVE BIOGEL PI INDICATOR 7.0 (GLOVE) ×2
GLOVE BIOGEL PI INDICATOR 8 (GLOVE) ×2
GLOVE BIOGEL PI INDICATOR 9 (GLOVE) ×2
GLOVE SURG SS PI 7.0 STRL IVOR (GLOVE) ×3 IMPLANT
GOWN EXTRA PROTECTION XXL 0583 (GOWNS) ×3 IMPLANT
GOWN STRL REUS W/ TWL LRG LVL3 (GOWN DISPOSABLE) ×2 IMPLANT
GOWN STRL REUS W/ TWL XL LVL3 (GOWN DISPOSABLE) ×1 IMPLANT
GOWN STRL REUS W/TWL LRG LVL3 (GOWN DISPOSABLE) ×4
GOWN STRL REUS W/TWL XL LVL3 (GOWN DISPOSABLE) ×2
HOOD PEEL AWAY FACE SHEILD DIS (HOOD) ×6 IMPLANT
IMMOBILIZER KNEE 22 UNIV (SOFTGOODS) ×3 IMPLANT
KIT BASIN OR (CUSTOM PROCEDURE TRAY) ×3 IMPLANT
KIT ROOM TURNOVER OR (KITS) ×3 IMPLANT
MANIFOLD NEPTUNE II (INSTRUMENTS) IMPLANT
NEEDLE 1/2 CIR MAYO (NEEDLE) ×3 IMPLANT
NEEDLE 22X1 1/2 (OR ONLY) (NEEDLE) ×3 IMPLANT
NS IRRIG 1000ML POUR BTL (IV SOLUTION) ×3 IMPLANT
PACK ORTHO EXTREMITY (CUSTOM PROCEDURE TRAY) ×3 IMPLANT
PAD ARMBOARD 7.5X6 YLW CONV (MISCELLANEOUS) ×3 IMPLANT
PAD CAST 4YDX4 CTTN HI CHSV (CAST SUPPLIES) ×1 IMPLANT
PADDING CAST COTTON 4X4 STRL (CAST SUPPLIES) ×2
PASSER SUT SWANSON 36MM LOOP (INSTRUMENTS) ×3 IMPLANT
SET CARDIOPLEGIA MPS 5001102 (MISCELLANEOUS) ×3 IMPLANT
SPONGE GAUZE 4X4 12PLY (GAUZE/BANDAGES/DRESSINGS) ×3 IMPLANT
SPONGE LAP 18X18 X RAY DECT (DISPOSABLE) ×3 IMPLANT
STAPLER VISISTAT 35W (STAPLE) ×3 IMPLANT
STOCKINETTE IMPERVIOUS 9X36 MD (GAUZE/BANDAGES/DRESSINGS) ×3 IMPLANT
SUCTION FRAZIER TIP 10 FR DISP (SUCTIONS) ×3 IMPLANT
SUT ETHIBOND 2 V 37 (SUTURE) IMPLANT
SUT FIBERWIRE #2 38 REV NDL BL (SUTURE) ×6
SUT PDS AB 4-0 P3 18 (SUTURE) IMPLANT
SUT VIC AB 0 CT1 27 (SUTURE) ×2
SUT VIC AB 0 CT1 27XBRD ANBCTR (SUTURE) ×1 IMPLANT
SUT VIC AB 1 CT1 27 (SUTURE) ×2
SUT VIC AB 1 CT1 27XBRD ANBCTR (SUTURE) ×1 IMPLANT
SUT VIC AB 2-0 CTB1 (SUTURE) ×3 IMPLANT
SUTURE FIBERWR#2 38 REV NDL BL (SUTURE) ×2 IMPLANT
SWAB COLLECTION DEVICE MRSA (MISCELLANEOUS) ×3 IMPLANT
SYR CONTROL 10ML LL (SYRINGE) IMPLANT
TOWEL OR 17X24 6PK STRL BLUE (TOWEL DISPOSABLE) IMPLANT
TOWEL OR 17X26 10 PK STRL BLUE (TOWEL DISPOSABLE) ×3 IMPLANT
TUBE ANAEROBIC SPECIMEN COL (MISCELLANEOUS) ×3 IMPLANT
TUBE CONNECTING 12'X1/4 (SUCTIONS) ×1
TUBE CONNECTING 12X1/4 (SUCTIONS) ×2 IMPLANT
UNDERPAD 30X30 INCONTINENT (UNDERPADS AND DIAPERS) IMPLANT
WATER STERILE IRR 1000ML POUR (IV SOLUTION) IMPLANT
YANKAUER SUCT BULB TIP NO VENT (SUCTIONS) ×3 IMPLANT

## 2013-07-12 NOTE — Anesthesia Procedure Notes (Signed)
Procedure Name: Intubation Date/Time: 07/12/2013 10:56 AM Performed by: Jenne Campus Pre-anesthesia Checklist: Patient identified, Emergency Drugs available, Suction available, Patient being monitored and Timeout performed Patient Re-evaluated:Patient Re-evaluated prior to inductionOxygen Delivery Method: Circle system utilized Preoxygenation: Pre-oxygenation with 100% oxygen Intubation Type: IV induction Ventilation: Mask ventilation without difficulty and Oral airway inserted - appropriate to patient size Laryngoscope Size: Miller and 2 Grade View: Grade I Tube type: Oral Tube size: 7.0 mm Number of attempts: 1 Airway Equipment and Method: Stylet Placement Confirmation: ETT inserted through vocal cords under direct vision,  positive ETCO2,  CO2 detector and breath sounds checked- equal and bilateral Secured at: 20 cm Tube secured with: Tape Dental Injury: Teeth and Oropharynx as per pre-operative assessment

## 2013-07-12 NOTE — Progress Notes (Signed)
Called for sign out

## 2013-07-12 NOTE — Progress Notes (Signed)
Utilization review completed.  

## 2013-07-12 NOTE — Interval H&P Note (Signed)
History and Physical Interval Note:  07/12/2013 10:33 AM  Jennifer [name]  has presented today for surgery, with the diagnosis of LEFT PATELLA TENDON RUPTURE AFTER TOTAL KNEE REPLACEMENT  The various methods of treatment have been discussed with the patient and family. After consideration of risks, benefits and other options for treatment, the patient has consented to  Procedure(s): LEFT PATELLA TENDON REPAIR/RECONSTRUCTION WITH SEMI T (Left) as a surgical intervention .  The patient's history has been reviewed, patient examined, no change in status, stable for surgery.  I have reviewed the patient's chart and labs.  Questions were answered to the patient's satisfaction.     Kerin Salen

## 2013-07-12 NOTE — Progress Notes (Signed)
OT Cancellation Note  Patient Details Name: Jennifer [name] MRN: [id number] DOB: 1936/01/11   Cancelled Treatment:    Reason Eval/Treat Not Completed: Other (comment) Pt is Medicare and from a SNF with probable current D/C plan back to SNF. No apparent immediate acute care OT needs, therefore will defer OT to SNF. If OT eval is needed please call Acute Rehab Dept. at [phone] or text page OT at [phone].    Almon Register [phone] 07/12/2013, 2:57 PM

## 2013-07-12 NOTE — Transfer of Care (Signed)
Immediate Anesthesia Transfer of Care Note  Patient: Jennifer [name]  Procedure(s) Performed: Procedure(s): PRIMARY LEFT PATELLA TENDON REPAIR (Right)  Patient Location: PACU  Anesthesia Type:General  Level of Consciousness: awake, oriented and patient cooperative  Airway & Oxygen Therapy: Patient Spontanous Breathing and Patient connected to face mask oxygen  Post-op Assessment: Report given to PACU RN and Post -op Vital signs reviewed and stable  Post vital signs: Reviewed  Complications: No apparent anesthesia complications

## 2013-07-12 NOTE — Anesthesia Preprocedure Evaluation (Addendum)
Anesthesia Evaluation  Patient identified by MRN, date of birth, ID band Patient awake    Reviewed: Allergy & Precautions, H&P , NPO status , Patient's Chart, lab work & pertinent test results, reviewed documented beta blocker date and time   History of Anesthesia Complications Negative for: history of anesthetic complications  Airway Mallampati: II TM Distance: >3 FB Neck ROM: Full    Dental  (+) Edentulous Upper, Dental Advisory Given   Pulmonary  breath sounds clear to auscultation        Cardiovascular hypertension, Pt. on medications and Pt. on home beta blockers + angina + dysrhythmias Rhythm:Regular Rate:Normal  06/06/13 Normal stress nuclear study.  NL LV Function; NL Wall Motion   Neuro/Psych Seizures -,  Anxiety Depression TIA   GI/Hepatic GERD-  Medicated and Controlled,  Endo/Other  Hypothyroidism   Renal/GU Renal InsufficiencyRenal disease     Musculoskeletal   Abdominal   Peds  Hematology  (+) anemia ,   Anesthesia Other Findings Dementia  Reproductive/Obstetrics                       Anesthesia Physical Anesthesia Plan  ASA: III  Anesthesia Plan: General   Post-op Pain Management:    Induction: Intravenous  Airway Management Planned: Oral ETT  Additional Equipment:   Intra-op Plan:   Post-operative Plan: Extubation in OR  Informed Consent: I have reviewed the patients History and Physical, chart, labs and discussed the procedure including the risks, benefits and alternatives for the proposed anesthesia with the patient or authorized representative who has indicated his/her understanding and acceptance.     Plan Discussed with:   Anesthesia Plan Comments:         Anesthesia Quick Evaluation

## 2013-07-12 NOTE — Op Note (Signed)
Pre Op Dx: Right patellar tendon rupture, status post revision total knee 5 weeks ago  Post Op Dx: Same  Procedure: Right patellar tendon repair using #2 FiberWire suture, and drill holes just distal to the tibial tubercle  Surgeon: Kerin Salen, MD  Assistant: Kerry Hough. Barton Dubois  (present throughout entire procedure and necessary for timely completion of the procedure)  Anesthesia: General  EBL: 150 cc  Fluids: 1500 cc of  Tourniquet Time: 37 minutes  Indications: Status post complex revision right total knee arthroplasty longstem tibial implant a little over 4 weeks ago. Was at [facility] place said she got to stand up out of a wheelchair and felt a pop in her right knee. She lost extension power at that point x-rays in the office showed ossicles of bone that had been avulsed off the inferior pole the patella these findings are consistent with a patellar tendon rupture. In order to allow ambulation, and decrease pain patellar tendon repair was discussed with the patient as well as the significant risks and benefits one of the primary risk is how difficult it is to heal a patellar tendon rupture after a total knee. She understands she'll be in a knee immobilizer for 6-8 weeks and will have limited flexion, hopefully she'll have power to do a straight leg raise.  Procedure: The patient was identified by arm band and given preoperative IV antibiotics in the holding area at [facility]. She was taken to the operating room the appropriate anesthetic monitors were attached and general endotracheal anesthesia induced with the patient in supine position. A tourniquet was applied high to the right thigh and the right lower Charney prepped and draped in usual sterile fashion from the ankle to the tourniquet. A timeout procedure was performed. The leg was wrapped with an Esmarch bandage and the tourniquet inflated to 350 mm of mercury. An anterior midline incision 15 cm in length was made from an  inch and a half above the patella to 3 cm distal to the tibial tubercle to the skin and subcutaneous tissue and there was minimal bleeding encountered. We exposed patellar tendon which had an unusual oblique tear that went from  Superior lateral proximally to inferior medial distally were was avulsed off the flare of the tibia. Using Rodgers and sharp dissection devitalized tissue was removed and it was apparent we could do a diagonal side-to-side repair reinforced with drill holes through the patella and the tibia. The first repair was a diagonal side-to-side repair done with #2 FiberWire we then placed 3 drill holes in the distal aspect of the patella allowed passes of suture through the patella and then woven through the patellar tendon repair proximally distally a transverse hole was made with a 3/32 drill bit just distal to the tibial tubercle allowing passage of 2 #2 FiberWire sore likewise woven through the patellar tendon using a running interlocking suture technique and then firmly tied down. We did slightly over reduced the patella in anticipation stretching postoperatively. After the repair had been accomplished the knee was taken to range of motion from 0-70 and there is no gapping or stretching of the repair noted. At this point a tourniquet was let down small bleeders identified and cauterized. We then closed in layers with 0 Vicryl suture and 2-0 Vicryl suture subcuticular. A dressing of Xerofoam 4 x 4 dressing sponges web roll Ace wrap and a knee immobilizer was then applied. The patient will be in the immobilizer for 6-8 weeks but will  be weightbearing as tolerated. We did infiltrate the subcutaneous tissue with 10 cc of half murmur percent Marcaine with epinephrine solution prior to placing the dressing.

## 2013-07-12 NOTE — Progress Notes (Signed)
Orthopedic Tech Progress Note Patient Details:  Jennifer [name] 1936/01/21 [id number] Patient has knee immobilizer Patient ID: Jennifer [name], female   DOB: 10/31/1935, 78 y.o.   MRN: [id number]   Jennifer [name] 07/12/2013, 1:05 PM

## 2013-07-13 LAB — CBC
HCT: 27 % — ABNORMAL LOW (ref 36.0–46.0)
Hemoglobin: 8.4 g/dL — ABNORMAL LOW (ref 12.0–15.0)
MCH: 30.1 pg (ref 26.0–34.0)
MCHC: 31.1 g/dL (ref 30.0–36.0)
MCV: 96.8 fL (ref 78.0–100.0)
Platelets: 323 10*3/uL (ref 150–400)
RBC: 2.79 MIL/uL — ABNORMAL LOW (ref 3.87–5.11)
RDW: 16.3 % — AB (ref 11.5–15.5)
WBC: 6.5 10*3/uL (ref 4.0–10.5)

## 2013-07-13 NOTE — Progress Notes (Signed)
   PATIENT ID: Jennifer [name]   1 Day Post-Op Procedure(s) (LRB): PRIMARY LEFT PATELLA TENDON REPAIR (Right)  Subjective: Moderate pain. No complaints otherwise. Resting comfortably in bed.  Objective:  Filed Vitals:   07/13/13 0524  BP: 117/82  Pulse: 87  Temp: 100.7 F (38.2 C)  Resp: 16     Dressing clean dry and intact. Wiggle toes and ankle up and down. Knee immobilizer intact.  Labs:   Recent Labs  07/12/13 1320 07/13/13 0622  HGB 10.5* 8.4*   Recent Labs  07/12/13 1320 07/13/13 0622  WBC 6.6 6.5  RBC 3.38* 2.79*  HCT 32.5* 27.0*  PLT 315 323   Recent Labs  07/12/13 0824  NA 143  K 4.6  CL 105  CO2 26  BUN 12  CREATININE 0.98  GLUCOSE 94  CALCIUM 9.8    Assessment and Plan: Postoperative day #1 status post right patellar tendon repair Continue immobilizer at all times. PT/OT Plan for discharge to SNF when ready.  VTE proph: Aspirin and SCDs

## 2013-07-13 NOTE — Evaluation (Signed)
Physical Therapy Evaluation Patient Details Name: Jennifer [name] MRN: [id number] DOB: [birth year] Today's Date: 07/13/2013   History of Present Illness  Pt admit for right patellar tendon rupture.  Recent right TK revision.    Clinical Impression  Pt admitted with above. Pt currently with functional limitations due to the deficits listed below (see PT Problem List). Pt will benefit from skilled PT to increase their independence and safety with mobility to allow discharge to the venue listed below.     Follow Up Recommendations SNF;Supervision/Assistance - 24 hour    Equipment Recommendations  Rolling walker with 5" wheels;3in1 (PT)    Recommendations for Other Services       Precautions / Restrictions Precautions Precautions: Knee Precaution Booklet Issued: No Required Braces or Orthoses: Knee Immobilizer - Right Knee Immobilizer - Right: On at all times Restrictions Weight Bearing Restrictions: Yes RLE Weight Bearing: Weight bearing as tolerated      Mobility  Bed Mobility Overal bed mobility: Needs Assistance Bed Mobility: Supine to Sit     Supine to sit: Mod assist;HOB elevated     General bed mobility comments: Pt needed assist for LEs and for elevation of trunk.  Transfers Overall transfer level: Needs assistance Equipment used: Rolling walker (2 wheeled) Transfers: Sit to/from [facility] Sit to Stand: Mod assist Stand pivot transfers: Mod assist;Min assist       General transfer comment: Mod assist for sit<>stand for power-up lift from bed with bed elevated. Verbal cues for hand and foot placement.  Able to take pvotal steps to recliner with RW and was able to shift weight with cues.  Cues for step sequence.    Ambulation/Gait                Stairs            Wheelchair Mobility    Modified Rankin (Stroke Patients Only)       Balance Overall balance assessment: Needs assistance;History of Falls Sitting-balance  support: Bilateral upper extremity supported;Feet supported Sitting balance-Leahy Scale: Poor Sitting balance - Comments: needs assist due to posterior lean Postural control: Posterior lean Standing balance support: Bilateral upper extremity supported;During functional activity Standing balance-Leahy Scale: Poor Standing balance comment: RW for UE support                             Pertinent Vitals/Pain VSS, some right knee pain but pt did not rate    Home Living Family/patient expects to be discharged to:: Skilled nursing facility (had been getting therapy after a right knee surgery) Living Arrangements: Other (Comment) (lives at [facility]) Available Help at Discharge: [place] Type of Home: [place]: Walker - 4 wheels;Grab bars - toilet      Prior Function Level of Independence: Needs assistance   Gait / Transfers Assistance Needed: Used Rollator for ambulation  ADL's / Homemaking Assistance Needed: Needed assist with showering. could dress herself        Hand Dominance   Dominant Hand: Left    Extremity/Trunk Assessment   Upper Extremity Assessment: Defer to OT evaluation           Lower Extremity Assessment: RLE deficits/detail RLE Deficits / Details: Decreased strength and ROM       Communication   Communication: No difficulties  Cognition Arousal/Alertness: Awake/alert Behavior During Therapy: WFL for tasks assessed/performed Overall Cognitive Status: Within Functional  Limits for tasks assessed Area of Impairment: Orientation;Memory;Following commands;Problem solving Orientation Level: Disoriented to;Place;Time;Situation   Memory: Decreased short-term memory Following Commands: Follows one step commands inconsistently     Problem Solving: Slow processing;Decreased initiation;Difficulty sequencing;Requires verbal cues;Requires tactile cues General Comments: [name]    General Comments       Exercises Total Joint Exercises Ankle Circles/Pumps: AROM;Both;Supine;10 reps Quad Sets: AROM;Right;Supine;10 reps      Assessment/Plan    PT Assessment Patient needs continued PT services  PT Diagnosis Difficulty walking;Abnormality of gait;Acute pain   PT Problem List Decreased activity tolerance;Decreased balance;Decreased mobility;Decreased knowledge of use of DME;Decreased safety awareness;Decreased knowledge of precautions;Pain  PT Treatment Interventions DME instruction;Gait training;Functional mobility training;Therapeutic activities;Therapeutic exercise;Balance training;Neuromuscular re-education;Modalities   PT Goals (Current goals can be found in the Care Plan section) Acute Rehab PT Goals Patient Stated Goal: Get better PT Goal Formulation: With patient Time For Goal Achievement: 07/20/13 Potential to Achieve Goals: Good    Frequency Min 5X/week   Barriers to discharge Decreased caregiver support      Co-evaluation               End of Session Equipment Utilized During Treatment: Gait belt;Right knee immobilizer;Oxygen Activity Tolerance: Patient limited by fatigue Patient left: in chair;with call bell/phone within reach Nurse Communication: Mobility status         Time: 4132-4401 PT Time Calculation (min): 19 min   Charges:   PT Evaluation $Initial PT Evaluation Tier I: 1 Procedure PT Treatments $Therapeutic Activity: 8-22 mins   PT G Codes:          Fumi Guadron [place] 2013/07/15, 1:25 PM [facility] Acute Rehabilitation [phone] (pager)

## 2013-07-14 LAB — CBC
HCT: 24.7 % — ABNORMAL LOW (ref 36.0–46.0)
HEMOGLOBIN: 8 g/dL — AB (ref 12.0–15.0)
MCH: 31 pg (ref 26.0–34.0)
MCHC: 32.4 g/dL (ref 30.0–36.0)
MCV: 95.7 fL (ref 78.0–100.0)
Platelets: 275 10*3/uL (ref 150–400)
RBC: 2.58 MIL/uL — ABNORMAL LOW (ref 3.87–5.11)
RDW: 16.1 % — ABNORMAL HIGH (ref 11.5–15.5)
WBC: 6.4 10*3/uL (ref 4.0–10.5)

## 2013-07-14 MED ORDER — ENSURE COMPLETE PO LIQD
237.0000 mL | Freq: Two times a day (BID) | ORAL | Status: DC
  Administered 2013-07-14 – 2013-07-15 (×3): 237 mL via ORAL

## 2013-07-14 NOTE — Progress Notes (Signed)
Temp.102.0, Complained "I can't catch my breath." Also complained of right flank pain. Spoke with on call PA, Grier Mitts R/T above info. Instructed to give PRN tylenol and push I.S. Cornell Barman

## 2013-07-14 NOTE — Progress Notes (Signed)
Clinical Social Work Department BRIEF PSYCHOSOCIAL ASSESSMENT 07/14/2013  Patient:  [name],[name]     Account Number:  [id number]     Independence date:  07/12/2013  Clinical Social Worker:  Rolinda Roan  Date/Time:  07/14/2013 05:11 PM  Referred by:  Physician  Date Referred:  07/12/2013 Referred for  SNF Placement   Other Referral:   Interview type:  Patient Other interview type:    PSYCHOSOCIAL DATA Living Status:  FACILITY Admitted from facility:  [facility] Level of care:  [place] Primary support name:  Ronny Bacon Ortega Primary support relationship to patient:  FAMILY Degree of support available:   Patient identified Ronny Bacon Paternostro as her granddaughter and primary support.    CURRENT CONCERNS  Other Concerns:    SOCIAL WORK ASSESSMENT / PLAN Clinical Social Worker (CSW) met with patient to discuss D/C plans. Patient report that she is from [facility] and has been there since May 9th 2015. Patient reported that she is agreeable to returning to [facility].   Assessment/plan status:  Psychosocial Support/Ongoing Assessment of Needs Other assessment/ plan:   Information/referral to community resources:    PATIENT'S/FAMILY'S RESPONSE TO PLAN OF CARE: Patient thanked CSW for visit and assisting with placement process.

## 2013-07-14 NOTE — Progress Notes (Signed)
   PATIENT ID: Jennifer [name]   2 Days Post-Op Procedure(s) (LRB): PRIMARY LEFT PATELLA TENDON REPAIR (Right)  Subjective: Patient states she is feeling much better since last night. Had a fever with sweats. Improved this am. Understands importance of IS. Reports some epicgastric pain/heartburn with eating solid food.   Objective:  Filed Vitals:   07/14/13 0929  BP: 121/51  Pulse: 73  Temp: 97.8 F (36.6 C)  Resp: 16     Dressing clean dry and intact. Wiggle toes and ankle up and down. Knee immobilizer intact.    Labs:   Recent Labs  07/12/13 1320 07/13/13 0622 07/14/13 0611  HGB 10.5* 8.4* 8.0*   Recent Labs  07/13/13 0622 07/14/13 0611  WBC 6.5 6.4  RBC 2.79* 2.58*  HCT 27.0* 24.7*  PLT 323 275   Recent Labs  07/12/13 0824  NA 143  K 4.6  CL 105  CO2 26  BUN 12  CREATININE 0.98  GLUCOSE 94  CALCIUM 9.8    Assessment and Plan: Postoperative day #2 status post right patellar tendon repair  Fever of 102 last night with some SOB, improved with elevated HOB and IS. Asymptomatic this am. Some heartburn and will start on ensure as she has no teeth/dentures and unable to chew solid food, likely contributing to symptoms Continue immobilizer at all times.  PT/OT  Plan for discharge to SNF when ready, likely Monday  VTE proph: Aspirin and SCDs

## 2013-07-14 NOTE — Progress Notes (Signed)
Poor PO intake noted, IV site infiltrated. Patient requested to increase PO intake vs restarting IV fluids. Drank 247ml of water. Able to get I.S. To 750. Knee immobilizer in place to RLE, edema noted, able to wiggle toes and warm to touch. Without complain of pain to knee. Cornell Barman

## 2013-07-14 NOTE — Progress Notes (Signed)
Temp (oral) 98.2. Patient sweaty, requiring complete linen change. Reports feeling "better". Drank another 293ml of water. I.S. Up to 1000. Heels elevated off bed. Without complaint of. Cornell Barman

## 2013-07-15 ENCOUNTER — Encounter (HOSPITAL_COMMUNITY): Payer: Self-pay | Admitting: Orthopedic Surgery

## 2013-07-15 LAB — CBC
HCT: 24.5 % — ABNORMAL LOW (ref 36.0–46.0)
Hemoglobin: 7.8 g/dL — ABNORMAL LOW (ref 12.0–15.0)
MCH: 31 pg (ref 26.0–34.0)
MCHC: 31.8 g/dL (ref 30.0–36.0)
MCV: 97.2 fL (ref 78.0–100.0)
PLATELETS: 272 10*3/uL (ref 150–400)
RBC: 2.52 MIL/uL — ABNORMAL LOW (ref 3.87–5.11)
RDW: 15.8 % — ABNORMAL HIGH (ref 11.5–15.5)
WBC: 5.8 10*3/uL (ref 4.0–10.5)

## 2013-07-15 NOTE — Care Management Note (Signed)
CARE MANAGEMENT NOTE 07/15/2013  Patient:  Birks,Garry A   Account Number:  [id number]  Date Initiated:  07/12/2013  Documentation initiated by:  [facility]  Subjective/Objective Assessment:   admitted s/p rt  TKA revision     Action/Plan:   PT/OT evals   Anticipated DC Date:  07/15/2013   Anticipated DC Plan:  SKILLED NURSING FACILITY  In-house referral  Clinical Social Worker      DC Planning Services  CM consult      Choice offered to / List presented to:             Status of service:  Completed, signed off Medicare Important Message given?  NA - LOS <3 / Initial given by admissions (If response is "NO", the following Medicare IM given date fields will be blank) Date Medicare IM given:   Date Additional Medicare IM given:    Discharge Disposition:  [place]

## 2013-07-15 NOTE — Progress Notes (Signed)
Agree with SPT.    Altus Zaino, PT [phone]  

## 2013-07-15 NOTE — Discharge Summary (Signed)
Patient ID: Jennifer [name] MRN: [id number] DOB/AGE: 76-20-1937 76 y.o.  Admit date: 07/12/2013 Discharge date: 07/15/2013  Admission Diagnoses:  Active Problems:   Patellar tendon rupture   Discharge Diagnoses:  Same  Past Medical History  Diagnosis Date  . Dementia   . Arthritis   . Blood transfusion   . Hypertension   . Psychosis   . Cardiomyopathy, nonischemic     EF initially 30% but improved with medical therapy, EF now 50-55%.  [name] LBBB (left bundle branch block)   . Paget's disease of bone 11/29/2012  . 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 Anginal pain     pt has chest pain that comes and goes Dr. Claiborne Billings heart Dr is aware  . Seizures     approximately 20 years since last seizure  . Frequency of urination     at night  . Diarrhea   . Constipation due to pain medication   . GERD (gastroesophageal reflux disease)   . Anemia   . Swelling of both ankles     Takes Lasix if needed  . Glaucoma   . Depression   . Anxiety   . TIA (transient ischemic attack)   . Thyroid disease 04/13/2006    hypothyroidism  . Osteoporosis 04/13/2006    Surgeries: Procedure(s): PRIMARY LEFT PATELLA TENDON REPAIR on 07/12/2013   Consultants:    Discharged Condition: Improved  Hospital Course: Jennifer [name] is an 78 y.o. female who was admitted 07/12/2013 for operative treatment of<principal problem not specified>. Patient has severe unremitting pain that affects sleep, daily activities, and work/hobbies. After pre-op clearance the patient was taken to the operating room on 07/12/2013 and underwent  Procedure(s): PRIMARY LEFT PATELLA TENDON REPAIR.    Patient was given perioperative antibiotics: Anti-infectives   Start     Dose/Rate Route Frequency Ordered Stop   07/12/13 0600  ceFAZolin (ANCEF) IVPB 2 g/50 mL premix     2 g 100 mL/hr over 30 Minutes Intravenous On call to O.R. 07/11/13 1422 07/12/13 1050       Patient  was given sequential compression devices, early ambulation, and chemoprophylaxis to prevent DVT.  Patient benefited maximally from hospital stay and there were no complications.    Recent vital signs: Patient Vitals for the past 24 hrs:  BP Temp Temp src Pulse Resp SpO2 Height Weight  07/15/13 0400 - - - - 18 99 % - -  07/15/13 0300 124/45 mmHg 99.1 F (37.3 C) Oral 76 18 99 % - -  07/15/13 0000 - - - - 18 99 % - -  07/14/13 2017 134/50 mmHg 98.9 F (37.2 C) Oral 79 18 100 % - -  07/14/13 2001 139/59 mmHg 98.2 F (36.8 C) Oral 76 - 100 % 5\' 2"  (1.575 m) 82.276 kg (181 lb 6.2 oz)  07/14/13 2000 - - - - 18 100 % - -  07/14/13 1408 139/59 mmHg 98.2 F (36.8 C) Oral 76 16 100 % - -     Recent laboratory studies:  Recent Labs  07/14/13 0611 07/15/13 0520  WBC 6.4 5.8  HGB 8.0* 7.8*  HCT 24.7* 24.5*  PLT 275 272     Discharge Medications:     Medication List         ARIPiprazole 5 MG tablet  Commonly known as:  ABILIFY  Take 5 mg by mouth every morning.     aspirin  EC 325 MG tablet  Take 1 tablet (325 mg total) by mouth 2 (two) times daily.     buPROPion 150 MG 24 hr tablet  Commonly known as:  WELLBUTRIN XL  Take 150 mg by mouth every morning.     carvedilol 12.5 MG tablet  Commonly known as:  COREG  Take 12.5 mg by mouth 2 (two) times daily with a meal.     diclofenac sodium 1 % Gel  Commonly known as:  VOLTAREN  Apply 2 g topically 4 (four) times daily. Rub into affected area of foot 2 to 4 times daily     fentaNYL 25 MCG/HR patch  Commonly known as:  DURAGESIC - dosed mcg/hr  Place 25 mcg onto the skin every 3 (three) days.     ferrous sulfate 325 (65 FE) MG tablet  Take 325 mg by mouth 3 (three) times a week. On Mon, Wed, and Fri.     HYDROcodone-acetaminophen 5-325 MG per tablet  Commonly known as:  NORCO/VICODIN  Take 1-2 tablets by mouth every 6 (six) hours as needed for moderate pain.     latanoprost 0.005 % ophthalmic solution  Commonly known as:   XALATAN  Place 1 drop into both eyes at bedtime.     lisinopril 10 MG tablet  Commonly known as:  PRINIVIL,ZESTRIL  Take 10 mg by mouth every morning.     loperamide 2 MG tablet  Commonly known as:  IMODIUM A-D  Take 2 mg by mouth 4 (four) times daily as needed for diarrhea or loose stools (Take 1 caplet by mouth every 2 hours as needed for losse stool).     LORazepam 0.5 MG tablet  Commonly known as:  ATIVAN  Take 0.5-1 mg by mouth 2 (two) times daily. Take 1 tablet in the morning and 2 tablets at bedtime     methocarbamol 500 MG tablet  Commonly known as:  ROBAXIN  Take 1 tablet (500 mg total) by mouth 2 (two) times daily with a meal.     multivitamin with minerals Tabs tablet  Take 1 tablet by mouth every morning.     pantoprazole 40 MG tablet  Commonly known as:  PROTONIX  Take 40 mg by mouth daily.     psyllium 0.52 G capsule  Commonly known as:  REGULOID  Take 0.52 g by mouth at bedtime.     risperiDONE 0.5 MG tablet  Commonly known as:  RISPERDAL  Take 0.25-0.5 mg by mouth at bedtime. Take 0.5 mg daily at bedtime for 1 month then start 0.25 mg tablet daily at bedtime - Starting on 05-24-13     temazepam 15 MG capsule  Commonly known as:  RESTORIL  Take 15 mg by mouth at bedtime.     vitamin B-12 50 MCG tablet  Commonly known as:  CYANOCOBALAMIN  Take 50 mcg by mouth daily.        Diagnostic Studies: Dg Knee Right Port  07/12/2013   CLINICAL DATA:  Patellar tendon rupture and pain  EXAM: PORTABLE RIGHT KNEE - 1-2 VIEW  COMPARISON:  06/19/2013  FINDINGS: Postsurgical changes are again seen. No acute abnormality is noted. No soft tissue changes are noted.  IMPRESSION: No acute abnormality seen.   Electronically Signed   By: Inez Catalina M.D.   On: 07/12/2013 17:30   Dg Knee Right Port  06/19/2013   CLINICAL DATA:  Postop right knee.  EXAM: PORTABLE RIGHT KNEE - 1-2 VIEW  FINDINGS: Patient status post total  right knee replacement with good anatomic alignment. No  acute abnormality identified. Postsurgical changes are present.  IMPRESSION: Patient status post total right knee replacement with good anatomic alignment.   Electronically Signed   By: Marcello Moores  Register   On: 06/19/2013 17:11    Disposition: 03-[facility]illed [facility]      Discharge Instructions   Call MD / Call 911    Complete by:  As directed   If you experience chest pain or shortness of breath, CALL 911 and be transported to the hospital emergency room.  If you develope a fever above 101 F, pus (white drainage) or increased drainage or redness at the wound, or calf pain, call your surgeon's office.     Change dressing    Complete by:  As directed   Change dressing on 5, then change the dressing daily with sterile 4 x 4 inch gauze dressing and apply TED hose.  You may clean the incision with alcohol prior to redressing.     Constipation Prevention    Complete by:  As directed   Drink plenty of fluids.  Prune juice may be helpful.  You may use a stool softener, such as Colace (over the counter) 100 mg twice a day.  Use MiraLax (over the counter) for constipation as needed.     Diet - low sodium heart healthy    Complete by:  As directed      Discharge instructions    Complete by:  As directed   Follow up in office with Dr. Mayer Camel in 2 weeks. Pt is to wear knee immobilizer at all times for 6 weeks.     Driving restrictions    Complete by:  As directed   No driving for 2 weeks     Increase activity slowly as tolerated    Complete by:  As directed            Follow-up Information   Follow up with Kerin Salen, MD In 2 weeks.   Specialty:  Orthopedic Surgery   Contact information:   [place] [address] [phone]        Signed: [name] 07/15/2013, 10:32 AM

## 2013-07-15 NOTE — Progress Notes (Signed)
Clinical social worker assisted with patient discharge to skilled nursing facility, [name].  CSW addressed all family questions and concerns. CSW copied chart and added all important documents. CSW also set up patient transportation with [facility]. Clinical Social Worker will sign off for now as social work intervention is no longer needed.   Taiden Raybourn, MSW, LCSWA [phone] 

## 2013-07-15 NOTE — Progress Notes (Signed)
PATIENT ID: Jennifer [name]  MRN: [id number]  DOB/AGE:  1935-05-10 / 78 y.o.  3 Days Post-Op Procedure(s) (LRB): PRIMARY LEFT PATELLA TENDON REPAIR (Right)    PROGRESS NOTE Subjective: Patient is alert, oriented, no Nausea, no Vomiting, yes passing gas, no Bowel Movement. Taking PO with mainly liquids. Denies SOB, Chest or Calf Pain. Using Incentive Spirometer, PAS in place. Ambulate WBAT with knee immobilizer Patient reports pain as 0 on 0-10 scale  .    Objective: Vital signs in last 24 hours: Filed Vitals:   07/14/13 2017 07/15/13 0000 07/15/13 0300 07/15/13 0400  BP: 134/50  124/45   Pulse: 79  76   Temp: 98.9 F (37.2 C)  99.1 F (37.3 C)   TempSrc: Oral  Oral   Resp: 18 18 18 18   Height:      Weight:      SpO2: 100% 99% 99% 99%      Intake/Output from previous day: I/O last 3 completed shifts: In: 1800 [P.O.:1800] Out: 100 [Urine:100]   Intake/Output this shift: Total I/O In: 240 [P.O.:240] Out: -    LABORATORY DATA:  Recent Labs  07/14/13 0611 07/15/13 0520  WBC 6.4 5.8  HGB 8.0* 7.8*  HCT 24.7* 24.5*  PLT 275 272    Examination: Neurologically intact ABD soft Neurovascular intact Sensation intact distally Intact pulses distally Dorsiflexion/Plantar flexion intact Incision: dressing C/D/I No cellulitis present Compartment soft}  Assessment:   3 Days Post-Op Procedure(s) (LRB): PRIMARY LEFT PATELLA TENDON REPAIR (Right) ADDITIONAL DIAGNOSIS:  Acute Blood Loss Anemia, Hypertension, Renal Insufficiency Chronic and depressive disorder, anxiety, hypothyroid  Plan: PT/OT WBAT with knee immobilizer DVT Prophylaxis:  SCDx72hrs, ASA 325 mg BID x 2 weeks DISCHARGE PLAN: Skilled Nursing Facility/Rehab [facility] when bed available DISCHARGE NEEDS: HHPT, [name], Walker and 3-in-1 comode seat     [name] 07/15/2013, 10:21 AM

## 2013-07-15 NOTE — Progress Notes (Signed)
Physical Therapy Treatment Patient Details Name: Jennifer [name] MRN: [id number] DOB: 12-16-1935 Today's Date: 07/15/2013    History of Present Illness Pt admit for right patellar tendon rupture.  Recent right TK revision.      PT Comments    Pt was able to ambulate 15 ft today with RW and mod-A for safety and VC with additonal person to follow closely with chair.  Distance limited by pt fatigue.  Pt was able to perform bed mobility with min-A to help her slide her LEs over EOB, but pt was able to perform the rest using bed rails to pull up from supine.  Mod-A for sit to/from stand to assist with balance and powering up from sitting.  Will continue to follow.   Follow Up Recommendations  SNF     Equipment Recommendations  Rolling walker with 5" wheels;3in1 (PT)    Recommendations for Other Services       Precautions / Restrictions Precautions Precautions: Knee Precaution Booklet Issued: No Required Braces or Orthoses: Knee Immobilizer - Right Knee Immobilizer - Right: On at all times Restrictions Weight Bearing Restrictions: Yes RLE Weight Bearing: Weight bearing as tolerated    Mobility  Bed Mobility Overal bed mobility: Needs Assistance Bed Mobility: Supine to Sit     Supine to sit: HOB elevated;Min assist     General bed mobility comments: Min-A to assist LEs over EOB, pt able to pull herself up using bed rails and to assist with moving her LEs to EOB.  Transfers Overall transfer level: Needs assistance Equipment used: Rolling walker (2 wheeled) Transfers: Sit to/from Stand Sit to Stand: Mod assist         General transfer comment: Mod-A to assist with power up from chair with VC for hand placement and safety using RW.  Ambulation/Gait Ambulation/Gait assistance: +2 physical assistance;Mod assist Ambulation Distance (Feet): 15 Feet Assistive device: Rolling walker (2 wheeled) Gait Pattern/deviations: Step-to pattern;Decreased step length - left;Decreased  stance time - right;Trunk flexed Gait velocity: decreased Gait velocity interpretation: Below normal speed for age/gender General Gait Details: Pt required one person for mod-A for safety with balance and VC for sequencing, extra person helpful to bring chair behind pt since she decides she needs to sit very suddenly.   Stairs            Wheelchair Mobility    Modified Rankin (Stroke Patients Only)       Balance Overall balance assessment: Needs assistance         Standing balance support: Bilateral upper extremity supported;During functional activity Standing balance-Leahy Scale: Poor Standing balance comment: Bil. UE support on RW                    Cognition Arousal/Alertness: Awake/alert Behavior During Therapy: [facility] for tasks assessed/performed Overall Cognitive Status: Within Functional Limits for tasks assessed                      Exercises Total Joint Exercises Ankle Circles/Pumps: AROM;Seated;Both;10 reps Quad Sets: AROM;Right;5 reps;Seated    General Comments        Pertinent Vitals/Pain Pt reports that her pain is better controlled today and that she needs no additional pain meds at this time.    Home Living                      Prior Function            PT Goals (current goals  can now be found in the care plan section) Acute Rehab PT Goals Patient Stated Goal: Get back to walking PT Goal Formulation: With patient Time For Goal Achievement: 07/20/13 Potential to Achieve Goals: Good Progress towards PT goals: Progressing toward goals    Frequency  Min 5X/week    PT Plan Current plan remains appropriate    Co-evaluation             End of Session Equipment Utilized During Treatment: Gait belt;Right knee immobilizer Activity Tolerance: Patient limited by fatigue Patient left: in bed;with call bell/phone within reach;with family/visitor present     Time: 8338-2505 PT Time Calculation (min): 17  min  Charges:  $Gait Training: 8-22 mins                    G Codes:      Glenmore Karl, SPT 07/15/2013, 12:33 PM

## 2013-07-15 NOTE — Anesthesia Postprocedure Evaluation (Signed)
  Anesthesia Post-op Note  Patient: Jennifer [name]  Procedure(s) Performed: Procedure(s): PRIMARY LEFT PATELLA TENDON REPAIR (Right)  Patient Location: Nursing Unit  Anesthesia Type:General  Level of Consciousness: awake and alert   Airway and Oxygen Therapy: Patient Spontanous Breathing  Post-op Pain: mild  Post-op Assessment: Post-op Vital signs reviewed, Patient's Cardiovascular Status Stable and Respiratory Function Stable  Post-op Vital Signs: Reviewed and stable  Last Vitals:  Filed Vitals:   07/15/13 0400  BP:   Pulse:   Temp:   Resp: 18    Complications: No apparent anesthesia complications

## 2013-07-16 ENCOUNTER — Non-Acute Institutional Stay (SKILLED_NURSING_FACILITY): Payer: Medicare Other | Admitting: Internal Medicine

## 2013-07-16 ENCOUNTER — Other Ambulatory Visit: Payer: Self-pay | Admitting: *Deleted

## 2013-07-16 DIAGNOSIS — I428 Other cardiomyopathies: Secondary | ICD-10-CM

## 2013-07-16 DIAGNOSIS — S86819A Strain of other muscle(s) and tendon(s) at lower leg level, unspecified leg, initial encounter: Secondary | ICD-10-CM

## 2013-07-16 DIAGNOSIS — I10 Essential (primary) hypertension: Secondary | ICD-10-CM

## 2013-07-16 DIAGNOSIS — D62 Acute posthemorrhagic anemia: Secondary | ICD-10-CM

## 2013-07-16 DIAGNOSIS — S838X9A Sprain of other specified parts of unspecified knee, initial encounter: Secondary | ICD-10-CM

## 2013-07-16 LAB — BODY FLUID CULTURE
Culture: NO GROWTH
GRAM STAIN: NONE SEEN

## 2013-07-16 MED ORDER — HYDROCODONE-ACETAMINOPHEN 5-325 MG PO TABS: ORAL_TABLET | ORAL | Status: DC

## 2013-07-16 NOTE — Telephone Encounter (Signed)
[facility] 

## 2013-07-16 NOTE — Progress Notes (Signed)
HISTORY & PHYSICAL  DATE: 07/16/2013   FACILITY: [facility]  LEVEL OF CARE: SNF (31)  ALLERGIES:  Allergies  Allergen Reactions  . Codeine Other (See Comments)    Makes her sick on her stomach.    CHIEF COMPLAINT:  Manage patelllar tendon rupture, cardiomyopathy, hypertension and acute blood loss anemia  HISTORY OF PRESENT ILLNESS: 78 year old African American female is admitted to this facility for short-term rehabilitation after her recent hospitalization for left patellar tendon rupture repair.  CARDIOMYOPATHY: The patient's cardiomyopathy remains stable. Patient denies increasing lower extremity swelling, shortness of breath, chest pain, palpitations, orthopnea or PNDs. No complications reported from the medications currently being used.  HTN: Pt 's HTN remains stable.  Denies CP, sob, DOE, headaches, dizziness or visual disturbances.  No complications from the medications currently being used.  Last BP : 139/66.  ANEMIA: The anemia has been stable. The patient denies fatigue, melena or hematochezia. No complications from the medications currently being used. Postoperatively the patient suffered an acute blood loss.  PAST MEDICAL HISTORY :  Past Medical History  Diagnosis Date  . Dementia   . Arthritis   . Blood transfusion   . Hypertension   . Psychosis   . Cardiomyopathy, nonischemic     EF initially 30% but improved with medical therapy, EF now 50-55%.  [name] LBBB (left bundle branch block)   . Paget's disease of bone 11/29/2012  . 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 Anginal pain     pt has chest pain that comes and goes Dr. Claiborne Billings heart Dr is aware  . Seizures     approximately 20 years since last seizure  . Frequency of urination     at night  . Diarrhea   . Constipation due to pain medication   . GERD (gastroesophageal reflux disease)   . Anemia   . Swelling of both  ankles     Takes Lasix if needed  . Glaucoma   . Depression   . Anxiety   . TIA (transient ischemic attack)   . Thyroid disease 04/13/2006    hypothyroidism  . Osteoporosis 04/13/2006    PAST SURGICAL HISTORY: Past Surgical History  Procedure Laterality Date  . Cholecystectomy    . Esophagogastroduodenoscopy N/A 11/30/2012    Procedure: ESOPHAGOGASTRODUODENOSCOPY (EGD);  Surgeon: Wonda Horner, MD;  [facility] ENDOSCOPY;  Service: Endoscopy;  Laterality: N/A;  . Tubal ligation    . Joint replacement Bilateral     knees  . Tonsillectomy    . Cardiac catheterization    . Foot surgery Left   . Breast lumpectomy Left     x 2  . Shoulder surgery Right   . Eye surgery      cataract removal pt unsure which eye  . Total knee revision Right 06/19/2013    Procedure: RIGHT TOTAL KNEE REVISION;  Surgeon: Kerin Salen, MD;  Location: [facility];  Service: Orthopedics;  Laterality: Right;  . Patellar tendon repair Right 07/12/2013    Procedure: PRIMARY LEFT PATELLA TENDON REPAIR;  Surgeon: Kerin Salen, MD;  Location: [place];  Service: Orthopedics;  Laterality: Right;    SOCIAL HISTORY:  reports that she has never smoked. Her smokeless tobacco use includes Snuff. She reports that she does not drink alcohol or use illicit drugs.  FAMILY HISTORY: none  CURRENT MEDICATIONS: Reviewed per  MAR/see medication list  REVIEW OF SYSTEMS:  GU: Complains of urinary frequency ,See HPI otherwise 14 point ROS is negative.  PHYSICAL EXAMINATION  VS:  See VS section  GENERAL: no acute distress, moderately obese body habitus EYES: conjunctivae normal, sclerae normal, normal eye lids MOUTH/THROAT: lips without lesions,no lesions in the mouth,tongue is without lesions,uvula elevates in midline NECK: supple, trachea midline, no neck masses, no thyroid tenderness, no thyromegaly LYMPHATICS: no LAN in the neck, no supraclavicular LAN RESPIRATORY: breathing is even & unlabored, BS CTAB CARDIAC: RRR, no  murmur,no extra heart sounds, +2 bilateral lower extremity edema GI:  ABDOMEN: abdomen soft, normal BS, no masses, no tenderness  LIVER/SPLEEN: no hepatomegaly, no splenomegaly MUSCULOSKELETAL: HEAD: normal to inspection  EXTREMITIES: LEFT UPPER EXTREMITY: Moderate range of motion, normal strength & tone RIGHT UPPER EXTREMITY:  full range of motion, normal strength & tone LEFT LOWER EXTREMITY:  full range of motion, normal strength & tone RIGHT LOWER EXTREMITY: Not tested. In immobilizer. PSYCHIATRIC: the patient is alert & oriented to person, affect & behavior appropriate  LABS/RADIOLOGY:  Labs reviewed: Basic Metabolic Panel:  Recent Labs  11/29/12 0535 06/11/13 1130 07/12/13 0824  NA 139 143 143  K 4.1 4.3 4.6  CL 105 108 105  CO2 25 24 26   GLUCOSE 87 79 94  BUN 23 19 12   CREATININE 1.31* 1.04 0.98  CALCIUM 8.8 9.9 9.8   Liver Function Tests:  Recent Labs  11/27/12 1935  AST 25  ALT 13  ALKPHOS 95  BILITOT 0.2*  PROT 7.1  ALBUMIN 3.1*    Recent Labs  11/28/12 1038  LIPASE 15   CBC:  Recent Labs  06/11/13 1130  07/13/13 0622 07/14/13 0611 07/15/13 0520  WBC 6.1  < > 6.5 6.4 5.8  NEUTROABS 3.4  --   --   --   --   HGB 11.2*  < > 8.4* 8.0* 7.8*  HCT 34.6*  < > 27.0* 24.7* 24.5*  MCV 99.4  < > 96.8 95.7 97.2  PLT 249  < > 323 275 272  < > = values in this interval not displayed.  Cardiac Enzymes:  Recent Labs  11/28/12 11/28/12 0501 11/28/12 1038  TROPONINI <0.30 <0.30 <0.30    PORTABLE RIGHT KNEE - 1-2 VIEW   FINDINGS: Patient status post total right knee replacement with good anatomic alignment. No acute abnormality identified. Postsurgical changes are present.   IMPRESSION: Patient status post total right knee replacement with good anatomic alignment. PORTABLE RIGHT KNEE - 1-2 VIEW   COMPARISON:  06/19/2013   FINDINGS: Postsurgical changes are again seen. No acute abnormality is noted. No soft tissue changes are noted.     IMPRESSION: No acute abnormality seen PORTABLE RIGHT KNEE - 1-2 VIEW   FINDINGS: Patient status post total right knee replacement with good anatomic alignment. No acute abnormality identified. Postsurgical changes are present.   IMPRESSION: Patient status post total right knee replacement with good anatomic alignment.     ASSESSMENT/PLAN:  Left patellar tendon rupture-status post repair. Continue rehabilitation. Acute blood loss anemia-check hemoglobin level Cardiomyopathy- compensated  Hypertension-well controlled Urinary frequency-new problem. Check UA culture and sensitivity. Dementia-stable Check CBC and BMP  I have reviewed patient's medical records received at admission/from hospitalization.  CPT CODE: 25852  Gayani Y Dasanayaka, [place] [phone]

## 2013-07-17 LAB — ANAEROBIC CULTURE: Gram Stain: NONE SEEN

## 2013-07-19 ENCOUNTER — Encounter: Payer: Self-pay | Admitting: *Deleted

## 2013-08-01 LAB — AFB CULTURE WITH SMEAR (NOT AT ARMC): ACID FAST SMEAR: NONE SEEN

## 2013-08-09 NOTE — OR Nursing (Signed)
Addendum to scope page 

## 2013-08-13 ENCOUNTER — Encounter: Payer: Self-pay | Admitting: Adult Health

## 2013-08-13 ENCOUNTER — Non-Acute Institutional Stay (SKILLED_NURSING_FACILITY): Payer: Medicare Other | Admitting: Adult Health

## 2013-08-13 DIAGNOSIS — F411 Generalized anxiety disorder: Secondary | ICD-10-CM

## 2013-08-13 DIAGNOSIS — S86819A Strain of other muscle(s) and tendon(s) at lower leg level, unspecified leg, initial encounter: Secondary | ICD-10-CM

## 2013-08-13 DIAGNOSIS — F0390 Unspecified dementia without behavioral disturbance: Secondary | ICD-10-CM

## 2013-08-13 DIAGNOSIS — T84098D Other mechanical complication of other internal joint prosthesis, subsequent encounter: Secondary | ICD-10-CM

## 2013-08-13 DIAGNOSIS — F3289 Other specified depressive episodes: Secondary | ICD-10-CM

## 2013-08-13 DIAGNOSIS — Z5189 Encounter for other specified aftercare: Secondary | ICD-10-CM

## 2013-08-13 DIAGNOSIS — T84099A Other mechanical complication of unspecified internal joint prosthesis, initial encounter: Secondary | ICD-10-CM

## 2013-08-13 DIAGNOSIS — K219 Gastro-esophageal reflux disease without esophagitis: Secondary | ICD-10-CM

## 2013-08-13 DIAGNOSIS — S838X9A Sprain of other specified parts of unspecified knee, initial encounter: Secondary | ICD-10-CM

## 2013-08-13 DIAGNOSIS — F32A Depression, unspecified: Secondary | ICD-10-CM

## 2013-08-13 DIAGNOSIS — S86811D Strain of other muscle(s) and tendon(s) at lower leg level, right leg, subsequent encounter: Secondary | ICD-10-CM

## 2013-08-13 DIAGNOSIS — G4700 Insomnia, unspecified: Secondary | ICD-10-CM

## 2013-08-13 DIAGNOSIS — K5900 Constipation, unspecified: Secondary | ICD-10-CM

## 2013-08-13 DIAGNOSIS — I10 Essential (primary) hypertension: Secondary | ICD-10-CM

## 2013-08-13 DIAGNOSIS — Z96659 Presence of unspecified artificial knee joint: Secondary | ICD-10-CM

## 2013-08-13 DIAGNOSIS — F329 Major depressive disorder, single episode, unspecified: Secondary | ICD-10-CM

## 2013-08-13 NOTE — Progress Notes (Signed)
Patient ID: JESI JURGENS, female   DOB: 10-Mar-1935, 78 y.o.   MRN: [id number]               PROGRESS NOTE  DATE:  08/13/13  FACILITY: Nursing Home Location: [facility]  LEVEL OF CARE: SNF (31)  Acute Visit  CHIEF COMPLAINT:  Discharge Notes  HISTORY OF PRESENT ILLNESS: This is a 78 year old female who is for discharge home with Home health PT, OT and CNA. DME: Bedside commode, wheelchair and cushion. She has been admitted to [facility] on 06/22/13 from [facility] with Mechanical complication of knee prosthesis S/P Right total knee revision. During her rehabilitation stay, she had a patellar tendon rupture S/P repair. She was re-admitted on 07/15/13. Patient was admitted to this facility for short-term rehabilitation after the patient's recent hospitalization.  Patient has completed SNF rehabilitation and therapy has cleared the patient for discharge.   REASSESSMENT OF ONGOING PROBLEM(S):  HTN: Pt 's HTN remains stable.  Denies CP, sob, DOE, pedal edema, headaches, dizziness or visual disturbances.  No complications from the medications currently being used.  Last BP :140/80  DEMENTIA: The dementia remaines stable and continues to function adequately in the current living environment with supervision.  The patient has had little changes in behavior. No complications noted from the medications presently being used.  GERD: pt's GERD is stable.  Denies ongoing heartburn, abd. Pain, nausea or vomiting.  Currently on a PPI & tolerates it without any adverse reactions.  PAST MEDICAL HISTORY : Reviewed.  No changes/see problem list  CURRENT MEDICATIONS: Reviewed per MAR/see medication list  REVIEW OF SYSTEMS:  GENERAL: no change in appetite, no fatigue, no weight changes, no fever, chills or weakness RESPIRATORY: no cough, SOB, DOE, wheezing, hemoptysis CARDIAC: no chest pain, edema or palpitations GI: no abdominal pain, diarrhea, heart burn, nausea or  vomiting  PHYSICAL EXAMINATION  GENERAL: no acute distress, normal body habitus NECK: supple, trachea midline, no neck masses, no thyroid tenderness, no thyromegaly RESPIRATORY: breathing is even & unlabored, BS CTAB CARDIAC: RRR, no murmur,no extra heart sounds, no edema GI: abdomen soft, normal BS, no masses, no tenderness, no hepatomegaly, no splenomegaly EXTREMITIES: able to move all 4 extremities; able to walk short distances with walker and assistance PSYCHIATRIC: the patient is alert & oriented to person, affect & behavior appropriate  LABS/RADIOLOGY: 07/17/13  WBC 5.8 hemoglobin 8.9 hematocrit 29.7 sodium 138 potassium 4.6 glucose 81 BUN 15 creatinine 0.9 calcium 9.4 Labs reviewed: Basic Metabolic Panel:  Recent Labs  11/29/12 0535 06/11/13 1130 07/12/13 0824  NA 139 143 143  K 4.1 4.3 4.6  CL 105 108 105  CO2 25 24 26   GLUCOSE 87 79 94  BUN 23 19 12   CREATININE 1.31* 1.04 0.98  CALCIUM 8.8 9.9 9.8   Liver Function Tests:  Recent Labs  11/27/12 1935  AST 25  ALT 13  ALKPHOS 95  BILITOT 0.2*  PROT 7.1  ALBUMIN 3.1*    Recent Labs  11/28/12 1038  LIPASE 15    CBC:  Recent Labs  06/11/13 1130  07/13/13 0622 07/14/13 0611 07/15/13 0520  WBC 6.1  < > 6.5 6.4 5.8  NEUTROABS 3.4  --   --   --   --   HGB 11.2*  < > 8.4* 8.0* 7.8*  HCT 34.6*  < > 27.0* 24.7* 24.5*  MCV 99.4  < > 96.8 95.7 97.2  PLT 249  < > 323 275 272  < > =  values in this interval not displayed.   Cardiac Enzymes:  Recent Labs  11/28/12 11/28/12 0501 11/28/12 1038  TROPONINI <0.30 <0.30 <0.30    ASSESSMENT/PLAN:  Mechanical complication of knee prostheses status post right total knee revision - for Home health PT, OT and CNA Patellar tendon rupture S/P repair - for Home health PT,OT and CNA Hypertension - well-controlled; continue Coreg and Lisinopril Anemia, acute blood loss - continue ferrous sulfate; check cbc GERD - continue Protonix Insomnia - continue  Restoril Anxiety - continue Ativan Depression - continue Wellbutrin and Abilify Constipation - stable; continue Colace and Reguloid Dementia w/ agitation - stable; continue Risperidone   I have filled out patient's discharge paperwork and written prescriptions.  Patient will receive home health PT, OT and CNA.  DME provided: Bedside commode, wheelchair and cushion  Total discharge time: Greater than 30 minutes  Discharge time involved coordination of the discharge process with social worker, nursing staff and therapy department. Medical justification for home health services/DME verified.    CPT CODE: 03474   Seth Bake - NP [facility] [phone]

## 2013-08-16 DIAGNOSIS — Z5189 Encounter for other specified aftercare: Secondary | ICD-10-CM

## 2013-08-16 DIAGNOSIS — R5383 Other fatigue: Secondary | ICD-10-CM

## 2013-08-16 DIAGNOSIS — Z4889 Encounter for other specified surgical aftercare: Secondary | ICD-10-CM

## 2013-08-16 DIAGNOSIS — R5381 Other malaise: Secondary | ICD-10-CM

## 2013-08-16 DIAGNOSIS — R269 Unspecified abnormalities of gait and mobility: Secondary | ICD-10-CM

## 2013-08-22 ENCOUNTER — Ambulatory Visit: Payer: Medicare Other | Admitting: Podiatrist

## 2013-08-28 ENCOUNTER — Encounter: Payer: Self-pay | Admitting: Cardiovascular Disease

## 2013-08-28 ENCOUNTER — Telehealth: Payer: Self-pay | Admitting: Cardiovascular Disease

## 2013-08-29 ENCOUNTER — Ambulatory Visit: Payer: Medicare Other | Admitting: Podiatrist

## 2013-09-03 NOTE — Telephone Encounter (Signed)
Closed encounter °

## 2013-09-16 ENCOUNTER — Other Ambulatory Visit: Payer: Self-pay | Admitting: Adult Health

## 2013-10-08 ENCOUNTER — Telehealth: Payer: Self-pay

## 2013-10-08 NOTE — Telephone Encounter (Signed)
Prior Authorization for Methocarbamol was received from [employer] via fax. Patient was a facility patient @ [facility] (discharged on 08/13/2013). Faxed report to facility. Facility nurse will need to review and forward to the correct person(s) for completion

## 2013-10-31 ENCOUNTER — Encounter: Payer: Self-pay | Admitting: Podiatrist

## 2013-10-31 ENCOUNTER — Encounter: Payer: Medicare Other | Admitting: Podiatrist

## 2013-11-11 NOTE — Progress Notes (Signed)
This encounter was created in error - please disregard.

## 2013-11-12 ENCOUNTER — Ambulatory Visit: Payer: Medicare Other | Admitting: Cardiovascular Disease

## 2013-12-26 ENCOUNTER — Ambulatory Visit: Payer: Medicare Other | Admitting: Cardiovascular Disease

## 2014-01-23 ENCOUNTER — Encounter: Payer: Self-pay | Admitting: Cardiovascular Disease

## 2014-01-23 ENCOUNTER — Ambulatory Visit (INDEPENDENT_AMBULATORY_CARE_PROVIDER_SITE_OTHER): Payer: Medicare Other | Admitting: Cardiovascular Disease

## 2014-01-23 VITALS — BP 140/80 | HR 63 | Ht 64.0 in | Wt 180.0 lb

## 2014-01-23 DIAGNOSIS — Z79899 Other long term (current) drug therapy: Secondary | ICD-10-CM

## 2014-01-23 DIAGNOSIS — I10 Essential (primary) hypertension: Secondary | ICD-10-CM

## 2014-01-23 DIAGNOSIS — E0590 Thyrotoxicosis, unspecified without thyrotoxic crisis or storm: Secondary | ICD-10-CM

## 2014-01-23 DIAGNOSIS — K219 Gastro-esophageal reflux disease without esophagitis: Secondary | ICD-10-CM

## 2014-01-23 DIAGNOSIS — I429 Cardiomyopathy, unspecified: Secondary | ICD-10-CM

## 2014-01-23 DIAGNOSIS — R600 Localized edema: Secondary | ICD-10-CM

## 2014-01-23 DIAGNOSIS — I428 Other cardiomyopathies: Secondary | ICD-10-CM

## 2014-01-23 LAB — CBC
HEMATOCRIT: 33.8 % — AB (ref 36.0–46.0)
Hemoglobin: 11.3 g/dL — ABNORMAL LOW (ref 12.0–15.0)
MCH: 31.1 pg (ref 26.0–34.0)
MCHC: 33.4 g/dL (ref 30.0–36.0)
MCV: 93.1 fL (ref 78.0–100.0)
MPV: 10.5 fL (ref 9.4–12.4)
Platelets: 308 10*3/uL (ref 150–400)
RBC: 3.63 MIL/uL — ABNORMAL LOW (ref 3.87–5.11)
RDW: 14.2 % (ref 11.5–15.5)
WBC: 5.6 10*3/uL (ref 4.0–10.5)

## 2014-01-23 LAB — COMPREHENSIVE METABOLIC PANEL
ALT: 10 U/L (ref 0–35)
AST: 19 U/L (ref 0–37)
Albumin: 3.8 g/dL (ref 3.5–5.2)
Alkaline Phosphatase: 110 U/L (ref 39–117)
BUN: 17 mg/dL (ref 6–23)
CO2: 29 mEq/L (ref 19–32)
Calcium: 9.8 mg/dL (ref 8.4–10.5)
Chloride: 106 mEq/L (ref 96–112)
Creat: 1 mg/dL (ref 0.50–1.10)
GLUCOSE: 86 mg/dL (ref 70–99)
Potassium: 4.5 mEq/L (ref 3.5–5.3)
Sodium: 142 mEq/L (ref 135–145)
TOTAL PROTEIN: 7.4 g/dL (ref 6.0–8.3)
Total Bilirubin: 0.4 mg/dL (ref 0.2–1.2)

## 2014-01-23 MED ORDER — FUROSEMIDE 20 MG PO TABS: 20.0000 mg | ORAL_TABLET | Freq: Every day | ORAL | Status: DC

## 2014-01-23 NOTE — Patient Instructions (Signed)
Your physician has recommended you make the following change in your medication: restart the furosemide 20 mg. A new prescription has been given to you today.,  Your physician recommends that you return for lab work. Lab slips has been provided to you. Today.  Your physician wants you to follow-up in: 6 months or sooner if needed. You will receive a reminder letter in the mail two months in advance. If you don't receive a letter, please call our office to schedule the follow-up appointment.

## 2014-01-24 LAB — TSH: TSH: 1.801 u[IU]/mL (ref 0.350–4.500)

## 2014-01-25 ENCOUNTER — Encounter: Payer: Self-pay | Admitting: Cardiovascular Disease

## 2014-01-25 DIAGNOSIS — R600 Localized edema: Secondary | ICD-10-CM | POA: Insufficient documentation

## 2014-01-25 NOTE — Progress Notes (Signed)
Patient ID: Jennifer [name], female   DOB: 1935-04-27, 78 y.o.   MRN: [id number]     HPI: Jennifer [name] is a 78 y.o. female who presents for a 10 month cardiology evaluation.   Jennifer [name] has a history of a nonischemic cardiomyopathy with an initial ejection fraction noted at cardiac catheterization in 2006 at 30%.  She had normal coronary arteries. Her ejection fraction had normalized to 50-55% on an echo Doppler study in September 2011. She has a history of chronic left bundle branch block, hypertension, and osteoporosis. A nuclear perfusion study in April 2012 showed normal perfusion with mild septal thinning noted secondary to left bundle branch block without ischemia.  She was hospitalized in October 2014 with chest pain which was felt to be atypical.  Endoscopy  showed a small hiatal hernia. In the past she had been noted to have iron deficiency. She has noticed some intermittent shoulder pain. She denies any recurrent episodes of chest pressure. An echo Doppler study at that time showed an ejection fraction in the 45-50% range. There was septal motion abnormality most likely due to her left bundle branch block. There was grade 1 diastolic dysfunction. Mild mitral annular calcification. There was moderate tricuspid regurgitation. There was mild pulmonary hypertension with estimated PA pressure 40 mm.  Jennifer [name] currently resides at a nursing home at [facility].  Since I last saw her , she underwent right knee surgery on 06/19/2013.  She has had issues with leg swelling.  She is no longer taking her furosemide.  She has GERD and this has been fairly stable on pantoprazole.  She has a history of hypertension.  She presents for evaluation.  Past Medical History  Diagnosis Date  . Dementia   . Arthritis   . Blood transfusion   . Hypertension   . Psychosis   . Cardiomyopathy, nonischemic     EF initially 30% but improved with medical therapy, EF now 50-55%.  [name] LBBB (left bundle branch  block)   . Paget's disease of bone 11/29/2012  . 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 Anginal pain     pt has chest pain that comes and goes Dr. Claiborne Billings heart Dr is aware  . Seizures     approximately 20 years since last seizure  . Frequency of urination     at night  . Diarrhea   . Constipation due to pain medication   . GERD (gastroesophageal reflux disease)   . Anemia   . Swelling of both ankles     Takes Lasix if needed  . Glaucoma   . Depression   . Anxiety   . TIA (transient ischemic attack)   . Thyroid disease 04/13/2006    hypothyroidism  . Osteoporosis 04/13/2006    Past Surgical History  Procedure Laterality Date  . Cholecystectomy    . Esophagogastroduodenoscopy N/A 11/30/2012    Procedure: ESOPHAGOGASTRODUODENOSCOPY (EGD);  Surgeon: Wonda Horner, MD;  Location: [facility] ENDOSCOPY;  Service: Endoscopy;  Laterality: N/A;  . Tubal ligation    . Joint replacement Bilateral     knees  . Tonsillectomy    . Cardiac catheterization    . Foot surgery Left   . Breast lumpectomy Left     x 2  . Shoulder surgery Right   . Eye surgery      cataract removal pt unsure which eye  . Total knee revision  Right 06/19/2013    Procedure: RIGHT TOTAL KNEE REVISION;  Surgeon: Kerin Salen, MD;  Location: [facility];  Service: Orthopedics;  Laterality: Right;  . Patellar tendon repair Right 07/12/2013    Procedure: PRIMARY LEFT PATELLA TENDON REPAIR;  Surgeon: Kerin Salen, MD;  Location: [place];  Service: Orthopedics;  Laterality: Right;    Allergies  Allergen Reactions  . Codeine Other (See Comments)    Makes her sick on her stomach.    Current Outpatient Prescriptions  Medication Sig Dispense Refill  . Alum & Mag Hydroxide-Simeth (GERI-LANTA PO) Take 30 mLs by mouth.    [name] amLODipine (NORVASC) 5 MG tablet Take 5 mg by mouth at bedtime.    . ARIPiprazole (ABILIFY) 5 MG tablet Take 5 mg by mouth every morning.    [name] aspirin EC  325 MG tablet Take 1 tablet (325 mg total) by mouth 2 (two) times daily. 30 tablet 0  . buPROPion (WELLBUTRIN XL) 150 MG 24 hr tablet Take 150 mg by mouth every morning.    . carvedilol (COREG) 12.5 MG tablet Take 12.5 mg by mouth 2 (two) times daily with a meal.    . cycloSPORINE (RESTASIS) 0.05 % ophthalmic emulsion Place 1 drop into both eyes 2 (two) times daily.    . diclofenac sodium (VOLTAREN) 1 % GEL Apply 2 g topically 4 (four) times daily. Rub into affected area of foot 2 to 4 times daily 100 g 2  . docusate sodium (COLACE) 100 MG capsule Take 100 mg by mouth 2 (two) times daily.    . fentaNYL (DURAGESIC - DOSED MCG/HR) 50 MCG/HR Place 50 mcg onto the skin every 3 (three) days.    . ferrous sulfate 325 (65 FE) MG tablet Take 325 mg by mouth 3 (three) times a week. On Mon, Wed, and Fri.    . guaifenesin (ROBITUSSIN) 100 MG/5ML syrup Take 200 mg by mouth every 6 (six) hours as needed for cough.    [name] HYDROcodone-acetaminophen (NORCO/VICODIN) 5-325 MG per tablet Take one tablet by mouth every 6 hours as needed for mild to moderate pain; Take two tablets by mouth every 6 hours as needed for moderate to severe pain 240 tablet 0  . latanoprost (XALATAN) 0.005 % ophthalmic solution Place 1 drop into both eyes at bedtime.    [name] lisinopril (PRINIVIL,ZESTRIL) 10 MG tablet Take 10 mg by mouth every morning.    . loperamide (IMODIUM A-D) 2 MG tablet Take 2 mg by mouth 4 (four) times daily as needed for diarrhea or loose stools (Take 1 caplet by mouth every 2 hours as needed for losse stool).    . LORazepam (ATIVAN) 0.5 MG tablet Take 0.5-1 mg by mouth 2 (two) times daily. Take 1 tablet in the morning and 2 tablets at bedtime    . magnesium hydroxide (MILK OF MAGNESIA) 400 MG/5ML suspension Take 5 mLs by mouth daily as needed for mild constipation.    . methocarbamol (ROBAXIN) 500 MG tablet Take 1 tablet (500 mg total) by mouth 2 (two) times daily with a meal. 60 tablet 0  . Multiple Vitamin (MULTIVITAMIN  WITH MINERALS) TABS tablet Take 1 tablet by mouth every morning.    . Multiple Vitamins-Minerals (CEROVITE ADVANCED FORMULA PO) Take by mouth daily.    . pantoprazole (PROTONIX) 40 MG tablet Take 40 mg by mouth daily.    . promethazine (PHENERGAN) 25 MG tablet Take 25 mg by mouth every 8 (eight) hours as needed for nausea or vomiting.    [name]  psyllium (REGULOID) 0.52 G capsule Take 0.52 g by mouth at bedtime.    . risperiDONE (RISPERDAL) 0.5 MG tablet Take 0.25-0.5 mg by mouth at bedtime. Take 0.5 mg daily at bedtime for 1 month then start 0.25 mg tablet daily at bedtime - Starting on 05-24-13    . temazepam (RESTORIL) 15 MG capsule Take 15 mg by mouth at bedtime.    . vitamin B-12 (CYANOCOBALAMIN) 50 MCG tablet Take 50 mcg by mouth daily.    . furosemide (LASIX) 20 MG tablet Take 1 tablet (20 mg total) by mouth daily. 30 tablet 6   No current facility-administered medications for this visit.    History   Social History  . Marital Status: Divorced    Spouse Name: N/A    Number of Children: N/A  . Years of Education: N/A   Occupational History  . Not on file.   Social History Main Topics  . Smoking status: Never Smoker   . Smokeless tobacco: Current User    Types: Snuff  . Alcohol Use: No  . Drug Use: No  . Sexual Activity: Not on file   Other Topics Concern  . Not on file   Social History Narrative    History reviewed. No pertinent family history.  ROS General: Negative; No fevers, chills, or night sweats;  HEENT: Negative; No changes in vision or hearing, sinus congestion, difficulty swallowing Pulmonary: Negative; No cough, wheezing, shortness of breath, hemoptysis Cardiovascular: Negative; No chest pain, presyncope, syncope, palpitations Occasional leg swelling GI: Negative; No nausea, vomiting, diarrhea, or abdominal pain GU: Negative; No dysuria, hematuria, or difficulty voiding Musculoskeletal: Negative; no myalgias, joint pain, or weakness Hematologic/Oncology:  Negative; no easy bruising, bleeding Endocrine: Negative; no heat/cold intolerance; no diabetes Neuro: Negative; no changes in balance, headaches Skin: Negative; No rashes or skin lesions Psychiatric: Negative; No behavioral problems, depression Sleep: Positive for history of restless legs; No snoring, daytime sleepiness, hypersomnolence, bruxism,  hypnogognic hallucinations, no cataplexy Other comprehensive 14 point system review is negative.   PE BP 140/80 mmHg  Pulse 63  Ht 5\' 4"  (1.626 m)  Wt 180 lb (81.647 kg)  BMI 30.88 kg/m2  General: Alert, oriented, no distress.  Skin: normal turgor, no rashes HEENT: Normocephalic, atraumatic. Pupils round and reactive; sclera anicteric;no lid lag. Extraocular muscles intact. Nose without nasal septal hypertrophy Mouth/Parynx benign; Mallinpatti scale 2 Neck: No JVD, no carotid bruits; normal carotid upstroke Lungs: clear to ausculatation and percussion; no wheezing or rales Chest wall: no tenderness to palpitation Heart: RRR, s1 s2 normal 1/6 systolic murmur; no diastolic murmur.  No rubs thrills or heaves. Abdomen: Mild central adiposity soft, nontender; no hepatosplenomehaly, BS+; abdominal aorta nontender and not dilated by palpation. Back: no CVA tenderness Pulses 2+ Extremities: Mild ankle edema; no clubbing cyanosis, Homan's sign negative  Neurologic: grossly nonfocal; cranial nerves grossly normal. Psychologic: normal affect and mood.  ECG (independently read by me, and (: Normal sinus rhythm at 63 bpm.  Left bundle branch block with repolarization changes.  PR interval 170 ms.  QTc interval 466 ms.  February 2015 ECG (independently read by me): Sinus rhythm with left bundle-branch block and associated repolarization changes.  LABS:  BMET    Component Value Date/Time   NA 142 01/23/2014 1140   K 4.5 01/23/2014 1140   CL 106 01/23/2014 1140   CO2 29 01/23/2014 1140   GLUCOSE 86 01/23/2014 1140   BUN 17 01/23/2014 1140    CREATININE 1.00 01/23/2014 1140   CREATININE  0.98 07/12/2013 0824   CALCIUM 9.8 01/23/2014 1140   GFRNONAA 55* 07/12/2013 0824   GFRAA 64* 07/12/2013 0824     Hepatic Function Panel     Component Value Date/Time   PROT 7.4 01/23/2014 1140   ALBUMIN 3.8 01/23/2014 1140   AST 19 01/23/2014 1140   ALT 10 01/23/2014 1140   ALKPHOS 110 01/23/2014 1140   BILITOT 0.4 01/23/2014 1140     CBC    Component Value Date/Time   WBC 5.6 01/23/2014 1140   RBC 3.63* 01/23/2014 1140   RBC 3.22* 11/28/2012 1038   HGB 11.3* 01/23/2014 1140   HCT 33.8* 01/23/2014 1140   PLT 308 01/23/2014 1140   MCV 93.1 01/23/2014 1140   MCH 31.1 01/23/2014 1140   MCHC 33.4 01/23/2014 1140   RDW 14.2 01/23/2014 1140   LYMPHSABS 2.1 06/11/2013 1130   MONOABS 0.4 06/11/2013 1130   EOSABS 0.1 06/11/2013 1130   BASOSABS 0.1 06/11/2013 1130     BNP    Component Value Date/Time   PROBNP 193.6* 11/27/2012 1935    Lipid Panel  No results found for: CHOL   RADIOLOGY: No results found.    ASSESSMENT AND PLAN:  Ms. Tannis Burstein is a 78 year old female with a remote history of a documented nonischemic cardiomyopathy with initial ejection fraction approximately 30%. LV function has subsequently improved with medical therapy and now has been in the range of 45%.  She underwent right knee surgery without cardiovascular difficulty.  She has been documented have normal coronary arteries at catheterization in 2006 and her last nuclear perfusion study in 2012 continued to show normal perfusion with probable left bundle-branch block mild artifact.  I have recommended that she resume furosemide 20 mg daily in light of her swelling.  Blood pressure today was borderline elevated at 140/80 on her current regimen of carvedilol 12.5 mg twice a day, amlodipine 5 mg and  lisinopril 10 mg in the morning.  She is not having any GERD symptoms on pantoprazole.  Laboratory will be checked and I will contact her regarding the  results.  Adjustments to her medical regimen will be made if necessary.  I will see her in 6 months for cardiology reevaluation or sooner if problems arise.  Time spent: 25 minutes  Troy Sine, MD, [facility]  01/25/2014 12:35 PM

## 2014-02-04 ENCOUNTER — Encounter: Payer: Self-pay | Admitting: *Deleted

## 2014-07-04 ENCOUNTER — Encounter: Payer: Self-pay | Admitting: Cardiovascular Disease

## 2015-02-03 IMAGING — CR DG KNEE 1-2V PORT*R*
2 series · 2 of 2 positions shown · non-contrast
Comparison: none

CLINICAL DATA: Postop right knee.

EXAM:
PORTABLE RIGHT KNEE - 1-2 VIEW

[AP]
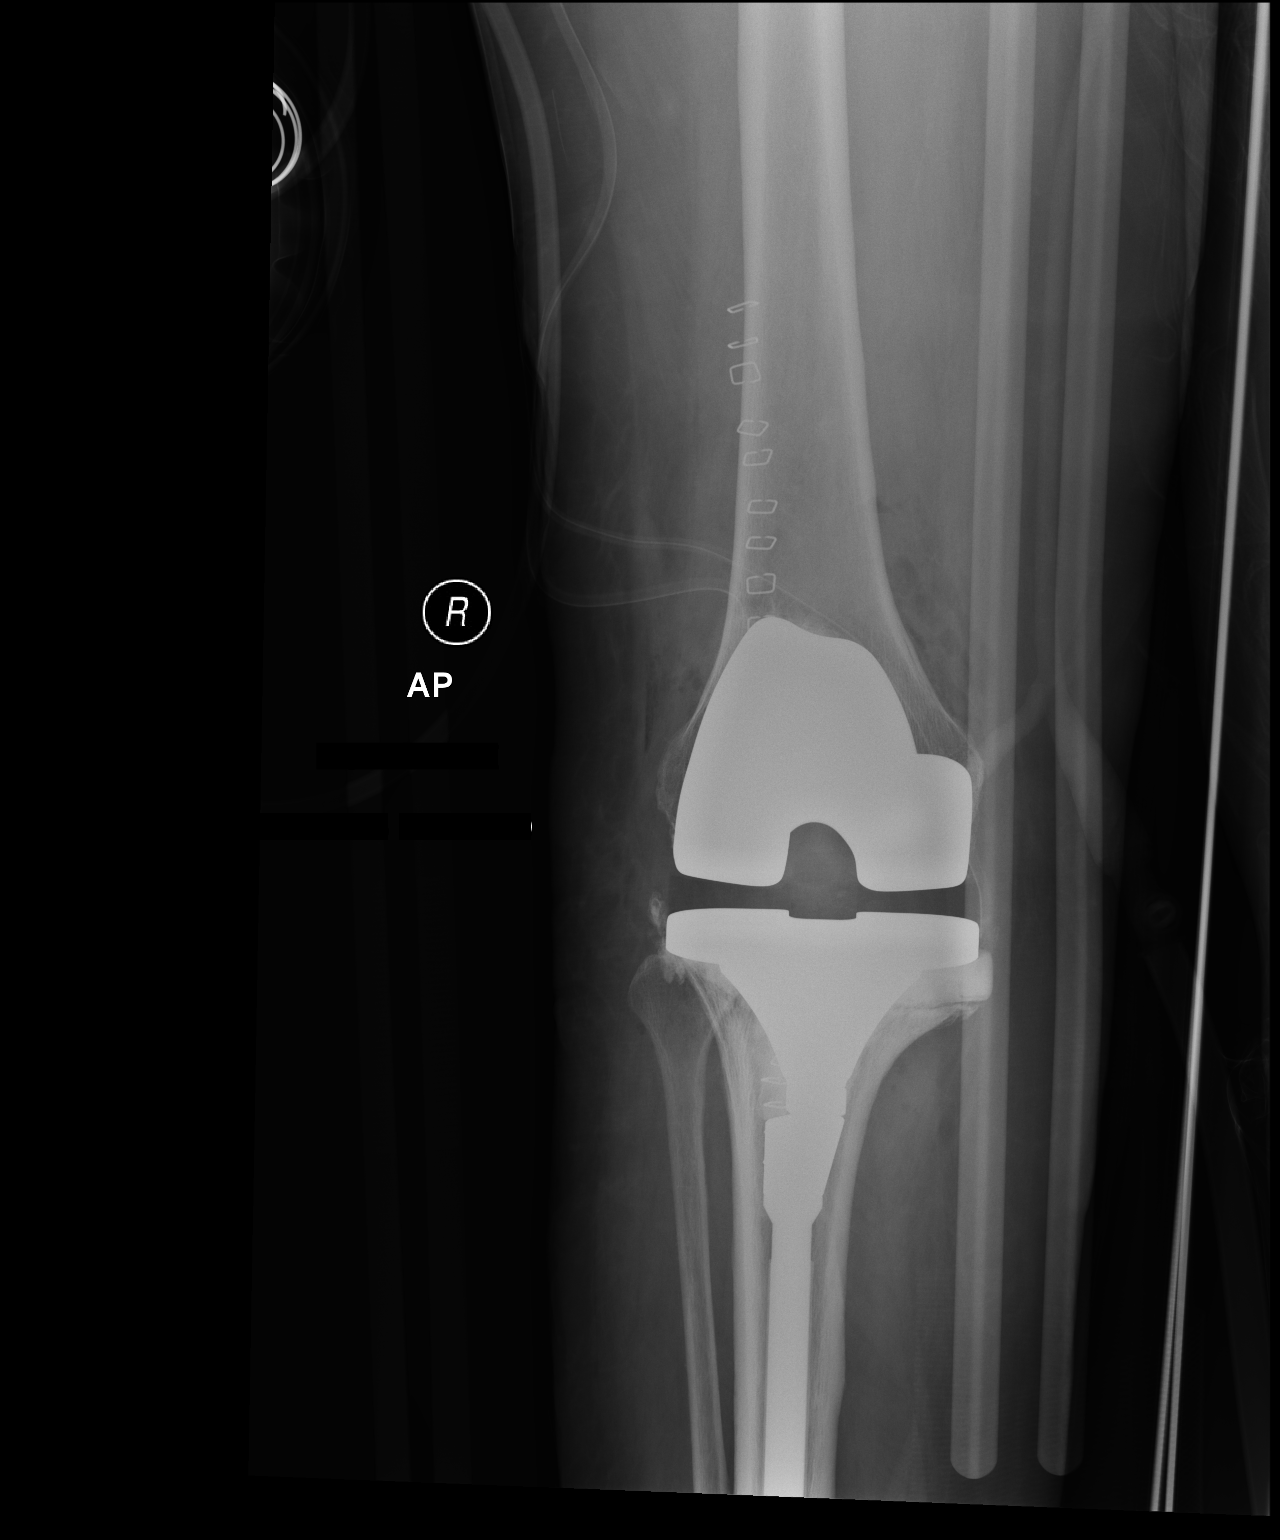

[xtable lateral]
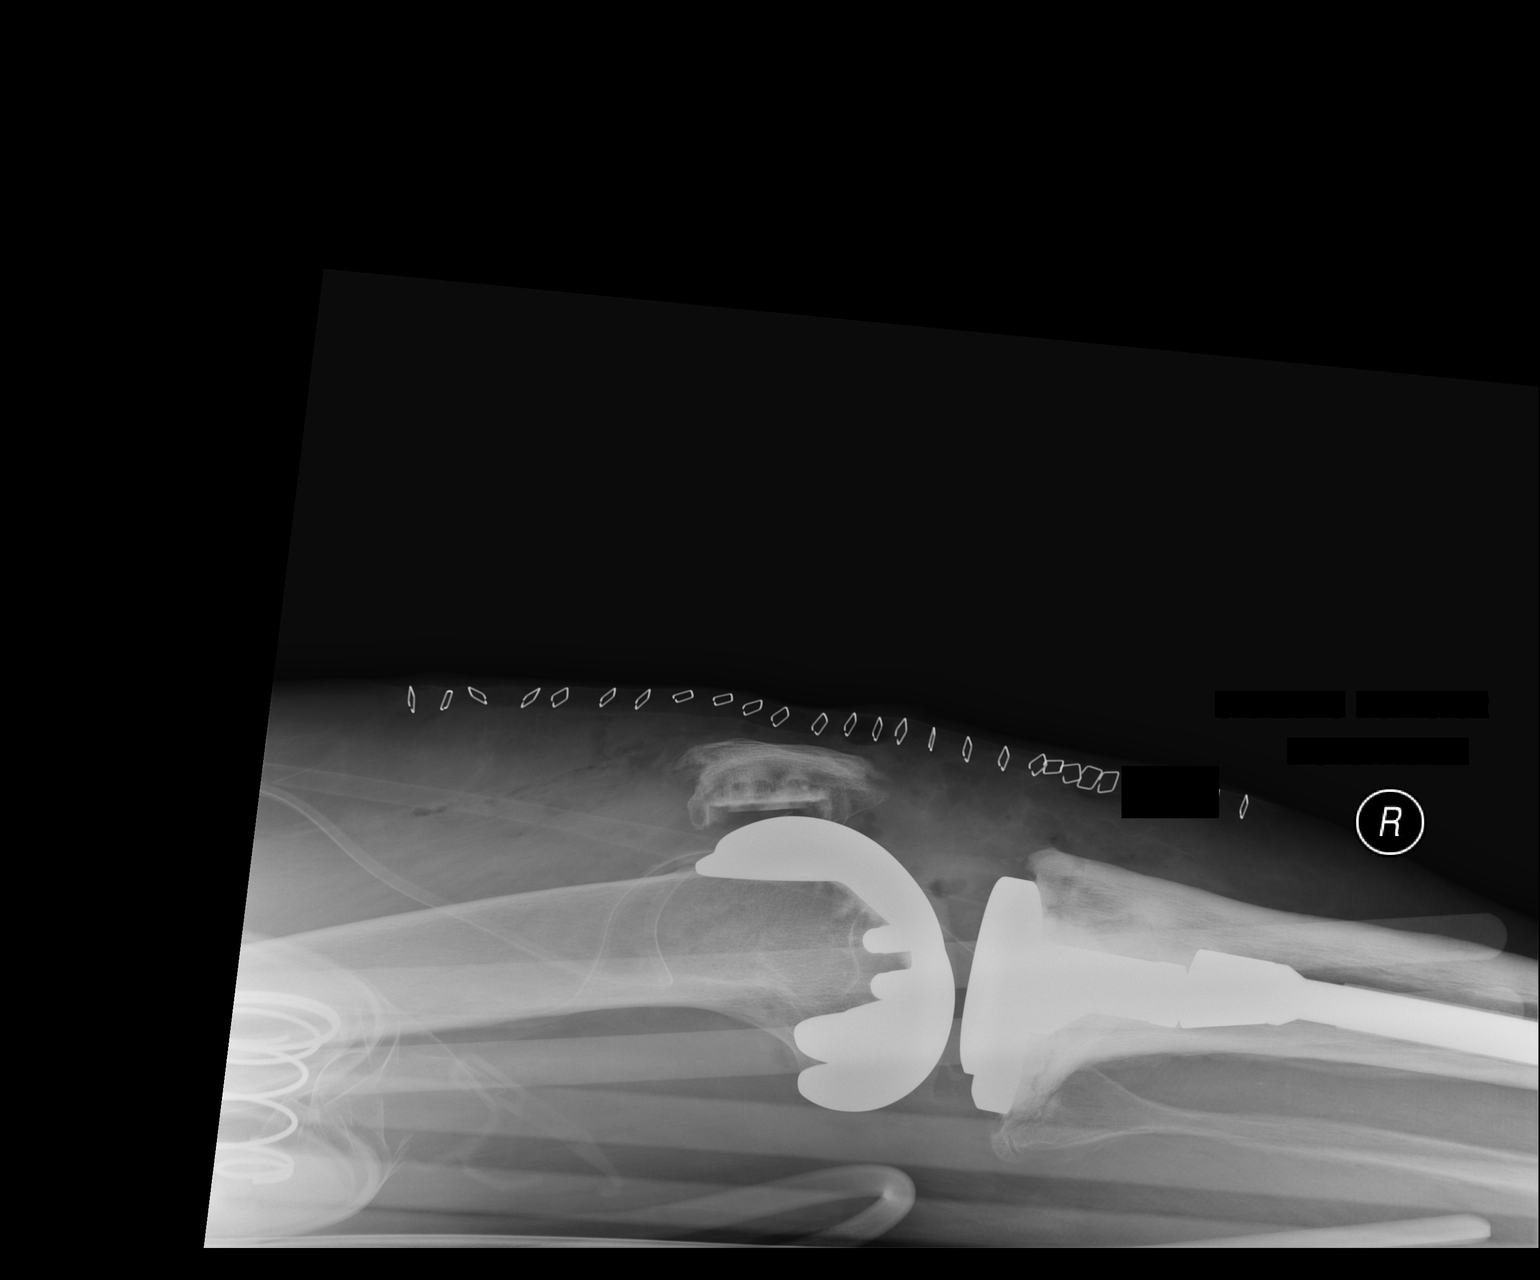

[2 of 2 positions shown; findings below may reference images not displayed]

FINDINGS: Patient status post total right knee replacement with good anatomic
alignment. No acute abnormality identified. Postsurgical changes are
present.
IMPRESSION: Patient status post total right knee replacement with good anatomic
alignment.

## 2016-03-10 ENCOUNTER — Ambulatory Visit (INDEPENDENT_AMBULATORY_CARE_PROVIDER_SITE_OTHER): Payer: Medicare Other

## 2016-03-10 ENCOUNTER — Ambulatory Visit (INDEPENDENT_AMBULATORY_CARE_PROVIDER_SITE_OTHER): Payer: Medicare Other | Admitting: Podiatry

## 2016-03-10 DIAGNOSIS — L89609 Pressure ulcer of unspecified heel, unspecified stage: Secondary | ICD-10-CM

## 2016-03-10 DIAGNOSIS — M722 Plantar fascial fibromatosis: Secondary | ICD-10-CM | POA: Diagnosis not present

## 2016-03-10 DIAGNOSIS — M79672 Pain in left foot: Secondary | ICD-10-CM | POA: Diagnosis not present

## 2016-03-10 DIAGNOSIS — M79671 Pain in right foot: Secondary | ICD-10-CM | POA: Diagnosis not present

## 2016-03-10 MED ORDER — DICLOFENAC SODIUM 1 % TD GEL: 2.0000 g | Freq: Four times a day (QID) | TRANSDERMAL | 2 refills | Status: AC

## 2016-03-15 ENCOUNTER — Telehealth: Payer: Self-pay | Admitting: *Deleted

## 2016-03-15 NOTE — Progress Notes (Signed)
Subjective: 81 year old female presents the office today for concerns that heel pain to both of her heels. She states the areas painful mostly night when she is laying in bed in her heels are touching the bed. She does not walk. She denies any recent injury or trauma. She presents a history voltaren gel which has helped. She's had no other treatment. Denies any systemic complaints such as fevers, chills, nausea, vomiting. No acute changes since last appointment, and no other complaints at this time.   Objective: AAO x3, NAD DP/PT pulses palpable bilaterally, CRT less than 3 seconds There is tenderness to the posterior heels.There is no erythema or signs of skin breakdown. There is no swelling. There is no open sores. There is no pain with lateral compression of the calcaneus. There is no other areas of tenderness identified bilaterally. No open lesions or pre-ulcerative lesions.  No pain with calf compression, swelling, warmth, erythema  Assessment: Bilateralposterior heel pain results from pressure  Plan: -All treatment options discussed with the patient including all alternatives, risks, complications.  -I dispensed offloading foam boots to wear while in bed to limit the pressure to her heels. She can also continue to use Voltaren gel to the heels to help with the pain. I discussed further stretching exercises that helped keep the motion to her feet and help the tightness of the Achilles tendon that she does have equinus. These exercises things that she can do while laying in bed. -Monitor for any skin breakdown. -Patient encouraged to call the office with any questions, concerns, change in symptoms.   Celesta Gentile, DPM

## 2016-03-15 NOTE — Telephone Encounter (Addendum)
[place] states pt complains cream prescribed at last visit burning her feet so bad she can't use it. Pt is requesting a diffeerent cream.03/16/2016-I spoke with [place], I informed her of Dr. Leigh Aurora orders for pt. She states that an order would need to be sent to d/c the Diclofenac 1% gel, and order for the new cream faxed to [phone]. Faxed orders to [employer]. Faxed orderes to d/c Diclofenac 1% gel and begin "Authorized Substitute" Pain Cream Formulation once received.

## 2016-03-15 NOTE — Telephone Encounter (Signed)
Can you please order and antiinflammatory cream through [facility].

## 2016-03-16 MED ORDER — NONFORMULARY OR COMPOUNDED ITEM: 2 refills | Status: AC

## 2016-04-28 ENCOUNTER — Ambulatory Visit (INDEPENDENT_AMBULATORY_CARE_PROVIDER_SITE_OTHER): Payer: Medicare Other | Admitting: Podiatry

## 2016-04-28 ENCOUNTER — Encounter: Payer: Self-pay | Admitting: Podiatry

## 2016-04-28 DIAGNOSIS — M79675 Pain in left toe(s): Secondary | ICD-10-CM

## 2016-04-28 DIAGNOSIS — M79672 Pain in left foot: Secondary | ICD-10-CM | POA: Diagnosis not present

## 2016-04-28 DIAGNOSIS — B351 Tinea unguium: Secondary | ICD-10-CM

## 2016-04-28 DIAGNOSIS — M79674 Pain in right toe(s): Secondary | ICD-10-CM

## 2016-04-28 DIAGNOSIS — M79671 Pain in right foot: Secondary | ICD-10-CM | POA: Diagnosis not present

## 2016-04-28 NOTE — Patient Instructions (Signed)
I cut your left big toenail to see if this will help with the pain. I think the pain is coming from the toenail. If your pain has not resolved in the next couple of weeks, please call the office for follow-up. Continue with the heel pillows to keep pressure off of your heels at night as well.

## 2016-05-02 NOTE — Progress Notes (Signed)
Subjective: 81 year old female presents the office today. Evaluation of bilateral heel pain. She states the heel pain has resolved. She is using the heel pillows which is been helping quite a bit. She de sores. She states the right toe has started to hurt but denies any redness, drainage or any swelling. This area is painful pressure.  Denies any systemic complaints such as fevers, chills, nausea, vomiting. No acute changes since last appointment, and no other complaints at this time.   Objective: AAO x3, NAD DP/PT pulses palpable bilaterally, CRT less than 3 seconds There is no tenderness the heels today and there is no skin breakdown erythema. No open sores identified. Right hallux toenail does appear to be hypertrophic, dystrophic, discolored, brittleness tenderness of the toenail. There is no surrounding redness or drainage. There are no clinical signs of infection. No open lesions or pre-ulcerative lesions.  No pain with calf compression, swelling, warmth, erythema  Assessment: Results show pain with offloading, right hallux onychodystrophy likely onychomycosis which is symptomatic  Plan: -All treatment options discussed with the patient including all alternatives, risks, complications.  -Continue offloading pillows of the heels. Monitor for any skin breakdown. -Right hallux toenails debrided without complications or bleeding. After debridement of resolution of pain. Monitor for infection. -RTC as scheduled or sooner if needed. Call any questions or concerns meantime.  Celesta Gentile, DPM

## 2016-05-26 ENCOUNTER — Ambulatory Visit (INDEPENDENT_AMBULATORY_CARE_PROVIDER_SITE_OTHER): Payer: Medicare Other | Admitting: Nurse Practitioner

## 2016-05-26 ENCOUNTER — Encounter: Payer: Self-pay | Admitting: Nurse Practitioner

## 2016-05-26 VITALS — BP 128/66 | HR 66 | Ht 64.0 in | Wt 204.0 lb

## 2016-05-26 DIAGNOSIS — I10 Essential (primary) hypertension: Secondary | ICD-10-CM | POA: Diagnosis not present

## 2016-05-26 DIAGNOSIS — R0989 Other specified symptoms and signs involving the circulatory and respiratory systems: Secondary | ICD-10-CM

## 2016-05-26 DIAGNOSIS — R079 Chest pain, unspecified: Secondary | ICD-10-CM | POA: Diagnosis not present

## 2016-05-26 DIAGNOSIS — I428 Other cardiomyopathies: Secondary | ICD-10-CM

## 2016-05-26 NOTE — Progress Notes (Signed)
Office Visit    Patient Name: Jennifer [name] Date of Encounter: 05/26/2016  Primary Care Provider:  Reymundo Poll, MD Primary Cardiologist:  Corky Downs, MD   Chief Complaint    81 year old female with a prior history of nonischemic cardiomyopathy with subsequent normalization of LV function, normal coronary arteries, left bundle branch block, hypertension, atypical chest pain, hiatal hernia, and osteoporosis who presents for follow-up r/t recent episodes of c/p.  Past Medical History    Past Medical History:  Diagnosis Date  . Anemia   . Anxiety   . Arthritis   . Atypical chest pain    a. 2006 Cath: nl cors;  b. 05/2010 MV: no ischemia;  c. 05/2013 MV: nl EF, no ischemia.  . Blood transfusion   . Cardiomyopathy, nonischemic ([place])    a. 2006 EF initially 30%;  b. 10/2009 Echo EF now 50-55%; c. 11/2012 Echo: EF 45-50% w/ septal motion abnormality (LBBB), gr1 DD, mod TR, PASP 19mmHg; d. 05/2013 MV: Nl EF by SPECT.  . Constipation due to pain medication   . Dementia   . Depression   . Diarrhea   . Frequency of urination    at night  . GERD (gastroesophageal reflux disease)   . Glaucoma   . Hypertension   . LBBB (left bundle branch block)   . Osteoporosis 04/13/2006  . Paget's disease of bone 11/29/2012  . Psychosis   . Renal disorder    only has one kidney; Right kidney stopped working after last child was born  . Seizures ([place])    approximately 20 years since last seizure  . Swelling of both ankles    Takes Lasix if needed  . Thyroid disease 04/13/2006   hypothyroidism  . TIA (transient ischemic attack)    Past Surgical History:  Procedure Laterality Date  . BREAST LUMPECTOMY Left    x 2  . CARDIAC CATHETERIZATION    . CHOLECYSTECTOMY    . ESOPHAGOGASTRODUODENOSCOPY N/A 11/30/2012   Procedure: ESOPHAGOGASTRODUODENOSCOPY (EGD);  Surgeon: Wonda Horner, MD;  Location: [facility] ENDOSCOPY;  Service: Endoscopy;  Laterality: N/A;  . EYE SURGERY     cataract removal pt unsure  which eye  . FOOT SURGERY Left   . JOINT REPLACEMENT Bilateral    knees  . NEPHRECTOMY  1963  . PATELLAR TENDON REPAIR Right 07/12/2013   Procedure: PRIMARY LEFT PATELLA TENDON REPAIR;  Surgeon: Kerin Salen, MD;  Location: [facility];  Service: Orthopedics;  Laterality: Right;  . SHOULDER SURGERY Right   . TONSILLECTOMY    . TOTAL KNEE REVISION Right 06/19/2013   Procedure: RIGHT TOTAL KNEE REVISION;  Surgeon: Kerin Salen, MD;  Location: [facility];  Service: Orthopedics;  Laterality: Right;  . TUBAL LIGATION      Allergies  Allergies  Allergen Reactions  . Codeine Other (See Comments)    Makes her sick on her stomach.    History of Present Illness    81 year old female with the above complex past medical history including nonischemic cardiomyopathy, which was first diagnosed at the time of heart catheterization in 2006. Coronary arteries were normal at that time. Follow-up echocardiography in 2011 showed normalization of LV function, while her last echo in October 2014 showed an EF of 45-50% with septal motion abnormality most likely consistent with left bundle branch block. Stress test was undertaken in the setting of chest discomfort in April 2015, and this showed no evidence of ischemia and normal LV function. Other history includes hypertension,  left bundle branch block, hiatal hernia, and osteoporosis. She is status post bilateral knee replacements in the past.  She was last seen in clinic in December 2015.She continues to stay at an assisted living facility. All of her medications and meals are provided for her but there is not necessarily skilled nursing. She is more or less wheelchair bound at this point. She had problems with her right knee prosthesis which required repeat surgeries and she is not walking at all at this point. Though she had done relatively well from a cardiac standpoint for the past 3 years, about 3 weeks ago, she awoke in the middle of the night with left arm weakness  and numbness, and also garbled speech. She also noted chest discomfort. She was evaluated by staff but was not taken to the hospital. After several hours of symptoms, she says she was treated with aspirin and symptoms resolved within about 8 hours. She has had no recurrence of left arm weakness or alteration in speech and has had no residual symptoms either. She has since been evaluated by the house physician without further workup.  With regards to chest pain, this has occurred maybe about once a week over the past 3 weeks. Subsequent to that initial episode, episodes have lasted just a few minutes, and resolved spontaneously. There are no associated symptoms. She denies palpitations, PND, orthopnea, dizziness, syncope, edema, dyspnea, or early satiety.  Home Medications    Prior to Admission medications   Medication Sig Start Date End Date Taking? Authorizing Provider  amLODipine (NORVASC) 5 MG tablet Take 5 mg by mouth at bedtime.    Historical Provider, MD  ARIPiprazole (ABILIFY) 5 MG tablet Take 5 mg by mouth every morning.    Historical Provider, MD  aspirin EC 81 MG tablet Take 1 tablet  daily.      buPROPion (WELLBUTRIN XL) 150 MG 24 hr tablet Take 150 mg by mouth every morning.    Historical Provider, MD  carvedilol (COREG) 12.5 MG tablet Take 12.5 mg by mouth 2 (two) times daily with a meal.    Historical Provider, MD  cycloSPORINE (RESTASIS) 0.05 % ophthalmic emulsion Place 1 drop into both eyes 2 (two) times daily.    Historical Provider, MD  diclofenac sodium (VOLTAREN) 1 % GEL Apply 2 g topically 4 (four) times daily. Rub into affected area of foot 2 to 4 times daily 03/10/16   Trula Slade, DPM  fentaNYL (DURAGESIC - DOSED MCG/HR) 50 MCG/HR Place 50 mcg onto the skin every 3 (three) days.    Historical Provider, MD  ferrous sulfate 325 (65 FE) MG tablet Take 325 mg by mouth 3 (three) times a week. On Mon, Wed, and Fri.    Historical Provider, MD  furosemide (LASIX) 20 MG tablet  Take 1 tablet (20 mg total) by mouth daily. 01/23/14   Troy Sine, MD  Gabapentin 300 mg  1 tablet twice a day     Historical Provider, MD  HYDROcodone-acetaminophen (NORCO/VICODIN) 5-325 MG per tablet Take one tablet by mouth every 6 hours as needed for mild to moderate pain; Take two tablets by mouth every 6 hours as needed for moderate to severe pain 07/16/13   Blanchie Serve, MD  latanoprost (XALATAN) 0.005 % ophthalmic solution Place 1 drop into both eyes at bedtime.    Historical Provider, MD  Imdur 30 mg  1 tablet by mouth daily     Historical Provider, MD  loperamide (IMODIUM A-D) 2 MG tablet Take  2 mg by mouth 4 (four) times daily as needed for diarrhea or loose stools (Take 1 caplet by mouth every 2 hours as needed for losse stool).    Historical Provider, MD  LORazepam (ATIVAN) 0.5 MG tablet Take 0.5-1 mg by mouth 2 (two) times daily. Take 1 tablet in the morning and 2 tablets at bedtime    Historical Provider, MD  Losartan 25 mg  1 tablet daily     Historical Provider, MD  Multiple Vitamin (MULTIVITAMIN WITH MINERALS) TABS tablet Take 1 tablet by mouth every morning.    Historical Provider, MD  pantoprazole (PROTONIX) 40 MG tablet Take 40 mg by mouth daily.    Historical Provider, MD  vitamin B-12 (CYANOCOBALAMIN) 50 MCG tablet Take 50 mcg by mouth daily.    Historical Provider, MD    Review of Systems    As above, about 3 weeks ago, patient had an episode of chest pain and also left arm numbness/weakness, and garbled speech. Symptoms lasted about 8 hours and resolved after aspirin.  She has chronic bilateral knee pain and is wheelchair bound. She denies dyspnea, palpitations, PND, orthopnea, dizziness, syncope, edema, or early satiety. All other systems reviewed and are otherwise negative except as noted above.  Physical Exam    VS:  BP 128/66   Pulse 66   Ht 5\' 4"  (1.626 m)   Wt 204 lb (92.5 kg)   BMI 35.02 kg/m  , BMI Body mass index is 35.02 kg/m. GEN: Obese, no acute  distress.  HEENT: normal.  Neck: Supple, soft right carotid bruit with prominent/pulsatile upstroke. No masses. Cardiac: RRR, no murmurs, rubs, or gallops. No clubbing, cyanosis, edema.  Radials/DP/PT 2+ and equal bilaterally.  Respiratory:  Respirations regular and unlabored, clear to auscultation bilaterally. GI: Soft, nontender, nondistended, BS + x 4. MS: no deformity or atrophy. Skin: warm and dry, no rash. Neuro:  Strength and sensation are intact. Psych: Normal affect.  Accessory Clinical Findings    ECG - Regular sinus rhythm, 66, left bundle branch block, no acute changes.  Assessment & Plan    1.  Chest pain: Patient has a prior history of chest pain with normal coronary arteries on heart catheterization in 2006 with negative Myoview's in 2012 and again in April 2015. Over the past 3 weeks, she has been having intermittent chest discomfort occurring at rest. The initial episode woke her up at night and was also associated with left arm weakness/numbness and garbled speech. The latter symptoms are concerning for TIA and I will be obtaining a carotid ultrasound. With regards to chest pain it has been 3 years since her last stress test, and I will arrange for a repeat Lexiscan Myoview. She remains on low-dose aspirin, beta blocker, ARB, and long-acting nitrate therapy.  2.? TIA/right carotid bruit: About 3 weeks ago, patient was awakened with chest pain but also noted garbled speech and left arm weakness and numbness. Symptoms resolved within about 8 hours. She has had no recurrence of symptoms and no apparent sequelae. She is on aspirin therapy. She does have a soft right carotid bruit. I will obtain a carotid ultrasound to rule out significant disease.  3. Essential hypertension: Blood pressure well controlled on beta blocker, calcium channel blocker, ARB, nitrate, and diuretic therapy.  4. Nonischemic cardiomyopathy: She appears to be euvolemic on exam. No recent significant dyspnea  or edema. EF was noted to be normal at the time of her stress test in April 2015. She remains on beta  blocker, ARB, and Lasix therapy.  5. Disposition: Follow-up carotid ultrasound and stress testing. Follow-up in clinic in 3 months or sooner if necessary.   Murray Hodgkins, NP 05/26/2016, 12:21 PM

## 2016-05-26 NOTE — Patient Instructions (Signed)
Medication Instructions:  Your physician recommends that you continue on your current medications as directed. Please refer to the Current Medication list given to you today.  Labwork: NONE  Testing/Procedures: Your physician has requested that you have a lexiscan myoview. For further information please visit [email or web address]. Please follow instruction sheet, as given.  Your physician has requested that you have a carotid duplex. This test is an ultrasound of the carotid arteries in your neck. It looks at blood flow through these arteries that supply the brain with blood. Allow one hour for this exam. There are no restrictions or special instructions.  Both of these test will be completed at the [facility].  Follow-Up: Your physician recommends that you schedule a follow-up appointment in: 3 MONTHS with Dr. Claiborne Billings.   Any Other Special Instructions Will Be Listed Below (If Applicable).     If you need a refill on your cardiac medications before your next appointment, please call your pharmacy.

## 2016-06-14 ENCOUNTER — Telehealth (HOSPITAL_COMMUNITY): Payer: Self-pay

## 2016-06-14 NOTE — Telephone Encounter (Signed)
Encounter complete. 

## 2016-06-16 ENCOUNTER — Emergency Department (HOSPITAL_COMMUNITY): Payer: Medicare Other

## 2016-06-16 ENCOUNTER — Encounter (HOSPITAL_COMMUNITY): Payer: Self-pay | Admitting: Emergency Medicine

## 2016-06-16 ENCOUNTER — Inpatient Hospital Stay (HOSPITAL_COMMUNITY): Admission: RE | Admit: 2016-06-16 | Payer: Medicare Other | Source: Ambulatory Visit

## 2016-06-16 ENCOUNTER — Observation Stay (HOSPITAL_COMMUNITY)
Admission: EM | Admit: 2016-06-16 | Discharge: 2016-06-18 | Disposition: A | Discharge status: Home / Self Care | Payer: Medicare Other | Attending: Cardiovascular Disease | Admitting: Cardiovascular Disease

## 2016-06-16 DIAGNOSIS — I447 Left bundle-branch block, unspecified: Secondary | ICD-10-CM | POA: Diagnosis present

## 2016-06-16 DIAGNOSIS — N183 Chronic kidney disease, stage 3 unspecified: Secondary | ICD-10-CM | POA: Diagnosis present

## 2016-06-16 DIAGNOSIS — R079 Chest pain, unspecified: Secondary | ICD-10-CM | POA: Diagnosis present

## 2016-06-16 DIAGNOSIS — Z79899 Other long term (current) drug therapy: Secondary | ICD-10-CM | POA: Diagnosis not present

## 2016-06-16 DIAGNOSIS — R0789 Other chest pain: Secondary | ICD-10-CM | POA: Diagnosis present

## 2016-06-16 DIAGNOSIS — Z8673 Personal history of transient ischemic attack (TIA), and cerebral infarction without residual deficits: Secondary | ICD-10-CM | POA: Insufficient documentation

## 2016-06-16 DIAGNOSIS — I428 Other cardiomyopathies: Secondary | ICD-10-CM

## 2016-06-16 DIAGNOSIS — I129 Hypertensive chronic kidney disease with stage 1 through stage 4 chronic kidney disease, or unspecified chronic kidney disease: Secondary | ICD-10-CM | POA: Insufficient documentation

## 2016-06-16 DIAGNOSIS — I10 Essential (primary) hypertension: Secondary | ICD-10-CM | POA: Diagnosis not present

## 2016-06-16 DIAGNOSIS — S86819A Strain of other muscle(s) and tendon(s) at lower leg level, unspecified leg, initial encounter: Secondary | ICD-10-CM

## 2016-06-16 DIAGNOSIS — E039 Hypothyroidism, unspecified: Secondary | ICD-10-CM | POA: Diagnosis not present

## 2016-06-16 DIAGNOSIS — Z7982 Long term (current) use of aspirin: Secondary | ICD-10-CM | POA: Insufficient documentation

## 2016-06-16 DIAGNOSIS — Z96653 Presence of artificial knee joint, bilateral: Secondary | ICD-10-CM | POA: Insufficient documentation

## 2016-06-16 DIAGNOSIS — R5381 Other malaise: Secondary | ICD-10-CM | POA: Diagnosis present

## 2016-06-16 LAB — BASIC METABOLIC PANEL
Anion gap: 7 (ref 5–15)
BUN: 23 mg/dL — ABNORMAL HIGH (ref 6–20)
CO2: 27 mmol/L (ref 22–32)
Calcium: 9.5 mg/dL (ref 8.9–10.3)
Chloride: 107 mmol/L (ref 101–111)
Creatinine, Ser: 1.13 mg/dL — ABNORMAL HIGH (ref 0.44–1.00)
GFR calc Af Amer: 53 mL/min — ABNORMAL LOW (ref >60)
GFR calc non Af Amer: 45 mL/min — ABNORMAL LOW (ref >60)
Glucose, Bld: 88 mg/dL (ref 65–99)
Potassium: 4.2 mmol/L (ref 3.5–5.1)
Sodium: 141 mmol/L (ref 135–145)

## 2016-06-16 LAB — CBC
HCT: 32.8 % — ABNORMAL LOW (ref 36.0–46.0)
HCT: 30.8 % — ABNORMAL LOW (ref 36.0–46.0)
Hemoglobin: 10.4 g/dL — ABNORMAL LOW (ref 12.0–15.0)
Hemoglobin: 10.1 g/dL — ABNORMAL LOW (ref 12.0–15.0)
MCH: 30.4 pg (ref 26.0–34.0)
MCH: 31.2 pg (ref 26.0–34.0)
MCHC: 31.7 g/dL (ref 30.0–36.0)
MCHC: 32.8 g/dL (ref 30.0–36.0)
MCV: 95.9 fL (ref 78.0–100.0)
MCV: 95.1 fL (ref 78.0–100.0)
PLATELETS: 259 10*3/uL (ref 150–400)
Platelets: 279 10*3/uL (ref 150–400)
RBC: 3.42 MIL/uL — ABNORMAL LOW (ref 3.87–5.11)
RBC: 3.24 MIL/uL — ABNORMAL LOW (ref 3.87–5.11)
RDW: 13.9 % (ref 11.5–15.5)
RDW: 14.1 % (ref 11.5–15.5)
WBC: 5.4 10*3/uL (ref 4.0–10.5)
WBC: 6 10*3/uL (ref 4.0–10.5)

## 2016-06-16 LAB — I-STAT TROPONIN, ED
Troponin i, poc: 0.01 ng/mL (ref 0.00–0.08)
Troponin i, poc: 0 ng/mL (ref 0.00–0.08)

## 2016-06-16 LAB — CREATININE, SERUM
Creatinine, Ser: 1.1 mg/dL — ABNORMAL HIGH (ref 0.44–1.00)
GFR calc non Af Amer: 47 mL/min — ABNORMAL LOW (ref >60)
GFR, EST AFRICAN AMERICAN: 54 mL/min — AB (ref >60)

## 2016-06-16 LAB — GLUCOSE, CAPILLARY: GLUCOSE-CAPILLARY: 81 mg/dL (ref 65–99)

## 2016-06-16 LAB — HEPATIC FUNCTION PANEL
ALK PHOS: 120 U/L (ref 38–126)
ALT: 14 U/L (ref 14–54)
AST: 24 U/L (ref 15–41)
Albumin: 3.3 g/dL — ABNORMAL LOW (ref 3.5–5.0)
BILIRUBIN DIRECT: 0.1 mg/dL (ref 0.1–0.5)
BILIRUBIN TOTAL: 0.6 mg/dL (ref 0.3–1.2)
Indirect Bilirubin: 0.5 mg/dL (ref 0.3–0.9)
Total Protein: 7.3 g/dL (ref 6.5–8.1)

## 2016-06-16 LAB — TSH: TSH: 2.958 u[IU]/mL (ref 0.350–4.500)

## 2016-06-16 LAB — TROPONIN I: Troponin I: <0.03 ng/mL (ref <0.03)

## 2016-06-16 LAB — MAGNESIUM: Magnesium: 2.1 mg/dL (ref 1.7–2.4)

## 2016-06-16 LAB — T4, FREE: FREE T4: 0.86 ng/dL (ref 0.61–1.12)

## 2016-06-16 MED ORDER — HYDROCODONE-ACETAMINOPHEN 5-325 MG PO TABS
1.0000 | ORAL_TABLET | Freq: Every evening | ORAL | Status: DC
  Administered 2016-06-16 – 2016-06-17 (×2): 1 via ORAL
  Filled 2016-06-16 (×2): qty 1

## 2016-06-16 MED ORDER — ISOSORBIDE MONONITRATE ER 60 MG PO TB24
60.0000 mg | ORAL_TABLET | Freq: Every day | ORAL | Status: DC
  Administered 2016-06-17 – 2016-06-18 (×2): 60 mg via ORAL
  Filled 2016-06-16 (×2): qty 1

## 2016-06-16 MED ORDER — FERROUS SULFATE 325 (65 FE) MG PO TABS
325.0000 mg | ORAL_TABLET | ORAL | Status: DC
  Administered 2016-06-17: 325 mg via ORAL
  Filled 2016-06-16: qty 1

## 2016-06-16 MED ORDER — CYCLOSPORINE 0.05 % OP EMUL
1.0000 [drp] | Freq: Two times a day (BID) | OPHTHALMIC | Status: DC
  Administered 2016-06-16 – 2016-06-18 (×4): 1 [drp] via OPHTHALMIC
  Filled 2016-06-16 (×4): qty 1

## 2016-06-16 MED ORDER — LIDOCAINE 5 % EX PTCH
1.0000 | MEDICATED_PATCH | Freq: Every day | CUTANEOUS | Status: DC
  Administered 2016-06-16 – 2016-06-17 (×2): 1 via TRANSDERMAL
  Filled 2016-06-16 (×2): qty 1

## 2016-06-16 MED ORDER — GUAIFENESIN 100 MG/5ML PO SYRP
200.0000 mg | ORAL_SOLUTION | Freq: Four times a day (QID) | ORAL | Status: DC | PRN
  Filled 2016-06-16: qty 10

## 2016-06-16 MED ORDER — BUPROPION HCL ER (XL) 150 MG PO TB24
150.0000 mg | ORAL_TABLET | Freq: Every morning | ORAL | Status: DC
  Administered 2016-06-17 – 2016-06-18 (×2): 150 mg via ORAL
  Filled 2016-06-16 (×2): qty 1

## 2016-06-16 MED ORDER — PANTOPRAZOLE SODIUM 40 MG PO TBEC
40.0000 mg | DELAYED_RELEASE_TABLET | Freq: Two times a day (BID) | ORAL | Status: DC
  Administered 2016-06-16 – 2016-06-18 (×4): 40 mg via ORAL
  Filled 2016-06-16 (×4): qty 1

## 2016-06-16 MED ORDER — LATANOPROST 0.005 % OP SOLN
1.0000 [drp] | Freq: Every day | OPHTHALMIC | Status: DC
  Administered 2016-06-16 – 2016-06-17 (×2): 1 [drp] via OPHTHALMIC
  Filled 2016-06-16: qty 2.5

## 2016-06-16 MED ORDER — GABAPENTIN 300 MG PO CAPS
300.0000 mg | ORAL_CAPSULE | Freq: Two times a day (BID) | ORAL | Status: DC
  Administered 2016-06-16 – 2016-06-18 (×4): 300 mg via ORAL
  Filled 2016-06-16 (×4): qty 1

## 2016-06-16 MED ORDER — ACETAMINOPHEN 325 MG PO TABS
650.0000 mg | ORAL_TABLET | ORAL | Status: DC | PRN
  Administered 2016-06-16 – 2016-06-18 (×4): 650 mg via ORAL
  Filled 2016-06-16 (×4): qty 2

## 2016-06-16 MED ORDER — LOSARTAN POTASSIUM 25 MG PO TABS
25.0000 mg | ORAL_TABLET | Freq: Every day | ORAL | Status: DC
  Administered 2016-06-16 – 2016-06-18 (×3): 25 mg via ORAL
  Filled 2016-06-16 (×3): qty 1

## 2016-06-16 MED ORDER — FENTANYL 50 MCG/HR TD PT72
50.0000 ug | MEDICATED_PATCH | TRANSDERMAL | Status: DC
  Administered 2016-06-17: 50 ug via TRANSDERMAL
  Filled 2016-06-16 (×2): qty 1

## 2016-06-16 MED ORDER — LORAZEPAM 1 MG PO TABS
1.0000 mg | ORAL_TABLET | Freq: Every day | ORAL | Status: DC
  Administered 2016-06-16 – 2016-06-17 (×2): 1 mg via ORAL
  Filled 2016-06-16 (×2): qty 1

## 2016-06-16 MED ORDER — HYPROMELLOSE (GONIOSCOPIC) 2.5 % OP SOLN
1.0000 [drp] | Freq: Four times a day (QID) | OPHTHALMIC | Status: DC
  Administered 2016-06-16 – 2016-06-18 (×7): 1 [drp] via OPHTHALMIC
  Filled 2016-06-16: qty 15

## 2016-06-16 MED ORDER — SODIUM CHLORIDE 0.9 % IV SOLN: 250.0000 mL | INTRAVENOUS | Status: DC | PRN

## 2016-06-16 MED ORDER — SODIUM CHLORIDE 0.9% FLUSH
3.0000 mL | Freq: Two times a day (BID) | INTRAVENOUS | Status: DC
  Administered 2016-06-16 – 2016-06-17 (×3): 3 mL via INTRAVENOUS

## 2016-06-16 MED ORDER — ACETAMINOPHEN 500 MG PO TABS: 500.0000 mg | ORAL_TABLET | ORAL | Status: DC | PRN

## 2016-06-16 MED ORDER — MELATONIN 3 MG PO TABS
3.0000 mg | ORAL_TABLET | Freq: Every day | ORAL | Status: DC
  Administered 2016-06-16 – 2016-06-17 (×2): 3 mg via ORAL
  Filled 2016-06-16 (×2): qty 1

## 2016-06-16 MED ORDER — ASPIRIN 81 MG PO CHEW
324.0000 mg | CHEWABLE_TABLET | ORAL | Status: AC
  Administered 2016-06-16: 324 mg via ORAL
  Filled 2016-06-16: qty 4

## 2016-06-16 MED ORDER — ONDANSETRON HCL 4 MG/2ML IJ SOLN: 4.0000 mg | Freq: Four times a day (QID) | INTRAMUSCULAR | Status: DC | PRN

## 2016-06-16 MED ORDER — ASPIRIN 81 MG PO CHEW
81.0000 mg | CHEWABLE_TABLET | Freq: Every day | ORAL | Status: DC
  Administered 2016-06-16 – 2016-06-18 (×3): 81 mg via ORAL
  Filled 2016-06-16 (×2): qty 1

## 2016-06-16 MED ORDER — VITAMIN B-12 100 MCG PO TABS
50.0000 ug | ORAL_TABLET | Freq: Every day | ORAL | Status: DC
  Administered 2016-06-17 – 2016-06-18 (×2): 50 ug via ORAL
  Filled 2016-06-16 (×2): qty 1

## 2016-06-16 MED ORDER — ASPIRIN EC 81 MG PO TBEC: 81.0000 mg | DELAYED_RELEASE_TABLET | Freq: Every day | ORAL | Status: DC

## 2016-06-16 MED ORDER — POLYETHYLENE GLYCOL 3350 17 G PO PACK: 17.0000 g | PACK | Freq: Every day | ORAL | Status: DC | PRN

## 2016-06-16 MED ORDER — SODIUM CHLORIDE 0.9% FLUSH: 3.0000 mL | INTRAVENOUS | Status: DC | PRN

## 2016-06-16 MED ORDER — CARVEDILOL 12.5 MG PO TABS
12.5000 mg | ORAL_TABLET | Freq: Two times a day (BID) | ORAL | Status: DC
  Administered 2016-06-17 – 2016-06-18 (×3): 12.5 mg via ORAL
  Filled 2016-06-16 (×3): qty 1

## 2016-06-16 MED ORDER — ARIPIPRAZOLE 2 MG PO TABS
2.0000 mg | ORAL_TABLET | Freq: Every day | ORAL | Status: DC
  Administered 2016-06-16 – 2016-06-18 (×3): 2 mg via ORAL
  Filled 2016-06-16 (×3): qty 1

## 2016-06-16 MED ORDER — ALUM & MAG HYDROXIDE-SIMETH 200-200-20 MG/5ML PO SUSP: 30.0000 mL | Freq: Four times a day (QID) | ORAL | Status: DC | PRN

## 2016-06-16 MED ORDER — LORAZEPAM 0.5 MG PO TABS
0.5000 mg | ORAL_TABLET | Freq: Every day | ORAL | Status: DC
  Administered 2016-06-17 – 2016-06-18 (×2): 0.5 mg via ORAL
  Filled 2016-06-16 (×2): qty 1

## 2016-06-16 MED ORDER — NITROGLYCERIN 0.4 MG SL SUBL: 0.4000 mg | SUBLINGUAL_TABLET | SUBLINGUAL | Status: DC | PRN

## 2016-06-16 MED ORDER — HEPARIN SODIUM (PORCINE) 5000 UNIT/ML IJ SOLN
5000.0000 [IU] | Freq: Three times a day (TID) | INTRAMUSCULAR | Status: DC
  Administered 2016-06-16 – 2016-06-18 (×5): 5000 [IU] via SUBCUTANEOUS
  Filled 2016-06-16 (×5): qty 1

## 2016-06-16 MED ORDER — MAGNESIUM HYDROXIDE 400 MG/5ML PO SUSP: 30.0000 mL | Freq: Every evening | ORAL | Status: DC | PRN

## 2016-06-16 MED ORDER — ATORVASTATIN CALCIUM 40 MG PO TABS
40.0000 mg | ORAL_TABLET | Freq: Every day | ORAL | Status: DC
  Administered 2016-06-17: 40 mg via ORAL
  Filled 2016-06-16: qty 1

## 2016-06-16 MED ORDER — ASPIRIN 300 MG RE SUPP: 300.0000 mg | RECTAL | Status: AC

## 2016-06-16 MED ORDER — AMLODIPINE BESYLATE 5 MG PO TABS
5.0000 mg | ORAL_TABLET | Freq: Every day | ORAL | Status: DC
  Administered 2016-06-16 – 2016-06-17 (×2): 5 mg via ORAL
  Filled 2016-06-16 (×2): qty 1

## 2016-06-16 MED ORDER — FUROSEMIDE 20 MG PO TABS
20.0000 mg | ORAL_TABLET | Freq: Every day | ORAL | Status: DC
  Administered 2016-06-16 – 2016-06-18 (×3): 20 mg via ORAL
  Filled 2016-06-16 (×3): qty 1

## 2016-06-16 NOTE — H&P (Signed)
Jennifer [name] is an 81 y.o. female.    Primary Cardiologist: Dr. Claiborne Billings  PCP: Reymundo Poll, MD  Chief Complaint: chest pain  HPI: 81 year old female with a prior history of nonischemic cardiomyopathy with subsequent normalization of LV function, normal coronary arteries in 2006, left bundle branch block, hypertension, atypical chest pain, hiatal hernia, and osteoporosis.   Follow-up echocardiography in 2011 showed normalization of LV function, while her last echo in October 2014 showed an EF of 45-50% with septal motion abnormality most likely consistent with left bundle branch block. Stress test was undertaken in the setting of chest discomfort in April 2015, and this showed no evidence of ischemia and normal LV function. Other history includes hypertension, left bundle branch block, hiatal hernia, and osteoporosis. She is status post bilateral knee replacements in the past.  She was seen in the office 05/26/16 first visit since 2015.  She lives in an assisted living and is wheelchair bound due problems with knees.  She is on fentanyl patch.   3 weeks ago she woke from sleep with lt arm weakness and numbness and garbled speech.  She also had chest discomfort.  She was treated with ASA.  No residual symptoms.   Since that times she had been having some chest pain.   She had nuc ordered and carotid dopplers.     EKG: SR with LBBB which is chronic Troponin 0.01 and 0.00  hgb 10.4 and 32.8  CXR with mild cardiomegaly vascular congestion.Cr. 1.13 K+ 4.2  Currently still having chest pain initially sharp this AM now pressure.  1 NTG without relief.  This AM she was nauseated and some SOB.  No lightheadedness.    Her troponins so far have been neg.   She was to have nuc today but had to come here instead.  Has been having chest pain at least once a week if not more.   Past Medical History:  Diagnosis Date  . Anemia   . Anxiety   . Arthritis   . Atypical chest pain    a. 2006 Cath:  nl cors;  b. 05/2010 MV: no ischemia;  c. 05/2013 MV: nl EF, no ischemia.  . Blood transfusion   . Cardiomyopathy, nonischemic ([place])    a. 2006 EF initially 30%;  b. 10/2009 Echo EF now 50-55%; c. 11/2012 Echo: EF 45-50% w/ septal motion abnormality (LBBB), gr1 DD, mod TR, PASP 56mHg; d. 05/2013 MV: Nl EF by SPECT.  . Constipation due to pain medication   . Dementia   . Depression   . Diarrhea   . Frequency of urination    at night  . GERD (gastroesophageal reflux disease)   . Glaucoma   . Hypertension   . LBBB (left bundle branch block)   . Osteoporosis 04/13/2006  . Paget's disease of bone 11/29/2012  . Psychosis   . Renal disorder    only has one kidney; Right kidney stopped working after last child was born  . [place]    approximately 20 years since last seizure  . Swelling of both ankles    Takes Lasix if needed  . Thyroid disease 04/13/2006   hypothyroidism  . TIA (transient ischemic attack)     Past Surgical History:  Procedure Laterality Date  . BREAST LUMPECTOMY Left    x 2  . CARDIAC CATHETERIZATION    . CHOLECYSTECTOMY    . ESOPHAGOGASTRODUODENOSCOPY N/A 11/30/2012   Procedure: ESOPHAGOGASTRODUODENOSCOPY (EGD);  Surgeon:  Wonda Horner, MD;  Location: [facility] ENDOSCOPY;  Service: Endoscopy;  Laterality: N/A;  . EYE SURGERY     cataract removal pt unsure which eye  . FOOT SURGERY Left   . JOINT REPLACEMENT Bilateral    knees  . NEPHRECTOMY  1963  . PATELLAR TENDON REPAIR Right 07/12/2013   Procedure: PRIMARY LEFT PATELLA TENDON REPAIR;  Surgeon: Kerin Salen, MD;  Location: [facility];  Service: Orthopedics;  Laterality: Right;  . SHOULDER SURGERY Right   . TONSILLECTOMY    . TOTAL KNEE REVISION Right 06/19/2013   Procedure: RIGHT TOTAL KNEE REVISION;  Surgeon: Kerin Salen, MD;  Location: [facility];  Service: Orthopedics;  Laterality: Right;  . TUBAL LIGATION      Family History  Problem Relation Age of Onset  . Stomach cancer Mother   . Diabetes Brother   .  Heart attack Brother    Social History:  reports that she has never smoked. Her smokeless tobacco use includes Snuff. She reports that she does not drink alcohol or use drugs.  Allergies:  Allergies  Allergen Reactions  . Codeine Other (See Comments)    Makes her sick on her stomach.   OUTPATIENT MEDICATIONS: No current facility-administered medications on file prior to encounter.    Current Outpatient Prescriptions on File Prior to Encounter  Medication Sig Dispense Refill  . amLODipine (NORVASC) 5 MG tablet Take 5 mg by mouth at bedtime.    [name] buPROPion (WELLBUTRIN XL) 150 MG 24 hr tablet Take 150 mg by mouth every morning.    . carvedilol (COREG) 12.5 MG tablet Take 12.5 mg by mouth 2 (two) times daily with a meal.    . cycloSPORINE (RESTASIS) 0.05 % ophthalmic emulsion Place 1 drop into both eyes 2 (two) times daily.    . diclofenac sodium (VOLTAREN) 1 % GEL Apply 2 g topically 4 (four) times daily. Rub into affected area of foot 2 to 4 times daily (Patient taking differently: Apply 2 g topically 2 (two) times daily. Rub into affected area of foot 2 to 4 times daily) 100 g 2  . fentaNYL (DURAGESIC - DOSED MCG/HR) 50 MCG/HR Place 50 mcg onto the skin every 3 (three) days.    . ferrous sulfate 325 (65 FE) MG tablet Take 325 mg by mouth 3 (three) times a week. On Mon, Wed, and Fri.    . furosemide (LASIX) 20 MG tablet Take 1 tablet (20 mg total) by mouth daily. 30 tablet 6  . gabapentin (NEURONTIN) 300 MG capsule Take 1 capsule by mouth 2 (two) times daily.    [name] guaifenesin (ROBITUSSIN) 100 MG/5ML syrup Take 200 mg by mouth every 6 (six) hours as needed for cough.    [name] HYDROcodone-acetaminophen (NORCO/VICODIN) 5-325 MG per tablet Take one tablet by mouth every 6 hours as needed for mild to moderate pain; Take two tablets by mouth every 6 hours as needed for moderate to severe pain (Patient taking differently: Take 1 tablet by mouth every evening. ) 240 tablet 0  . isosorbide mononitrate  (IMDUR) 30 MG 24 hr tablet Take 30 mg by mouth daily.    [name] latanoprost (XALATAN) 0.005 % ophthalmic solution Place 1 drop into both eyes at bedtime.    . Lidocaine (ASPERCREME LIDOCAINE) 4 % PTCH Apply 1 patch topically daily. Apply patch in evening and remove in the morning    . LORazepam (ATIVAN) 0.5 MG tablet Take 0.5-1 mg by mouth 2 (two) times daily. Take 1 tablet  in the morning and 2 tablets at bedtime    . losartan (COZAAR) 25 MG tablet Take 1 tablet by mouth daily.    . magnesium hydroxide (MILK OF MAGNESIA) 400 MG/5ML suspension Take 30 mLs by mouth at bedtime as needed for mild constipation.     . Multiple Vitamins-Minerals (CEROVITE ADVANCED FORMULA PO) Take by mouth daily.    . pantoprazole (PROTONIX) 40 MG tablet Take 40 mg by mouth daily.    . vitamin B-12 (CYANOCOBALAMIN) 50 MCG tablet Take 50 mcg by mouth daily.    [name] aspirin EC 325 MG tablet Take 1 tablet (325 mg total) by mouth 2 (two) times daily. (Patient not taking: Reported on 06/16/2016) 30 tablet 0  . methocarbamol (ROBAXIN) 500 MG tablet Take 1 tablet (500 mg total) by mouth 2 (two) times daily with a meal. (Patient not taking: Reported on 06/16/2016) 60 tablet 0  . NONFORMULARY OR COMPOUNDED ITEM [facility]:  "Authorized Substitute" Pain Cream Formulation - Ibuprofen 15%, Gaclofen 1%, Gabapentin 3%, Lidocaine 2%, apply 2 grams to affected area 3 times daily. (Patient not taking: Reported on 06/16/2016) 120 each 2   .   Results for orders placed or performed during the hospital encounter of 06/16/16 (from the past 48 hour(s))  Basic metabolic panel     Status: Abnormal   Collection Time: 06/16/16  9:23 AM  Result Value Ref Range   Sodium 141 135 - 145 mmol/L   Potassium 4.2 3.5 - 5.1 mmol/L   Chloride 107 101 - 111 mmol/L   CO2 27 22 - 32 mmol/L   Glucose, Bld 88 65 - 99 mg/dL   BUN 23 (H) 6 - 20 mg/dL   Creatinine, Ser 1.13 (H) 0.44 - 1.00 mg/dL   Calcium 9.5 8.9 - 10.3 mg/dL   GFR calc non Af Amer 45 (L) >60  mL/min   GFR calc Af Amer 53 (L) >60 mL/min    Comment: (NOTE) The eGFR has been calculated using the CKD EPI equation. This calculation has not been validated in all clinical situations. eGFR's persistently <60 mL/min signify possible Chronic Kidney Disease.    Anion gap 7 5 - 15  CBC     Status: Abnormal   Collection Time: 06/16/16  9:23 AM  Result Value Ref Range   WBC 5.4 4.0 - 10.5 K/uL   RBC 3.42 (L) 3.87 - 5.11 MIL/uL   Hemoglobin 10.4 (L) 12.0 - 15.0 g/dL   HCT 32.8 (L) 36.0 - 46.0 %   MCV 95.9 78.0 - 100.0 fL   MCH 30.4 26.0 - 34.0 pg   MCHC 31.7 30.0 - 36.0 g/dL   RDW 13.9 11.5 - 15.5 %   Platelets 279 150 - 400 K/uL  I-stat troponin, ED     Status: None   Collection Time: 06/16/16  9:38 AM  Result Value Ref Range   Troponin i, poc 0.01 0.00 - 0.08 ng/mL   Comment 3            Comment: Due to the release kinetics of cTnI, a negative result within the first hours of the onset of symptoms does not rule out myocardial infarction with certainty. If myocardial infarction is still suspected, repeat the test at appropriate intervals.   I-stat troponin, ED     Status: None   Collection Time: 06/16/16  2:12 PM  Result Value Ref Range   Troponin i, poc 0.00 0.00 - 0.08 ng/mL   Comment 3  Comment: Due to the release kinetics of cTnI, a negative result within the first hours of the onset of symptoms does not rule out myocardial infarction with certainty. If myocardial infarction is still suspected, repeat the test at appropriate intervals.    Dg Chest 2 View  Result Date: 06/16/2016 CLINICAL DATA:  Chest pain. EXAM: CHEST  2 VIEW COMPARISON:  11/27/2012 FINDINGS: Mild cardiomegaly and vascular congestion. No confluent opacities, effusions or edema. Degenerative changes in the thoracic spine. IMPRESSION: Mild cardiomegaly, vascular congestion. Electronically Signed   By: Rolm Baptise M.D.   On: 06/16/2016 09:04    VYX:AJLUNGB:MB colds or fevers, no weight  changes Skin:no rashes or ulcers HEENT:no blurred vision, no congestion CV:see HPI PUL:see HPI GI:no diarrhea constipation or melena, no indigestion-does have dark stools but is on iron, she thought she had cancer GU:no hematuria, no dysuria MS:+ joint pain, no claudication Neuro:no syncope, no lightheadedness Endo:no diabetes, no thyroid disease  Blood pressure (!) 143/54, pulse 76, temperature 98.2 F (36.8 C), temperature source Oral, resp. rate 13, height 5' 4"  (1.626 m), weight 204 lb (92.5 kg), SpO2 99 %. PE: General:Pleasant affect, NAD Skin:Warm and dry, brisk capillary refill HEENT:normocephalic, sclera clear, mucus membranes moist Neck:supple, no JVD, no bruits  Heart:S1S2 RRR without murmur, gallup, rub or click Lungs:clear without rales, rhonchi, or wheezes OMQ:TTCN, non tender, + BS, do not palpate liver spleen or masses Ext:no to tr lower ext edema, 2+ pedal pulses, 2+ radial pulses Neuro:alert and oriented X 3, MAE, follows commands, + facial symmetry, strong push and pull of feet.     Assessment/Plan Chest pain, has been occurring at rest since April and seems to be occurring more freq.  Initial troponins are neg.  She still has chest pressure.  Her nuc was for today.   Admit serial troponins and if + add Heparin. But otherwise subcu heparin.  Do nuc study here in AM Lexiscan pt cannot walk due to knee pain - increase imdur. Hx of patent cors in 2006 with neg nuc in 2015.    Hx of NICM last echo 2015 with EF 45-50% improved from 35%.    Recent hx of possible TIA carotid dopplers were ordered with Rt carotid bruit.    HTN, Essential BP stable to mildly elevated  Chronic LBBB  bil knee replacements with chronic pain on fentanyl patch. Wheelchair bound.    Cecilie Kicks Nurse Practitioner Certified [place] Pager [phone] or after 5pm or weekends call [phone] 06/16/2016, 3:54 PM

## 2016-06-16 NOTE — ED Provider Notes (Signed)
[facility] DEPT Provider Note   CSN: [id number] Arrival date & time: 06/16/16  6599     History   Chief Complaint Chief Complaint  Patient presents with  . Chest Pain    HPI Jennifer [name] is a 81 y.o. female.  HPI 81 year old African-American female past medical history significant for nonischemic cardiomyopathy, left bundle branch block, hypertension, atypical chest pain that presents to the ED today by EMS from nursing facility for acute onset of left-sided chest pain. Patient states that she was having a bowel movement earlier this morning when she had sudden left-sided chest pain that radiated under her left breast into her left arm. She denies any shortness of breath, diaphoresis, nausea, emesis with this episode. She does have chronic diarrhea at baseline. Patient has history of atypical chest pain. Was hospitalized one month ago for similar symptoms. At that time the patient was referred to cardiology. She was seen in the office on 05/26/16. They ordered an outpatient stress test and carotid Dopplers to rule out ischemic changes first TIA. Patient appointment was this morning for imaging however she came to the ED due to the chest pain. Patient seems to have chronic chest pain. Wakes up about one time a week with chest pain. However this one did not resolve. EMS gave her 324 of aspirin and 2 nitroglycerin in route. Patient states that her pain improved from 8 out of 10 to a 6 out of 10 and has been constant. She denies any fever, chills, cough, recent illness, shortness of breath, abdominal pain, lower extremity edema, calf tenderness, change in bowel habits, lightheadedness, dizziness or any other associated symptoms. Takes medications as prescribed. Past Medical History:  Diagnosis Date  . Anemia   . Anxiety   . Arthritis   . Atypical chest pain    a. 2006 Cath: nl cors;  b. 05/2010 MV: no ischemia;  c. 05/2013 MV: nl EF, no ischemia.  . Blood transfusion   . Cardiomyopathy,  nonischemic ([place])    a. 2006 EF initially 30%;  b. 10/2009 Echo EF now 50-55%; c. 11/2012 Echo: EF 45-50% w/ septal motion abnormality (LBBB), gr1 DD, mod TR, PASP 60mmHg; d. 05/2013 MV: Nl EF by SPECT.  . Constipation due to pain medication   . Dementia   . Depression   . Diarrhea   . Frequency of urination    at night  . GERD (gastroesophageal reflux disease)   . Glaucoma   . Hypertension   . LBBB (left bundle branch block)   . Osteoporosis 04/13/2006  . Paget's disease of bone 11/29/2012  . Psychosis   . Renal disorder    only has one kidney; Right kidney stopped working after last child was born  . Seizures ([place])    approximately 20 years since last seizure  . Swelling of both ankles    Takes Lasix if needed  . Thyroid disease 04/13/2006   hypothyroidism  . TIA (transient ischemic attack)     Patient Active Problem List   Diagnosis Date Noted  . Lower extremity edema 01/25/2014  . Patellar tendon rupture 07/12/2013  . Constipation 07/02/2013  . Knee pain 06/28/2013  . Acute posthemorrhagic anemia 06/28/2013  . Senile dementia, uncomplicated 35/70/1779  . Right Mechanical complication of knee prosthesis 06/19/2013  . 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 Paget's disease of bone 11/29/2012  . Nausea alone 11/29/2012  . GERD (gastroesophageal reflux disease) 11/29/2012  . Nonischemic cardiomyopathy ([place]) 11/29/2012  . Anemia  11/29/2012  . Depression 11/29/2012  . Chest pain 11/28/2012  . Abdominal pain, epigastric 11/28/2012  . UTERINE FIBROID 04/13/2006  . HYPOTHYROIDISM, UNSPECIFIED 04/13/2006  . HYPOPARATHYROIDISM 04/13/2006  . HYPERLIPIDEMIA 04/13/2006  . OBESITY, NOS 04/13/2006  . Pernicious anemia 04/13/2006  . ANXIETY 04/13/2006  . DEPRESSIVE DISORDER, NOS 04/13/2006  . GLAUCOMA 04/13/2006  . HYPERTENSION, BENIGN SYSTEMIC 04/13/2006  . GASTROESOPHAGEAL REFLUX, NO ESOPHAGITIS 04/13/2006  . CKD (chronic kidney disease) stage 3, GFR 30-59  ml/min 04/13/2006  . OSTEOARTHRITIS, LOWER LEG 04/13/2006  . ROTATOR CUFF TENDONITIS 04/13/2006  . OSTEOPOROSIS, UNSPECIFIED 04/13/2006  . COSTOCHONDRITIS 04/13/2006  . INSOMNIA NOS 04/13/2006  . PAIN, GENERALIZED 04/13/2006  . INCONTINENCE, URGE 04/13/2006    Past Surgical History:  Procedure Laterality Date  . BREAST LUMPECTOMY Left    x 2  . CARDIAC CATHETERIZATION    . CHOLECYSTECTOMY    . ESOPHAGOGASTRODUODENOSCOPY N/A 11/30/2012   Procedure: ESOPHAGOGASTRODUODENOSCOPY (EGD);  Surgeon: Wonda Horner, MD;  Location: [facility] ENDOSCOPY;  Service: Endoscopy;  Laterality: N/A;  . EYE SURGERY     cataract removal pt unsure which eye  . FOOT SURGERY Left   . JOINT REPLACEMENT Bilateral    knees  . NEPHRECTOMY  1963  . PATELLAR TENDON REPAIR Right 07/12/2013   Procedure: PRIMARY LEFT PATELLA TENDON REPAIR;  Surgeon: Kerin Salen, MD;  Location: [facility];  Service: Orthopedics;  Laterality: Right;  . SHOULDER SURGERY Right   . TONSILLECTOMY    . TOTAL KNEE REVISION Right 06/19/2013   Procedure: RIGHT TOTAL KNEE REVISION;  Surgeon: Kerin Salen, MD;  Location: [facility];  Service: Orthopedics;  Laterality: Right;  . TUBAL LIGATION      OB History    No data available       Home Medications    Prior to Admission medications   Medication Sig Start Date End Date Taking? Authorizing Provider  acetaminophen (TYLENOL) 500 MG tablet Take 500 mg by mouth every 4 (four) hours as needed for mild pain.   Yes Historical Provider, MD  alum & mag hydroxide-simeth (MINTOX) 200-200-20 MG/5ML suspension Take 30 mLs by mouth every 6 (six) hours as needed for indigestion or heartburn.   Yes Historical Provider, MD  amLODipine (NORVASC) 5 MG tablet Take 5 mg by mouth at bedtime.   Yes Historical Provider, MD  ARIPiprazole (ABILIFY) 2 MG tablet Take 2 mg by mouth daily.   Yes Historical Provider, MD  aspirin 81 MG chewable tablet Chew 81 mg by mouth daily.   Yes Historical Provider, MD  buPROPion  (WELLBUTRIN XL) 150 MG 24 hr tablet Take 150 mg by mouth every morning.   Yes Historical Provider, MD  carboxymethylcellulose (REFRESH TEARS) 0.5 % SOLN Place 1 drop into both eyes 4 (four) times daily.   Yes Historical Provider, MD  carvedilol (COREG) 12.5 MG tablet Take 12.5 mg by mouth 2 (two) times daily with a meal.   Yes Historical Provider, MD  cycloSPORINE (RESTASIS) 0.05 % ophthalmic emulsion Place 1 drop into both eyes 2 (two) times daily.   Yes Historical Provider, MD  diclofenac sodium (VOLTAREN) 1 % GEL Apply 2 g topically 4 (four) times daily. Rub into affected area of foot 2 to 4 times daily Patient taking differently: Apply 2 g topically 2 (two) times daily. Rub into affected area of foot 2 to 4 times daily 03/10/16  Yes Trula Slade, DPM  fentaNYL (DURAGESIC - DOSED MCG/HR) 50 MCG/HR Place 50 mcg onto the skin every  3 (three) days.   Yes Historical Provider, MD  ferrous sulfate 325 (65 FE) MG tablet Take 325 mg by mouth 3 (three) times a week. On Mon, Wed, and Fri.   Yes Historical Provider, MD  furosemide (LASIX) 20 MG tablet Take 1 tablet (20 mg total) by mouth daily. 01/23/14  Yes Troy Sine, MD  gabapentin (NEURONTIN) 300 MG capsule Take 1 capsule by mouth 2 (two) times daily. 05/05/16  Yes Historical Provider, MD  guaifenesin (ROBITUSSIN) 100 MG/5ML syrup Take 200 mg by mouth every 6 (six) hours as needed for cough.   Yes Historical Provider, MD  HYDROcodone-acetaminophen (NORCO/VICODIN) 5-325 MG per tablet Take one tablet by mouth every 6 hours as needed for mild to moderate pain; Take two tablets by mouth every 6 hours as needed for moderate to severe pain Patient taking differently: Take 1 tablet by mouth every evening.  07/16/13  Yes Mahima Bubba Camp, MD  isosorbide mononitrate (IMDUR) 30 MG 24 hr tablet Take 30 mg by mouth daily. 05/23/16  Yes Historical Provider, MD  latanoprost (XALATAN) 0.005 % ophthalmic solution Place 1 drop into both eyes at bedtime.   Yes Historical  Provider, MD  Lidocaine (ASPERCREME LIDOCAINE) 4 % PTCH Apply 1 patch topically daily. Apply patch in evening and remove in the morning   Yes Historical Provider, MD  LORazepam (ATIVAN) 0.5 MG tablet Take 0.5-1 mg by mouth 2 (two) times daily. Take 1 tablet in the morning and 2 tablets at bedtime   Yes Historical Provider, MD  losartan (COZAAR) 25 MG tablet Take 1 tablet by mouth daily. 05/10/16  Yes Historical Provider, MD  magnesium hydroxide (MILK OF MAGNESIA) 400 MG/5ML suspension Take 30 mLs by mouth at bedtime as needed for mild constipation.    Yes Historical Provider, MD  Melatonin 3 MG TABS Take 3 mg by mouth at bedtime.   Yes Historical Provider, MD  Multiple Vitamins-Minerals (CEROVITE ADVANCED FORMULA PO) Take by mouth daily.   Yes Historical Provider, MD  neomycin-bacitracin-polymyxin (NEOSPORIN) ointment Apply 1 application topically as needed for wound care. apply to eye   Yes Historical Provider, MD  pantoprazole (PROTONIX) 40 MG tablet Take 40 mg by mouth daily.   Yes Historical Provider, MD  polyethylene glycol (MIRALAX / GLYCOLAX) packet Take 17 g by mouth daily as needed for mild constipation.   Yes Historical Provider, MD  vitamin B-12 (CYANOCOBALAMIN) 50 MCG tablet Take 50 mcg by mouth daily.   Yes Historical Provider, MD  aspirin EC 325 MG tablet Take 1 tablet (325 mg total) by mouth 2 (two) times daily. Patient not taking: Reported on 06/16/2016 07/12/13   [name], PA-C  methocarbamol (ROBAXIN) 500 MG tablet Take 1 tablet (500 mg total) by mouth 2 (two) times daily with a meal. Patient not taking: Reported on 06/16/2016 07/12/13   [name], PA-C  NONFORMULARY OR COMPOUNDED ITEM [facility]:  "Authorized Substitute" Pain Cream Formulation - Ibuprofen 15%, Gaclofen 1%, Gabapentin 3%, Lidocaine 2%, apply 2 grams to affected area 3 times daily. Patient not taking: Reported on 06/16/2016 03/16/16   Trula Slade, DPM    Family History Family History  Problem  Relation Age of Onset  . Stomach cancer Mother   . Diabetes Brother   . Heart attack Brother     Social History Social History  Substance Use Topics  . Smoking status: Never Smoker  . Smokeless tobacco: Current User    Types: Snuff  . Alcohol use No  Allergies   Codeine   Review of Systems Review of Systems  Constitutional: Negative for chills and fever.  HENT: Negative for congestion.   Eyes: Negative for visual disturbance.  Respiratory: Negative for shortness of breath.   Cardiovascular: Positive for chest pain.  Gastrointestinal: Positive for diarrhea (chronic). Negative for abdominal pain, nausea and vomiting.  Genitourinary: Negative for frequency and urgency.  Musculoskeletal: Negative for back pain.  Skin: Negative for wound.  Neurological: Negative for syncope, weakness, numbness and headaches.     Physical Exam Updated Vital Signs BP (!) 143/54   Pulse 76   Temp 98.2 F (36.8 C) (Oral)   Resp 13   Ht 5\' 4"  (1.626 m)   Wt 92.5 kg   SpO2 99%   BMI 35.02 kg/m   Physical Exam  Constitutional: She is oriented to person, place, and time. She appears well-developed and well-nourished. No distress.  Nontoxic-appearing. No acute distress. Resting comfortable in the bed.  HENT:  Head: Normocephalic and atraumatic.  Mouth/Throat: Oropharynx is clear and moist.  Eyes: Conjunctivae and EOM are normal. Pupils are equal, round, and reactive to light. Right eye exhibits no discharge. Left eye exhibits no discharge. No scleral icterus.  Neck: Normal range of motion. Neck supple. No thyromegaly present.  Cardiovascular: Normal rate, regular rhythm, normal heart sounds and intact distal pulses.  Exam reveals no gallop and no friction rub.   No murmur heard. Pulmonary/Chest: Effort normal and breath sounds normal. No respiratory distress. She has no wheezes. She has no rales. She exhibits no tenderness.  Abdominal: Soft. Bowel sounds are normal. She exhibits no  distension. There is no tenderness. There is no rebound and no guarding.  Obese abdomen  Musculoskeletal: Normal range of motion.  No lower extremity edema.  Lymphadenopathy:    She has no cervical adenopathy.  Neurological: She is alert and oriented to person, place, and time.  Skin: Skin is warm and dry. Capillary refill takes less than 2 seconds.  Nursing note and vitals reviewed.    ED Treatments / Results  Labs (all labs ordered are listed, but only abnormal results are displayed) Labs Reviewed  BASIC METABOLIC PANEL - Abnormal; Notable for the following:       Result Value   BUN 23 (*)    Creatinine, Ser 1.13 (*)    GFR calc non Af Amer 45 (*)    GFR calc Af Amer 53 (*)    All other components within normal limits  CBC - Abnormal; Notable for the following:    RBC 3.42 (*)    Hemoglobin 10.4 (*)    HCT 32.8 (*)    All other components within normal limits  I-STAT TROPOININ, ED  I-STAT TROPOININ, ED    EKG  EKG Interpretation  Date/Time:  Thursday Jun 16 2016 08:23:42 EDT Ventricular Rate:  70 PR Interval:    QRS Duration: 141 QT Interval:  435 QTC Calculation: 470 R Axis:   40 Text Interpretation:  Sinus rhythm Left bundle branch block No significant change since last tracing Confirmed by [place] [phone]) on 06/16/2016 1:09:53 PM       Radiology Dg Chest 2 View  Result Date: 06/16/2016 CLINICAL DATA:  Chest pain. EXAM: CHEST  2 VIEW COMPARISON:  11/27/2012 FINDINGS: Mild cardiomegaly and vascular congestion. No confluent opacities, effusions or edema. Degenerative changes in the thoracic spine. IMPRESSION: Mild cardiomegaly, vascular congestion. Electronically Signed   By: Rolm Baptise M.D.   On: 06/16/2016 09:04  Procedures Procedures (including critical care time)  Medications Ordered in ED Medications - No data to display   Initial Impression / Assessment and Plan / ED Course  I have reviewed the triage vital signs and the nursing  notes.  Pertinent labs & imaging results that were available during my care of the patient were reviewed by me and considered in my medical decision making (see chart for details).     Patient presents to the ED with left-sided chest pain that has been persistent since bowel movement this morning. Patient with history of atypical chest pain is all by cardiology. Had a stress test and carotid Doppler scheduled for this morning but did not go to the appointment due to being in the ED. Patient complains of a 6 out of 10 left-sided chest pain however this seems to be chronic in nature. Patient has no other complaints. Labs seem to be at baseline. Mild anemia which is baseline. Negative delta troponins. EKG shows left bundle branch block which patient has history of same. No ischemic changes noted as reviewed by myself and Dr. Wilson Singer. Chest x-ray reveals mild cardiomegaly with vascular congestion but no other acute abnormalities. Patient is nontoxic appearing. Vital signs remained stable. Try to get patient scheduled for an outpatient stress test tomorrow however patient is able to go to appointment due to transport restrictions. Given that patient is 81 year old with ongoing chest pain and need for stress test consulted cardiology. They saw patient in the ED and recommend inpatient treatment with a stress test in the a.m. Patient currently remains hemodynamically stable. Appears to be in no acute distress. Patient was seen and evaluated by Dr. Wilson Singer who is agreeable to the above plan. All questions were answered prior to admission.  Final Clinical Impressions(s) / ED Diagnoses   Final diagnoses:  Atypical chest pain    New Prescriptions New Prescriptions   No medications on file     Aaron Edelman 06/20/16 1529    Virgel Manifold, MD 06/26/16 1329

## 2016-06-16 NOTE — ED Notes (Signed)
ED Provider at bedside. 

## 2016-06-16 NOTE — ED Notes (Signed)
Pt transported to xray 

## 2016-06-16 NOTE — ED Notes (Signed)
Pt will be going to the floor with belongings with her.

## 2016-06-16 NOTE — ED Triage Notes (Signed)
Pt arrives from [facility] via [place] c/o  left central CP that radiates to LUE. EMS reports giving 324 ASA and 2 NTG, reports pain decreased from 8/10 to 6/10. Pt reports pain started around 0300 after episode of diarrhea.  Resp e/u, NAD noted at this time.

## 2016-06-17 ENCOUNTER — Observation Stay (HOSPITAL_BASED_OUTPATIENT_CLINIC_OR_DEPARTMENT_OTHER): Payer: Medicare Other

## 2016-06-17 ENCOUNTER — Inpatient Hospital Stay (HOSPITAL_COMMUNITY): Admit: 2016-06-17 | Payer: Medicare Other

## 2016-06-17 DIAGNOSIS — R079 Chest pain, unspecified: Secondary | ICD-10-CM

## 2016-06-17 DIAGNOSIS — R5381 Other malaise: Secondary | ICD-10-CM | POA: Diagnosis not present

## 2016-06-17 DIAGNOSIS — R0989 Other specified symptoms and signs involving the circulatory and respiratory systems: Secondary | ICD-10-CM

## 2016-06-17 DIAGNOSIS — N183 Chronic kidney disease, stage 3 (moderate): Secondary | ICD-10-CM | POA: Diagnosis not present

## 2016-06-17 DIAGNOSIS — I447 Left bundle-branch block, unspecified: Secondary | ICD-10-CM | POA: Diagnosis not present

## 2016-06-17 DIAGNOSIS — R0789 Other chest pain: Secondary | ICD-10-CM | POA: Diagnosis not present

## 2016-06-17 DIAGNOSIS — R197 Diarrhea, unspecified: Secondary | ICD-10-CM | POA: Diagnosis not present

## 2016-06-17 DIAGNOSIS — E039 Hypothyroidism, unspecified: Secondary | ICD-10-CM | POA: Diagnosis not present

## 2016-06-17 DIAGNOSIS — I10 Essential (primary) hypertension: Secondary | ICD-10-CM | POA: Diagnosis not present

## 2016-06-17 DIAGNOSIS — I129 Hypertensive chronic kidney disease with stage 1 through stage 4 chronic kidney disease, or unspecified chronic kidney disease: Secondary | ICD-10-CM | POA: Diagnosis not present

## 2016-06-17 DIAGNOSIS — I428 Other cardiomyopathies: Secondary | ICD-10-CM | POA: Diagnosis not present

## 2016-06-17 LAB — BASIC METABOLIC PANEL
ANION GAP: 7 (ref 5–15)
BUN: 20 mg/dL (ref 6–20)
CALCIUM: 9.3 mg/dL (ref 8.9–10.3)
CO2: 24 mmol/L (ref 22–32)
Chloride: 106 mmol/L (ref 101–111)
Creatinine, Ser: 1.17 mg/dL — ABNORMAL HIGH (ref 0.44–1.00)
GFR, EST AFRICAN AMERICAN: 50 mL/min — AB (ref >60)
GFR, EST NON AFRICAN AMERICAN: 43 mL/min — AB (ref >60)
Glucose, Bld: 79 mg/dL (ref 65–99)
POTASSIUM: 3.9 mmol/L (ref 3.5–5.1)
Sodium: 137 mmol/L (ref 135–145)

## 2016-06-17 LAB — TROPONIN I
Troponin I: <0.03 ng/mL (ref <0.03)
Troponin I: <0.03 ng/mL (ref <0.03)

## 2016-06-17 LAB — NM MYOCAR MULTI W/SPECT W/WALL MOTION / EF
CHL CUP MPHR: 142 {beats}/min
CHL CUP NUCLEAR SDS: 4
CHL CUP NUCLEAR SRS: 14
CHL CUP NUCLEAR SSS: 18
CHL CUP RESTING HR STRESS: 65 {beats}/min
CSEPED: 5 min
LHR: 0.31
LV dias vol: 69 mL (ref 46–106)
LVSYSVOL: 26 mL
NUC STRESS TID: 1.18
Peak HR: 88 {beats}/min
Percent HR: 61 %

## 2016-06-17 LAB — CBC
HCT: 32.1 % — ABNORMAL LOW (ref 36.0–46.0)
HEMOGLOBIN: 10.1 g/dL — AB (ref 12.0–15.0)
MCH: 30 pg (ref 26.0–34.0)
MCHC: 31.5 g/dL (ref 30.0–36.0)
MCV: 95.3 fL (ref 78.0–100.0)
Platelets: 267 10*3/uL (ref 150–400)
RBC: 3.37 MIL/uL — AB (ref 3.87–5.11)
RDW: 13.9 % (ref 11.5–15.5)
WBC: 5.3 10*3/uL (ref 4.0–10.5)

## 2016-06-17 LAB — OCCULT BLOOD X 1 CARD TO LAB, STOOL: FECAL OCCULT BLD: NEGATIVE

## 2016-06-17 LAB — LIPID PANEL
Cholesterol: 145 mg/dL (ref 0–200)
HDL: 49 mg/dL (ref >40)
LDL CALC: 86 mg/dL (ref 0–99)
TRIGLYCERIDES: 52 mg/dL (ref <150)
Total CHOL/HDL Ratio: 3 RATIO
VLDL: 10 mg/dL (ref 0–40)

## 2016-06-17 LAB — LIPASE, BLOOD: Lipase: 17 U/L (ref 11–51)

## 2016-06-17 LAB — HEMOGLOBIN A1C
HEMOGLOBIN A1C: 4.9 % (ref 4.8–5.6)
MEAN PLASMA GLUCOSE: 94 mg/dL

## 2016-06-17 MED ORDER — TECHNETIUM TC 99M TETROFOSMIN IV KIT
30.0000 | PACK | Freq: Once | INTRAVENOUS | Status: AC | PRN
  Administered 2016-06-17: 30 via INTRAVENOUS

## 2016-06-17 MED ORDER — FENTANYL CITRATE (PF) 100 MCG/2ML IJ SOLN
INTRAMUSCULAR | Status: AC
  Administered 2016-06-17: 25 ug via INTRAVENOUS
  Filled 2016-06-17: qty 2

## 2016-06-17 MED ORDER — NITROGLYCERIN 0.4 MG SL SUBL
0.4000 mg | SUBLINGUAL_TABLET | Freq: Once | SUBLINGUAL | Status: AC
  Administered 2016-06-17: 0.4 mg via SUBLINGUAL

## 2016-06-17 MED ORDER — NITROGLYCERIN 2 % TD OINT
TOPICAL_OINTMENT | Freq: Two times a day (BID) | TRANSDERMAL | Status: DC
  Administered 2016-06-17: 16:00:00 via TOPICAL
  Filled 2016-06-17 (×2): qty 30

## 2016-06-17 MED ORDER — ACETAMINOPHEN 325 MG PO TABS
ORAL_TABLET | ORAL | Status: AC
Start: 2016-06-17 — End: 2016-06-17
  Filled 2016-06-17: qty 2

## 2016-06-17 MED ORDER — NITROGLYCERIN 0.4 MG SL SUBL
SUBLINGUAL_TABLET | SUBLINGUAL | Status: AC
  Administered 2016-06-17: 0.4 mg via SUBLINGUAL
  Filled 2016-06-17: qty 1

## 2016-06-17 MED ORDER — LOPERAMIDE HCL 2 MG PO CAPS
2.0000 mg | ORAL_CAPSULE | Freq: Once | ORAL | Status: AC | PRN
  Administered 2016-06-17: 2 mg via ORAL
  Filled 2016-06-17: qty 1

## 2016-06-17 MED ORDER — GI COCKTAIL ~~LOC~~
30.0000 mL | Freq: Once | ORAL | Status: AC
Start: 2016-06-17 — End: 2016-06-17
  Administered 2016-06-17: 30 mL via ORAL
  Filled 2016-06-17: qty 30

## 2016-06-17 MED ORDER — REGADENOSON 0.4 MG/5ML IV SOLN
0.4000 mg | Freq: Once | INTRAVENOUS | Status: AC
  Administered 2016-06-17: 0.4 mg via INTRAVENOUS
  Filled 2016-06-17: qty 5

## 2016-06-17 MED ORDER — FENTANYL CITRATE (PF) 100 MCG/2ML IJ SOLN
25.0000 ug | Freq: Once | INTRAMUSCULAR | Status: AC
  Administered 2016-06-17: 25 ug via INTRAVENOUS

## 2016-06-17 MED ORDER — LOPERAMIDE HCL 2 MG PO CAPS
2.0000 mg | ORAL_CAPSULE | Freq: Once | ORAL | Status: AC
  Administered 2016-06-17: 2 mg via ORAL
  Filled 2016-06-17: qty 1

## 2016-06-17 MED ORDER — FAMOTIDINE 20 MG PO TABS
20.0000 mg | ORAL_TABLET | Freq: Every day | ORAL | Status: DC | PRN
  Administered 2016-06-17: 20 mg via ORAL
  Filled 2016-06-17: qty 1

## 2016-06-17 MED ORDER — TECHNETIUM TC 99M TETROFOSMIN IV KIT
10.0000 | PACK | Freq: Once | INTRAVENOUS | Status: AC | PRN
  Administered 2016-06-17: 10 via INTRAVENOUS

## 2016-06-17 MED ORDER — REGADENOSON 0.4 MG/5ML IV SOLN
INTRAVENOUS | Status: AC
  Administered 2016-06-17: 0.4 mg via INTRAVENOUS
  Filled 2016-06-17: qty 5

## 2016-06-17 NOTE — Care Management Obs Status (Signed)
MEDICARE OBSERVATION STATUS NOTIFICATION   Patient Details  Name: Jennifer [name] MRN: [id number] Date of Birth: 01/19/1936   [facility] care Observation Status Notification Given:  Yes Obs message signed by patient's daughtrer who is POA.  Ninfa Meeker, RN 06/17/2016, 1:53 PM

## 2016-06-17 NOTE — Progress Notes (Signed)
Progress Note  Patient Name: Jennifer [name] Date of Encounter: 06/17/2016  Primary Cardiologist: Dr. Claiborne Billings  Subjective   Slept well without symptoms. No chest pain overnight. No dyspnea.  Tends to be cold natured - room is quite warm, patient says she likes it this way and always has. Daughter is in room also in a hospital gown but is not a patient; had to borrow a gown overnight.  Inpatient Medications    Scheduled Meds: . amLODipine  5 mg Oral QHS  . ARIPiprazole  2 mg Oral Daily  . aspirin  81 mg Oral Daily  . atorvastatin  40 mg Oral q1800  . buPROPion  150 mg Oral q morning - 10a  . carvedilol  12.5 mg Oral BID WC  . cycloSPORINE  1 drop Both Eyes BID  . fentaNYL  50 mcg Transdermal Q72H  . ferrous sulfate  325 mg Oral Once per day on Mon Wed Fri  . furosemide  20 mg Oral Daily  . gabapentin  300 mg Oral BID  . heparin  5,000 Units Subcutaneous Q8H  . HYDROcodone-acetaminophen  1 tablet Oral QPM  . hydroxypropyl methylcellulose / hypromellose  1 drop Both Eyes QID  . isosorbide mononitrate  60 mg Oral Daily  . latanoprost  1 drop Both Eyes QHS  . lidocaine  1 patch Transdermal Q2200  . LORazepam  0.5 mg Oral Daily  . LORazepam  1 mg Oral QHS  . losartan  25 mg Oral Daily  . Melatonin  3 mg Oral QHS  . pantoprazole  40 mg Oral BID  . sodium chloride flush  3 mL Intravenous Q12H  . vitamin B-12  50 mcg Oral Daily   Continuous Infusions: . sodium chloride     PRN Meds: sodium chloride, acetaminophen, alum & mag hydroxide-simeth, guaifenesin, magnesium hydroxide, nitroGLYCERIN, ondansetron (ZOFRAN) IV, polyethylene glycol, sodium chloride flush   Vital Signs    Vitals:   06/16/16 1730 06/16/16 1800 06/16/16 2221 06/17/16 0445  BP: (!) 128/52 (!) 115/48 (!) 122/54 (!) 142/66  Pulse: 77 75 72 82  Resp: 17 17 18 16   Temp:  99.1 F (37.3 C) 98.9 F (37.2 C) 98.3 F (36.8 C)  TempSrc:   Oral Oral  SpO2: 99% 98% 97% 98%  Weight:  204 lb 4.8 oz (92.7 kg)  213  lb 9.6 oz (96.9 kg)  Height:  5\' 4"  (1.626 m)      Intake/Output Summary (Last 24 hours) at 06/17/16 0857 Last data filed at 06/16/16 2348  Gross per 24 hour  Intake               60 ml  Output              500 ml  Net             -440 ml   Filed Weights   06/16/16 0824 06/16/16 1800 06/17/16 0445  Weight: 204 lb (92.5 kg) 204 lb 4.8 oz (92.7 kg) 213 lb 9.6 oz (96.9 kg)    Telemetry    NSR - Personally Reviewed  Physical Exam   GEN: No acute distress.  HEENT: Normocephalic, atraumatic, sclera non-icteric. Neck: No JVD. Cardiac: RRR no murmurs, rubs, or gallops.  Radials/DP/PT 1+ and equal bilaterally.  Respiratory: Clear to auscultation bilaterally. Breathing is unlabored. GI: Soft, nontender, non-distended, BS +x 4. MS: no deformity. Extremities: No clubbing or cyanosis. No edema. Distal pedal pulses are 2+ and equal bilaterally. Neuro:  AAOx3.  Follows commands. Psych:  Responds to questions appropriately with a normal affect.  Labs    Chemistry Recent Labs Lab 06/16/16 [phone] 06/16/16 1934 06/17/16 0633  NA 141  --  137  K 4.2  --  3.9  CL 107  --  106  CO2 27  --  24  GLUCOSE 88  --  79  BUN 23*  --  20  CREATININE 1.13* 1.10* 1.17*  CALCIUM 9.5  --  9.3  PROT  --  7.3  --   ALBUMIN  --  3.3*  --   AST  --  24  --   ALT  --  14  --   ALKPHOS  --  120  --   BILITOT  --  0.6  --   GFRNONAA 45* 47* 43*  GFRAA 53* 54* 50*  ANIONGAP 7  --  7     Hematology Recent Labs Lab 06/16/16 0923 06/16/16 1934 06/17/16 0633  WBC 5.4 6.0 5.3  RBC 3.42* 3.24* 3.37*  HGB 10.4* 10.1* 10.1*  HCT 32.8* 30.8* 32.1*  MCV 95.9 95.1 95.3  MCH 30.4 31.2 30.0  MCHC 31.7 32.8 31.5  RDW 13.9 14.1 13.9  PLT 279 259 267    Cardiac Enzymes Recent Labs Lab 06/16/16 1934 06/17/16 0117 06/17/16 0633  TROPONINI <0.03 <0.03 <0.03    Recent Labs Lab 06/16/16 0938 06/16/16 1412  TROPIPOC 0.01 0.00     BNPNo results for input(s): BNP, PROBNP in the last 168 hours.    DDimer No results for input(s): DDIMER in the last 168 hours.   Radiology    Dg Chest 2 View  Result Date: 06/16/2016 CLINICAL DATA:  Chest pain. EXAM: CHEST  2 VIEW COMPARISON:  11/27/2012 FINDINGS: Mild cardiomegaly and vascular congestion. No confluent opacities, effusions or edema. Degenerative changes in the thoracic spine. IMPRESSION: Mild cardiomegaly, vascular congestion. Electronically Signed   By: Rolm Baptise M.D.   On: 06/16/2016 09:04    Cardiac Studies   Pending  Patient Profile     81 y.o. female with normal coronaries 2006, NICM, LBBB, HTN, atypical chest pain, hiatal hernia, wheelchair bound who presented to [facility] with chest pain. Also noted 3 weeks ago had left arm weakness and garbled speech; evaluated by house physician at living facility at that time and treated with aspirin.   Assessment & Plan    1. Chest pain - plan nuc today. R/o for MI.  2. Possible TIA in 05/2016, soft right carotid bruit - outpatient carotid duplex recently ordered by Ignacia Bayley, pending at this time. Will review with MD whether to consider echo as well. Consider referral to neurology upon discharge to determine whether additional antiplatelet therapy needs to be considered such as Plavix. Can consider statin therapy given LDL 86 but also of note med list already is quite long and need to be cognizant of risk of polypharmacy.  3. HTN - controlled.  4. NICM - appears euvolemic on exam.  5. Renal insufficiency appears c/w CKD stage III - prior Cr near 1.00.  6. Mild anemia with h/o pernicious anemia in chart - generally stable this admission, prior values variable from 8-11- denies any bleeding. Will need to f/u PCP.  Signed, Charlie Pitter, PA-C  06/17/2016, 8:57 AM

## 2016-06-17 NOTE — Progress Notes (Signed)
     During Nuclear stress test patient had chest pain and shortness of breath that developed around the 2:30 second mark. She had flipped T-waves in her lateral and inferior leads, she was given nitro and her pain started to improve and her t-waves started to improve as well. Discussed case with Dr. Oval Linsey who recommended pain medication and nitro paste. Her BP was 98/56 therefore Morphine was withheld.  Pain has since improved.  Results from Nuclear Stress Test Still pending.

## 2016-06-17 NOTE — Progress Notes (Addendum)
Nuc result came back - Normal pharmacologic nuclear stress test with no evidence of prior infarct or ischemia. Called to give patient result, she says she still feels somewhat poorly ever since test. She had another episode of diarrhea about a half hour ago (which totals 2 episodes today, one prior to test). Reports some generalized abdominal discomfort. She also notes 6/10 chest discomfort but at the same time states "it's not real bad, I can just notice it." This is similar to what she felt on presentation. She is already on PPI BID.LFTs normal this admission. VSS.  Reviewed result with Dr. Oval Linsey who feels unlikely to be ischemia given normal troponins this admission and normal nuclear stress test (TW inversions nonspecific in setting of Lexiscan administration). Per our discussion, suspect possible GI process contributing to symptoms -  add lipase and f/u troponin to labs. Will try GI cocktail. Add PRN Zantac. Not sure NTG paste is doing anything at this point and she already has imdur on board, will hold the paste. If she has another loose stool can try another dose of loperamide. Will also add hemoccult. If discomfort persists may need GI eval in AM. Discussed plan with nurse, asked [place] to notify us for any escalating symptoms or change in patient status. Pt also agreeable to plan. Dayna Dunn PA-C

## 2016-06-17 NOTE — Progress Notes (Signed)
[facility] facility to confirm time of last Fentanyl patch application. Per patient, she becomes "sick to her stomach" if the patch is on too long or if it is not placed soon enough after the last one is removed. Facility last applied patch 5/1 at 0758.  Removed patch from L shoulder and replaced with new patch on R shoulder.

## 2016-06-17 NOTE — Progress Notes (Signed)
*  PRELIMINARY RESULTS* Vascular Ultrasound Carotid Duplex (Doppler) has been completed.  Preliminary findings: Bilateral: No significant (1-39%) ICA stenosis. Antegrade vertebral flow.     Landry Mellow, RDMS, RVT  06/17/2016, 4:30 PM

## 2016-06-18 DIAGNOSIS — I447 Left bundle-branch block, unspecified: Secondary | ICD-10-CM | POA: Diagnosis not present

## 2016-06-18 DIAGNOSIS — R079 Chest pain, unspecified: Secondary | ICD-10-CM | POA: Diagnosis not present

## 2016-06-18 DIAGNOSIS — I10 Essential (primary) hypertension: Secondary | ICD-10-CM | POA: Diagnosis not present

## 2016-06-18 DIAGNOSIS — N183 Chronic kidney disease, stage 3 (moderate): Secondary | ICD-10-CM

## 2016-06-18 DIAGNOSIS — R5381 Other malaise: Secondary | ICD-10-CM | POA: Diagnosis not present

## 2016-06-18 DIAGNOSIS — R0789 Other chest pain: Secondary | ICD-10-CM | POA: Diagnosis not present

## 2016-06-18 DIAGNOSIS — I428 Other cardiomyopathies: Secondary | ICD-10-CM

## 2016-06-18 LAB — CBC
HCT: 30.1 % — ABNORMAL LOW (ref 36.0–46.0)
Hemoglobin: 9.4 g/dL — ABNORMAL LOW (ref 12.0–15.0)
MCH: 29.7 pg (ref 26.0–34.0)
MCHC: 31.2 g/dL (ref 30.0–36.0)
MCV: 95.3 fL (ref 78.0–100.0)
PLATELETS: 266 10*3/uL (ref 150–400)
RBC: 3.16 MIL/uL — AB (ref 3.87–5.11)
RDW: 13.7 % (ref 11.5–15.5)
WBC: 5.5 10*3/uL (ref 4.0–10.5)

## 2016-06-18 LAB — COMPREHENSIVE METABOLIC PANEL
ALK PHOS: 106 U/L (ref 38–126)
ALT: 13 U/L — ABNORMAL LOW (ref 14–54)
ANION GAP: 13 (ref 5–15)
AST: 23 U/L (ref 15–41)
Albumin: 3.1 g/dL — ABNORMAL LOW (ref 3.5–5.0)
BUN: 24 mg/dL — ABNORMAL HIGH (ref 6–20)
CALCIUM: 9 mg/dL (ref 8.9–10.3)
CO2: 23 mmol/L (ref 22–32)
Chloride: 103 mmol/L (ref 101–111)
Creatinine, Ser: 1.32 mg/dL — ABNORMAL HIGH (ref 0.44–1.00)
GFR, EST AFRICAN AMERICAN: 44 mL/min — AB (ref >60)
GFR, EST NON AFRICAN AMERICAN: 38 mL/min — AB (ref >60)
Glucose, Bld: 70 mg/dL (ref 65–99)
Potassium: 3.8 mmol/L (ref 3.5–5.1)
SODIUM: 139 mmol/L (ref 135–145)
Total Bilirubin: 0.6 mg/dL (ref 0.3–1.2)
Total Protein: 7 g/dL (ref 6.5–8.1)

## 2016-06-18 MED ORDER — ISOSORBIDE MONONITRATE ER 60 MG PO TB24: 60.0000 mg | ORAL_TABLET | Freq: Every day | ORAL | 11 refills | Status: DC

## 2016-06-18 MED ORDER — HYDROCODONE-ACETAMINOPHEN 5-325 MG PO TABS: 1.0000 | ORAL_TABLET | Freq: Every evening | ORAL | Status: DC

## 2016-06-18 NOTE — NC FL2 (Signed)
[place] MEDICAID FL2 LEVEL OF CARE SCREENING TOOL     IDENTIFICATION  Patient Name: Jennifer [name] Birthdate: 11/07/1935 Sex: female Admission Date (Current Location): 06/16/2016  [facility] and [place] Number:  Herbalist and Address:  [facility] [place] [place] [address], [place], [place] [address]      Provider Number: [id number]  Attending Physician Name and Address:  Skeet Latch, MD  Relative Name and Phone Number:  [phone] Grand-dtr    Current Level of Care: Hospital Recommended Level of Care: [place] Prior Approval Number:    Date Approved/Denied:   PASRR Number: [id number]  Discharge Plan: Other (Comment) (ALF)    Current Diagnoses: Patient Active Problem List   Diagnosis Date Noted  . Debilitated patient 06/18/2016  . Chest pain at rest 06/16/2016  . LBBB (left bundle branch block)   . Lower extremity edema 01/25/2014  . Patellar tendon rupture 07/12/2013  . Constipation 07/02/2013  . Knee pain 06/28/2013  . Acute posthemorrhagic anemia 06/28/2013  . Senile dementia, uncomplicated 21/22/4825  . Right Mechanical complication of knee prosthesis 06/19/2013  . Failure of total knee arthroplasty ([place]) 06/19/2013  . Paget's disease of bone 11/29/2012  . Nausea alone 11/29/2012  . GERD (gastroesophageal reflux disease) 11/29/2012  . Nonischemic cardiomyopathy ([place]) 11/29/2012  . Anemia 11/29/2012  . Depression 11/29/2012  . Chest pain 11/28/2012  . Abdominal pain, epigastric 11/28/2012  . UTERINE FIBROID 04/13/2006  . HYPOTHYROIDISM, UNSPECIFIED 04/13/2006  . HYPOPARATHYROIDISM 04/13/2006  . HYPERLIPIDEMIA 04/13/2006  . OBESITY, NOS 04/13/2006  . Pernicious anemia 04/13/2006  . ANXIETY 04/13/2006  . DEPRESSIVE DISORDER, NOS 04/13/2006  . GLAUCOMA 04/13/2006  . Essential hypertension 04/13/2006  . GASTROESOPHAGEAL REFLUX, NO ESOPHAGITIS 04/13/2006  . CKD (chronic kidney disease) stage 3, GFR 30-59  ml/min 04/13/2006  . OSTEOARTHRITIS, LOWER LEG 04/13/2006  . ROTATOR CUFF TENDONITIS 04/13/2006  . OSTEOPOROSIS, UNSPECIFIED 04/13/2006  . COSTOCHONDRITIS 04/13/2006  . INSOMNIA NOS 04/13/2006  . PAIN, GENERALIZED 04/13/2006  . INCONTINENCE, URGE 04/13/2006    Orientation RESPIRATION BLADDER Height & Weight     Self, Time, Situation  Normal Continent Weight: 210 lb 4.8 oz (95.4 kg) Height:  5\' 4"  (162.6 cm)  BEHAVIORAL SYMPTOMS/MOOD NEUROLOGICAL BOWEL NUTRITION STATUS      Continent Diet (See DC Summary)  AMBULATORY STATUS COMMUNICATION OF NEEDS Skin   Limited Assist Verbally Normal                       Personal Care Assistance Level of Assistance  Bathing, Dressing Bathing Assistance: Limited assistance   Dressing Assistance: Limited assistance     Functional Limitations Info  Sight, Hearing, Speech Sight Info: Adequate Hearing Info: Adequate Speech Info: Adequate    SPECIAL CARE FACTORS FREQUENCY  PT (By licensed PT), OT (By licensed OT)     PT Frequency: 5x wk OT Frequency: 5x wk            Contractures Contractures Info: Not present    Additional Factors Info  Code Status, Allergies Code Status Info: Full Code Allergies Info: Codeine           Current Medications (06/18/2016):  This is the current hospital active medication list Current Facility-Administered Medications  Medication Dose Route Frequency Provider Last Rate Last Dose  . 0.9 %  sodium chloride infusion  250 mL Intravenous PRN Isaiah Serge, NP      . acetaminophen (TYLENOL) tablet 650 mg  650 mg Oral  Q4H PRN Isaiah Serge, NP   650 mg at 06/18/16 0910  . alum & mag hydroxide-simeth (MAALOX/MYLANTA) 200-200-20 MG/5ML suspension 30 mL  30 mL Oral Q6H PRN Isaiah Serge, NP      . amLODipine (NORVASC) tablet 5 mg  5 mg Oral QHS Isaiah Serge, NP   5 mg at 06/17/16 2054  . ARIPiprazole (ABILIFY) tablet 2 mg  2 mg Oral Daily Isaiah Serge, NP   2 mg at 06/18/16 7628  . aspirin  chewable tablet 81 mg  81 mg Oral Daily Isaiah Serge, NP   81 mg at 06/18/16 3151  . atorvastatin (LIPITOR) tablet 40 mg  40 mg Oral q1800 Isaiah Serge, NP   40 mg at 06/17/16 1707  . buPROPion (WELLBUTRIN XL) 24 hr tablet 150 mg  150 mg Oral q morning - 10a Isaiah Serge, NP   150 mg at 06/18/16 0905  . carvedilol (COREG) tablet 12.5 mg  12.5 mg Oral BID WC Isaiah Serge, NP   12.5 mg at 06/18/16 0906  . cycloSPORINE (RESTASIS) 0.05 % ophthalmic emulsion 1 drop  1 drop Both Eyes BID Isaiah Serge, NP   1 drop at 06/18/16 7616  . famotidine (PEPCID) tablet 20 mg  20 mg Oral Daily PRN Melina Copa N, PA-C   20 mg at 06/17/16 1829  . fentaNYL (DURAGESIC - dosed mcg/hr) 50 mcg  50 mcg Transdermal Q72H Isaiah Serge, NP   50 mcg at 06/17/16 1739  . ferrous sulfate tablet 325 mg  325 mg Oral Once per day on Mon Wed Fri Isaiah Serge, NP   325 mg at 06/17/16 0737  . furosemide (LASIX) tablet 20 mg  20 mg Oral Daily Isaiah Serge, NP   20 mg at 06/18/16 1062  . gabapentin (NEURONTIN) capsule 300 mg  300 mg Oral BID Isaiah Serge, NP   300 mg at 06/18/16 6948  . guaifenesin (ROBITUSSIN) 100 MG/5ML syrup 200 mg  200 mg Oral Q6H PRN Isaiah Serge, NP      . heparin injection 5,000 Units  5,000 Units Subcutaneous Q8H Isaiah Serge, NP   5,000 Units at 06/18/16 [phone]  . HYDROcodone-acetaminophen (NORCO/VICODIN) 5-325 MG per tablet 1 tablet  1 tablet Oral QPM Isaiah Serge, NP   1 tablet at 06/17/16 1707  . hydroxypropyl methylcellulose / hypromellose (ISOPTO TEARS / GONIOVISC) 2.5 % ophthalmic solution 1 drop  1 drop Both Eyes QID Isaiah Serge, NP   1 drop at 06/18/16 1218  . isosorbide mononitrate (IMDUR) 24 hr tablet 60 mg  60 mg Oral Daily Isaiah Serge, NP   60 mg at 06/18/16 7035  . latanoprost (XALATAN) 0.005 % ophthalmic solution 1 drop  1 drop Both Eyes QHS Isaiah Serge, NP   1 drop at 06/17/16 2052  . lidocaine (LIDODERM) 5 % 1 patch  1 patch Transdermal Q2200 Isaiah Serge, NP   1 patch at 06/17/16 2051  . LORazepam (ATIVAN) tablet 0.5 mg  0.5 mg Oral Daily Cecilie Kicks R, NP   0.5 mg at 06/18/16 0093  . LORazepam (ATIVAN) tablet 1 mg  1 mg Oral QHS Skeet Latch, MD   1 mg at 06/17/16 2054  . losartan (COZAAR) tablet 25 mg  25 mg Oral Daily Isaiah Serge, NP   25 mg at 06/18/16 8182  . magnesium hydroxide (MILK OF MAGNESIA) suspension 30 mL  30 mL Oral  QHS PRN Isaiah Serge, NP      . Melatonin TABS 3 mg  3 mg Oral QHS Isaiah Serge, NP   3 mg at 06/17/16 2053  . nitroGLYCERIN (NITROSTAT) SL tablet 0.4 mg  0.4 mg Sublingual Q5 Min x 3 PRN Isaiah Serge, NP      . ondansetron [facility]) injection 4 mg  4 mg Intravenous Q6H PRN Isaiah Serge, NP      . pantoprazole (PROTONIX) EC tablet 40 mg  40 mg Oral BID Isaiah Serge, NP   40 mg at 06/18/16 0906  . polyethylene glycol (MIRALAX / GLYCOLAX) packet 17 g  17 g Oral Daily PRN Cecilie Kicks R, NP      . sodium chloride flush (NS) 0.9 % injection 3 mL  3 mL Intravenous Q12H Isaiah Serge, NP   3 mL at 06/17/16 2055  . sodium chloride flush (NS) 0.9 % injection 3 mL  3 mL Intravenous PRN Isaiah Serge, NP      . vitamin B-12 (CYANOCOBALAMIN) tablet 50 mcg  50 mcg Oral Daily Isaiah Serge, NP   50 mcg at 06/18/16 2080     Discharge Medications: Please see discharge summary for a list of discharge medications.  Relevant Imaging Results:  Relevant Lab Results:   Additional Information [id number]  Linton Flemings B, LCSWA

## 2016-06-18 NOTE — Clinical Social Work Note (Signed)
Clinical Social Worker facilitated patient discharge including contacting patient family (Trice) and facility ([facility]) to confirm patient discharge plans. Clinical information faxed to facility and family agreeable with plan. CSW arranged ambulance transport via [place] to [facility]. RN to call report prior to discharge.  Clinical Social Worker will sign off for now as social work intervention is no longer needed. Please consult [place] again if new need arises.  Effa Yarrow B. Joline Maxcy Clinical Social Work Dept Weekend Social Worker [phone] 1:28 PM

## 2016-06-18 NOTE — Clinical Social Work Placement (Signed)
   CLINICAL SOCIAL WORK PLACEMENT  NOTE  Date:  06/18/2016  Patient Details  Name: Jennifer [name] MRN: [id number] Date of Birth: 03/14/1935  Clinical Social Work is seeking post-discharge placement for this patient at the [place] level of care (*CSW will initial, date and re-position this form in  chart as items are completed):  Yes   Patient/family provided with [place] Work Department's list of facilities offering this level of care within the geographic area requested by the patient (or if unable, by the patient's family).  Yes   Patient/family informed of their freedom to choose among providers that offer the needed level of care, that participate in Medicare, Medicaid or managed care program needed by the patient, have an available bed and are willing to accept the patient.  Yes   Patient/family informed of [place]'s ownership interest in [facility] and [facility], as well as of the fact that they are under no obligation to receive care at these facilities.  PASRR submitted to EDS on       PASRR number received on       Existing PASRR number confirmed on 06/18/16     FL2 transmitted to all facilities in geographic area requested by pt/family on       FL2 transmitted to all facilities within larger geographic area on       Patient informed that his/her managed care company has contracts with or will negotiate with certain facilities, including the following:            Patient/family informed of bed offers received.  Patient chooses bed at  (Pt from [facility])     Physician recommends and patient chooses bed at      Patient to be transferred to  (Pt from [place]) on 06/18/16.  Patient to be transferred to facility by  [facility])     Patient family notified on 06/18/16 of transfer.  Name of family member notified:  Grand-dtr Trice     PHYSICIAN Please sign FL2     Additional Comment:     _______________________________________________ Serafina Mitchell, LCSWA 06/18/2016, 1:31 PM

## 2016-06-18 NOTE — Progress Notes (Signed)
Progress Note  Patient Name: Jennifer [name] Date of Encounter: 06/18/2016  Primary Cardiologist: Dr Claiborne Billings  Subjective   Feeling well. Stomach pain is much better. She denies any chest pain today. No longer having nausea or vomiting. Last episode of diarrhea was last night.  Inpatient Medications    Scheduled Meds: . amLODipine  5 mg Oral QHS  . ARIPiprazole  2 mg Oral Daily  . aspirin  81 mg Oral Daily  . atorvastatin  40 mg Oral q1800  . buPROPion  150 mg Oral q morning - 10a  . carvedilol  12.5 mg Oral BID WC  . cycloSPORINE  1 drop Both Eyes BID  . fentaNYL  50 mcg Transdermal Q72H  . ferrous sulfate  325 mg Oral Once per day on Mon Wed Fri  . furosemide  20 mg Oral Daily  . gabapentin  300 mg Oral BID  . heparin  5,000 Units Subcutaneous Q8H  . HYDROcodone-acetaminophen  1 tablet Oral QPM  . hydroxypropyl methylcellulose / hypromellose  1 drop Both Eyes QID  . isosorbide mononitrate  60 mg Oral Daily  . latanoprost  1 drop Both Eyes QHS  . lidocaine  1 patch Transdermal Q2200  . LORazepam  0.5 mg Oral Daily  . LORazepam  1 mg Oral QHS  . losartan  25 mg Oral Daily  . Melatonin  3 mg Oral QHS  . pantoprazole  40 mg Oral BID  . sodium chloride flush  3 mL Intravenous Q12H  . vitamin B-12  50 mcg Oral Daily   Continuous Infusions: . sodium chloride     PRN Meds: sodium chloride, acetaminophen, alum & mag hydroxide-simeth, famotidine, guaifenesin, magnesium hydroxide, nitroGLYCERIN, ondansetron (ZOFRAN) IV, polyethylene glycol, sodium chloride flush   Vital Signs    Vitals:   06/17/16 1355 06/17/16 1455 06/17/16 2000 06/18/16 0441  BP: (!) 109/50 (!) 119/59 (!) 116/46 (!) 108/51  Pulse: 77 71 62 63  Resp:  12 14 13   Temp:  98.1 F (36.7 C) 98.5 F (36.9 C) 98.7 F (37.1 C)  TempSrc:  Oral Oral Oral  SpO2:  96% 95% 96%  Weight:    210 lb 4.8 oz (95.4 kg)  Height:        Intake/Output Summary (Last 24 hours) at 06/18/16 0958 Last data filed at 06/17/16  2055  Gross per 24 hour  Intake               53 ml  Output              300 ml  Net             -247 ml   Filed Weights   06/16/16 1800 06/17/16 0445 06/18/16 0441  Weight: 204 lb 4.8 oz (92.7 kg) 213 lb 9.6 oz (96.9 kg) 210 lb 4.8 oz (95.4 kg)    Telemetry    No events - Personally Reviewed  ECG    06/17/16- NSR, LBBB - Personally Reviewed  Physical Exam   GEN: No acute distress.   Neck: No JVD Cardiac: RRR, no murmurs, rubs, or gallops.  Respiratory: Clear to auscultation bilaterally. GI: Soft, nontender, non-distended  MS: No edema; No deformity. Neuro:  Nonfocal  Psych: Normal affect   Labs    Chemistry Recent Labs Lab 06/16/16 0923 06/16/16 1934 06/17/16 0633 06/18/16 0346  NA 141  --  137 139  K 4.2  --  3.9 3.8  CL 107  --  106 103  CO2 27  --  24 23  GLUCOSE 88  --  79 70  BUN 23*  --  20 24*  CREATININE 1.13* 1.10* 1.17* 1.32*  CALCIUM 9.5  --  9.3 9.0  PROT  --  7.3  --  7.0  ALBUMIN  --  3.3*  --  3.1*  AST  --  24  --  23  ALT  --  14  --  13*  ALKPHOS  --  120  --  106  BILITOT  --  0.6  --  0.6  GFRNONAA 45* 47* 43* 38*  GFRAA 53* 54* 50* 44*  ANIONGAP 7  --  7 13     Hematology Recent Labs Lab 06/16/16 1934 06/17/16 0633 06/18/16 0346  WBC 6.0 5.3 5.5  RBC 3.24* 3.37* 3.16*  HGB 10.1* 10.1* 9.4*  HCT 30.8* 32.1* 30.1*  MCV 95.1 95.3 95.3  MCH 31.2 30.0 29.7  MCHC 32.8 31.5 31.2  RDW 14.1 13.9 13.7  PLT 259 267 266     Radiology    CHEST  2 VIEW 06/16/16  COMPARISON:  11/27/2012  FINDINGS: Mild cardiomegaly and vascular congestion. No confluent opacities, effusions or edema. Degenerative changes in the thoracic spine.  IMPRESSION: Mild cardiomegaly, vascular congestion.   Cardiac Studies   Nm Myocar Multi W/spect W/wall Motion / Ef  Result Date: 06/17/2016  ST segment depression was noted during stress in the I, II and aVF leads, beginning at 3 minutes of stress, ending at 3 minutes of stress, and returning to  baseline after 5-9 minutes of recovery.  T wave inversion was noted during stress. T wave inversion persisted.  The study is normal.  This is a low risk study.  The left ventricular ejection fraction is normal (55-65%).  Normal pharmacologic nuclear stress test with no evidence of prior infarct or ischemia.    Patient Profile     81 y.o. female with a history of NICM with subsequent normalization of LV function, normal coronary arteries inn'06, LBBB, HTN, atypical chest pain, hiatal hernia, and osteoporosis. Her last echo in October 2014 showed an EF of 45-50% with septal motion abnormality most likely consistent with LBBB. Stress test was undertaken in the setting of chest discomfort in April 2015, and this showed no evidence of ischemia and normal LV function. She is status post bilateral knee replacements in the past and is wheel chair bound. Pt admitted 06/16/16 with chest pain-Troponin negative, Myoview low risk.    Assessment & Plan    Chest pain,  MI r/o. Hx of patent cors in 2006 with neg nuc in 2015, and negative Myoview this admission.   Stable for discharge.  Hx of NICM last echo 2015 with EF 45-50% improved from 35%.  She is euvolemic on exam.   Recent hx of possible TIA Rt carotid bruit on exam,  carotid dopplers are pending   HTN, Essential BP stable to mildly elevated.  Continue amlodipine, carvedilol, losartan, and Imdur.  Chronic LBBB  bil knee replacements with chronic pain on fentanyl patch. Wheelchair bound.   Plan- Discharge home today.  Signed, Raekwon Winkowski C. Oval Linsey, MD, [facility]  06/18/2016  11:44 AM

## 2016-06-18 NOTE — Plan of Care (Signed)
Problem: Skin Integrity: Goal: Risk for impaired skin integrity will decrease Outcome: Not Progressing Pt does not want to get up to use BSC.

## 2016-06-18 NOTE — Discharge Summary (Signed)
Discharge Summary    Patient ID: Jennifer [name],  MRN: [id number], DOB/AGE: [birth year] 81 y.o.  Admit date: 06/16/2016 Discharge date: 06/18/2016  Primary Care Provider: Reymundo Poll Primary Cardiologist: Dr Claiborne Billings  Discharge Diagnoses    Principal Problem:   Debilitated patient Active Problems:   Essential hypertension   CKD (chronic kidney disease) stage 3, GFR 30-59 ml/min   Nonischemic cardiomyopathy (HCC)   Chest pain at rest   LBBB (left bundle branch block)   Allergies Allergies  Allergen Reactions  . Codeine Other (See Comments)    Makes her sick on her stomach.    Diagnostic Studies/Procedures    Myoview 06/17/16 _____________   History of Present Illness     81 y/o SNF pt admitted with chest pain  Hospital Course     81 y.o. female with a history of NICM with subsequent normalization of LV function, normal coronary arteries '06, LBBB, HTN, atypical chest pain, hiatal hernia, and osteoporosis. She is s/p bilateral knee replacements but is basically wheel chair bound and is a resident of SNF. Her last echo in October 2014 showed an EF of 45-50% with septal motion abnormality most likely consistent with LBBB. Stress test was undertaken in the setting of chest discomfort in April 2015, and this showed no evidence of ischemia and normal LV function.  Pt admitted 06/16/16 with chest pain-Troponin negative, Myoview low risk.  Dr Oval Linsey feels she is stable for discharge 06/18/16.   _____________  Discharge Vitals Blood pressure (!) 108/51, pulse 63, temperature 98.7 F (37.1 C), temperature source Oral, resp. rate 13, height 5\' 4"  (1.626 m), weight 210 lb 4.8 oz (95.4 kg), SpO2 96 %.  Filed Weights   06/16/16 1800 06/17/16 0445 06/18/16 0441  Weight: 204 lb 4.8 oz (92.7 kg) 213 lb 9.6 oz (96.9 kg) 210 lb 4.8 oz (95.4 kg)    Labs & Radiologic Studies    CBC  Recent Labs  06/17/16 0633 06/18/16 0346  WBC 5.3 5.5  HGB 10.1* 9.4*  HCT 32.1* 30.1*  MCV 95.3  95.3  PLT 267 681   Basic Metabolic Panel  Recent Labs  06/16/16 1934 06/17/16 0633 06/18/16 0346  NA  --  137 139  K  --  3.9 3.8  CL  --  106 103  CO2  --  24 23  GLUCOSE  --  79 70  BUN  --  20 24*  CREATININE 1.10* 1.17* 1.32*  CALCIUM  --  9.3 9.0  MG 2.1  --   --    Liver Function Tests  Recent Labs  06/16/16 1934 06/18/16 0346  AST 24 23  ALT 14 13*  ALKPHOS 120 106  BILITOT 0.6 0.6  PROT 7.3 7.0  ALBUMIN 3.3* 3.1*    Recent Labs  06/17/16 1801  LIPASE 17   Cardiac Enzymes  Recent Labs  06/17/16 0117 06/17/16 0633 06/17/16 1801  TROPONINI <0.03 <0.03 <0.03   BNP Invalid input(s): POCBNP D-Dimer No results for input(s): DDIMER in the last 72 hours. Hemoglobin A1C  Recent Labs  06/16/16 1934  HGBA1C 4.9   Fasting Lipid Panel  Recent Labs  06/17/16 0117  CHOL 145  HDL 49  LDLCALC 86  TRIG 52  CHOLHDL 3.0   Thyroid Function Tests  Recent Labs  06/16/16 1934  TSH 2.958   _____________  Dg Chest 2 View  Result Date: 06/16/2016 CLINICAL DATA:  Chest pain. EXAM: CHEST  2 VIEW COMPARISON:  11/27/2012 FINDINGS:  Mild cardiomegaly and vascular congestion. No confluent opacities, effusions or edema. Degenerative changes in the thoracic spine. IMPRESSION: Mild cardiomegaly, vascular congestion. Electronically Signed   By: Rolm Baptise M.D.   On: 06/16/2016 09:04   Nm Myocar Multi W/spect W/wall Motion / Ef  Result Date: 06/17/2016  ST segment depression was noted during stress in the I, II and aVF leads, beginning at 3 minutes of stress, ending at 3 minutes of stress, and returning to baseline after 5-9 minutes of recovery.  T wave inversion was noted during stress. T wave inversion persisted.  The study is normal.  This is a low risk study.  The left ventricular ejection fraction is normal (55-65%).  Normal pharmacologic nuclear stress test with no evidence of prior infarct or ischemia.   Disposition   Pt is being discharged home  today in good condition.  Follow-up Plans & Appointments    Follow-up Information    Barrett, Evelene Croon, PA-C Follow up.   Specialties:  Cardiology, Radiology Why:  [facility] - 06/30/16 at 10am. Arrive 15 minutes early to check in. Suanne Marker is one of the PAs that works with Dr. Claiborne Billings. Contact information: [address] [place] [address] [phone]            Discharge Medications   Current Discharge Medication List    CONTINUE these medications which have CHANGED   Details  HYDROcodone-acetaminophen (NORCO/VICODIN) 5-325 MG tablet Take 1 tablet by mouth every evening.   Associated Diagnoses: Patellar tendon rupture    isosorbide mononitrate (IMDUR) 60 MG 24 hr tablet Take 1 tablet (60 mg total) by mouth daily. Qty: 60 tablet, Refills: 11      CONTINUE these medications which have NOT CHANGED   Details  acetaminophen (TYLENOL) 500 MG tablet Take 500 mg by mouth every 4 (four) hours as needed for mild pain.    alum & mag hydroxide-simeth (MINTOX) 660-630-16 MG/5ML suspension Take 30 mLs by mouth every 6 (six) hours as needed for indigestion or heartburn.    amLODipine (NORVASC) 5 MG tablet Take 5 mg by mouth at bedtime.    ARIPiprazole (ABILIFY) 2 MG tablet Take 2 mg by mouth daily.    aspirin 81 MG chewable tablet Chew 81 mg by mouth daily.    buPROPion (WELLBUTRIN XL) 150 MG 24 hr tablet Take 150 mg by mouth every morning.    carboxymethylcellulose (REFRESH TEARS) 0.5 % SOLN Place 1 drop into both eyes 4 (four) times daily.    carvedilol (COREG) 12.5 MG tablet Take 12.5 mg by mouth 2 (two) times daily with a meal.    cycloSPORINE (RESTASIS) 0.05 % ophthalmic emulsion Place 1 drop into both eyes 2 (two) times daily.    diclofenac sodium (VOLTAREN) 1 % GEL Apply 2 g topically 4 (four) times daily. Rub into affected area of foot 2 to 4 times daily Qty: 100 g, Refills: 2    fentaNYL (DURAGESIC - DOSED MCG/HR) 50 MCG/HR Place 50 mcg onto the skin  every 3 (three) days.    ferrous sulfate 325 (65 FE) MG tablet Take 325 mg by mouth 3 (three) times a week. On Mon, Wed, and Fri.    furosemide (LASIX) 20 MG tablet Take 1 tablet (20 mg total) by mouth daily. Qty: 30 tablet, Refills: 6    gabapentin (NEURONTIN) 300 MG capsule Take 1 capsule by mouth 2 (two) times daily.    guaifenesin (ROBITUSSIN) 100 MG/5ML syrup Take 200 mg by mouth every 6 (six)  hours as needed for cough.    latanoprost (XALATAN) 0.005 % ophthalmic solution Place 1 drop into both eyes at bedtime.    Lidocaine (ASPERCREME LIDOCAINE) 4 % PTCH Apply 1 patch topically daily. Apply patch in evening and remove in the morning    LORazepam (ATIVAN) 0.5 MG tablet Take 0.5-1 mg by mouth 2 (two) times daily. Take 1 tablet in the morning and 2 tablets at bedtime    losartan (COZAAR) 25 MG tablet Take 1 tablet by mouth daily.    magnesium hydroxide (MILK OF MAGNESIA) 400 MG/5ML suspension Take 30 mLs by mouth at bedtime as needed for mild constipation.     Melatonin 3 MG TABS Take 3 mg by mouth at bedtime.    Multiple Vitamins-Minerals (CEROVITE ADVANCED FORMULA PO) Take by mouth daily.    neomycin-bacitracin-polymyxin (NEOSPORIN) ointment Apply 1 application topically as needed for wound care. apply to eye    pantoprazole (PROTONIX) 40 MG tablet Take 40 mg by mouth daily.    polyethylene glycol (MIRALAX / GLYCOLAX) packet Take 17 g by mouth daily as needed for mild constipation.    vitamin B-12 (CYANOCOBALAMIN) 50 MCG tablet Take 50 mcg by mouth daily.    NONFORMULARY OR COMPOUNDED ITEM [facility]:  "Authorized Substitute" Pain Cream Formulation - Ibuprofen 15%, Gaclofen 1%, Gabapentin 3%, Lidocaine 2%, apply 2 grams to affected area 3 times daily. Qty: 120 each, Refills: 2      STOP taking these medications     methocarbamol (ROBAXIN) 500 MG tablet         Outstanding Labs/Studies   The pt had carotid dopplers done- results are pending at discharge and  will need to be f/u as an OP.   Duration of Discharge Encounter   Greater than 30 minutes including physician time.  Angelena Form PA 06/18/2016, 11:54 AM

## 2016-06-19 LAB — VAS US CAROTID
LEFT ECA DIAS: -5 cm/s
LEFT VERTEBRAL DIAS: -15 cm/s
LICADSYS: -78 cm/s
LICAPDIAS: 24 cm/s
LICAPSYS: 74 cm/s
Left CCA dist dias: -16 cm/s
Left CCA dist sys: -76 cm/s
Left CCA prox dias: 14 cm/s
Left CCA prox sys: 117 cm/s
Left ICA dist dias: -17 cm/s
RCCAPDIAS: 18 cm/s
RCCAPSYS: 202 cm/s
RIGHT ECA DIAS: -11 cm/s
RIGHT VERTEBRAL DIAS: 9 cm/s
Right cca dist sys: -59 cm/s

## 2016-06-21 ENCOUNTER — Emergency Department (HOSPITAL_COMMUNITY): Payer: Medicare Other

## 2016-06-21 ENCOUNTER — Encounter (HOSPITAL_COMMUNITY): Payer: Self-pay

## 2016-06-21 ENCOUNTER — Emergency Department (HOSPITAL_COMMUNITY)
Admission: EM | Admit: 2016-06-21 | Discharge: 2016-06-21 | Disposition: A | Discharge status: Home / Self Care | Payer: Medicare Other | Attending: Emergency Medicine | Admitting: Emergency Medicine

## 2016-06-21 DIAGNOSIS — E039 Hypothyroidism, unspecified: Secondary | ICD-10-CM | POA: Insufficient documentation

## 2016-06-21 DIAGNOSIS — R072 Precordial pain: Secondary | ICD-10-CM

## 2016-06-21 DIAGNOSIS — Z7982 Long term (current) use of aspirin: Secondary | ICD-10-CM | POA: Diagnosis not present

## 2016-06-21 DIAGNOSIS — N183 Chronic kidney disease, stage 3 (moderate): Secondary | ICD-10-CM | POA: Diagnosis not present

## 2016-06-21 DIAGNOSIS — Z96653 Presence of artificial knee joint, bilateral: Secondary | ICD-10-CM | POA: Diagnosis not present

## 2016-06-21 DIAGNOSIS — I129 Hypertensive chronic kidney disease with stage 1 through stage 4 chronic kidney disease, or unspecified chronic kidney disease: Secondary | ICD-10-CM | POA: Diagnosis not present

## 2016-06-21 DIAGNOSIS — R4781 Slurred speech: Secondary | ICD-10-CM

## 2016-06-21 DIAGNOSIS — Z8673 Personal history of transient ischemic attack (TIA), and cerebral infarction without residual deficits: Secondary | ICD-10-CM | POA: Diagnosis not present

## 2016-06-21 DIAGNOSIS — R079 Chest pain, unspecified: Secondary | ICD-10-CM | POA: Diagnosis present

## 2016-06-21 LAB — BASIC METABOLIC PANEL
ANION GAP: 8 (ref 5–15)
BUN: 37 mg/dL — ABNORMAL HIGH (ref 6–20)
CALCIUM: 9.4 mg/dL (ref 8.9–10.3)
CO2: 24 mmol/L (ref 22–32)
Chloride: 106 mmol/L (ref 101–111)
Creatinine, Ser: 1.71 mg/dL — ABNORMAL HIGH (ref 0.44–1.00)
GFR, EST AFRICAN AMERICAN: 32 mL/min — AB (ref >60)
GFR, EST NON AFRICAN AMERICAN: 27 mL/min — AB (ref >60)
GLUCOSE: 94 mg/dL (ref 65–99)
Potassium: 4.1 mmol/L (ref 3.5–5.1)
SODIUM: 138 mmol/L (ref 135–145)

## 2016-06-21 LAB — CBC
HCT: 33.2 % — ABNORMAL LOW (ref 36.0–46.0)
HEMOGLOBIN: 10.7 g/dL — AB (ref 12.0–15.0)
MCH: 31 pg (ref 26.0–34.0)
MCHC: 32.2 g/dL (ref 30.0–36.0)
MCV: 96.2 fL (ref 78.0–100.0)
Platelets: 211 10*3/uL (ref 150–400)
RBC: 3.45 MIL/uL — ABNORMAL LOW (ref 3.87–5.11)
RDW: 13.7 % (ref 11.5–15.5)
WBC: 4.5 10*3/uL (ref 4.0–10.5)

## 2016-06-21 LAB — I-STAT TROPONIN, ED
TROPONIN I, POC: 0 ng/mL (ref 0.00–0.08)
Troponin i, poc: 0 ng/mL (ref 0.00–0.08)

## 2016-06-21 NOTE — ED Triage Notes (Signed)
CO of chest pain that she can't really describe but states that it radiates to the L arm. Some SOB, lightheadedness, and nausea. Denies pain.

## 2016-06-21 NOTE — ED Provider Notes (Signed)
[facility] DEPT Provider Note   CSN: [id number] Arrival date & time: 06/21/16  1740  History   Chief Complaint Chief Complaint  Patient presents with  . Chest Pain    HPI Jennifer [name] is a 81 y.o. female.  Patient is a poor historian. She was admitted approximately 3 days ago. Had an ACS rule out at that time including a nuclear med study: ST segment depression was noted during stress in the I, II and aVF leads, beginning at 3 minutes of stress, ending at 3 minutes of stress, and returning to baseline after 5-9 minutes of recovery. T wave inversion was noted during stress. T wave inversion persisted. The study is normal. This is a low risk study. The left ventricular ejection fraction is normal (55-65%).  After she was cleared from the nuclear med study, felt appropriate for discharge. There was mention in the initial history and physical admission note about symptoms consistent with possible TIA and doing a workup for TIA at that time. She had an echocardiogram as well as carotid Dopplers while inpatient that did not show any evidence of acute pathology. Patient reports she was now at her facility, had onset of chest pain radiating into her left upper extremity around 2 PM, as well as some slurred speech. EMS was called. She reports her symptoms have now largely resolved.   The history is provided by the patient and medical records.  Illness  This is a new problem. The current episode started 3 to 5 hours ago. The problem occurs constantly. The problem has been gradually improving. Associated symptoms include chest pain. Pertinent negatives include no abdominal pain, no headaches and no shortness of breath.    Past Medical History:  Diagnosis Date  . Anemia   . Anxiety   . Arthritis   . Atypical chest pain    a. 2006 Cath: nl cors;  b. 05/2010 MV: no ischemia;  c. 05/2013 MV: nl EF, no ischemia.  . Blood transfusion   . Cardiomyopathy, nonischemic ([place])    a. 2006 EF  initially 30%;  b. 10/2009 Echo EF now 50-55%; c. 11/2012 Echo: EF 45-50% w/ septal motion abnormality (LBBB), gr1 DD, mod TR, PASP 36mmHg; d. 05/2013 MV: Nl EF by SPECT.  . Constipation due to pain medication   . Dementia   . Depression   . Diarrhea   . Frequency of urination    at night  . GERD (gastroesophageal reflux disease)   . Glaucoma   . Hypertension   . LBBB (left bundle branch block)   . Osteoporosis 04/13/2006  . Paget's disease of bone 11/29/2012  . Psychosis   . Renal disorder    only has one kidney; Right kidney stopped working after last child was born  . Seizures ([place])    approximately 20 years since last seizure  . Swelling of both ankles    Takes Lasix if needed  . Thyroid disease 04/13/2006   hypothyroidism  . TIA (transient ischemic attack)     Patient Active Problem List   Diagnosis Date Noted  . Debilitated patient 06/18/2016  . Chest pain at rest 06/16/2016  . LBBB (left bundle branch block)   . Lower extremity edema 01/25/2014  . Patellar tendon rupture 07/12/2013  . Constipation 07/02/2013  . Knee pain 06/28/2013  . Acute posthemorrhagic anemia 06/28/2013  . Senile dementia, uncomplicated 52/77/8242  . Right Mechanical complication of knee prosthesis 06/19/2013  . 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  Paget's disease of bone 11/29/2012  . Nausea alone 11/29/2012  . GERD (gastroesophageal reflux disease) 11/29/2012  . Nonischemic cardiomyopathy ([place]) 11/29/2012  . Anemia 11/29/2012  . Depression 11/29/2012  . Chest pain 11/28/2012  . Abdominal pain, epigastric 11/28/2012  . UTERINE FIBROID 04/13/2006  . HYPOTHYROIDISM, UNSPECIFIED 04/13/2006  . HYPOPARATHYROIDISM 04/13/2006  . HYPERLIPIDEMIA 04/13/2006  . OBESITY, NOS 04/13/2006  . Pernicious anemia 04/13/2006  . ANXIETY 04/13/2006  . DEPRESSIVE DISORDER, NOS 04/13/2006  . GLAUCOMA 04/13/2006  . Essential hypertension 04/13/2006  . GASTROESOPHAGEAL REFLUX, NO ESOPHAGITIS  04/13/2006  . CKD (chronic kidney disease) stage 3, GFR 30-59 ml/min 04/13/2006  . OSTEOARTHRITIS, LOWER LEG 04/13/2006  . ROTATOR CUFF TENDONITIS 04/13/2006  . OSTEOPOROSIS, UNSPECIFIED 04/13/2006  . COSTOCHONDRITIS 04/13/2006  . INSOMNIA NOS 04/13/2006  . PAIN, GENERALIZED 04/13/2006  . INCONTINENCE, URGE 04/13/2006    Past Surgical History:  Procedure Laterality Date  . BREAST LUMPECTOMY Left    x 2  . CARDIAC CATHETERIZATION    . CHOLECYSTECTOMY    . ESOPHAGOGASTRODUODENOSCOPY N/A 11/30/2012   Procedure: ESOPHAGOGASTRODUODENOSCOPY (EGD);  Surgeon: Wonda Horner, MD;  Location: [facility] ENDOSCOPY;  Service: Endoscopy;  Laterality: N/A;  . EYE SURGERY     cataract removal pt unsure which eye  . FOOT SURGERY Left   . JOINT REPLACEMENT Bilateral    knees  . NEPHRECTOMY  1963  . PATELLAR TENDON REPAIR Right 07/12/2013   Procedure: PRIMARY LEFT PATELLA TENDON REPAIR;  Surgeon: Kerin Salen, MD;  Location: [facility];  Service: Orthopedics;  Laterality: Right;  . SHOULDER SURGERY Right   . TONSILLECTOMY    . TOTAL KNEE REVISION Right 06/19/2013   Procedure: RIGHT TOTAL KNEE REVISION;  Surgeon: Kerin Salen, MD;  Location: [facility];  Service: Orthopedics;  Laterality: Right;  . TUBAL LIGATION      OB History    No data available       Home Medications    Prior to Admission medications   Medication Sig Start Date End Date Taking? Authorizing Provider  acetaminophen (TYLENOL) 500 MG tablet Take 500 mg by mouth every 4 (four) hours as needed for mild pain or headache (or fever of 99.5-101 F).    Yes [provider]  alum & mag hydroxide-simeth ([place]) 200-200-20 MG/5ML suspension Take 30 mLs by mouth every 6 (six) hours as needed for indigestion or heartburn.   Yes [provider]  amLODipine (NORVASC) 5 MG tablet Take 5 mg by mouth at bedtime.   Yes [provider]  ARIPiprazole (ABILIFY) 2 MG tablet Take 2 mg by mouth daily.   Yes [provider]    aspirin 81 MG chewable tablet Chew 81 mg by mouth daily.   Yes [provider]  buPROPion (WELLBUTRIN XL) 150 MG 24 hr tablet Take 150 mg by mouth every morning.   Yes [provider]  carboxymethylcellulose (REFRESH TEARS) 0.5 % SOLN Place 1 drop into both eyes 4 (four) times daily.   Yes [provider]  carvedilol (COREG) 12.5 MG tablet Take 12.5 mg by mouth 2 (two) times daily with a meal.   Yes [provider]  cycloSPORINE (RESTASIS) 0.05 % ophthalmic emulsion Place 1 drop into both eyes 2 (two) times daily.   Yes [provider]  diclofenac sodium (VOLTAREN) 1 % GEL Apply 2 g topically 4 (four) times daily. Rub into affected area of foot 2 to 4 times daily Patient taking differently: Apply 2 g topically 2 (two) times  daily. TO AFFECTED GREAT TOE 03/10/16  Yes Trula Slade, DPM  fentaNYL (DURAGESIC - DOSED MCG/HR) 50 MCG/HR Place 50 mcg onto the skin every 3 (three) days. REMOVE OLD [facility] FIRST   Yes [provider]  ferrous sulfate 325 (65 FE) MG tablet Take 325 mg by mouth every Monday, Wednesday, and Friday.    Yes [provider]  furosemide (LASIX) 20 MG tablet Take 1 tablet (20 mg total) by mouth daily. 01/23/14  Yes Troy Sine, MD  gabapentin (NEURONTIN) 300 MG capsule Take 1 capsule by mouth 2 (two) times daily. 05/05/16  Yes [provider]  guaifenesin (ROBITUSSIN) 100 MG/5ML syrup Take 200 mg by mouth every 6 (six) hours as needed for cough. NOT TO EXCEED 4 DOSES/24 HOURS   Yes [provider]  HYDROcodone-acetaminophen (NORCO/VICODIN) 5-325 MG tablet Take 1 tablet by mouth every evening. 06/18/16  Yes Kilroy, Luke K, PA-C  isosorbide mononitrate (IMDUR) 30 MG 24 hr tablet Take 30 mg by mouth daily. 05/23/16  Yes [provider]  latanoprost (XALATAN) 0.005 % ophthalmic solution Place 1 drop into both eyes at bedtime.   Yes [provider]  Lidocaine (ASPERCREME LIDOCAINE) 4 %  PTCH Apply 1 patch topically every evening. AND REMOVE FROM THE RIGHT KNEE EVERY MORNING   Yes [provider]  LORazepam (ATIVAN) 0.5 MG tablet Take 0.5-1 mg by mouth See admin instructions. 0.5 mg in the morning and 1 mg at bedtime   Yes [provider]  losartan (COZAAR) 25 MG tablet Take 1 tablet by mouth daily. 05/10/16  Yes [provider]  magnesium hydroxide (MILK OF MAGNESIA) 400 MG/5ML suspension Take 30 mLs by mouth at bedtime as needed for mild constipation.    Yes [provider]  Melatonin 3 MG TABS Take 3 mg by mouth at bedtime.   Yes [provider]  Multiple Vitamins-Minerals (CEROVITE ADVANCED FORMULA PO) Take 1 tablet by mouth daily.    Yes [provider]  neomycin-bacitracin-polymyxin (NEOSPORIN) ointment Apply 1 application topically as needed for wound care (OR SKIN TEARS OR ABRASIONS).    Yes [provider]  pantoprazole (PROTONIX) 40 MG tablet Take 40 mg by mouth daily.   Yes [provider]  polyethylene glycol (MIRALAX / GLYCOLAX) packet Take 17 g by mouth daily as needed for severe constipation. MIXED INTO 4-8 OUNCES OF LIQUID   Yes [provider]  vitamin B-12 (CYANOCOBALAMIN) 50 MCG tablet Take 50 mcg by mouth daily.   Yes [provider]  isosorbide mononitrate (IMDUR) 60 MG 24 hr tablet Take 1 tablet (60 mg total) by mouth daily. Patient not taking: Reported on 06/21/2016 06/19/16   Erlene Quan, PA-C  NONFORMULARY OR COMPOUNDED ITEM [facility]:  "Authorized Substitute" Pain Cream Formulation - Ibuprofen 15%, Gaclofen 1%, Gabapentin 3%, Lidocaine 2%, apply 2 grams to affected area 3 times daily. Patient not taking: Reported on 06/16/2016 03/16/16   Trula Slade, DPM    Family History Family History  Problem Relation Age of Onset  . Stomach cancer Mother   . Diabetes Brother   . Heart attack Brother     Social History Social History  Substance Use Topics  .  Smoking status: Never Smoker  . Smokeless tobacco: Current User    Types: Snuff  . Alcohol use No     Allergies   Codeine   Review of Systems Review of Systems  Constitutional: Negative for chills and fever.  Respiratory: Negative  for shortness of breath.   Cardiovascular: Positive for chest pain. Negative for palpitations and leg swelling.  Gastrointestinal: Negative for abdominal pain, diarrhea, nausea and vomiting.  Musculoskeletal: Negative for back pain.  Neurological: Positive for speech difficulty. Negative for facial asymmetry, weakness, numbness and headaches.  All other systems reviewed and are negative.    Physical Exam Updated Vital Signs BP 125/67   Pulse 65   Temp 97.4 F (36.3 C) (Oral)   Resp 14   SpO2 96%   Physical Exam  Constitutional: She appears well-developed and well-nourished. No distress.  HENT:  Head: Normocephalic and atraumatic.  Eyes: Conjunctivae are normal.  Neck: Neck supple.  Cardiovascular: Normal rate and regular rhythm.   No murmur heard. Pulmonary/Chest: Effort normal and breath sounds normal. No respiratory distress. She has no wheezes. She has no rhonchi.  Abdominal: Soft. There is no tenderness.  Musculoskeletal: She exhibits no edema.  Neurological: She is alert. She has normal strength. No cranial nerve deficit or sensory deficit. She displays a negative Romberg sign. Coordination normal.  Skin: Skin is warm and dry.  Psychiatric: She has a normal mood and affect.  Nursing note and vitals reviewed.    ED Treatments / Results  Labs (all labs ordered are listed, but only abnormal results are displayed) Labs Reviewed  BASIC METABOLIC PANEL - Abnormal; Notable for the following:       Result Value   BUN 37 (*)    Creatinine, Ser 1.71 (*)    GFR calc non Af Amer 27 (*)    GFR calc Af Amer 32 (*)    All other components within normal limits  CBC - Abnormal; Notable for the following:    RBC 3.45 (*)    Hemoglobin 10.7  (*)    HCT 33.2 (*)    All other components within normal limits  I-STAT TROPOININ, ED  I-STAT TROPOININ, ED    EKG  EKG Interpretation  Date/Time:  Tuesday Jun 21 2016 17:46:51 EDT Ventricular Rate:  61 PR Interval:    QRS Duration: 152 QT Interval:  475 QTC Calculation: 479 R Axis:   30 Text Interpretation:  Sinus rhythm Borderline prolonged PR interval Left bundle branch block No significant change since last tracing Confirmed by KNAPP  MD-J, JON ([id number]) on 06/21/2016 5:59:08 PM       Radiology Dg Chest 2 View  Result Date: 06/21/2016 CLINICAL DATA:  Mid chest pain beginning today with shortness of breath. Pain radiates to the left arm. EXAM: CHEST  2 VIEW COMPARISON:  06/16/2016 FINDINGS: Artifact overlies chest. Some motion degradation. Cardiomegaly. Aortic atherosclerosis and unfolding. Venous hypertension with mild interstitial edema and a small amount of fluid in the fissures. IMPRESSION: Congestive heart failure. Cardiomegaly. Venous hypertension. Mild interstitial edema and fluid in the fissures. Electronically Signed   By: Nelson Chimes M.D.   On: 06/21/2016 19:01   Ct Head Wo Contrast  Result Date: 06/21/2016 CLINICAL DATA:  Slurred speech.  Improved. EXAM: CT HEAD WITHOUT CONTRAST TECHNIQUE: Contiguous axial images were obtained from the base of the skull through the vertex without intravenous contrast. COMPARISON:  Head CT 05/26/2011 FINDINGS: Brain: Stable atrophy. Stable periventricular and deep white matter hypodensity, nonspecific but most commonly secondary to chronic small vessel ischemia, moderate to advanced in degree. No intracranial hemorrhage, evidence of acute infarct, hydrocephalus, mass effect or midline shift. No subdural or extra-axial fluid collection. Vascular: Atherosclerosis of skullbase vasculature without hyperdense vessel or abnormal calcification. Skull: Normal. Negative for fracture  or focal lesion. Sinuses/Orbits: Post left cataract resection. Paranasal  sinuses and mastoid air cells are well-aerated. Other: None. IMPRESSION: Stable atrophy and chronic small vessel ischemia. No CT findings of acute intracranial abnormality. Electronically Signed   By: Jeb Levering M.D.   On: 06/21/2016 19:31    Procedures Procedures (including critical care time)  Medications Ordered in ED Medications - No data to display   Initial Impression / Assessment and Plan / ED Course  I have reviewed the triage vital signs and the nursing notes.  Pertinent labs & imaging results that were available during my care of the patient were reviewed by me and considered in my medical decision making (see chart for details).     Patient coming in with a couple issues.  As far as the chest pain, pressure in nature, nonexertional. Feels like the last time she was here in the hospital. Per the report, there was evidence of ST depressions in the inferior leads on exertion on the nuclear med study, but the overall study was reassuring and low risk. Was deemed appropriate for discharge by cardiology. Here the patient has an EKG consistent with her prior with a left bundle branch block. No evidence of ischemia or new interval abnormalities. Troponin is negative. Doubt ACS. Chest x-ray without evidence of pneumothorax or pneumonia. Description of symptoms not consistent with aortic dissection or pulmonary embolus.  Patient was also complaining of slurred speech that occurred at the same time. She reports the slurred speech has largely resolved at this point. On chart review, note that the patient did present with symptoms concerning for possible TIA. She had an echocardiogram and carotid Dopplers did not show any acute pathology. Today She has no focal neurological deficits on my exam. CT without contrast of her head without acute intracranial pathology. Told the patient to follow up with her primary care doctor as soon as possible regarding this. Suggested she follow up with  outpatient neurology as well.  Of note, patient creatinine here is 1.7. Baseline appears to be approximately 1.3. Offered to give the patient IV fluids, but she reports she would prefer to try oral intake. Labs all showed show baseline stable anemia.  Final Clinical Impressions(s) / ED Diagnoses   Final diagnoses:  Slurred speech  Precordial pain    New Prescriptions Discharge Medication List as of 06/21/2016  9:23 PM       Maryan Puls, MD 06/22/16 [id number]    Dorie Rank, MD 06/22/16 [phone]

## 2016-06-30 ENCOUNTER — Ambulatory Visit: Payer: Medicare Other | Admitting: Physician Assistant

## 2016-07-05 ENCOUNTER — Encounter: Payer: Self-pay | Admitting: Physician Assistant

## 2016-07-05 ENCOUNTER — Ambulatory Visit (INDEPENDENT_AMBULATORY_CARE_PROVIDER_SITE_OTHER): Payer: Medicare Other | Admitting: Physician Assistant

## 2016-07-05 VITALS — BP 118/64 | HR 70 | Ht 64.0 in

## 2016-07-05 DIAGNOSIS — I5033 Acute on chronic diastolic (congestive) heart failure: Secondary | ICD-10-CM

## 2016-07-05 DIAGNOSIS — I10 Essential (primary) hypertension: Secondary | ICD-10-CM

## 2016-07-05 DIAGNOSIS — R079 Chest pain, unspecified: Secondary | ICD-10-CM

## 2016-07-05 LAB — BASIC METABOLIC PANEL
BUN / CREAT RATIO: 24 (ref 12–28)
BUN: 28 mg/dL — ABNORMAL HIGH (ref 8–27)
CO2: 23 mmol/L (ref 18–29)
Calcium: 9.8 mg/dL (ref 8.7–10.3)
Chloride: 108 mmol/L — ABNORMAL HIGH (ref 96–106)
Creatinine, Ser: 1.18 mg/dL — ABNORMAL HIGH (ref 0.57–1.00)
GFR calc Af Amer: 51 mL/min/{1.73_m2} — ABNORMAL LOW (ref >59)
GFR, EST NON AFRICAN AMERICAN: 44 mL/min/{1.73_m2} — AB (ref >59)
GLUCOSE: 95 mg/dL (ref 65–99)
POTASSIUM: 4.5 mmol/L (ref 3.5–5.2)
SODIUM: 146 mmol/L — AB (ref 134–144)

## 2016-07-05 MED ORDER — FUROSEMIDE 20 MG PO TABS: ORAL_TABLET | ORAL | 6 refills | Status: DC

## 2016-07-05 MED ORDER — POTASSIUM CHLORIDE CRYS ER 20 MEQ PO TBCR: 20.0000 meq | EXTENDED_RELEASE_TABLET | Freq: Every day | ORAL | 5 refills | Status: DC

## 2016-07-05 NOTE — Patient Instructions (Addendum)
Medication Instructions:  INCREASE YOUR FUROSEMIDE TO 40 MG (2 TABLETS) DAILY FOR 4 DAYS AND THEN DECREASE TO 1 TABLET (20 MG) DAILY   START POTASSIUM 20 MEQ DAILY   Labwork: BMET TODAY   Testing/Procedures: NONE  Follow-Up: Your physician recommends that you schedule a follow-up appointment in: 2 WEEKS WITH DR Claiborne Billings, RHONDA B PA, OR CHRIS B, NP   Any Other Special Instructions Will Be Listed Below (If Applicable).  Fluid Restriction Some health conditions may require you to restrict your fluid intake. This means that you need to limit the amount of fluid you drink each day. When you have a fluid restriction, you must carefully measure and keep track of the amount of fluid you drink. Your health care provider will identify the specific amount of fluid you are allowed each day. This amount may depend on several things, such as:  The amount of urine you produce in a day.  How much fluid you are keeping (retaining) in your body.  Your blood pressure. What is my plan? Your health care provider recommends that you limit your fluid intake to ____1.5_QUARTS, MOSTLY WATER___ per day. What counts toward my fluid intake? Your fluid intake includes all liquids that you drink, as well as any foods that become liquid at room temperature. The following are examples of some fluids that you will have to restrict:  Tea, coffee, soda, lemonade, milk, water, juice, sport drinks, and nutritional supplement beverages.  Alcoholic beverages.  Cream.  Gravy.  Ice cubes.  Soup and broth. The following are examples of foods that become liquid at room temperature. These foods will also count toward your fluid intake.  Ice cream and ice milk.  Frozen yogurt and sherbet.  Frozen ice pops.  Flavored gelatin. How do I keep track of my fluid intake? Each morning, fill a jug with the amount of water that equals the amount of fluid you are allowed for the day. You can use this water as a guideline  for fluid allowance. Each time you take in any form of fluid, including ice cubes and foods that become liquid at room temperature, pour an equal amount of water out of the container. This helps you to see how much fluid you are taking in. It also helps you to see how much of your fluid intake is left for the rest of the day. The following conversions may also be helpful in measuring your fluid intake:  1 cup equals 8 oz (240 mL).   cup equals 6 oz (180 mL).  ? cup equals 5? oz (160 mL).   cup equals 4 oz (120 mL).  ? cup equals 2? oz (80 mL).   cup equals 2 oz (60 mL).  2 Tbsp equals 1 oz (30 mL). What home care instructions should I follow while restricting fluids?  Make sure that you stay within the recommended limit each day. Always measure and keep track of your fluids, as well as any foods that turn liquid at room temperature.  Use small cups and glasses and learn to sip fluids slowly.  Add a slice of fresh lemon or lemon juice to water or ice. This helps to satisfy your thirst.  Freeze fruit juice or water in an ice cube tray. Use this as part of your fluid allowance. These cubes are useful for quenching your thirst. Measure the amount of liquid in each ice cube prior to freezing so you can subtract this amount from your day's allowance when you consume  each frozen cube.  Try frozen fruits between meals, such as grapes or strawberries.  Swallow your pills along with meals or soft foods, such as applesauce or mashed potatoes. This helps you to save your fluid allowance for something that you enjoy.  Weigh yourself every day. Keeping track of your daily weight can help you and your health care provider to notice as soon as possible if you are retaining too much fluid in your body.  Weigh yourself every morning after you urinate but before you eat breakfast.  Wear the same amount of clothing each time you weigh yourself.  Write down your daily weight. Give this weight  record to your health care provider. If your weight is going up, you may be retaining too much fluid. Every 2 cups (480 mL) of fluid retained in the body becomes an extra 1 lb (0.45 kg) of body weight.  Avoid salty foods. These foods make you thirsty and make fluid control more difficult.  Brush your teeth often or rinse your mouth with mouthwash to help your dry mouth. Lemon wedges, hard sour candies, chewing gum, or breath spray may also help to moisten your mouth.  Keep the temperature in your home at a cooler level. Dry air increases thirst, so keep the air in your home as humid as possible.  Avoid being out in the hot sun, which can cause you to sweat and become thirsty. What are some signs that I may be taking in too much fluid? You may be taking in too much fluid if:  Your weight increases. Contact your health care provider if your weight increases 3 lb or more in a day or if it increases 5 lb or more in a week.  Your face, hands, legs, feet, and belly (abdomen) start to swell.  You have trouble breathing. This information is not intended to replace advice given to you by your health care provider. Make sure you discuss any questions you have with your health care provider. Document Released: 11/28/2006 Document Revised: 07/09/2015 Document Reviewed: 07/02/2013 Elsevier Interactive Patient Education  2017 [place].     Low-Sodium Eating Plan Sodium, which is an element that makes up salt, helps you maintain a healthy balance of fluids in your body. Too much sodium can increase your blood pressure and cause fluid and waste to be held in your body. Your health care provider or dietitian may recommend following this plan if you have high blood pressure (hypertension), kidney disease, liver disease, or heart failure. Eating less sodium can help lower your blood pressure, reduce swelling, and protect your heart, liver, and kidneys. What are tips for following this plan? General  guidelines   Most people on this plan should limit their sodium intake to 1,500-2,000 mg (milligrams) of sodium each day. Reading food labels   The Nutrition Facts label lists the amount of sodium in one serving of the food. If you eat more than one serving, you must multiply the listed amount of sodium by the number of servings.  Choose foods with less than 140 mg of sodium per serving.  Avoid foods with 300 mg of sodium or more per serving. Shopping   Look for lower-sodium products, often labeled as "low-sodium" or "no salt added."  Always check the sodium content even if foods are labeled as "unsalted" or "no salt added".  Buy fresh foods.  Avoid canned foods and premade or frozen meals.  Avoid canned, cured, or processed meats  Buy breads that have  less than 80 mg of sodium per slice. Cooking   Eat more home-cooked food and less restaurant, buffet, and fast food.  Avoid adding salt when cooking. Use salt-free seasonings or herbs instead of table salt or sea salt. Check with your health care provider or pharmacist before using salt substitutes.  Cook with plant-based oils, such as canola, sunflower, or olive oil. Meal planning   When eating at a restaurant, ask that your food be prepared with less salt or no salt, if possible.  Avoid foods that contain MSG (monosodium glutamate). MSG is sometimes added to [place] food, bouillon, and some canned foods. What foods are recommended? The items listed may not be a complete list. Talk with your dietitian about what dietary choices are best for you. Grains  Low-sodium cereals, including oats, puffed wheat and rice, and shredded wheat. Low-sodium crackers. Unsalted rice. Unsalted pasta. Low-sodium bread. Whole-grain breads and whole-grain pasta. Vegetables  Fresh or frozen vegetables. "No salt added" canned vegetables. "No salt added" tomato sauce and paste. Low-sodium or reduced-sodium tomato and vegetable juice. Fruits  Fresh,  frozen, or canned fruit. Fruit juice. Meats and other protein foods  Fresh or frozen (no salt added) meat, poultry, seafood, and fish. Low-sodium canned tuna and salmon. Unsalted nuts. Dried peas, beans, and lentils without added salt. Unsalted canned beans. Eggs. Unsalted nut butters. Dairy  Milk. Soy milk. Cheese that is naturally low in sodium, such as ricotta cheese, fresh mozzarella, or Swiss cheese Low-sodium or reduced-sodium cheese. Cream cheese. Yogurt. Fats and oils  Unsalted butter. Unsalted margarine with no trans fat. Vegetable oils such as canola or olive oils. Seasonings and other foods  Fresh and dried herbs and spices. Salt-free seasonings. Low-sodium mustard and ketchup. Sodium-free salad dressing. Sodium-free light mayonnaise. Fresh or refrigerated horseradish. Lemon juice. Vinegar. Homemade, reduced-sodium, or low-sodium soups. Unsalted popcorn and pretzels. Low-salt or salt-free chips. What foods are not recommended? The items listed may not be a complete list. Talk with your dietitian about what dietary choices are best for you. Grains  Instant hot cereals. Bread stuffing, pancake, and biscuit mixes. Croutons. Seasoned rice or pasta mixes. Noodle soup cups. Boxed or frozen macaroni and cheese. Regular salted crackers. Self-rising flour. Vegetables  Sauerkraut, pickled vegetables, and relishes. Olives. [place] fries. Onion rings. Regular canned vegetables (not low-sodium or reduced-sodium). Regular canned tomato sauce and paste (not low-sodium or reduced-sodium). Regular tomato and vegetable juice (not low-sodium or reduced-sodium). Frozen vegetables in sauces. Meats and other protein foods  Meat or fish that is salted, canned, smoked, spiced, or pickled. Bacon, ham, sausage, hotdogs, corned beef, chipped beef, packaged lunch meats, salt pork, jerky, pickled herring, anchovies, regular canned tuna, sardines, salted nuts. Dairy  Processed cheese and cheese spreads. Cheese curds.  Blue cheese. Feta cheese. String cheese. Regular cottage cheese. Buttermilk. Canned milk. Fats and oils  Salted butter. Regular margarine. Ghee. Bacon fat. Seasonings and other foods  Onion salt, garlic salt, seasoned salt, table salt, and sea salt. Canned and packaged gravies. Worcestershire sauce. Tartar sauce. Barbecue sauce. Teriyaki sauce. Soy sauce, including reduced-sodium. Steak sauce. Fish sauce. Oyster sauce. Cocktail sauce. Horseradish that you find on the shelf. Regular ketchup and mustard. Meat flavorings and tenderizers. Bouillon cubes. Hot sauce and Tabasco sauce. Premade or packaged marinades. Premade or packaged taco seasonings. Relishes. Regular salad dressings. Salsa. Potato and tortilla chips. Corn chips and puffs. Salted popcorn and pretzels. Canned or dried soups. Pizza. Frozen entrees and pot pies. Summary  Eating less sodium can help  lower your blood pressure, reduce swelling, and protect your heart, liver, and kidneys.  Most people on this plan should limit their sodium intake to 1,500-2,000 mg (milligrams) of sodium each day.  Canned, boxed, and frozen foods are high in sodium. Restaurant foods, fast foods, and pizza are also very high in sodium. You also get sodium by adding salt to food.  Try to cook at home, eat more fresh fruits and vegetables, and eat less fast food, canned, processed, or prepared foods. This information is not intended to replace advice given to you by your health care provider. Make sure you discuss any questions you have with your health care provider. Document Released: 07/23/2001 Document Revised: 01/25/2016 Document Reviewed: 01/25/2016 Elsevier Interactive Patient Education  2017 [employer].

## 2016-07-05 NOTE — Progress Notes (Signed)
Cardiology Office Note   Date:  07/05/2016   ID:  OMA MARZAN, DOB Dec 16, 1935, MRN [id number]  PCP:  Sande Brothers, MD  Cardiologist:  Dr Claiborne Billings 01/25/2014 Rosaria Ferries, PA-C   Chief Complaint  Patient presents with  . ED f/u visit    pt states she is no longer having chest pain; c/o SOB at bed time, some swelling in lower extremities    History of Present Illness: Jennifer [name] is a 81 y.o. female with a history of NICM w/ EF 35% 2006>>nl by 2011 echo, nl cors at cath 2006, nl MV 2015, LBBB, HTN, HH, atypical CP, osteoporosis, anxiety, dementia, GERD  Admit 05/03>>06/18/2016 for CP>>MV nl, carotids w/ <40% stenosis bilaterally ER visit 05/08 for CP, no acute findings and sx improved.  Duayne Cal Billard presents for cardiology folllow up.  Her chest pain has resolved. She is not able to walk, her R knee is worse than her L. Problems with her R TKR Have been significant and are the main reason she cannot walk now.  She denies DOE (transfers are the only thing she does). She describes orthopnea but does not think she gets PND. Her last weight was at the facility last week, 212 lbs. She has LE edema, wakes with it and it gets worse during the day. She drinks 3-6 ounce Cans of juice daily and drinks part of a 20 ounce soda daily. She drinks some water but not very much. She is compliant with her medications.  She also has MS problems with her L shoulder. She cannot lift her L arm.    Past Medical History:  Diagnosis Date  . Anemia   . Anxiety   . Arthritis   . Atypical chest pain    a. 2006 Cath: nl cors;  b. 05/2010 MV: no ischemia;  c. 05/2013 MV: nl EF, no ischemia.  . Blood transfusion   . Cardiomyopathy, nonischemic ([place])    a. 2006 EF initially 30%;  b. 10/2009 Echo EF now 50-55%; c. 11/2012 Echo: EF 45-50% w/ septal motion abnormality (LBBB), gr1 DD, mod TR, PASP 80mmHg; d. 05/2013 MV: Nl EF by SPECT.  . Constipation due to pain medication   . Dementia   .  Depression   . Diarrhea   . Frequency of urination    at night  . GERD (gastroesophageal reflux disease)   . Glaucoma   . Hypertension   . LBBB (left bundle branch block)   . Osteoporosis 04/13/2006  . Paget's disease of bone 11/29/2012  . Psychosis   . Renal disorder    only has one kidney; Right kidney stopped working after last child was born  . Seizures ([place])    approximately 20 years since last seizure  . Swelling of both ankles    Takes Lasix if needed  . Thyroid disease 04/13/2006   hypothyroidism  . TIA (transient ischemic attack)     Past Surgical History:  Procedure Laterality Date  . BREAST LUMPECTOMY Left    x 2  . CARDIAC CATHETERIZATION    . CHOLECYSTECTOMY    . ESOPHAGOGASTRODUODENOSCOPY N/A 11/30/2012   Procedure: ESOPHAGOGASTRODUODENOSCOPY (EGD);  Surgeon: Wonda Horner, MD;  Location: [facility] ENDOSCOPY;  Service: Endoscopy;  Laterality: N/A;  . EYE SURGERY     cataract removal pt unsure which eye  . FOOT SURGERY Left   . JOINT REPLACEMENT Bilateral    knees  . NEPHRECTOMY  1963  . PATELLAR TENDON REPAIR Right 07/12/2013  Procedure: PRIMARY LEFT PATELLA TENDON REPAIR;  Surgeon: Kerin Salen, MD;  Location: [place];  Service: Orthopedics;  Laterality: Right;  . SHOULDER SURGERY Right   . TONSILLECTOMY    . TOTAL KNEE REVISION Right 06/19/2013   Procedure: RIGHT TOTAL KNEE REVISION;  Surgeon: Kerin Salen, MD;  Location: [place];  Service: Orthopedics;  Laterality: Right;  . TUBAL LIGATION      Current Outpatient Prescriptions  Medication Sig Dispense Refill  . acetaminophen (TYLENOL) 500 MG tablet Take 500 mg by mouth every 4 (four) hours as needed for mild pain or headache (or fever of 99.5-101 F).     [name] alum & mag hydroxide-simeth (MINTOX) 253-664-40 MG/5ML suspension Take 30 mLs by mouth every 6 (six) hours as needed for indigestion or heartburn.    [name] amLODipine (NORVASC) 5 MG tablet Take 5 mg by mouth at bedtime.    . ARIPiprazole (ABILIFY) 2 MG tablet  Take 2 mg by mouth daily.    [name] aspirin 81 MG chewable tablet Chew 81 mg by mouth daily.    [name] buPROPion (WELLBUTRIN XL) 150 MG 24 hr tablet Take 150 mg by mouth every morning.    . carboxymethylcellulose (REFRESH TEARS) 0.5 % SOLN Place 1 drop into both eyes 4 (four) times daily.    . carvedilol (COREG) 12.5 MG tablet Take 12.5 mg by mouth 2 (two) times daily with a meal.    . cycloSPORINE (RESTASIS) 0.05 % ophthalmic emulsion Place 1 drop into both eyes 2 (two) times daily.    . diclofenac sodium (VOLTAREN) 1 % GEL Apply 2 g topically 4 (four) times daily. Rub into affected area of foot 2 to 4 times daily (Patient taking differently: Apply 2 g topically 2 (two) times daily. TO AFFECTED GREAT TOE) 100 g 2  . fentaNYL (DURAGESIC - DOSED MCG/HR) 50 MCG/HR Place 50 mcg onto the skin every 3 (three) days. REMOVE OLD PATCH FIRST    . ferrous sulfate 325 (65 FE) MG tablet Take 325 mg by mouth every Monday, Wednesday, and Friday.     . furosemide (LASIX) 20 MG tablet Take 1 tablet (20 mg total) by mouth daily. 30 tablet 6  . gabapentin (NEURONTIN) 300 MG capsule Take 1 capsule by mouth 2 (two) times daily.    [name] guaifenesin (ROBITUSSIN) 100 MG/5ML syrup Take 200 mg by mouth every 6 (six) hours as needed for cough. NOT TO EXCEED 4 DOSES/24 HOURS    . HYDROcodone-acetaminophen (NORCO/VICODIN) 5-325 MG tablet Take 1 tablet by mouth every evening.    . isosorbide mononitrate (IMDUR) 30 MG 24 hr tablet Take 30 mg by mouth daily.    . isosorbide mononitrate (IMDUR) 60 MG 24 hr tablet Take 1 tablet (60 mg total) by mouth daily. 60 tablet 11  . latanoprost (XALATAN) 0.005 % ophthalmic solution Place 1 drop into both eyes at bedtime.    . Lidocaine (ASPERCREME LIDOCAINE) 4 % PTCH Apply 1 patch topically every evening. AND REMOVE FROM THE RIGHT KNEE EVERY MORNING    . LORazepam (ATIVAN) 0.5 MG tablet Take 0.5-1 mg by mouth See admin instructions. 0.5 mg in the morning and 1 mg at bedtime    . losartan (COZAAR) 25 MG  tablet Take 1 tablet by mouth daily.    . magnesium hydroxide (MILK OF MAGNESIA) 400 MG/5ML suspension Take 30 mLs by mouth at bedtime as needed for mild constipation.     . Melatonin 3 MG TABS Take 3 mg by mouth at  bedtime.    . Multiple Vitamins-Minerals (CEROVITE ADVANCED FORMULA PO) Take 1 tablet by mouth daily.     [name] neomycin-bacitracin-polymyxin (NEOSPORIN) ointment Apply 1 application topically as needed for wound care (OR SKIN TEARS OR ABRASIONS).     . NONFORMULARY OR COMPOUNDED ITEM [facility]:  "Authorized Substitute" Pain Cream Formulation - Ibuprofen 15%, Gaclofen 1%, Gabapentin 3%, Lidocaine 2%, apply 2 grams to affected area 3 times daily. 120 each 2  . pantoprazole (PROTONIX) 40 MG tablet Take 40 mg by mouth daily.    . polyethylene glycol (MIRALAX / GLYCOLAX) packet Take 17 g by mouth daily as needed for severe constipation. MIXED INTO 4-8 OUNCES OF LIQUID    . vitamin B-12 (CYANOCOBALAMIN) 50 MCG tablet Take 50 mcg by mouth daily.     No current facility-administered medications for this visit.     Allergies:   Codeine    Social History:  The patient  reports that she has never smoked. Her smokeless tobacco use includes Snuff. She reports that she does not drink alcohol or use drugs.   Family History:  The patient's family history includes Diabetes in her brother; Heart attack in her brother; Stomach cancer in her mother.    ROS:  Please see the history of present illness. All other systems are reviewed and negative.    PHYSICAL EXAM: VS:  BP 118/64 (BP Location: Left Arm, Patient Position: Sitting, Cuff Size: Large)   Pulse 70   Ht 5\' 4"  (1.626 m)  , BMI There is no height or weight on file to calculate BMI. GEN: Well nourished, well developed, female in no acute distress  HEENT: normal for age  Neck: JVD 10 cm, ?R carotid bruit, no masses Cardiac: RRR; no murmur, no rubs, or gallops Respiratory: Few rales bases bilaterally, normal work of breathing GI:  soft, nontender, nondistended, + BS MS: no deformity or atrophy; one-2+ edema; compression stockings are in place, distal pulses are 2+ in all 4 extremities   Skin: warm and dry, no rash Neuro:  Strength and sensation are intact Psych: euthymic mood, full affect   EKG:  EKG is not ordered today.  Nm Myocar Multi W/spect W/wall Motion / Ef Result Date: 06/17/2016  ST segment depression was noted during stress in the I, II and aVF leads, beginning at 3 minutes of stress, ending at 3 minutes of stress, and returning to baseline after 5-9 minutes of recovery.  T wave inversion was noted during stress. T wave inversion persisted.  The study is normal.  This is a low risk study.  The left ventricular ejection fraction is normal (55-65%).  Normal pharmacologic nuclear stress test with no evidence of prior infarct or ischemia.    Recent Labs: 06/16/2016: Magnesium 2.1; TSH 2.958 06/18/2016: ALT 13 06/21/2016: BUN 37; Creatinine, Ser 1.71; Hemoglobin 10.7; Platelets 211; Potassium 4.1; Sodium 138    Lipid Panel    Component Value Date/Time   CHOL 145 06/17/2016 0117   TRIG 52 06/17/2016 0117   HDL 49 06/17/2016 0117   CHOLHDL 3.0 06/17/2016 0117   VLDL 10 06/17/2016 0117   LDLCALC 86 06/17/2016 0117     Wt Readings from Last 3 Encounters:  06/18/16 210 lb 4.8 oz (95.4 kg)  05/26/16 204 lb (92.5 kg)  01/23/14 180 lb (81.6 kg)     Other studies Reviewed: Additional studies/ records that were reviewed today include: Office notes, hospital records and testing.  ASSESSMENT AND PLAN:  1.  Chest pain: A recent  Myoview was normal, she has no history of CAD, and she has no history of exertional symptoms. She has significant problems with her left shoulder. I suggested to her that her chest pain may be not related to her heart and may be related to her shoulder. It is okay to try Tylenol if she gets the pain again.  2. Acute on chronic diastolic CHF: Her weight has been trending up and she has  volume overload by exam. She has been on a very low dose of Lasix with no potassium. We will double her Lasix for 4 days and see if this helps get rid of her fluid. We will add potassium. We will check a BMET today. She needs to come back in a couple of weeks. She needs to drink approximately 1.5 L of liquid a day and that needs to be mostly water. She needs to be on a 2000 mg sodium diet. We will check with the facility next week to see what her weight have been doing and she may need to be on Lasix 40 mg daily permanently.  3. Hypertension: Her blood pressure is under good control today.   Current medicines are reviewed at length with the patient today.  The patient does not have concerns regarding medicines.  The following changes have been made:  Increase Lasix, add Kdur  Labs/ tests ordered today include:   Orders Placed This Encounter  Procedures  . Basic metabolic panel     Disposition:   FU with Dr Claiborne Billings or PA/NP in 1-2 weeks  Signed, Lenoard Aden  07/05/2016 11:43 AM    [place] Phone: [phone]; Fax: [phone]  This note was written with the assistance of speech recognition software. Please excuse any transcriptional errors.

## 2016-07-19 ENCOUNTER — Ambulatory Visit (INDEPENDENT_AMBULATORY_CARE_PROVIDER_SITE_OTHER): Payer: Medicare Other | Admitting: Physician Assistant

## 2016-07-19 ENCOUNTER — Encounter: Payer: Self-pay | Admitting: Physician Assistant

## 2016-07-19 VITALS — BP 116/68 | HR 65 | Ht 64.0 in | Wt 212.0 lb

## 2016-07-19 DIAGNOSIS — R0600 Dyspnea, unspecified: Secondary | ICD-10-CM

## 2016-07-19 DIAGNOSIS — Z79899 Other long term (current) drug therapy: Secondary | ICD-10-CM | POA: Diagnosis not present

## 2016-07-19 DIAGNOSIS — I5032 Chronic diastolic (congestive) heart failure: Secondary | ICD-10-CM

## 2016-07-19 DIAGNOSIS — I10 Essential (primary) hypertension: Secondary | ICD-10-CM

## 2016-07-19 LAB — BASIC METABOLIC PANEL
BUN / CREAT RATIO: 25 (ref 12–28)
BUN: 30 mg/dL — AB (ref 8–27)
CO2: 24 mmol/L (ref 18–29)
CREATININE: 1.18 mg/dL — AB (ref 0.57–1.00)
Calcium: 9.9 mg/dL (ref 8.7–10.3)
Chloride: 106 mmol/L (ref 96–106)
GFR, EST AFRICAN AMERICAN: 51 mL/min/{1.73_m2} — AB (ref >59)
GFR, EST NON AFRICAN AMERICAN: 44 mL/min/{1.73_m2} — AB (ref >59)
Glucose: 85 mg/dL (ref 65–99)
Potassium: 5.1 mmol/L (ref 3.5–5.2)
SODIUM: 142 mmol/L (ref 134–144)

## 2016-07-19 MED ORDER — FUROSEMIDE 40 MG PO TABS: 40.0000 mg | ORAL_TABLET | Freq: Every day | ORAL | 3 refills | Status: DC

## 2016-07-19 NOTE — Progress Notes (Addendum)
Cardiology Office Note    Date:  07/20/2016   ID:  Jennifer [name], DOB 19-May-1935, MRN [id number]  PCP:  Sande Brothers, MD  Cardiologist:  Dr. Claiborne Billings  Chief Complaint  Patient presents with  . Follow-up    seen for Dr. Claiborne Billings, PND episodes    History of Present Illness:  EVVA DIN is a 81 y.o. female with PMH of NICM w/ improved EF (EF 35% 2006, improved to normal by 2011), normal cors by cath 2006, LBBB, HTN, dementia and anxiety. She was recently admitted with chest pain in May 2018. Carotid ultrasound showed only mild stenosis bilaterally. Myoview obtained on 06/25/2016 showed EF 55-65%, ST segment depression noted in lead 1, 2, aVF leads resolved with recovery, the perfusion image was normal without infarction or ischemia, therefore overall study was considered low risk. She presented to the ED on 5/80/2018 with slurring of speech. She had no focal deficit on physical exam. CT of the head without contrast showed no acute intracranial etiology. She was advised with outpatient follow-up. She was seen by Rosaria Ferries PA-C on 07/05/2016 for follow-up, she was noted to have acute on chronic diastolic heart failure on physical exam. Her Lasix was increased to 40 mg daily for 4 days before going back to the previous dose. She presents today for follow-up.  She is accompanied by her caregiver. She says she did not notice significant urinary output after using increasing the Lasix to 40 mg daily. However she continued to have significant PND episodes at night. Surprisingly, her physical exam does not suggest significant volume overload. She does have lower extremity edema, most of which are non-pitting edema instead. However her symptom is quite concerning for volume overload. I will permanently increase her Lasix to 40 mg daily as a trial. She is aware that she should not be taking Lasix after 6 PM to avoid interference with her sleep. She will return in a week for reassessment and also repeat  basic metabolic panel. I will also obtain a basic metabolic panel today to establish a baseline.    Past Medical History:  Diagnosis Date  . Anemia   . Anxiety   . Arthritis   . Atypical chest pain    a. 2006 Cath: nl cors;  b. 05/2010 MV: no ischemia;  c. 05/2013 MV: nl EF, no ischemia.  . Blood transfusion   . Cardiomyopathy, nonischemic ([place])    a. 2006 EF initially 30%;  b. 10/2009 Echo EF now 50-55%; c. 11/2012 Echo: EF 45-50% w/ septal motion abnormality (LBBB), gr1 DD, mod TR, PASP 1mmHg; d. 05/2013 MV: Nl EF by SPECT.  . Constipation due to pain medication   . Dementia   . Depression   . Diarrhea   . Frequency of urination    at night  . GERD (gastroesophageal reflux disease)   . Glaucoma   . Hypertension   . LBBB (left bundle branch block)   . Osteoporosis 04/13/2006  . Paget's disease of bone 11/29/2012  . Psychosis   . Renal disorder    only has one kidney; Right kidney stopped working after last child was born  . Seizures ([place])    approximately 20 years since last seizure  . Swelling of both ankles    Takes Lasix if needed  . Thyroid disease 04/13/2006   hypothyroidism  . TIA (transient ischemic attack)     Past Surgical History:  Procedure Laterality Date  . BREAST LUMPECTOMY Left    x  2  . CARDIAC CATHETERIZATION    . CHOLECYSTECTOMY    . ESOPHAGOGASTRODUODENOSCOPY N/A 11/30/2012   Procedure: ESOPHAGOGASTRODUODENOSCOPY (EGD);  Surgeon: Wonda Horner, MD;  Location: [facility] ENDOSCOPY;  Service: Endoscopy;  Laterality: N/A;  . EYE SURGERY     cataract removal pt unsure which eye  . FOOT SURGERY Left   . JOINT REPLACEMENT Bilateral    knees  . NEPHRECTOMY  1963  . PATELLAR TENDON REPAIR Right 07/12/2013   Procedure: PRIMARY LEFT PATELLA TENDON REPAIR;  Surgeon: Kerin Salen, MD;  Location: [place];  Service: Orthopedics;  Laterality: Right;  . SHOULDER SURGERY Right   . TONSILLECTOMY    . TOTAL KNEE REVISION Right 06/19/2013   Procedure: RIGHT TOTAL KNEE  REVISION;  Surgeon: Kerin Salen, MD;  Location: [place];  Service: Orthopedics;  Laterality: Right;  . TUBAL LIGATION      Current Medications: Outpatient Medications Prior to Visit  Medication Sig Dispense Refill  . acetaminophen (TYLENOL) 500 MG tablet Take 500 mg by mouth every 4 (four) hours as needed for mild pain or headache (or fever of 99.5-101 F).     [name] alum & mag hydroxide-simeth (MINTOX) 973-532-99 MG/5ML suspension Take 30 mLs by mouth every 6 (six) hours as needed for indigestion or heartburn.    [name] amLODipine (NORVASC) 5 MG tablet Take 5 mg by mouth at bedtime.    . ARIPiprazole (ABILIFY) 2 MG tablet Take 2 mg by mouth daily.    [name] aspirin 81 MG chewable tablet Chew 81 mg by mouth daily.    [name] buPROPion (WELLBUTRIN XL) 150 MG 24 hr tablet Take 150 mg by mouth every morning.    . carboxymethylcellulose (REFRESH TEARS) 0.5 % SOLN Place 1 drop into both eyes 4 (four) times daily.    . carvedilol (COREG) 12.5 MG tablet Take 12.5 mg by mouth 2 (two) times daily with a meal.    . cycloSPORINE (RESTASIS) 0.05 % ophthalmic emulsion Place 1 drop into both eyes 2 (two) times daily.    . diclofenac sodium (VOLTAREN) 1 % GEL Apply 2 g topically 4 (four) times daily. Rub into affected area of foot 2 to 4 times daily (Patient taking differently: Apply 2 g topically 2 (two) times daily. TO AFFECTED GREAT TOE) 100 g 2  . fentaNYL (DURAGESIC - DOSED MCG/HR) 50 MCG/HR Place 50 mcg onto the skin every 3 (three) days. REMOVE OLD PATCH FIRST    . ferrous sulfate 325 (65 FE) MG tablet Take 325 mg by mouth every Monday, Wednesday, and Friday.     . gabapentin (NEURONTIN) 300 MG capsule Take 1 capsule by mouth 2 (two) times daily.    [name] guaifenesin (ROBITUSSIN) 100 MG/5ML syrup Take 200 mg by mouth every 6 (six) hours as needed for cough. NOT TO EXCEED 4 DOSES/24 HOURS    . HYDROcodone-acetaminophen (NORCO/VICODIN) 5-325 MG tablet Take 1 tablet by mouth every evening.    . isosorbide mononitrate (IMDUR) 30  MG 24 hr tablet Take 30 mg by mouth daily.    . isosorbide mononitrate (IMDUR) 60 MG 24 hr tablet Take 1 tablet (60 mg total) by mouth daily. 60 tablet 11  . latanoprost (XALATAN) 0.005 % ophthalmic solution Place 1 drop into both eyes at bedtime.    . Lidocaine (ASPERCREME LIDOCAINE) 4 % PTCH Apply 1 patch topically every evening. AND REMOVE FROM THE RIGHT KNEE EVERY MORNING    . LORazepam (ATIVAN) 0.5 MG tablet Take 0.5-1 mg by mouth See  admin instructions. 0.5 mg in the morning and 1 mg at bedtime    . losartan (COZAAR) 25 MG tablet Take 1 tablet by mouth daily.    . magnesium hydroxide (MILK OF MAGNESIA) 400 MG/5ML suspension Take 30 mLs by mouth at bedtime as needed for mild constipation.     . Melatonin 3 MG TABS Take 3 mg by mouth at bedtime.    . Multiple Vitamins-Minerals (CEROVITE ADVANCED FORMULA PO) Take 1 tablet by mouth daily.     [name] neomycin-bacitracin-polymyxin (NEOSPORIN) ointment Apply 1 application topically as needed for wound care (OR SKIN TEARS OR ABRASIONS).     . NONFORMULARY OR COMPOUNDED ITEM [facility]:  "Authorized Substitute" Pain Cream Formulation - Ibuprofen 15%, Gaclofen 1%, Gabapentin 3%, Lidocaine 2%, apply 2 grams to affected area 3 times daily. 120 each 2  . pantoprazole (PROTONIX) 40 MG tablet Take 40 mg by mouth daily.    . polyethylene glycol (MIRALAX / GLYCOLAX) packet Take 17 g by mouth daily as needed for severe constipation. MIXED INTO 4-8 OUNCES OF LIQUID    . potassium chloride SA (K-DUR,KLOR-CON) 20 MEQ tablet Take 1 tablet (20 mEq total) by mouth daily. 30 tablet 5  . vitamin B-12 (CYANOCOBALAMIN) 50 MCG tablet Take 50 mcg by mouth daily.    . furosemide (LASIX) 20 MG tablet Take 2 tablets by mouth daily for 4 days and then decrease to 1 daily 30 tablet 6   No facility-administered medications prior to visit.      Allergies:   Codeine   Social History   Social History  . Marital status: Divorced    Spouse name: N/A  . Number of children:  N/A  . Years of education: N/A   Social History Main Topics  . Smoking status: Never Smoker  . Smokeless tobacco: Current User    Types: Snuff  . Alcohol use No  . Drug use: No  . Sexual activity: Not Asked   Other Topics Concern  . None   Social History Narrative  . None     Family History:  The patient's family history includes Diabetes in her brother; Heart attack in her brother; Stomach cancer in her mother.   ROS:   Please see the history of present illness.    ROS All other systems reviewed and are negative.   PHYSICAL EXAM:   VS:  BP 116/68   Pulse 65   Ht 5\' 4"  (1.626 m)   Wt 212 lb (96.2 kg) Comment: WEIGHED 212 YESTERDAY AT NURSING HOME  SpO2 94%   BMI 36.39 kg/m    GEN: Well nourished, well developed, in no acute distress  HEENT: normal  Neck: no JVD, carotid bruits, or masses Cardiac: RRR; no murmurs, rubs, or gallops,no edema  Respiratory:  clear to auscultation bilaterally, normal work of breathing GI: soft, nontender, nondistended, + BS MS: no deformity or atrophy  Skin: warm and dry, no rash Neuro:  Alert and Oriented x 3, Strength and sensation are intact Psych: euthymic mood, full affect  Wt Readings from Last 3 Encounters:  07/19/16 212 lb (96.2 kg)  06/18/16 210 lb 4.8 oz (95.4 kg)  05/26/16 204 lb (92.5 kg)      Studies/Labs Reviewed:   EKG:  EKG is not ordered today.    Recent Labs: 06/16/2016: Magnesium 2.1; TSH 2.958 06/18/2016: ALT 13 06/21/2016: Hemoglobin 10.7; Platelets 211 07/19/2016: BUN 30; Creatinine, Ser 1.18; Potassium 5.1; Sodium 142   Lipid Panel    Component Value  Date/Time   CHOL 145 06/17/2016 0117   TRIG 52 06/17/2016 0117   HDL 49 06/17/2016 0117   CHOLHDL 3.0 06/17/2016 0117   VLDL 10 06/17/2016 0117   LDLCALC 86 06/17/2016 0117    Additional studies/ records that were reviewed today include:   Cath 2006     Myoview 06/17/2016  ST segment depression was noted during stress in the I, II and aVF leads,  beginning at 3 minutes of stress, ending at 3 minutes of stress, and returning to baseline after 5-9 minutes of recovery.  T wave inversion was noted during stress. T wave inversion persisted.  The study is normal.  This is a low risk study.  The left ventricular ejection fraction is normal (55-65%).   Normal pharmacologic nuclear stress test with no evidence of prior infarct or ischemia.    ASSESSMENT:    1. Paroxysmal nocturnal dyspnea   2. Medication management   3. Essential hypertension   4. Chronic diastolic heart failure (HCC)      PLAN:  In order of problems listed above:  1. Paroxysmal nocturnal dyspnea  - She continued to have lower extremity edema, although mostly nonpitting in nature her lung is clear on physical exam. Surprisingly she continued to have PND episodes. I recommended a trial of 40 mg daily of Lasix.  2. Chronic diastolic heart failure: History of NICM, normal coronaries by cath in 2006. Normal Myoview in May 2018 with ejection fraction 55-65%. She does not appear to be significantly volume overloaded on physical exam. Although her PND episode is concerning for volume overload. May consider sleep study later to rule out obstructive sleep apnea as well. Obtain basic metabolic panel today and also in one week.  3. Hypertension: Blood pressure normal 116/68.   Medication Adjustments/Labs and Tests Ordered: Current medicines are reviewed at length with the patient today.  Concerns regarding medicines are outlined above.  Medication changes, Labs and Tests ordered today are listed in the Patient Instructions below. Patient Instructions  Medication Instructions: Please increase your Furosemide to 40 mg tablet daily.  Labwork: Please have a BMET drawn today and then again in [place].   Follow-Up: Your physician recommends that you schedule a follow-up appointment in: one week with Almyra Deforest, PA or Rosaria Ferries, PA   Any Additional Special  Instructions Will Be Listed Below (If Applicable).  Please weigh your self daily and keep a log of these weights. Please bring this with you to your next appointment.   If you need a refill on your cardiac medications before your next appointment, please call your pharmacy.      Hilbert Corrigan, [place]  07/20/2016 2:03 PM    [facility] [address], [place], [place]  [address] Phone: [phone]; Fax: [phone]

## 2016-07-19 NOTE — Patient Instructions (Signed)
Medication Instructions: Please increase your Furosemide to 40 mg tablet daily.  Labwork: Please have a BMET drawn today and then again in [place].   Follow-Up: Your physician recommends that you schedule a follow-up appointment in: one week with Almyra Deforest, PA or Rosaria Ferries, PA   Any Additional Special Instructions Will Be Listed Below (If Applicable).  Please weigh your self daily and keep a log of these weights. Please bring this with you to your next appointment.   If you need a refill on your cardiac medications before your next appointment, please call your pharmacy.

## 2016-07-20 ENCOUNTER — Encounter: Payer: Self-pay | Admitting: Physician Assistant

## 2016-07-20 NOTE — Progress Notes (Signed)
Kidney function and electrolyte stable on lasix.

## 2016-07-24 NOTE — Progress Notes (Signed)
Cardiology Office Note    Date:  07/26/2016   ID:  Jennifer [name], DOB [birth year], MRN [id number]  PCP:  Sande Brothers, MD  Cardiologist: Dr. Claiborne Billings   Chief Complaint  Patient presents with  . Shortness of Breath    pt states some SOB   . Edema    states both ankles     History of Present Illness:    Jennifer [name] is a 81 y.o. female with past medical history of nonischemic cardiomyopathy (EF 35% in 2006, improved to 45-50% in 2014, normal cors by cath in 2006 with NST in 06/2016 showing no ischemia or prior infarct, HTN, and dementia who presents to the office today for 1-week follow-up.   She was recently examined by Almyra Deforest, PA-C on 07/19/2016 and reported significant PND episodes despite not appearing volume overloaded by physical examination. Due to her concerning symptoms, Lasix was increased from 20mg  daily to 40mg  daily. A BMET was obtained and showed stable kidney function with a creatinine of 1.18.   In talking with the patient today, she notes continual episodes of orthopnea and PND occurring since her last visit. Reports these have slightly improved but have not resolved since being placed on an increased dose of Lasix. She notices this mostly when lying in bed at night and having difficulty breathing. She is wheel-chair bound and therefore unable to elaborate on any potential dyspnea on exertion. Lower extremity edema has improved with her diuretic dose adjustment along with utilizing compression stockings. She denies consuming any sodium. Weight is down 3 lbs since her last visit (212 lbs --> 209 lbs).   She denies any recent chest pain, palpitations, lightheadedness, dizziness, or presyncope. She is accompanied by her Caregiver today [name]) who reports her vitals have been stable with checked at her ALF.   Past Medical History:  Diagnosis Date  . Anemia   . Anxiety   . Arthritis   . Atypical chest pain    a. 2006 Cath: nl cors;  b. 05/2010 MV: no ischemia;   c. 05/2013 MV: nl EF, no ischemia. d. 06/2016: NST showing no ischemia or prior infarction  . Blood transfusion   . Cardiomyopathy, nonischemic ([place])    a. 2006 EF initially 30%;  b. 10/2009 Echo EF now 50-55%; c. 11/2012 Echo: EF 45-50% w/ septal motion abnormality (LBBB), gr1 DD, mod TR, PASP 63mmHg; d. 05/2013 MV: Nl EF by SPECT.  . Constipation due to pain medication   . Dementia   . Depression   . Diarrhea   . Frequency of urination    at night  . GERD (gastroesophageal reflux disease)   . Glaucoma   . Hypertension   . LBBB (left bundle branch block)   . Osteoporosis 04/13/2006  . Paget's disease of bone 11/29/2012  . Psychosis   . Renal disorder    only has one kidney; Right kidney stopped working after last child was born  . Seizures ([place])    approximately 20 years since last seizure  . Swelling of both ankles    Takes Lasix if needed  . Thyroid disease 04/13/2006   hypothyroidism  . TIA (transient ischemic attack)     Past Surgical History:  Procedure Laterality Date  . BREAST LUMPECTOMY Left    x 2  . CARDIAC CATHETERIZATION    . CHOLECYSTECTOMY    . ESOPHAGOGASTRODUODENOSCOPY N/A 11/30/2012   Procedure: ESOPHAGOGASTRODUODENOSCOPY (EGD);  Surgeon: Wonda Horner, MD;  Location: [facility];  Service:  Endoscopy;  Laterality: N/A;  . EYE SURGERY     cataract removal pt unsure which eye  . FOOT SURGERY Left   . JOINT REPLACEMENT Bilateral    knees  . NEPHRECTOMY  1963  . PATELLAR TENDON REPAIR Right 07/12/2013   Procedure: PRIMARY LEFT PATELLA TENDON REPAIR;  Surgeon: Kerin Salen, MD;  Location: [facility];  Service: Orthopedics;  Laterality: Right;  . SHOULDER SURGERY Right   . TONSILLECTOMY    . TOTAL KNEE REVISION Right 06/19/2013   Procedure: RIGHT TOTAL KNEE REVISION;  Surgeon: Kerin Salen, MD;  Location: [facility];  Service: Orthopedics;  Laterality: Right;  . TUBAL LIGATION      Current Medications: Outpatient Medications Prior to Visit  Medication Sig Dispense  Refill  . acetaminophen (TYLENOL) 500 MG tablet Take 500 mg by mouth every 4 (four) hours as needed for mild pain or headache (or fever of 99.5-101 F).     [name] alum & mag hydroxide-simeth (MINTOX) [phone] MG/5ML suspension Take 30 mLs by mouth every 6 (six) hours as needed for indigestion or heartburn.    [name] amLODipine (NORVASC) 5 MG tablet Take 5 mg by mouth at bedtime.    . ARIPiprazole (ABILIFY) 2 MG tablet Take 2 mg by mouth daily.    [name] aspirin 81 MG chewable tablet Chew 81 mg by mouth daily.    [name] buPROPion (WELLBUTRIN XL) 150 MG 24 hr tablet Take 150 mg by mouth every morning.    . carboxymethylcellulose (REFRESH TEARS) 0.5 % SOLN Place 1 drop into both eyes 4 (four) times daily.    . carvedilol (COREG) 12.5 MG tablet Take 12.5 mg by mouth 2 (two) times daily with a meal.    . cycloSPORINE (RESTASIS) 0.05 % ophthalmic emulsion Place 1 drop into both eyes 2 (two) times daily.    . diclofenac sodium (VOLTAREN) 1 % GEL Apply 2 g topically 4 (four) times daily. Rub into affected area of foot 2 to 4 times daily (Patient taking differently: Apply 2 g topically 2 (two) times daily. TO AFFECTED GREAT TOE) 100 g 2  . fentaNYL (DURAGESIC - DOSED MCG/HR) 50 MCG/HR Place 50 mcg onto the skin every 3 (three) days. REMOVE OLD PATCH FIRST    . ferrous sulfate 325 (65 FE) MG tablet Take 325 mg by mouth every Monday, Wednesday, and Friday.     . furosemide (LASIX) 40 MG tablet Take 1 tablet (40 mg total) by mouth daily. 90 tablet 3  . gabapentin (NEURONTIN) 300 MG capsule Take 1 capsule by mouth 2 (two) times daily.    [name] guaifenesin (ROBITUSSIN) 100 MG/5ML syrup Take 200 mg by mouth every 6 (six) hours as needed for cough. NOT TO EXCEED 4 DOSES/24 HOURS    . HYDROcodone-acetaminophen (NORCO/VICODIN) 5-325 MG tablet Take 1 tablet by mouth every evening.    . isosorbide mononitrate (IMDUR) 30 MG 24 hr tablet Take 30 mg by mouth daily.    . isosorbide mononitrate (IMDUR) 60 MG 24 hr tablet Take 1 tablet (60 mg  total) by mouth daily. 60 tablet 11  . latanoprost (XALATAN) 0.005 % ophthalmic solution Place 1 drop into both eyes at bedtime.    . Lidocaine (ASPERCREME LIDOCAINE) 4 % PTCH Apply 1 patch topically every evening. AND REMOVE FROM THE RIGHT KNEE EVERY MORNING    . LORazepam (ATIVAN) 0.5 MG tablet Take 0.5-1 mg by mouth See admin instructions. 0.5 mg in the morning and 1 mg at bedtime    .  losartan (COZAAR) 25 MG tablet Take 1 tablet by mouth daily.    . magnesium hydroxide (MILK OF MAGNESIA) 400 MG/5ML suspension Take 30 mLs by mouth at bedtime as needed for mild constipation.     . Melatonin 3 MG TABS Take 3 mg by mouth at bedtime.    . Multiple Vitamins-Minerals (CEROVITE ADVANCED FORMULA PO) Take 1 tablet by mouth daily.     [name] neomycin-bacitracin-polymyxin (NEOSPORIN) ointment Apply 1 application topically as needed for wound care (OR SKIN TEARS OR ABRASIONS).     . NONFORMULARY OR COMPOUNDED ITEM [facility]:  "Authorized Substitute" Pain Cream Formulation - Ibuprofen 15%, Gaclofen 1%, Gabapentin 3%, Lidocaine 2%, apply 2 grams to affected area 3 times daily. 120 each 2  . pantoprazole (PROTONIX) 40 MG tablet Take 40 mg by mouth daily.    . polyethylene glycol (MIRALAX / GLYCOLAX) packet Take 17 g by mouth daily as needed for severe constipation. MIXED INTO 4-8 OUNCES OF LIQUID    . potassium chloride SA (K-DUR,KLOR-CON) 20 MEQ tablet Take 1 tablet (20 mEq total) by mouth daily. 30 tablet 5  . vitamin B-12 (CYANOCOBALAMIN) 50 MCG tablet Take 50 mcg by mouth daily.     No facility-administered medications prior to visit.      Allergies:   Codeine   Social History   Social History  . Marital status: Divorced    Spouse name: N/A  . Number of children: N/A  . Years of education: N/A   Social History Main Topics  . Smoking status: Never Smoker  . Smokeless tobacco: Current User    Types: Snuff  . Alcohol use No  . Drug use: No  . Sexual activity: Not Asked   Other Topics  Concern  . None   Social History Narrative  . None     Family History:  The patient's family history includes Diabetes in her brother; Heart attack in her brother; Stomach cancer in her mother.   Review of Systems:   Please see the history of present illness.     General:  No chills, fever, night sweats or weight changes.  Cardiovascular:  No chest pain, dyspnea on exertion, palpitations. Positive for orthopnea, paroxysmal nocturnal dyspnea and edema. Dermatological: No rash, lesions/masses Respiratory: No cough, dyspnea Urologic: No hematuria, dysuria Abdominal:   No nausea, vomiting, diarrhea, bright red blood per rectum, melena, or hematemesis Neurologic:  No visual changes, wkns, changes in mental status.  All other systems reviewed and are otherwise negative except as noted above.   Physical Exam:    VS:  BP 117/62   Pulse 67   Ht 5\' 4"  (1.626 m)   Wt 209 lb (94.8 kg)   SpO2 97%   BMI 35.87 kg/m    General: Well developed, elderly African American female appearing in no acute distress. Head: Normocephalic, atraumatic, sclera non-icteric, no xanthomas, nares are without discharge.  Neck: No carotid bruits. JVD is difficult to assess as she is sitting in a wheelchair and is unable to reposition.  Lungs: Respirations regular and unlabored, without wheezes or rales.  Heart: Regular rate and rhythm. No S3 or S4.  No murmur, no rubs, or gallops appreciated. Abdomen: Soft, non-tender, non-distended with normoactive bowel sounds. No hepatomegaly. No rebound/guarding. No obvious abdominal masses. Msk:  Strength and tone appear normal for age. No joint deformities or effusions. Extremities: No clubbing or cyanosis. Trace lower extremity edema bilaterally.  Distal pedal pulses are 2+ bilaterally. Neuro: Alert and oriented X 3. Moves  all extremities spontaneously. No focal deficits noted. Psych:  Responds to questions appropriately with a normal affect. Skin: No rashes or lesions  noted  Wt Readings from Last 3 Encounters:  07/26/16 209 lb (94.8 kg)  07/19/16 212 lb (96.2 kg)  06/18/16 210 lb 4.8 oz (95.4 kg)     Studies/Labs Reviewed:   EKG:  EKG is not ordered today.  Recent Labs: 06/16/2016: Magnesium 2.1; TSH 2.958 06/18/2016: ALT 13 06/21/2016: Hemoglobin 10.7; Platelets 211 07/19/2016: BUN 30; Creatinine, Ser 1.18; Potassium 5.1; Sodium 142   Lipid Panel    Component Value Date/Time   CHOL 145 06/17/2016 0117   TRIG 52 06/17/2016 0117   HDL 49 06/17/2016 0117   CHOLHDL 3.0 06/17/2016 0117   VLDL 10 06/17/2016 0117   LDLCALC 86 06/17/2016 0117    Additional studies/ records that were reviewed today include:   Echocardiogram: 11/2012 Study Conclusions  - Left ventricle: The cavity size was normal. Wall thickness was increased in a pattern of mild LVH. Systolic function was mildly reduced. The estimated ejection fraction was in the range of 45% to 50%. Anteroseptal hypokinesis with incoordinate septal motion. Doppler parameters are consistent with abnormal left ventricular relaxation (grade 1 diastolic dysfunction). The E/e' ratio is <10, suggesting normal LV filling pressure. - Mitral valve: Calcified annulus. Valve area by pressure half-time: 0.78cm^2. - Right atrium: The atrium was at the upper limits of normal in size. - Atrial septum: Aneurysmal - 1.28 cm excursion. Poorly visualized. A patent foramen ovale cannot be excluded. - Tricuspid valve: Moderate regurgitation. - Pulmonary arteries: PA peak pressure: 91mm Hg (S). - Inferior vena cava: The vessel was dilated; the respirophasic diameter changes were blunted (< 50%); findings are consistent with elevated central venous pressure.   NST: 06/2016  ST segment depression was noted during stress in the I, II and aVF leads, beginning at 3 minutes of stress, ending at 3 minutes of stress, and returning to baseline after 5-9 minutes of recovery.  T wave  inversion was noted during stress. T wave inversion persisted.  The study is normal.  This is a low risk study.  The left ventricular ejection fraction is normal (55-65%).   Normal pharmacologic nuclear stress test with no evidence of prior infarct or ischemia.   Assessment:    1. Chronic diastolic heart failure ([place])   2. PND (paroxysmal nocturnal dyspnea)   3. Medication management   4. Atypical chest pain   5. Essential hypertension      Plan:   In order of problems listed above:  1. Chronic Diastolic CHF/ PND - last echo in 2014 showed an EF of 45-50%, at 55-65% by her NST in 06/2016. - at her last visit, Lasix was increased from 20mg  daily to 40mg  daily in the setting of her PND.  - she reports continual episodes of orthopnea and PND occurring since her last visit but says they have slightly improved. Weight is down 3 lbs since her last visit (212 lbs --> 209 lbs).  - with her continual symptoms, will maintain her Lasix dosing at 40mg  daily. Will recheck a BMET today to assess kidney function along with a BNP. Results should be called to her caregiver [name] at [phone]) per the patient's request.    2. Atypical Chest Pain - she has a history of nonischemic cardiomyopathy with EF at 35% in 2006 with normal cors by cath at that time. Recent  NST in 06/2016 showed no evidence of ischemia or prior infarction. -  no further testing indicated at this time.   3. HTN - BP is well-controlled at 117/62. - continue current medication regimen.   Medication Adjustments/Labs and Tests Ordered: Current medicines are reviewed at length with the patient today.  Concerns regarding medicines are outlined above.  Medication changes, Labs and Tests ordered today are listed in the Patient Instructions below. Patient Instructions  Medication Instructions:  Continue lasix 40mg  daily If you need a refill on your cardiac medications before your next appointment, please call your  pharmacy.  Labwork: BMP AND BNP TODAY HERE IN OUR OFFICE AT [facility]  Follow-Up: Your physician wants you to follow-up in: 4-6 [place].   Special Instructions: WE WILL CALL YOU WITH ABNORMAL LAB RESULTS.   Thank you for choosing [facility] at [facility], Erma Heritage, PA-C  07/26/2016 7:07 PM    [address], [address] [place], [place]  [address] Phone: [phone]; Fax: [phone]  [address] [place], [place] [address] Phone: [phone]

## 2016-07-26 ENCOUNTER — Ambulatory Visit (INDEPENDENT_AMBULATORY_CARE_PROVIDER_SITE_OTHER): Payer: Medicare Other | Admitting: Student

## 2016-07-26 ENCOUNTER — Telehealth: Payer: Self-pay | Admitting: Cardiovascular Disease

## 2016-07-26 ENCOUNTER — Encounter: Payer: Self-pay | Admitting: Student

## 2016-07-26 VITALS — BP 117/62 | HR 67 | Ht 64.0 in | Wt 209.0 lb

## 2016-07-26 DIAGNOSIS — I5032 Chronic diastolic (congestive) heart failure: Secondary | ICD-10-CM | POA: Diagnosis not present

## 2016-07-26 DIAGNOSIS — R0789 Other chest pain: Secondary | ICD-10-CM | POA: Diagnosis not present

## 2016-07-26 DIAGNOSIS — I10 Essential (primary) hypertension: Secondary | ICD-10-CM | POA: Diagnosis not present

## 2016-07-26 DIAGNOSIS — Z79899 Other long term (current) drug therapy: Secondary | ICD-10-CM | POA: Diagnosis not present

## 2016-07-26 DIAGNOSIS — R0600 Dyspnea, unspecified: Secondary | ICD-10-CM | POA: Diagnosis not present

## 2016-07-26 NOTE — Telephone Encounter (Signed)
Uncertain who Jennifer [name] is, no one is on the [facility]. Will make brittney strader pa aware.

## 2016-07-26 NOTE — Telephone Encounter (Signed)
New Message  Pincus Large call requesting to speak with RN. She states if pt needs lab work completed for today's appt. She would like pt to get this lab work completed at another location and have the results faxed to [facility]. Please call back to discuss

## 2016-07-26 NOTE — Patient Instructions (Signed)
Medication Instructions:  Continue lasix 40mg  daily If you need a refill on your cardiac medications before your next appointment, please call your pharmacy.  Labwork: BMP AND BNP TODAY HERE IN OUR OFFICE AT [facility]  Follow-Up: Your physician wants you to follow-up in: 4-6 [place].   Special Instructions: WE WILL CALL YOU WITH ABNORMAL LAB RESULTS.   Thank you for choosing [facility] at [facility]!!

## 2016-07-27 LAB — BASIC METABOLIC PANEL
BUN/Creatinine Ratio: 27 (ref 12–28)
BUN: 42 mg/dL — ABNORMAL HIGH (ref 8–27)
CALCIUM: 10 mg/dL (ref 8.7–10.3)
CO2: 26 mmol/L (ref 20–29)
Chloride: 103 mmol/L (ref 96–106)
Creatinine, Ser: 1.53 mg/dL — ABNORMAL HIGH (ref 0.57–1.00)
GFR, EST AFRICAN AMERICAN: 37 mL/min/{1.73_m2} — AB (ref >59)
GFR, EST NON AFRICAN AMERICAN: 32 mL/min/{1.73_m2} — AB (ref >59)
Glucose: 95 mg/dL (ref 65–99)
POTASSIUM: 5 mmol/L (ref 3.5–5.2)
Sodium: 142 mmol/L (ref 134–144)

## 2016-07-27 LAB — BRAIN NATRIURETIC PEPTIDE: BNP: 53.5 pg/mL (ref 0.0–100.0)

## 2016-08-01 ENCOUNTER — Other Ambulatory Visit: Payer: Self-pay

## 2016-08-01 MED ORDER — FUROSEMIDE 20 MG PO TABS: 20.0000 mg | ORAL_TABLET | Freq: Every day | ORAL | 3 refills | Status: AC

## 2016-08-01 NOTE — Telephone Encounter (Signed)
See lab results from 07/26/16. Furosemide decreased to 20 mg QD. Per Anderson Malta at [facility], they need a written order to make medication changes.  Will have [place] signed printed prescription and fax to [facility].

## 2016-08-23 ENCOUNTER — Ambulatory Visit (INDEPENDENT_AMBULATORY_CARE_PROVIDER_SITE_OTHER): Payer: Medicare Other | Admitting: Cardiovascular Disease

## 2016-08-23 ENCOUNTER — Encounter: Payer: Self-pay | Admitting: Cardiovascular Disease

## 2016-08-23 VITALS — BP 130/70 | HR 62 | Ht 64.0 in

## 2016-08-23 DIAGNOSIS — I428 Other cardiomyopathies: Secondary | ICD-10-CM | POA: Diagnosis not present

## 2016-08-23 DIAGNOSIS — I5032 Chronic diastolic (congestive) heart failure: Secondary | ICD-10-CM | POA: Diagnosis not present

## 2016-08-23 DIAGNOSIS — I447 Left bundle-branch block, unspecified: Secondary | ICD-10-CM | POA: Diagnosis not present

## 2016-08-23 DIAGNOSIS — I10 Essential (primary) hypertension: Secondary | ICD-10-CM | POA: Diagnosis not present

## 2016-08-23 DIAGNOSIS — Z79899 Other long term (current) drug therapy: Secondary | ICD-10-CM

## 2016-08-23 NOTE — Progress Notes (Signed)
Patient ID: Jennifer [name], female   DOB: 06-11-1935, 81 y.o.   MRN: [id number]     HPI: Jennifer [name] is a 81 y.o. female who presents for a follow-up cardiology evaluation.  I have not seen her since December 2015.  Jennifer [name] has a history of a nonischemic cardiomyopathy with an initial ejection fraction noted at cardiac catheterization in 2006 at 30%.  She had normal coronary arteries. Her ejection fraction had normalized to 50-55% on an echo Doppler study in September 2011. She has a history of chronic left bundle branch block, hypertension, and osteoporosis. A nuclear perfusion study in April 2012 showed normal perfusion with mild septal thinning noted secondary to left bundle branch block without ischemia.  She was hospitalized in October 2014 with chest pain which was felt to be atypical.  Endoscopy  showed a small hiatal hernia. In the past she had been noted to have iron deficiency. She has noticed some intermittent shoulder pain. She denies any recurrent episodes of chest pressure. An echo Doppler study at that time showed an ejection fraction in the 45-50% range. There was septal motion abnormality most likely due to her left bundle branch block. There was grade 1 diastolic dysfunction. Mild mitral annular calcification. There was moderate tricuspid regurgitation. There was mild pulmonary hypertension with estimated PA pressure 40 mm.  Ahe underwent right knee surgery on 06/19/2013 and tolerated this well from a cardiac standpoint.  Since I last saw her in 2015, she has been residing at [facility] and rehabilitation in [place].  She denies any chest pain or shortness of breath.  She states that she cannot walk and uses a wheelchair.  She has had surgery on both knees and has significant residual disability.  She is unaware of palpitations.  She has been seen in the office by Almyra Deforest, [place] and Melvyn Neth on several occasions, the last being by [place] on  07/26/2016.  Due to concerns for lower time any edema, her Lasix dose had been adjusted.  Presently, she has been on amlodipine 5 mg, carvedilol 12.5 mg twice a day, losartan 25 mg daily, isosorbide 30 mg, and furosemide 20 mg daily for her cardiac medications.  She denies any chest pain, PND or orthopnea.  She presents for cardiology reevaluation.    Past Medical History:  Diagnosis Date  . Anemia   . Anxiety   . Arthritis   . Atypical chest pain    a. 2006 Cath: nl cors;  b. 05/2010 MV: no ischemia;  c. 05/2013 MV: nl EF, no ischemia. d. 06/2016: NST showing no ischemia or prior infarction  . Blood transfusion   . Cardiomyopathy, nonischemic ([place])    a. 2006 EF initially 30%;  b. 10/2009 Echo EF now 50-55%; c. 11/2012 Echo: EF 45-50% w/ septal motion abnormality (LBBB), gr1 DD, mod TR, PASP 2mHg; d. 05/2013 MV: Nl EF by SPECT.  . Constipation due to pain medication   . Dementia   . Depression   . Diarrhea   . Frequency of urination    at night  . GERD (gastroesophageal reflux disease)   . Glaucoma   . Hypertension   . LBBB (left bundle branch block)   . Osteoporosis 04/13/2006  . Paget's disease of bone 11/29/2012  . Psychosis   . Renal disorder    only has one kidney; Right kidney stopped working after last child was born  . [place]    approximately 20 years since last  seizure  . Swelling of both ankles    Takes Lasix if needed  . Thyroid disease 04/13/2006   hypothyroidism  . TIA (transient ischemic attack)     Past Surgical History:  Procedure Laterality Date  . BREAST LUMPECTOMY Left    x 2  . CARDIAC CATHETERIZATION    . CHOLECYSTECTOMY    . ESOPHAGOGASTRODUODENOSCOPY N/A 11/30/2012   Procedure: ESOPHAGOGASTRODUODENOSCOPY (EGD);  Surgeon: Wonda Horner, MD;  Location: [facility] ENDOSCOPY;  Service: Endoscopy;  Laterality: N/A;  . EYE SURGERY     cataract removal pt unsure which eye  . FOOT SURGERY Left   . JOINT REPLACEMENT Bilateral    knees  . NEPHRECTOMY  1963   . PATELLAR TENDON REPAIR Right 07/12/2013   Procedure: PRIMARY LEFT PATELLA TENDON REPAIR;  Surgeon: Kerin Salen, MD;  Location: [facility];  Service: Orthopedics;  Laterality: Right;  . SHOULDER SURGERY Right   . TONSILLECTOMY    . TOTAL KNEE REVISION Right 06/19/2013   Procedure: RIGHT TOTAL KNEE REVISION;  Surgeon: Kerin Salen, MD;  Location: [facility];  Service: Orthopedics;  Laterality: Right;  . TUBAL LIGATION      Allergies  Allergen Reactions  . Codeine Nausea Only    Current Outpatient Prescriptions  Medication Sig Dispense Refill  . acetaminophen (TYLENOL) 500 MG tablet Take 500 mg by mouth every 4 (four) hours as needed for mild pain or headache (or fever of 99.5-101 F).     [name] alum & mag hydroxide-simeth (MINTOX) 924-268-34 MG/5ML suspension Take 30 mLs by mouth every 6 (six) hours as needed for indigestion or heartburn.    [name] amLODipine (NORVASC) 5 MG tablet Take 5 mg by mouth at bedtime.    . ARIPiprazole (ABILIFY) 2 MG tablet Take 2 mg by mouth daily.    [name] aspirin 81 MG chewable tablet Chew 81 mg by mouth daily.    [name] buPROPion (WELLBUTRIN XL) 150 MG 24 hr tablet Take 150 mg by mouth every morning.    . carboxymethylcellulose (REFRESH TEARS) 0.5 % SOLN Place 1 drop into both eyes 4 (four) times daily.    . carvedilol (COREG) 12.5 MG tablet Take 12.5 mg by mouth 2 (two) times daily with a meal.    . cycloSPORINE (RESTASIS) 0.05 % ophthalmic emulsion Place 1 drop into both eyes 2 (two) times daily.    . diclofenac sodium (VOLTAREN) 1 % GEL Apply 2 g topically 4 (four) times daily. Rub into affected area of foot 2 to 4 times daily (Patient taking differently: Apply 2 g topically 2 (two) times daily. TO AFFECTED GREAT TOE) 100 g 2  . fentaNYL (DURAGESIC - DOSED MCG/HR) 50 MCG/HR Place 50 mcg onto the skin every 3 (three) days. REMOVE OLD PATCH FIRST    . ferrous sulfate 325 (65 FE) MG tablet Take 325 mg by mouth every Monday, Wednesday, and Friday.     . furosemide (LASIX) 20 MG tablet  Take 1 tablet (20 mg total) by mouth daily. 90 tablet 3  . gabapentin (NEURONTIN) 300 MG capsule Take 1 capsule by mouth 2 (two) times daily.    [name] HYDROcodone-acetaminophen (NORCO/VICODIN) 5-325 MG tablet Take 1 tablet by mouth every evening.    . isosorbide mononitrate (IMDUR) 30 MG 24 hr tablet Take 30 mg by mouth daily.    [name] latanoprost (XALATAN) 0.005 % ophthalmic solution Place 1 drop into both eyes at bedtime.    . Lidocaine (ASPERCREME LIDOCAINE) 4 % PTCH Apply 1 patch topically  every evening. AND REMOVE FROM THE RIGHT KNEE EVERY MORNING    . LORazepam (ATIVAN) 0.5 MG tablet Take 0.5-1 mg by mouth See admin instructions. 0.5 mg in the morning and 1 mg at bedtime    . losartan (COZAAR) 25 MG tablet Take 1 tablet by mouth 2 (two) times daily.    . magnesium hydroxide (MILK OF MAGNESIA) 400 MG/5ML suspension Take 30 mLs by mouth at bedtime as needed for mild constipation.     . Melatonin 3 MG TABS Take 3 mg by mouth at bedtime.    . Multiple Vitamins-Minerals (CEROVITE ADVANCED FORMULA PO) Take 1 tablet by mouth daily.     [name] neomycin-bacitracin-polymyxin (NEOSPORIN) ointment Apply 1 application topically as needed for wound care (OR SKIN TEARS OR ABRASIONS).     . NONFORMULARY OR COMPOUNDED ITEM [facility]:  "Authorized Substitute" Pain Cream Formulation - Ibuprofen 15%, Gaclofen 1%, Gabapentin 3%, Lidocaine 2%, apply 2 grams to affected area 3 times daily. 120 each 2  . pantoprazole (PROTONIX) 40 MG tablet Take 40 mg by mouth daily.    . polyethylene glycol (MIRALAX / GLYCOLAX) packet Take 17 g by mouth daily as needed for severe constipation. MIXED INTO 4-8 OUNCES OF LIQUID    . potassium chloride SA (K-DUR,KLOR-CON) 20 MEQ tablet Take 1 tablet (20 mEq total) by mouth daily. 30 tablet 5  . vitamin B-12 (CYANOCOBALAMIN) 50 MCG tablet Take 50 mcg by mouth daily.     No current facility-administered medications for this visit.     Social History   Social History  . Marital status:  Divorced    Spouse name: N/A  . Number of children: N/A  . Years of education: N/A   Occupational History  . Not on file.   Social History Main Topics  . Smoking status: Never Smoker  . Smokeless tobacco: Current User    Types: Snuff  . Alcohol use No  . Drug use: No  . Sexual activity: Not on file   Other Topics Concern  . Not on file   Social History Narrative  . No narrative on file    Family History  Problem Relation Age of Onset  . Stomach cancer Mother   . Diabetes Brother   . Heart attack Brother     ROS General: Negative; No fevers, chills, or night sweats;  HEENT: Negative; No changes in vision or hearing, sinus congestion, difficulty swallowing Pulmonary: Negative; No cough, wheezing, shortness of breath, hemoptysis Cardiovascular: Negative; No chest pain, presyncope, syncope, palpitations Occasional leg swelling GI: Negative; No nausea, vomiting, diarrhea, or abdominal pain GU: Negative; No dysuria, hematuria, or difficulty voiding Musculoskeletal: Bilateral leg weakness and knee discomfort Hematologic/Oncology: Negative; no easy bruising, bleeding Endocrine: Negative; no heat/cold intolerance; no diabetes Neuro: Negative; no changes in balance, headaches Skin: Negative; No rashes or skin lesions Psychiatric: Negative; No behavioral problems, depression Sleep: Positive for history of restless legs; No snoring, daytime sleepiness, hypersomnolence, bruxism,  hypnogognic hallucinations, no cataplexy Other comprehensive 14 point system review is negative.   PE BP 130/70   Pulse 62   Ht _0  (1.626 m)    Repeat blood pressure by me 144/70. Wt Readings from Last 3 Encounters:  07/26/16 209 lb (94.8 kg)  07/19/16 212 lb (96.2 kg)  06/18/16 210 lb 4.8 oz (95.4 kg)   General: Alert, oriented, no distress.  Skin: normal turgor, no rashes, warm and dry HEENT: Normocephalic, atraumatic. Pupils equal round and reactive to light; sclera anicteric;  extraocular  muscles intact; Fundi Without hemorrhages or exudates Nose without nasal septal hypertrophy Mouth/Parynx benign; Mallinpatti scale 2 Neck: No JVD, no carotid bruits; normal carotid upstroke Lungs: clear to ausculatation and percussion; no wheezing or rales Chest wall: without tenderness to palpitation Heart: PMI not displaced, RRR, s1 s2 normal, 1/6 systolic murmur, no diastolic murmur, no rubs, gallops, thrills, or heaves Abdomen: soft, nontender; no hepatosplenomehaly, BS+; abdominal aorta nontender and not dilated by palpation. Back: no CVA tenderness Pulses 2+ Musculoskeletal: full range of motion, normal strength, no joint deformities Extremities: Trivial ankle swelling.  no clubbing cyanosis, Homan's sign negative  Neurologic: grossly nonfocal; Cranial nerves grossly wnl Psychologic: Normal mood and affect   ECG (independently read by me): Normal sinus rhythm at 62 bpm.  Left bundle-branch block with repolarization changes.  No ectopy.  QTc interval 454 ms.  December 2015 ECG (independently read by me, and (: Normal sinus rhythm at 63 bpm.  Left bundle branch block with repolarization changes.  PR interval 170 ms.  QTc interval 466 ms.  February 2015 ECG (independently read by me): Sinus rhythm with left bundle-branch block and associated repolarization changes.  LABS:  BMP Latest Ref Rng & Units 08/23/2016 07/26/2016 07/19/2016  Glucose 65 - 99 mg/dL 80 95 85  BUN 8 - 27 mg/dL 25 42(H) 30(H)  Creatinine 0.57 - 1.00 mg/dL 1.22(H) 1.53(H) 1.18(H)  BUN/Creat Ratio 12 - _0 Sodium 134 - 144 mmol/L 145(H) 142 142  Potassium 3.5 - 5.2 mmol/L 5.1 5.0 5.1  Chloride 96 - 106 mmol/L 106 103 106  CO2 20 - 29 mmol/L _1 Calcium 8.7 - 10.3 mg/dL 10.0 10.0 9.9   Hepatic Function Latest Ref Rng & Units 08/23/2016 06/18/2016 06/16/2016  Total Protein 6.0 - 8.5 g/dL 7.5 7.0 7.3  Albumin 3.5 - 4.8 g/dL 4.0 3.1(L) 3.3(L)  AST 0 - 40 IU/L _2 ALT 0 - 32 IU/L 11 13(L)  14  Alk Phosphatase 39 - 117 IU/L 171(H) 106 120  Total Bilirubin 0.0 - 1.2 mg/dL 0.3 0.6 0.6  Bilirubin, Direct 0.1 - 0.5 mg/dL - - 0.1   CBC Latest Ref Rng & Units 08/23/2016 06/21/2016 06/18/2016  WBC 3.4 - 10.8 x10E3/uL 4.8 4.5 5.5  Hemoglobin 11.1 - 15.9 g/dL 11.0(L) 10.7(L) 9.4(L)  Hematocrit 34.0 - 46.6 % 34.7 33.2(L) 30.1(L)  Platelets 150 - 379 x10E3/uL 326 211 266   Lab Results  Component Value Date   TSH 2.958 06/16/2016   Lab Results  Component Value Date   MCV 97 08/23/2016   MCV 96.2 06/21/2016   MCV 95.3 06/18/2016   Lab Results  Component Value Date   HGBA1C 4.9 06/16/2016   Lipid Panel     Component Value Date/Time   CHOL 145 06/17/2016 0117   TRIG 52 06/17/2016 0117   HDL 49 06/17/2016 0117   CHOLHDL 3.0 06/17/2016 0117   VLDL 10 06/17/2016 0117   LDLCALC 86 06/17/2016 0117    RADIOLOGY: No results found.  IMPRESSION:  1. Essential hypertension   2. Nonischemic cardiomyopathy ([place])   3. Chronic diastolic heart failure ([place])   4. Left bundle branch block   5. Medication management     ASSESSMENT AND PLAN: Jennifer [name] is a 81 year old female with a remote history of a documented nonischemic cardiomyopathy with initial ejection fraction approximately 30%. LV function has subsequently improved with medical therapy to 45-50% in 2014.  She underwent right knee surgery without cardiovascular  difficulty.  She has been documented have normal coronary arteries at catheterization in 2006 and her last nuclear perfusion study in 2012 continued to show normal perfusion with probable left bundle-branch block mild artifact.  Recently, she had had some issues with edema and possible volume overload.  Patient had been adjusted.  Presently, she is felt to have chronic diastolic heart failure.  With recent short-term adjustment of her furosemide.  She had lost weight as a result of diuresis.  Her blood pressure today was 144/70.  With her diastolic dysfunction.  I am  recommending slight titration of losartan from 25 up to 50 mg daily.  She will have a repeat be met in several weeks to make certain she is tolerating this from a renal standpoint.  She is not having any chest pain.  Her ECG is stable with sinus rhythm in the 60s and she has chronic left bundle branch block.  Recent laboratory has shown an LDL of 86.  I have recommended that she see [place] straighter in follow-up in 3-4 months in follow-up office visit with me in 6-8 months. Time spent: 25 minutes  Troy Sine, MD, [facility]  08/29/2016 9:50 PM

## 2016-08-23 NOTE — Patient Instructions (Signed)
Your physician has recommended you make the following change in your medication:   1.) the losartan has been increased to 25 mg twice a day.  Your physician recommends that you schedule a follow-up appointment in: 3-4 months with [place]. 6-8 months with Dr Claiborne Billings.  Your physician recommends that you return for lab work today in our office.

## 2016-08-24 LAB — COMPREHENSIVE METABOLIC PANEL
A/G RATIO: 1.1 — AB (ref 1.2–2.2)
ALT: 11 IU/L (ref 0–32)
AST: 20 IU/L (ref 0–40)
Albumin: 4 g/dL (ref 3.5–4.8)
Alkaline Phosphatase: 171 IU/L — ABNORMAL HIGH (ref 39–117)
BILIRUBIN TOTAL: 0.3 mg/dL (ref 0.0–1.2)
BUN/Creatinine Ratio: 20 (ref 12–28)
BUN: 25 mg/dL (ref 8–27)
CALCIUM: 10 mg/dL (ref 8.7–10.3)
CHLORIDE: 106 mmol/L (ref 96–106)
CO2: 25 mmol/L (ref 20–29)
Creatinine, Ser: 1.22 mg/dL — ABNORMAL HIGH (ref 0.57–1.00)
GFR, EST AFRICAN AMERICAN: 49 mL/min/{1.73_m2} — AB (ref >59)
GFR, EST NON AFRICAN AMERICAN: 43 mL/min/{1.73_m2} — AB (ref >59)
GLUCOSE: 80 mg/dL (ref 65–99)
Globulin, Total: 3.5 g/dL (ref 1.5–4.5)
POTASSIUM: 5.1 mmol/L (ref 3.5–5.2)
Sodium: 145 mmol/L — ABNORMAL HIGH (ref 134–144)
TOTAL PROTEIN: 7.5 g/dL (ref 6.0–8.5)

## 2016-08-24 LAB — CBC
HEMATOCRIT: 34.7 % (ref 34.0–46.6)
HEMOGLOBIN: 11 g/dL — AB (ref 11.1–15.9)
MCH: 30.7 pg (ref 26.6–33.0)
MCHC: 31.7 g/dL (ref 31.5–35.7)
MCV: 97 fL (ref 79–97)
Platelets: 326 10*3/uL (ref 150–379)
RBC: 3.58 x10E6/uL — AB (ref 3.77–5.28)
RDW: 14.5 % (ref 12.3–15.4)
WBC: 4.8 10*3/uL (ref 3.4–10.8)

## 2016-08-25 ENCOUNTER — Encounter: Payer: Self-pay | Admitting: *Deleted

## 2016-11-14 ENCOUNTER — Encounter: Payer: Self-pay | Admitting: Neurology

## 2016-11-14 ENCOUNTER — Ambulatory Visit (INDEPENDENT_AMBULATORY_CARE_PROVIDER_SITE_OTHER): Payer: Medicare Other | Admitting: Neurology

## 2016-11-14 VITALS — BP 180/96 | HR 69

## 2016-11-14 DIAGNOSIS — Z79899 Other long term (current) drug therapy: Secondary | ICD-10-CM

## 2016-11-14 DIAGNOSIS — F40240 Claustrophobia: Secondary | ICD-10-CM

## 2016-11-14 DIAGNOSIS — H4921 Sixth [abducent] nerve palsy, right eye: Secondary | ICD-10-CM | POA: Diagnosis not present

## 2016-11-14 DIAGNOSIS — R404 Transient alteration of awareness: Secondary | ICD-10-CM | POA: Diagnosis not present

## 2016-11-14 DIAGNOSIS — T50905A Adverse effect of unspecified drugs, medicaments and biological substances, initial encounter: Secondary | ICD-10-CM

## 2016-11-14 DIAGNOSIS — H02401 Unspecified ptosis of right eyelid: Secondary | ICD-10-CM

## 2016-11-14 DIAGNOSIS — H532 Diplopia: Secondary | ICD-10-CM | POA: Diagnosis not present

## 2016-11-14 MED ORDER — ALPRAZOLAM 0.5 MG PO TABS: ORAL_TABLET | ORAL | 0 refills | Status: DC

## 2016-11-14 NOTE — Patient Instructions (Signed)
We will check blood work today and call you with the test results. We will do a brain scan, called MRI and call you with the test results. We will have to schedule you for this on a separate date. This test requires authorization from your insurance, and we will take care of the insurance process. My major concern is that you are sleepy and lethargic from too many sedating medications, which can also cause you to have heavy or droopy eyelids.  We will call you with the results and we will make a follow up appointment as necessary.

## 2016-11-14 NOTE — Progress Notes (Signed)
Subjective:    Patient ID: Jennifer [name] is a 81 y.o. female.  HPI     Star Age, MD, PhD [facility] [address] [place], [place] [address]  Dear Dr. Katy Fitch,   I saw your patient, Jennifer [name], upon your kind request, in my neurologic clinic today for initial consultation of her diplopia and visual field loss. The patient is accompanied by an aide from [facility] today. As you know, [name] is a 81 year old right-handed woman with an underlying complex medical history of heart disease with nonischemic cardiomyopathy, left bundle branch block, dementia, depression, anxiety, anemia, arthritis, reflux disease, glaucoma, hypertension, osteoporosis and Paget's disease, hypothyroidism and history of TIA, remote history of seizure, status post multiple surgeries including bilateral knee replacement surgeries and right total knee revision, polypharmacy with multiple sedating medications on her list including Abilify, gabapentin, Ativan, fentanyl, melatonin, hydrocodone, and Wellbutrin, who reports a one-year history of right eyelid droopiness and 2-3 month history of left eyelid droopiness which since then has improved a little. She has had double vision and sixth nerve palsy for quite some time on the right side. She denies sudden onset of symptoms, she denies sudden onset of one-sided weakness or numbness and does not feel particularly weak in her upper or lower limbs. She is wheelchair-bound secondary to inability to walk she says. She has had multiple operations to her right knee and she cannot put weight on it. She also had left knee operation. I reviewed your office not from 11/03/16, which you kindly included.  She had a remote brain MRI without contrast and MRA head in 2006 but I could not pull up the reports. She had a head CT without contrast on 06/21/2016 which I reviewed:   IMPRESSION: Stable atrophy and chronic small vessel ischemia. No CT  findings of acute intracranial abnormality. She feels sleepy during the day. She feels that it is difficult for her to keep her eyes open and she frequently nods off. Her attendant from the nursing home does note that she is frequently a sleep while sitting.  Her Past Medical History Is Significant For: Past Medical History:  Diagnosis Date  . Anemia   . Anxiety   . Arthritis   . Atypical chest pain    a. 2006 Cath: nl cors;  b. 05/2010 MV: no ischemia;  c. 05/2013 MV: nl EF, no ischemia. d. 06/2016: NST showing no ischemia or prior infarction  . Blood transfusion   . Cardiomyopathy, nonischemic ([place])    a. 2006 EF initially 30%;  b. 10/2009 Echo EF now 50-55%; c. 11/2012 Echo: EF 45-50% w/ septal motion abnormality (LBBB), gr1 DD, mod TR, PASP 20mmHg; d. 05/2013 MV: Nl EF by SPECT.  . Constipation due to pain medication   . Dementia   . Depression   . Diarrhea   . Frequency of urination    at night  . GERD (gastroesophageal reflux disease)   . Glaucoma   . Hypertension   . LBBB (left bundle branch block)   . Osteoporosis 04/13/2006  . Paget's disease of bone 11/29/2012  . Psychosis ([place])   . Renal disorder    only has one kidney; Right kidney stopped working after last child was born  . Seizures ([place])    approximately 20 years since last seizure  . Swelling of both ankles    Takes Lasix if needed  . Thyroid disease 04/13/2006   hypothyroidism  . TIA (transient ischemic  attack)     Her Past Surgical History Is Significant For: Past Surgical History:  Procedure Laterality Date  . BREAST LUMPECTOMY Left    x 2  . CARDIAC CATHETERIZATION    . CHOLECYSTECTOMY    . ESOPHAGOGASTRODUODENOSCOPY N/A 11/30/2012   Procedure: ESOPHAGOGASTRODUODENOSCOPY (EGD);  Surgeon: Wonda Horner, MD;  Location: [facility] ENDOSCOPY;  Service: Endoscopy;  Laterality: N/A;  . EYE SURGERY     cataract removal pt unsure which eye  . FOOT SURGERY Left   . JOINT REPLACEMENT Bilateral    knees  . NEPHRECTOMY   1963  . PATELLAR TENDON REPAIR Right 07/12/2013   Procedure: PRIMARY LEFT PATELLA TENDON REPAIR;  Surgeon: Kerin Salen, MD;  Location: [facility];  Service: Orthopedics;  Laterality: Right;  . SHOULDER SURGERY Right   . TONSILLECTOMY    . TOTAL KNEE REVISION Right 06/19/2013   Procedure: RIGHT TOTAL KNEE REVISION;  Surgeon: Kerin Salen, MD;  Location: [facility];  Service: Orthopedics;  Laterality: Right;  . TUBAL LIGATION      Her Family History Is Significant For: Family History  Problem Relation Age of Onset  . Stomach cancer Mother   . Diabetes Brother   . Heart attack Brother     Her Social History Is Significant For: Social History   Social History  . Marital status: Divorced    Spouse name: N/A  . Number of children: N/A  . Years of education: N/A   Social History Main Topics  . Smoking status: Never Smoker  . Smokeless tobacco: Current User    Types: Snuff  . Alcohol use No  . Drug use: No  . Sexual activity: Not Asked   Other Topics Concern  . None   Social History Narrative  . None    Her Allergies Are:  Allergies  Allergen Reactions  . Codeine Nausea Only  :   Her Current Medications Are:  Outpatient Encounter Prescriptions as of 11/14/2016  Medication Sig  . acetaminophen (TYLENOL) 500 MG tablet Take 500 mg by mouth every 4 (four) hours as needed for mild pain or headache (or fever of 99.5-101 F).   [name] alum & mag hydroxide-simeth (MINTOX) 119-147-82 MG/5ML suspension Take 30 mLs by mouth every 6 (six) hours as needed for indigestion or heartburn.  . ARIPiprazole (ABILIFY) 2 MG tablet Take 2 mg by mouth daily.  [name] aspirin 81 MG chewable tablet Chew 81 mg by mouth daily.  [name] buPROPion (WELLBUTRIN XL) 150 MG 24 hr tablet Take 150 mg by mouth every morning.  . carboxymethylcellulose (REFRESH TEARS) 0.5 % SOLN Place 1 drop into both eyes 4 (four) times daily.  . carvedilol (COREG) 12.5 MG tablet Take 12.5 mg by mouth 2 (two) times daily with a meal.  . cloNIDine  (CATAPRES) 0.1 MG tablet Take 0.1 mg by mouth 2 (two) times daily.  . cycloSPORINE (RESTASIS) 0.05 % ophthalmic emulsion Place 1 drop into both eyes 2 (two) times daily.  . diclofenac sodium (VOLTAREN) 1 % GEL Apply 2 g topically 4 (four) times daily. Rub into affected area of foot 2 to 4 times daily (Patient taking differently: Apply 2 g topically 2 (two) times daily. TO AFFECTED GREAT TOE)  . fentaNYL (DURAGESIC - DOSED MCG/HR) 50 MCG/HR Place 50 mcg onto the skin every 3 (three) days. REMOVE OLD PATCH FIRST  . ferrous sulfate 325 (65 FE) MG tablet Take 325 mg by mouth every Monday, Wednesday, and Friday.   . gabapentin (NEURONTIN) 300 MG  capsule Take 1 capsule by mouth 2 (two) times daily.  [name] HYDROcodone-acetaminophen (NORCO/VICODIN) 5-325 MG tablet Take 1 tablet by mouth every evening.  [name] HYDROcodone-acetaminophen (NORCO/VICODIN) 5-325 MG tablet Take 1 tablet by mouth every 8 (eight) hours as needed for moderate pain.  . isosorbide mononitrate (IMDUR) 30 MG 24 hr tablet Take 30 mg by mouth daily.  [name] lisinopril (PRINIVIL,ZESTRIL) 10 MG tablet Take 10 mg by mouth daily.  [name] LORazepam (ATIVAN) 0.5 MG tablet Take 0.5-1 mg by mouth See admin instructions. 0.5 mg in the morning and 1 mg at bedtime  . losartan (COZAAR) 25 MG tablet Take 1 tablet by mouth 2 (two) times daily.  . magnesium hydroxide (MILK OF MAGNESIA) 400 MG/5ML suspension Take 30 mLs by mouth at bedtime as needed for mild constipation.   . Melatonin 3 MG TABS Take 3 mg by mouth at bedtime.  . Multiple Vitamins-Minerals (CEROVITE ADVANCED FORMULA PO) Take 1 tablet by mouth daily.   . pantoprazole (PROTONIX) 40 MG tablet Take 40 mg by mouth daily.  . polyethylene glycol (MIRALAX / GLYCOLAX) packet Take 17 g by mouth daily as needed for severe constipation. MIXED INTO 4-8 OUNCES OF LIQUID  . vitamin B-12 (CYANOCOBALAMIN) 100 MCG tablet Take 100 mcg by mouth daily.  . furosemide (LASIX) 20 MG tablet Take 1 tablet (20 mg total) by mouth  daily.  . NONFORMULARY OR COMPOUNDED ITEM [facility]:  "Authorized Substitute" Pain Cream Formulation - Ibuprofen 15%, Gaclofen 1%, Gabapentin 3%, Lidocaine 2%, apply 2 grams to affected area 3 times daily.  . [DISCONTINUED] amLODipine (NORVASC) 5 MG tablet Take 5 mg by mouth at bedtime.  . [DISCONTINUED] latanoprost (XALATAN) 0.005 % ophthalmic solution Place 1 drop into both eyes at bedtime.  . [DISCONTINUED] Lidocaine (ASPERCREME LIDOCAINE) 4 % PTCH Apply 1 patch topically every evening. AND REMOVE FROM THE RIGHT KNEE EVERY MORNING  . [DISCONTINUED] neomycin-bacitracin-polymyxin (NEOSPORIN) ointment Apply 1 application topically as needed for wound care (OR SKIN TEARS OR ABRASIONS).   . [DISCONTINUED] potassium chloride SA (K-DUR,KLOR-CON) 20 MEQ tablet Take 1 tablet (20 mEq total) by mouth daily.  . [DISCONTINUED] risperiDONE (RISPERDAL) 0.5 MG tablet Take 0.5 mg by mouth 2 (two) times daily.  . [DISCONTINUED] temazepam (RESTORIL) 15 MG capsule Take 15 mg by mouth at bedtime as needed for sleep.  . [DISCONTINUED] traMADol (ULTRAM) 50 MG tablet Take 50 mg by mouth.  . [DISCONTINUED] vitamin B-12 (CYANOCOBALAMIN) 50 MCG tablet Take 50 mcg by mouth daily.   No facility-administered encounter medications on file as of 11/14/2016.   :   Review of Systems:  Out of a complete 14 point review of systems, all are reviewed and negative with the exception of these symptoms as listed below:  Review of Systems  Neurological:       Pt presents today to discuss her eyelids "not staying open."    Objective:  Neurological Exam  Physical Exam Physical Examination:   Vitals:   11/14/16 0806  BP: (!) 180/96  Pulse: 69    General Examination: The patient is a very pleasant 81 y.o. female in no acute distress. She appears somewhat frail and deconditioned. She is sleepy, she looks like she doses off a couple of times.  HEENT: Normocephalic, atraumatic, pupils are reactive to light, she has  limitation to upper gaze, she has right sixth nerve palsy. Funduscopic exam is not possible for me. She has bilateral ptosis right more than left. She has no significant facial weakness or numbness. She  is edentulous in her speech but otherwise has no dysarthria. She has no significant facial asymmetry. Airway examination reveals mouth dryness.   Chest: Clear to auscultation without wheezing, rhonchi or crackles noted.  Heart: S1+S2+0, regular and normal without murmurs, rubs or gallops noted.   Abdomen: Soft, non-tender and non-distended with normal bowel sounds appreciated on auscultation.  Extremities: There is puffiness in both ankles, right more than left.  Skin: Warm and dry without trophic changes noted.   Musculoskeletal: exam reveals right more than left knee swelling.   Neurologically:  Mental status: The patient is awake, calm and intermittently alert, she is able to provide her history in part.   Cranial nerves II - XII are as described above under HEENT exam. In addition: shoulder shrug is normal. Motor exam:  Normal to thin bulk, global strength of 4-5 with limitation to left wrist strength from prior injury and limitation in range of motion of her right knee. Reflexes are 1+ in the upper extremities and absent in the lower extremities, sensory exam is intact to light touch, temperature, vibration in the upper and lower extremities.  She is unable to do heel-to-shin. She is unable to stand or walk for me. She is in the wheelchair.  Assessment and plan:   In summary, Ciarah Peace Burkland is a very pleasant 81 y.o.-year old female with an underlying complex medical history of heart disease with nonischemic cardiomyopathy, left bundle branch block, dementia, depression, anxiety, anemia, arthritis, reflux disease, glaucoma, hypertension, osteoporosis and Paget's disease, hypothyroidism and history of TIA, remote history of seizure, status post multiple surgeries including bilateral knee  replacement surgeries and right total knee revision, polypharmacy with multiple sedating medications on her list including Abilify, gabapentin, Ativan, fentanyl, melatonin, hydrocodone, and Wellbutrin, who reports a one-year history of right eyelid droopiness and 2-3 month history of left eyelid droopiness which since then has improved a little. She has had double vision and sixth nerve palsy for quite some time on the right side.  On examination she has limitation to upper gaze bilaterally, she has right sixth nerve palsy, she has intermittently droopy eyelids, intermittent alertness. She has mild global weakness but certainly limitations in range of motion and secondary to pain. My major concern is polypharmacy with oversedation. Nevertheless, we will do further workup in the form of brain MRI without contrast and blood test for myasthenia panel. We will call with test results and follow-up based on her test results. I would highly recommend reducing sedating medications as much as possible. She is requesting medication for her MRI and I prescribed 2 pills of Xanax as this is shorter acting than her existing prescription for Ativan which she also takes twice daily. I would not recommend MRI under anesthesia based on her medical history, age, and history of dementia.  I answered all her questions today and the patient was in agreement.  Thank you very much for allowing me to participate in the care of this nice patient. If I can be of any further assistance to you please do not hesitate to call me at [phone].  Sincerely,   Star Age, MD, PhD

## 2016-11-17 ENCOUNTER — Telehealth: Payer: Self-pay | Admitting: Neurology

## 2016-11-17 NOTE — Telephone Encounter (Signed)
Thank you, I appreciate this.  Cyril Mourning, will you talk to patient and advise her that we may need to get her in for an appointment in the near future to discuss test recent results and possible further testing (not sure, is she would be a candidate for EMG, single fiber testing, etc)?  Dr. Viona Gilmore has kindly agreed to see her.

## 2016-11-17 NOTE — Telephone Encounter (Signed)
Would one of you, Drs. Jannifer Franklin or Krista Blue, be willing to see this patient?  I saw pt recently for ptosis and variable diplopia. Ordered MG panel, which shows pos result for AchR Ab, but other results are pending. I would appreciate if a neuromuscular specialist would see this patient. Please advise.  I am sending a copy Cyril Mourning, as I will be out of town starting tomorrow.

## 2016-11-21 NOTE — Telephone Encounter (Signed)
I tried calling patient to schedule a follow up appointment with Dr. Jannifer Franklin. The number on file is to [facility], so I was routed to her personal phone but she did not answer and I could not leave a voicemail.

## 2016-11-22 ENCOUNTER — Encounter: Payer: Self-pay | Admitting: Cardiovascular Disease

## 2016-11-22 ENCOUNTER — Ambulatory Visit (INDEPENDENT_AMBULATORY_CARE_PROVIDER_SITE_OTHER): Payer: Medicare Other | Admitting: Cardiovascular Disease

## 2016-11-22 VITALS — BP 110/67 | HR 64 | Ht 64.0 in | Wt 193.0 lb

## 2016-11-22 DIAGNOSIS — I5032 Chronic diastolic (congestive) heart failure: Secondary | ICD-10-CM | POA: Diagnosis not present

## 2016-11-22 DIAGNOSIS — Z79899 Other long term (current) drug therapy: Secondary | ICD-10-CM

## 2016-11-22 DIAGNOSIS — I447 Left bundle-branch block, unspecified: Secondary | ICD-10-CM | POA: Diagnosis not present

## 2016-11-22 DIAGNOSIS — I10 Essential (primary) hypertension: Secondary | ICD-10-CM | POA: Diagnosis not present

## 2016-11-22 DIAGNOSIS — I428 Other cardiomyopathies: Secondary | ICD-10-CM | POA: Diagnosis not present

## 2016-11-22 LAB — BASIC METABOLIC PANEL
BUN/Creatinine Ratio: 24 (ref 12–28)
BUN: 30 mg/dL — ABNORMAL HIGH (ref 8–27)
CALCIUM: 10 mg/dL (ref 8.7–10.3)
CO2: 26 mmol/L (ref 20–29)
Chloride: 103 mmol/L (ref 96–106)
Creatinine, Ser: 1.26 mg/dL — ABNORMAL HIGH (ref 0.57–1.00)
GFR, EST AFRICAN AMERICAN: 47 mL/min/{1.73_m2} — AB (ref >59)
GFR, EST NON AFRICAN AMERICAN: 41 mL/min/{1.73_m2} — AB (ref >59)
Glucose: 91 mg/dL (ref 65–99)
POTASSIUM: 4.4 mmol/L (ref 3.5–5.2)
SODIUM: 143 mmol/L (ref 134–144)

## 2016-11-22 LAB — MYASTHENIA GRAVIS FULL PANEL
ACETYLCHOL BLOCK AB: 41 % — AB (ref 0–25)
AChR Binding Ab, Serum: 2.01 nmol/L — ABNORMAL HIGH (ref 0.00–0.24)
ANTI-STRIATION ABS: NEGATIVE

## 2016-11-22 NOTE — Patient Instructions (Signed)
Medication Instructions:  Your physician recommends that you continue on your current medications as directed. Please refer to the Current Medication list given to you today.  Labwork: TODAY (BMET)  Follow-Up: Your physician wants you to follow-up in: 51 MONTHS with Dr. Claiborne Billings.  You will receive a reminder letter in the mail two months in advance. If you don't receive a letter, please call our office to schedule the follow-up appointment.   Any Other Special Instructions Will Be Listed Below (If Applicable).     If you need a refill on your cardiac medications before your next appointment, please call your pharmacy.

## 2016-11-22 NOTE — Progress Notes (Signed)
Patient ID: Jennifer [name], female   DOB: 1935/06/13, 81 y.o.   MRN: [id number]     HPI: Jennifer [name] is a 81 y.o. female who presents for a 3 month follow-up cardiology evaluation.  Jennifer [name] has a history of a nonischemic cardiomyopathy with an initial ejection fraction noted at cardiac catheterization in 2006 at 30%.  She had normal coronary arteries. Her ejection fraction had normalized to 50-55% on an echo Doppler study in September 2011. She has a history of chronic left bundle branch block, hypertension, and osteoporosis. A nuclear perfusion study in April 2012 showed normal perfusion with mild septal thinning noted secondary to left bundle branch block without ischemia.  She was hospitalized in October 2014 with chest pain which was felt to be atypical.  Endoscopy  showed a small hiatal hernia. In the past she had been noted to have iron deficiency. She has noticed some intermittent shoulder pain. She denies any recurrent episodes of chest pressure. An echo Doppler study at that time showed an ejection fraction in the 45-50% range. There was septal motion abnormality most likely due to her left bundle branch block. There was grade 1 diastolic dysfunction. Mild mitral annular calcification. There was moderate tricuspid regurgitation. There was mild pulmonary hypertension with estimated PA pressure 40 mm.  Ahe underwent right knee surgery on 06/19/2013 and tolerated this well from a cardiac standpoint.  She has been residing at [facility] and rehabilitation.  She denies any chest pain or shortness of breath.  She states that she cannot walk and uses a wheelchair.  She has had surgery on both knees and has significant residual disability.  She is unaware of palpitations.  She has been seen in the office by Almyra Deforest, Snyder and Melvyn Neth on several occasions, the last being by [place] on 07/26/2016.  Due to concerns for lower time any edema, her Lasix dose had been adjusted.  When I last  saw her, after not having seen her in 3 years she was on  amlodipine 5 mg, carvedilol 12.5 mg twice a day, losartan 25 mg daily, isosorbide 30 mg, and furosemide 20 mg daily for her cardiac medications.  She denied any chest pain, PND or orthopnea. When I saw her, I felt she had chronic diastolic heart failure and with her elevation of blood pressure.  I increased losartan from 25 up to 50 mg daily.  She has felt better on this increased regimen, although at times gets mild shortness of breath.  She essentially is in a wheelchair all day.  She does not exercise.  She presents for reevaluation.  Past Medical History:  Diagnosis Date  . Anemia   . Anxiety   . Arthritis   . Atypical chest pain    a. 2006 Cath: nl cors;  b. 05/2010 MV: no ischemia;  c. 05/2013 MV: nl EF, no ischemia. d. 06/2016: NST showing no ischemia or prior infarction  . Blood transfusion   . Cardiomyopathy, nonischemic ([place])    a. 2006 EF initially 30%;  b. 10/2009 Echo EF now 50-55%; c. 11/2012 Echo: EF 45-50% w/ septal motion abnormality (LBBB), gr1 DD, mod TR, PASP 31mHg; d. 05/2013 MV: Nl EF by SPECT.  . Constipation due to pain medication   . Dementia   . Depression   . Diarrhea   . Frequency of urination    at night  . GERD (gastroesophageal reflux disease)   . Glaucoma   . Hypertension   . LBBB (left  bundle branch block)   . Osteoporosis 04/13/2006  . Paget's disease of bone 11/29/2012  . Psychosis ([place])   . Renal disorder    only has one kidney; Right kidney stopped working after last child was born  . Seizures ([place])    approximately 20 years since last seizure  . Swelling of both ankles    Takes Lasix if needed  . Thyroid disease 04/13/2006   hypothyroidism  . TIA (transient ischemic attack)     Past Surgical History:  Procedure Laterality Date  . BREAST LUMPECTOMY Left    x 2  . CARDIAC CATHETERIZATION    . CHOLECYSTECTOMY    . ESOPHAGOGASTRODUODENOSCOPY N/A 11/30/2012   Procedure:  ESOPHAGOGASTRODUODENOSCOPY (EGD);  Surgeon: Wonda Horner, MD;  Location: [facility] ENDOSCOPY;  Service: Endoscopy;  Laterality: N/A;  . EYE SURGERY     cataract removal pt unsure which eye  . FOOT SURGERY Left   . JOINT REPLACEMENT Bilateral    knees  . NEPHRECTOMY  1963  . PATELLAR TENDON REPAIR Right 07/12/2013   Procedure: PRIMARY LEFT PATELLA TENDON REPAIR;  Surgeon: Kerin Salen, MD;  Location: [place];  Service: Orthopedics;  Laterality: Right;  . SHOULDER SURGERY Right   . TONSILLECTOMY    . TOTAL KNEE REVISION Right 06/19/2013   Procedure: RIGHT TOTAL KNEE REVISION;  Surgeon: Kerin Salen, MD;  Location: [place];  Service: Orthopedics;  Laterality: Right;  . TUBAL LIGATION      Allergies  Allergen Reactions  . Codeine Nausea Only    Current Outpatient Prescriptions  Medication Sig Dispense Refill  . acetaminophen (TYLENOL) 500 MG tablet Take 500 mg by mouth every 4 (four) hours as needed for mild pain or headache (or fever of 99.5-101 F).     . ALPRAZolam (XANAX) 0.5 MG tablet Take 1-2 pills as needed on call to MRI. 2 tablet 0  . alum & mag hydroxide-simeth (MINTOX) 419-622-29 MG/5ML suspension Take 30 mLs by mouth every 6 (six) hours as needed for indigestion or heartburn.    . ARIPiprazole (ABILIFY) 2 MG tablet Take 2 mg by mouth daily.    [name] aspirin 81 MG chewable tablet Chew 81 mg by mouth daily.    [name] buPROPion (WELLBUTRIN XL) 150 MG 24 hr tablet Take 150 mg by mouth every morning.    . carboxymethylcellulose (REFRESH TEARS) 0.5 % SOLN Place 1 drop into both eyes 4 (four) times daily.    . carvedilol (COREG) 12.5 MG tablet Take 12.5 mg by mouth 2 (two) times daily with a meal.    . cloNIDine (CATAPRES) 0.1 MG tablet Take 0.1 mg by mouth 2 (two) times daily.    . cycloSPORINE (RESTASIS) 0.05 % ophthalmic emulsion Place 1 drop into both eyes 2 (two) times daily.    . diclofenac sodium (VOLTAREN) 1 % GEL Apply 2 g topically 4 (four) times daily. Rub into affected area of foot 2 to 4  times daily (Patient taking differently: Apply 2 g topically 2 (two) times daily. TO AFFECTED GREAT TOE) 100 g 2  . fentaNYL (DURAGESIC - DOSED MCG/HR) 50 MCG/HR Place 50 mcg onto the skin every 3 (three) days. REMOVE OLD PATCH FIRST    . ferrous sulfate 325 (65 FE) MG tablet Take 325 mg by mouth every Monday, Wednesday, and Friday.     . furosemide (LASIX) 20 MG tablet Take 20 mg by mouth daily.    [name] gabapentin (NEURONTIN) 300 MG capsule Take 1 capsule by mouth 2 (  two) times daily.    [name] HYDROcodone-acetaminophen (NORCO/VICODIN) 5-325 MG tablet Take 1 tablet by mouth every 8 (eight) hours as needed for moderate pain.    . isosorbide mononitrate (IMDUR) 30 MG 24 hr tablet Take 30 mg by mouth daily.    [name] LORazepam (ATIVAN) 0.5 MG tablet Take 0.5-1 mg by mouth See admin instructions. 0.5 mg in the morning and 1 mg at bedtime    . losartan (COZAAR) 25 MG tablet Take 1 tablet by mouth 2 (two) times daily.    . magnesium hydroxide (MILK OF MAGNESIA) 400 MG/5ML suspension Take 30 mLs by mouth at bedtime as needed for mild constipation.     . Melatonin 3 MG TABS Take 3 mg by mouth at bedtime.    . Multiple Vitamins-Minerals (CEROVITE ADVANCED FORMULA PO) Take 1 tablet by mouth daily.     . NONFORMULARY OR COMPOUNDED ITEM [facility]:  "Authorized Substitute" Pain Cream Formulation - Ibuprofen 15%, Gaclofen 1%, Gabapentin 3%, Lidocaine 2%, apply 2 grams to affected area 3 times daily. 120 each 2  . pantoprazole (PROTONIX) 40 MG tablet Take 40 mg by mouth daily.    . polyethylene glycol (MIRALAX / GLYCOLAX) packet Take 17 g by mouth daily as needed for severe constipation. MIXED INTO 4-8 OUNCES OF LIQUID    . vitamin B-12 (CYANOCOBALAMIN) 100 MCG tablet Take 100 mcg by mouth daily.    . furosemide (LASIX) 20 MG tablet Take 1 tablet (20 mg total) by mouth daily. 90 tablet 3   No current facility-administered medications for this visit.     Social History   Social History  . Marital status:  Divorced    Spouse name: N/A  . Number of children: N/A  . Years of education: N/A   Occupational History  . Not on file.   Social History Main Topics  . Smoking status: Never Smoker  . Smokeless tobacco: Current User    Types: Snuff  . Alcohol use No  . Drug use: No  . Sexual activity: Not on file   Other Topics Concern  . Not on file   Social History Narrative  . No narrative on file    Family History  Problem Relation Age of Onset  . Stomach cancer Mother   . Diabetes Brother   . Heart attack Brother     ROS General: Negative; No fevers, chills, or night sweats;  HEENT: Negative; No changes in vision or hearing, sinus congestion, difficulty swallowing Pulmonary: Negative; No cough, wheezing, shortness of breath, hemoptysis Cardiovascular: Negative; No chest pain, presyncope, syncope, palpitations Occasional leg swelling GI: Negative; No nausea, vomiting, diarrhea, or abdominal pain GU: Negative; No dysuria, hematuria, or difficulty voiding Musculoskeletal: Bilateral leg weakness and knee discomfort Hematologic/Oncology: Negative; no easy bruising, bleeding Endocrine: Negative; no heat/cold intolerance; no diabetes Neuro: Negative; no changes in balance, headaches Skin: Negative; No rashes or skin lesions Psychiatric: Negative; No behavioral problems, depression Sleep: Positive for history of restless legs; No snoring, daytime sleepiness, hypersomnolence, bruxism,  hypnogognic hallucinations, no cataplexy Other comprehensive 14 point system review is negative.   PE BP 110/67   Pulse 64   Ht _0  (1.626 m)   Wt 193 lb (87.5 kg)   SpO2 96%   BMI 33.13 kg/m    Repeat blood pressure was 118/70  Wt Readings from Last 3 Encounters:  11/22/16 193 lb (87.5 kg)  07/26/16 209 lb (94.8 kg)  07/19/16 212 lb (96.2 kg)   General: Alert, oriented, no  distress.  Skin: normal turgor, no rashes, warm and dry HEENT: Normocephalic, atraumatic. Pupils equal round and  reactive to light; sclera anicteric; extraocular muscles intact;  Nose without nasal septal hypertrophy Mouth/Parynx benign; Mallinpatti scale 3 Neck: No JVD, no carotid bruits; normal carotid upstroke Lungs: clear to ausculatation and percussion; no wheezing or rales Chest wall: without tenderness to palpitation Heart: PMI not displaced, RRR, s1 s2 normal, 1/6 systolic murmur, no diastolic murmur, no rubs, gallops, thrills, or heaves Abdomen: soft, nontender; no hepatosplenomehaly, BS+; abdominal aorta nontender and not dilated by palpation. Back: no CVA tenderness Pulses 2+ Musculoskeletal: full range of motion, normal strength, no joint deformities Extremities: Trace ankle edema bilaterally no clubbing, cyanosis, Homan's sign negative  Neurologic: grossly nonfocal; Cranial nerves grossly wnl Psychologic: Normal mood and affect   ECG (independently read by me): Normal sinus rhythm at 64 bpm.  Left bundle branch block with repolarization changes.  July 2018 ECG (independently read by me): Normal sinus rhythm at 62 bpm.  Left bundle-branch block with repolarization changes.  No ectopy.  QTc interval 454 ms.  December 2015 ECG (independently read by me, and (: Normal sinus rhythm at 63 bpm.  Left bundle branch block with repolarization changes.  PR interval 170 ms.  QTc interval 466 ms.  February 2015 ECG (independently read by me): Sinus rhythm with left bundle-branch block and associated repolarization changes.  LABS:  BMP Latest Ref Rng & Units 11/22/2016 08/23/2016 07/26/2016  Glucose 65 - 99 mg/dL 91 80 95  BUN 8 - 27 mg/dL 30(H) 25 42(H)  Creatinine 0.57 - 1.00 mg/dL 1.26(H) 1.22(H) 1.53(H)  BUN/Creat Ratio 12 - _0 Sodium 134 - 144 mmol/L 143 145(H) 142  Potassium 3.5 - 5.2 mmol/L 4.4 5.1 5.0  Chloride 96 - 106 mmol/L 103 106 103  CO2 20 - 29 mmol/L _1 Calcium 8.7 - 10.3 mg/dL 10.0 10.0 10.0   Hepatic Function Latest Ref Rng & Units 08/23/2016 06/18/2016 06/16/2016    Total Protein 6.0 - 8.5 g/dL 7.5 7.0 7.3  Albumin 3.5 - 4.8 g/dL 4.0 3.1(L) 3.3(L)  AST 0 - 40 IU/L _2 ALT 0 - 32 IU/L 11 13(L) 14  Alk Phosphatase 39 - 117 IU/L 171(H) 106 120  Total Bilirubin 0.0 - 1.2 mg/dL 0.3 0.6 0.6  Bilirubin, Direct 0.1 - 0.5 mg/dL - - 0.1   CBC Latest Ref Rng & Units 08/23/2016 06/21/2016 06/18/2016  WBC 3.4 - 10.8 x10E3/uL 4.8 4.5 5.5  Hemoglobin 11.1 - 15.9 g/dL 11.0(L) 10.7(L) 9.4(L)  Hematocrit 34.0 - 46.6 % 34.7 33.2(L) 30.1(L)  Platelets 150 - 379 x10E3/uL 326 211 266   Lab Results  Component Value Date   TSH 2.958 06/16/2016   Lab Results  Component Value Date   MCV 97 08/23/2016   MCV 96.2 06/21/2016   MCV 95.3 06/18/2016   Lab Results  Component Value Date   HGBA1C 4.9 06/16/2016   Lipid Panel     Component Value Date/Time   CHOL 145 06/17/2016 0117   TRIG 52 06/17/2016 0117   HDL 49 06/17/2016 0117   CHOLHDL 3.0 06/17/2016 0117   VLDL 10 06/17/2016 0117   LDLCALC 86 06/17/2016 0117    RADIOLOGY: No results found.  IMPRESSION:  1. Medication management   2. Nonischemic cardiomyopathy ([place])   3. Essential hypertension   4. Chronic diastolic heart failure ([place])   5. Left bundle branch block     ASSESSMENT AND  PLAN: Jennifer [name] is a 81 year old female with a remote history of a documented nonischemic cardiomyopathy with initial ejection fraction approximately 30%. LV function has subsequently improved with medical therapy to 45-50% in 2014.  She has been documented have normal coronary arteries at catheterization in 2006 and her last nuclear perfusion study in 2012 continued to show normal perfusion with probable left bundle-branch block mild artifact.  Recently, she had had some issues with edema and possible volume overload.  Patient had been adjusted.  She now has been on furosemide 20 mg daily, losartan 25 mg twice a day, carvedilol 12.5 mg twice a day.  Her blood pressure has improved.  Her shortness of breath has  improved.  She is aerobically deconditioned, which undoubtedly can contribute to shortness of breath with activity as well as her diastolic dysfunction.  Oxygen saturation today was 96%.  Her ECG remained stable and she is in sinus rhythm.  She has mild renal insufficiency.  I have recommended a follow-up BMET be obtained on her losartan and Lasix therapy.  She continues to have left bundle-branch block, which is stable.  I will see her one year for reevaluation.  Time spent: 25 minutes  Troy Sine, MD, [facility]  11/22/2016 7:31 PM

## 2016-12-07 NOTE — Telephone Encounter (Signed)
I was going to call patient again today but I noticed that she is scheduled to see Dr. Rexene Alberts tomorrow.

## 2016-12-07 NOTE — Telephone Encounter (Signed)
I spent 10 minutes on the phone with [facility]. I spoke with Kenney Houseman, pt's nurse, and Whitney, the transportation specialist. I was unable to speak with the pt. I advised both of these staff members that pt's appt on 12/08/16 with Dr. Rexene Alberts needs to be cancelled and moved to Friday 12/09/16 with Dr. Jannifer Franklin at 11:00am. Both Kenney Houseman and Whitney verbalized understanding of this change.

## 2016-12-08 ENCOUNTER — Ambulatory Visit: Payer: Medicare Other | Admitting: Neurology

## 2016-12-09 ENCOUNTER — Ambulatory Visit (INDEPENDENT_AMBULATORY_CARE_PROVIDER_SITE_OTHER): Payer: Medicare Other | Admitting: Neurology

## 2016-12-09 ENCOUNTER — Encounter: Payer: Self-pay | Admitting: Neurology

## 2016-12-09 DIAGNOSIS — G7000 Myasthenia gravis without (acute) exacerbation: Secondary | ICD-10-CM

## 2016-12-09 HISTORY — DX: Myasthenia gravis without (acute) exacerbation: G70.00

## 2016-12-09 MED ORDER — PYRIDOSTIGMINE BROMIDE 60 MG PO TABS: 30.0000 mg | ORAL_TABLET | Freq: Four times a day (QID) | ORAL | Status: DC

## 2016-12-09 MED ORDER — PREDNISONE 20 MG PO TABS: 20.0000 mg | ORAL_TABLET | Freq: Every day | ORAL | 3 refills | Status: DC

## 2016-12-09 NOTE — Progress Notes (Signed)
Reason for visit: Myasthenia gravis  Jennifer [name] is a 81 y.o. female  History of present illness:  Jennifer [name] is a 81 year old left-handed black female with a history of significant degenerative arthritis affecting the knees and hips, she is nonambulatory.  One year ago she began noticing some problems with double vision and later began noticing troubles with ptosis of the eyelids.  At times, both eyelids may close down completely and she is unable to see.  Even though she has ongoing divergence of gaze, she no longer sees double vision at this time.  She reports some occasional problems with choking with swallowing, she denies weakness with chewing.  She has recently developed neck pain over the last week, she has had some left arm weakness over the last 2 weeks.  She denies any significant shortness of breath.  She reports no significant back pain or pain down the legs.  She has no numbness of the extremities.  She currently is residing at [facility] in an extended care facility.  She was seen through this office by Dr. Rexene Alberts, and blood work revealed evidence of positive ACH receptor antibodies confirming the diagnosis of myasthenia gravis.  The patient has chronic renal insufficiency.  She comes to this office for an evaluation.   Past Medical History:  Diagnosis Date  . Anemia   . Anxiety   . Arthritis   . Atypical chest pain    a. 2006 Cath: nl cors;  b. 05/2010 MV: no ischemia;  c. 05/2013 MV: nl EF, no ischemia. d. 06/2016: NST showing no ischemia or prior infarction  . Blood transfusion   . Cardiomyopathy, nonischemic ([place])    a. 2006 EF initially 30%;  b. 10/2009 Echo EF now 50-55%; c. 11/2012 Echo: EF 45-50% w/ septal motion abnormality (LBBB), gr1 DD, mod TR, PASP 64mmHg; d. 05/2013 MV: Nl EF by SPECT.  . Constipation due to pain medication   . Dementia   . Depression   . Diarrhea   . Frequency of urination    at night  . GERD (gastroesophageal reflux disease)   . Glaucoma    . Hypertension   . LBBB (left bundle branch block)   . Myasthenia gravis ([place]) 12/09/2016  . Osteoporosis 04/13/2006  . Paget's disease of bone 11/29/2012  . Psychosis ([place])   . Renal disorder    only has one kidney; Right kidney stopped working after last child was born  . Seizures ([place])    approximately 20 years since last seizure  . Swelling of both ankles    Takes Lasix if needed  . Thyroid disease 04/13/2006   hypothyroidism  . TIA (transient ischemic attack)     Past Surgical History:  Procedure Laterality Date  . BREAST LUMPECTOMY Left    x 2  . CARDIAC CATHETERIZATION    . CHOLECYSTECTOMY    . ESOPHAGOGASTRODUODENOSCOPY N/A 11/30/2012   Procedure: ESOPHAGOGASTRODUODENOSCOPY (EGD);  Surgeon: Wonda Horner, MD;  Location: [facility] ENDOSCOPY;  Service: Endoscopy;  Laterality: N/A;  . EYE SURGERY     cataract removal pt unsure which eye  . FOOT SURGERY Left   . JOINT REPLACEMENT Bilateral    knees  . NEPHRECTOMY  1963  . PATELLAR TENDON REPAIR Right 07/12/2013   Procedure: PRIMARY LEFT PATELLA TENDON REPAIR;  Surgeon: Kerin Salen, MD;  Location: [place];  Service: Orthopedics;  Laterality: Right;  . SHOULDER SURGERY Right   . TONSILLECTOMY    . TOTAL KNEE REVISION  Right 06/19/2013   Procedure: RIGHT TOTAL KNEE REVISION;  Surgeon: Kerin Salen, MD;  Location: [place];  Service: Orthopedics;  Laterality: Right;  . TUBAL LIGATION      Family History  Problem Relation Age of Onset  . Stomach cancer Mother   . Diabetes Brother   . Heart attack Brother     Social history:  reports that she has never smoked. Her smokeless tobacco use includes Snuff. She reports that she does not drink alcohol or use drugs.  Medications:  Prior to Admission medications   Medication Sig Start Date End Date Taking? Authorizing Provider  alum & mag hydroxide-simeth ([place]) 200-200-20 MG/5ML suspension Take 30 mLs by mouth every 6 (six) hours as needed for indigestion or heartburn.   Yes  [provider]  ARIPiprazole (ABILIFY) 2 MG tablet Take 2 mg by mouth daily.   Yes [provider]  aspirin 81 MG chewable tablet Chew 81 mg by mouth daily.   Yes [provider]  buPROPion (WELLBUTRIN XL) 150 MG 24 hr tablet Take 150 mg by mouth every morning.   Yes [provider]  carboxymethylcellulose (REFRESH TEARS) 0.5 % SOLN Place 1 drop into both eyes 4 (four) times daily.   Yes [provider]  carvedilol (COREG) 12.5 MG tablet Take 12.5 mg by mouth 2 (two) times daily with a meal.   Yes [provider]  cycloSPORINE (RESTASIS) 0.05 % ophthalmic emulsion Place 1 drop into both eyes 2 (two) times daily.   Yes [provider]  diclofenac sodium (VOLTAREN) 1 % GEL Apply 2 g topically 4 (four) times daily. Rub into affected area of foot 2 to 4 times daily Patient taking differently: Apply 2 g topically 2 (two) times daily. TO AFFECTED GREAT TOE 03/10/16  Yes Trula Slade, DPM  fentaNYL (DURAGESIC - DOSED MCG/HR) 50 MCG/HR Place 50 mcg onto the skin every 3 (three) days. REMOVE OLD [facility] FIRST   Yes [provider]  ferrous sulfate 325 (65 FE) MG tablet Take 325 mg by mouth every Monday, Wednesday, and Friday.    Yes [provider]  furosemide (LASIX) 20 MG tablet Take 20 mg by mouth daily.   Yes [provider]  gabapentin (NEURONTIN) 300 MG capsule Take 1 capsule by mouth 2 (two) times daily. 05/05/16  Yes [provider]  HYDROcodone-acetaminophen (NORCO/VICODIN) 5-325 MG tablet Take 1 tablet by mouth every 8 (eight) hours as needed for moderate pain.   Yes [provider]  isosorbide mononitrate (IMDUR) 30 MG 24 hr tablet Take 30 mg by mouth daily. 05/23/16  Yes [provider]  LORazepam (ATIVAN) 0.5 MG tablet Take 0.5-1 mg by mouth See admin instructions. 0.5 mg in the morning and 1 mg at bedtime   Yes [provider]  losartan (COZAAR) 25 MG tablet Take 1  tablet by mouth 2 (two) times daily. 05/10/16  Yes [provider]  magnesium hydroxide (MILK OF MAGNESIA) 400 MG/5ML suspension Take 30 mLs by mouth at bedtime as needed for mild constipation.    Yes [provider]  Melatonin 3 MG TABS Take 3 mg by mouth at bedtime.   Yes [provider]  Multiple Vitamins-Minerals (CEROVITE ADVANCED FORMULA PO) Take 1 tablet by mouth daily.    Yes [provider]  NONFORMULARY OR COMPOUNDED ITEM [facility]:  "Authorized Substitute" Pain Cream Formulation - Ibuprofen 15%, Gaclofen 1%, Gabapentin 3%, Lidocaine 2%, apply 2 grams to affected area 3 times  daily. 03/16/16  Yes Trula Slade, DPM  pantoprazole (PROTONIX) 40 MG tablet Take 40 mg by mouth daily.   Yes [provider]  polyethylene glycol (MIRALAX / GLYCOLAX) packet Take 17 g by mouth daily as needed for severe constipation. MIXED INTO 4-8 OUNCES OF LIQUID   Yes [provider]  vitamin B-12 (CYANOCOBALAMIN) 100 MCG tablet Take 100 mcg by mouth daily.   Yes [provider]  acetaminophen (TYLENOL) 500 MG tablet Take 500 mg by mouth every 4 (four) hours as needed for mild pain or headache (or fever of 99.5-101 F).     [provider]  ALPRAZolam Duanne Moron) 0.5 MG tablet Take 1-2 pills as needed on call to MRI. Patient not taking: Reported on 12/09/2016 11/14/16   Star Age, MD  cloNIDine (CATAPRES) 0.1 MG tablet Take 0.1 mg by mouth 2 (two) times daily.    [provider]  furosemide (LASIX) 20 MG tablet Take 1 tablet (20 mg total) by mouth daily. 08/01/16 10/30/16  Erma Heritage, PA-C      Allergies  Allergen Reactions  . Codeine Nausea Only    ROS:  Out of a complete 14 system review of symptoms, the patient complains only of the following symptoms, and all other reviewed systems are negative.  Double vision Droopy eyelids Neck pain  Blood pressure 129/70, pulse 65, resp. rate 20, height 5\' 4"  (1.626  m), weight 193 lb (87.5 kg).  Physical Exam  General: The patient is alert and cooperative at the time of the examination.  The patient is moderately obese.  Eyes: Pupils are equal, round, and reactive to light. Discs are flat bilaterally.  Bilateral ptosis is seen.  Neck: The neck is supple, no carotid bruits are noted.  Respiratory: The respiratory examination is clear.  Cardiovascular: The cardiovascular examination reveals a regular rate and rhythm, no obvious murmurs or rubs are noted.  Skin: Extremities are without significant edema.  Neurologic Exam  Mental status: The patient is alert and oriented x 3 at the time of the examination. The patient has apparent normal recent and remote memory, with an apparently normal attention span and concentration ability.  Cranial nerves: Facial symmetry is present. There is good sensation of the face to pinprick and soft touch bilaterally. The strength of the facial muscles and the muscles to head turning and shoulder shrug are normal bilaterally. Speech is well enunciated, no aphasia or dysarthria is noted. Extraocular movements are full on the left, the patient has incomplete adduction of the right eye, on primary gaze the right eye is superior and lateral to mid position. Visual fields are full. The tongue is midline, and the patient has symmetric elevation of the soft palate. No obvious hearing deficits are noted.  Motor: The motor testing reveals 5 over 5 strength of the upper extremities.  With the arms outstretched for 1 minute, there is slight fatigable weakness of the deltoid muscles bilaterally, left greater than right.  The patient has significant weakness with hip flexion bilaterally and with extension of the knees bilaterally.  Good symmetric motor tone is noted throughout.  Sensory: Sensory testing is intact to pinprick, soft touch and vibration sensation on all 4 extremities. No evidence of extinction is noted.  Coordination:  Cerebellar testing reveals good finger-nose-finger bilaterally.  The patient is unable to perform heel-to-shin on either side.  Gait and station: Gait could not be tested, the patient is unable to bear weight on the legs, she is wheelchair-bound.  Reflexes: Deep tendon reflexes are symmetric, but are depressed bilaterally. Toes are downgoing bilaterally.   Assessment/Plan:  1.  Myasthenia gravis  The patient has had symptoms for greater than 1 year with double vision and ptosis of the eyes.  The patient has some fatigable weakness of the upper extremities as well.  The patient will be placed on prednisone 20 mg daily, she will be placed on Mestinon taking 30 mg 4 times daily.  She will follow-up in 3 months, we may add Imuran or CellCept at that time.  The patient will be set up for a CT scan of the chest, we will not give contrast given her chronic renal insufficiency.  She will contact our office if she is not tolerating the medications.  Jill Alexanders MD 12/09/2016 11:19 AM  [facility] [address] [place],  [address]  Phone [phone] Fax [phone]

## 2016-12-09 NOTE — Patient Instructions (Signed)
   We will start prednisone 20 mg a day and mestinon 60 mg 1/2 tablet 4 times a day.  We will get a CT of the chest.

## 2016-12-22 ENCOUNTER — Ambulatory Visit
Admission: RE | Admit: 2016-12-22 | Discharge: 2016-12-22 | Disposition: A | Discharge status: Home / Self Care | Payer: Medicare Other | Source: Ambulatory Visit | Attending: Neurology | Admitting: Neurology

## 2016-12-22 DIAGNOSIS — F40240 Claustrophobia: Secondary | ICD-10-CM

## 2016-12-22 DIAGNOSIS — G7000 Myasthenia gravis without (acute) exacerbation: Secondary | ICD-10-CM

## 2016-12-22 DIAGNOSIS — H4921 Sixth [abducent] nerve palsy, right eye: Secondary | ICD-10-CM

## 2016-12-22 DIAGNOSIS — Z79899 Other long term (current) drug therapy: Secondary | ICD-10-CM

## 2016-12-22 DIAGNOSIS — R404 Transient alteration of awareness: Secondary | ICD-10-CM

## 2016-12-22 DIAGNOSIS — H02401 Unspecified ptosis of right eyelid: Secondary | ICD-10-CM

## 2016-12-22 DIAGNOSIS — T50905A Adverse effect of unspecified drugs, medicaments and biological substances, initial encounter: Secondary | ICD-10-CM

## 2016-12-22 DIAGNOSIS — H532 Diplopia: Secondary | ICD-10-CM

## 2016-12-23 ENCOUNTER — Telehealth: Payer: Self-pay | Admitting: Neurology

## 2016-12-23 NOTE — Telephone Encounter (Signed)
I called the patient, talk with her nurse, CT of the chest is relatively unremarkable, there is no evidence of a thymoma, small adrenal gland adenoma noted on the right.  CT chest 12/23/16:  IMPRESSION: 1. No acute cardiopulmonary abnormalities. 2. No mediastinal mass identified. 3. Right adrenal gland adenoma 4. Aortic Atherosclerosis (ICD10-I70.0). Three vessel coronary artery calcifications noted.

## 2017-03-13 ENCOUNTER — Ambulatory Visit (INDEPENDENT_AMBULATORY_CARE_PROVIDER_SITE_OTHER): Payer: Medicare Other | Admitting: Neurology

## 2017-03-13 ENCOUNTER — Encounter: Payer: Self-pay | Admitting: Neurology

## 2017-03-13 ENCOUNTER — Encounter (INDEPENDENT_AMBULATORY_CARE_PROVIDER_SITE_OTHER): Payer: Self-pay

## 2017-03-13 VITALS — BP 137/80 | HR 63 | Ht 64.0 in

## 2017-03-13 DIAGNOSIS — G7000 Myasthenia gravis without (acute) exacerbation: Secondary | ICD-10-CM | POA: Diagnosis not present

## 2017-03-13 DIAGNOSIS — Z5181 Encounter for therapeutic drug level monitoring: Secondary | ICD-10-CM | POA: Diagnosis not present

## 2017-03-13 MED ORDER — PYRIDOSTIGMINE BROMIDE 60 MG PO TABS: 30.0000 mg | ORAL_TABLET | Freq: Three times a day (TID) | ORAL | Status: AC

## 2017-03-13 MED ORDER — AZATHIOPRINE 50 MG PO TABS: 100.0000 mg | ORAL_TABLET | Freq: Every day | ORAL | 3 refills | Status: AC

## 2017-03-13 NOTE — Patient Instructions (Signed)
   We will cut back on the mestinon 60 mg tablet taking 1/2 tablet three times a day.  We will start Imuran 50 mg taking 100 mg daily.

## 2017-03-13 NOTE — Progress Notes (Signed)
Reason for visit: Myasthenia gravis  Jennifer [name] is an 82 y.o. female  History of present illness:  Jennifer [name] is a 81 year old left-handed black female with a history of myasthenia gravis.  She has been placed on prednisone 20 mg daily and she is on Mestinon taking 60 mg, 1/2 tablet 4 times daily.  The patient has gained some benefit with medication, she has had some improvement in her double vision and the ptosis she has noted on the eyes as gotten better, but still occurs occasionally on the right.  The patient may have some twitching in the left eye lid.  The patient denies any significant troubles with swallowing or choking.  She still has some fatigable weakness of the arms.  She does not walk, she is wheelchair-bound.  She returns to this office for an evaluation.  She currently is living in [facility].  Past Medical History:  Diagnosis Date  . Anemia   . Anxiety   . Arthritis   . Atypical chest pain    a. 2006 Cath: nl cors;  b. 05/2010 MV: no ischemia;  c. 05/2013 MV: nl EF, no ischemia. d. 06/2016: NST showing no ischemia or prior infarction  . Blood transfusion   . Cardiomyopathy, nonischemic ([place])    a. 2006 EF initially 30%;  b. 10/2009 Echo EF now 50-55%; c. 11/2012 Echo: EF 45-50% w/ septal motion abnormality (LBBB), gr1 DD, mod TR, PASP 102mmHg; d. 05/2013 MV: Nl EF by SPECT.  . Constipation due to pain medication   . Dementia   . Depression   . Diarrhea   . Frequency of urination    at night  . GERD (gastroesophageal reflux disease)   . Glaucoma   . Hypertension   . LBBB (left bundle branch block)   . Myasthenia gravis ([place]) 12/09/2016  . Osteoporosis 04/13/2006  . Paget's disease of bone 11/29/2012  . Psychosis ([place])   . Renal disorder    only has one kidney; Right kidney stopped working after last child was born  . Seizures ([place])    approximately 20 years since last seizure  . Swelling of both ankles    Takes Lasix if needed  . Thyroid disease 04/13/2006    hypothyroidism  . TIA (transient ischemic attack)     Past Surgical History:  Procedure Laterality Date  . BREAST LUMPECTOMY Left    x 2  . CARDIAC CATHETERIZATION    . CHOLECYSTECTOMY    . ESOPHAGOGASTRODUODENOSCOPY N/A 11/30/2012   Procedure: ESOPHAGOGASTRODUODENOSCOPY (EGD);  Surgeon: Wonda Horner, MD;  Location: [facility] ENDOSCOPY;  Service: Endoscopy;  Laterality: N/A;  . EYE SURGERY     cataract removal pt unsure which eye  . FOOT SURGERY Left   . JOINT REPLACEMENT Bilateral    knees  . NEPHRECTOMY  1963  . PATELLAR TENDON REPAIR Right 07/12/2013   Procedure: PRIMARY LEFT PATELLA TENDON REPAIR;  Surgeon: Kerin Salen, MD;  Location: [place];  Service: Orthopedics;  Laterality: Right;  . SHOULDER SURGERY Right   . TONSILLECTOMY    . TOTAL KNEE REVISION Right 06/19/2013   Procedure: RIGHT TOTAL KNEE REVISION;  Surgeon: Kerin Salen, MD;  Location: [place];  Service: Orthopedics;  Laterality: Right;  . TUBAL LIGATION      Family History  Problem Relation Age of Onset  . Stomach cancer Mother   . Diabetes Brother   . Heart attack Brother     Social history:  reports that  has never smoked. Her smokeless tobacco use includes snuff. She reports that she does not drink alcohol or use drugs.    Allergies  Allergen Reactions  . Codeine Nausea Only    Medications:  Prior to Admission medications   Medication Sig Start Date End Date Taking? Authorizing Provider  alum & mag hydroxide-simeth ([place]) 200-200-20 MG/5ML suspension Take 30 mLs by mouth every 6 (six) hours as needed for indigestion or heartburn.   Yes [provider]  ARIPiprazole (ABILIFY) 2 MG tablet Take 2 mg by mouth daily.   Yes [provider]  aspirin 81 MG chewable tablet Chew 81 mg by mouth daily.   Yes [provider]  buPROPion (WELLBUTRIN XL) 150 MG 24 hr tablet Take 150 mg by mouth every morning.   Yes [provider]  carboxymethylcellulose (REFRESH TEARS) 0.5 % SOLN  Place 1 drop into both eyes 4 (four) times daily.   Yes [provider]  carvedilol (COREG) 12.5 MG tablet Take 12.5 mg by mouth 2 (two) times daily with a meal.   Yes [provider]  Cholecalciferol (VITAMIN D3 PO) Take 1 tablet by mouth daily.   Yes [provider]  cycloSPORINE (RESTASIS) 0.05 % ophthalmic emulsion Place 1 drop into both eyes 2 (two) times daily.   Yes [provider]  diclofenac sodium (VOLTAREN) 1 % GEL Apply 2 g topically 4 (four) times daily. Rub into affected area of foot 2 to 4 times daily Patient taking differently: Apply 2 g topically 2 (two) times daily. TO AFFECTED GREAT TOE 03/10/16  Yes Trula Slade, DPM  dorzolamidel-timolol (COSOPT PF) 22.3-6.8 MG/ML SOLN ophthalmic solution Place 1 drop into both eyes 2 (two) times daily.   Yes [provider]  fentaNYL (DURAGESIC - DOSED MCG/HR) 50 MCG/HR Place 50 mcg onto the skin every 3 (three) days. REMOVE OLD [facility] FIRST   Yes [provider]  ferrous sulfate 325 (65 FE) MG tablet Take 325 mg by mouth every Monday, Wednesday, and Friday.    Yes [provider]  furosemide (LASIX) 20 MG tablet Take 20 mg by mouth daily.   Yes [provider]  gabapentin (NEURONTIN) 300 MG capsule Take 1 capsule by mouth 2 (two) times daily. 05/05/16  Yes [provider]  HYDROcodone-acetaminophen (NORCO/VICODIN) 5-325 MG tablet Take 1 tablet by mouth every 8 (eight) hours as needed for moderate pain.   Yes [provider]  isosorbide mononitrate (IMDUR) 30 MG 24 hr tablet Take 30 mg by mouth daily. 05/23/16  Yes [provider]  LORazepam (ATIVAN) 0.5 MG tablet Take 0.5-1 mg by mouth See admin instructions. 0.5 mg in the morning and 1 mg at bedtime   Yes [provider]  losartan (COZAAR) 25 MG tablet Take 1 tablet by mouth 2 (two) times daily. 05/10/16  Yes [provider]  magnesium hydroxide (MILK OF MAGNESIA) 400 MG/5ML  suspension Take 30 mLs by mouth at bedtime as needed for mild constipation.    Yes [provider]  Melatonin 3 MG TABS Take 3 mg by mouth at bedtime.   Yes [provider]  Multiple Vitamins-Minerals (CEROVITE ADVANCED FORMULA PO) Take 1 tablet by mouth daily.    Yes [provider]  NONFORMULARY OR COMPOUNDED ITEM [facility]:  "Authorized Substitute" Pain Cream Formulation - Ibuprofen 15%, Gaclofen 1%, Gabapentin 3%, Lidocaine 2%, apply 2 grams to affected area 3 times daily. 03/16/16  Yes Trula Slade, DPM  pantoprazole (PROTONIX) 40  MG tablet Take 40 mg by mouth daily.   Yes [provider]  polyethylene glycol (MIRALAX / GLYCOLAX) packet Take 17 g by mouth daily as needed for severe constipation. MIXED INTO 4-8 OUNCES OF LIQUID   Yes [provider]  predniSONE (DELTASONE) 20 MG tablet Take 1 tablet (20 mg total) by mouth daily. 12/09/16  Yes Kathrynn Ducking, MD  pyridostigmine (MESTINON) 60 MG tablet Take 0.5 tablets (30 mg total) by mouth 3 (three) times daily. 03/13/17  Yes Kathrynn Ducking, MD  traZODone (DESYREL) 50 MG tablet Take 25 mg by mouth at bedtime.   Yes [provider]  vitamin B-12 (CYANOCOBALAMIN) 100 MCG tablet Take 100 mcg by mouth daily.   Yes [provider]  acetaminophen (TYLENOL) 500 MG tablet Take 500 mg by mouth every 4 (four) hours as needed for mild pain or headache (or fever of 99.5-101 F).     [provider]  azaTHIOprine (IMURAN) 50 MG tablet Take 2 tablets (100 mg total) by mouth daily. 03/13/17   Kathrynn Ducking, MD  cloNIDine (CATAPRES) 0.1 MG tablet Take 0.1 mg by mouth 2 (two) times daily.    [provider]  furosemide (LASIX) 20 MG tablet Take 1 tablet (20 mg total) by mouth daily. 08/01/16 10/30/16  Erma Heritage, PA-C    ROS:  Out of a complete 14 system review of symptoms, the patient complains only of the following symptoms, and all other reviewed  systems are negative.  Double vision Weakness  Blood pressure 137/80, pulse 63, height 5\' 4"  (1.626 m), SpO2 95 %.  Physical Exam  General: The patient is alert and cooperative at the time of the examination.  The patient is moderately obese.  Skin: No significant peripheral edema is noted.  The patient has physical restriction of movement of the legs at the knees, can only extend the knees to around 100 degrees.   Neurologic Exam  Mental status: The patient is alert and oriented x 3 at the time of the examination. The patient has apparent normal recent and remote memory, with an apparently normal attention span and concentration ability.   Cranial nerves: Facial symmetry is present. Speech is normal, no aphasia or dysarthria is noted. Extraocular movements are full. Visual fields are full.  With superior gaze for 1 minute, there is slight divergence of gaze, exotropia on the left.  The patient does not report double vision.  No ptosis is seen after superior gaze for 1 minute.  Motor: The patient has good strength in all 4 extremities.  With arms outstretched for 1 minute, some fatigue weakness of both arms is noted.  Sensory examination: Soft touch sensation is symmetric on the face, arms, and legs.  Coordination: The patient has good finger-nose-finger bilaterally.  Gait and station: The patient is unable to ambulate, she is wheelchair-bound.  Reflexes: Deep tendon reflexes are symmetric, but are depressed.   Assessment/Plan:  1.  Myasthenia gravis  2.  Gait disturbance  The patient is having diarrhea on the Mestinon, we will cut back on the dose taking 30 mg 3 times daily.  Imuran will be added at 100 mg daily, hopefully we will be able to reduce the dose of prednisone when she is seen here next.  She will have blood work done today.  She will follow-up in 4 months.  Jill Alexanders MD 03/13/2017 3:01 PM  [facility] [address] [place], [place] [address]  Phone  [phone] Fax [phone]

## 2017-03-14 LAB — CBC WITH DIFFERENTIAL/PLATELET
BASOS: 0 %
Basophils Absolute: 0 10*3/uL (ref 0.0–0.2)
EOS (ABSOLUTE): 0 10*3/uL (ref 0.0–0.4)
Eos: 0 %
HEMOGLOBIN: 11.5 g/dL (ref 11.1–15.9)
Hematocrit: 33.7 % — ABNORMAL LOW (ref 34.0–46.6)
IMMATURE GRANS (ABS): 0.2 10*3/uL — AB (ref 0.0–0.1)
Immature Granulocytes: 2 %
LYMPHS ABS: 1.1 10*3/uL (ref 0.7–3.1)
LYMPHS: 9 %
MCH: 32.6 pg (ref 26.6–33.0)
MCHC: 34.1 g/dL (ref 31.5–35.7)
MCV: 96 fL (ref 79–97)
Monocytes Absolute: 0.3 10*3/uL (ref 0.1–0.9)
Monocytes: 2 %
NEUTROS ABS: 10.9 10*3/uL — AB (ref 1.4–7.0)
Neutrophils: 87 %
PLATELETS: 279 10*3/uL (ref 150–379)
RBC: 3.53 x10E6/uL — ABNORMAL LOW (ref 3.77–5.28)
RDW: 14.8 % (ref 12.3–15.4)
WBC: 12.5 10*3/uL — ABNORMAL HIGH (ref 3.4–10.8)

## 2017-03-14 LAB — COMPREHENSIVE METABOLIC PANEL
A/G RATIO: 1.4 (ref 1.2–2.2)
ALBUMIN: 3.9 g/dL (ref 3.5–4.8)
ALT: 32 IU/L (ref 0–32)
AST: 25 IU/L (ref 0–40)
Alkaline Phosphatase: 81 IU/L (ref 39–117)
BUN / CREAT RATIO: 25 (ref 12–28)
BUN: 38 mg/dL — ABNORMAL HIGH (ref 8–27)
Bilirubin Total: 0.3 mg/dL (ref 0.0–1.2)
CALCIUM: 9.4 mg/dL (ref 8.7–10.3)
CO2: 23 mmol/L (ref 20–29)
Chloride: 103 mmol/L (ref 96–106)
Creatinine, Ser: 1.52 mg/dL — ABNORMAL HIGH (ref 0.57–1.00)
GFR, EST AFRICAN AMERICAN: 37 mL/min/{1.73_m2} — AB (ref >59)
GFR, EST NON AFRICAN AMERICAN: 32 mL/min/{1.73_m2} — AB (ref >59)
GLOBULIN, TOTAL: 2.8 g/dL (ref 1.5–4.5)
Glucose: 113 mg/dL — ABNORMAL HIGH (ref 65–99)
POTASSIUM: 4.7 mmol/L (ref 3.5–5.2)
SODIUM: 143 mmol/L (ref 134–144)
TOTAL PROTEIN: 6.7 g/dL (ref 6.0–8.5)

## 2017-06-14 ENCOUNTER — Inpatient Hospital Stay (HOSPITAL_COMMUNITY)
Admission: EM | Admit: 2017-06-14 | Discharge: 2017-06-24 | DRG: 091 | Disposition: A | Discharge status: Skilled Nursing Facility | Payer: Medicare Other | Attending: Internal Medicine | Admitting: Internal Medicine

## 2017-06-14 ENCOUNTER — Emergency Department (HOSPITAL_COMMUNITY): Payer: Medicare Other

## 2017-06-14 ENCOUNTER — Encounter (HOSPITAL_COMMUNITY): Payer: Self-pay | Admitting: Pharmacy Technician

## 2017-06-14 DIAGNOSIS — F17290 Nicotine dependence, other tobacco product, uncomplicated: Secondary | ICD-10-CM | POA: Diagnosis present

## 2017-06-14 DIAGNOSIS — R0902 Hypoxemia: Secondary | ICD-10-CM | POA: Diagnosis not present

## 2017-06-14 DIAGNOSIS — Z6836 Body mass index (BMI) 36.0-36.9, adult: Secondary | ICD-10-CM

## 2017-06-14 DIAGNOSIS — G9340 Encephalopathy, unspecified: Secondary | ICD-10-CM | POA: Diagnosis present

## 2017-06-14 DIAGNOSIS — F329 Major depressive disorder, single episode, unspecified: Secondary | ICD-10-CM | POA: Diagnosis present

## 2017-06-14 DIAGNOSIS — I428 Other cardiomyopathies: Secondary | ICD-10-CM

## 2017-06-14 DIAGNOSIS — I10 Essential (primary) hypertension: Secondary | ICD-10-CM | POA: Diagnosis present

## 2017-06-14 DIAGNOSIS — Z8673 Personal history of transient ischemic attack (TIA), and cerebral infarction without residual deficits: Secondary | ICD-10-CM

## 2017-06-14 DIAGNOSIS — R5383 Other fatigue: Secondary | ICD-10-CM | POA: Diagnosis not present

## 2017-06-14 DIAGNOSIS — L8990 Pressure ulcer of unspecified site, unspecified stage: Secondary | ICD-10-CM

## 2017-06-14 DIAGNOSIS — K219 Gastro-esophageal reflux disease without esophagitis: Secondary | ICD-10-CM | POA: Diagnosis present

## 2017-06-14 DIAGNOSIS — T4275XA Adverse effect of unspecified antiepileptic and sedative-hypnotic drugs, initial encounter: Secondary | ICD-10-CM | POA: Diagnosis present

## 2017-06-14 DIAGNOSIS — Z833 Family history of diabetes mellitus: Secondary | ICD-10-CM

## 2017-06-14 DIAGNOSIS — G894 Chronic pain syndrome: Secondary | ICD-10-CM | POA: Diagnosis present

## 2017-06-14 DIAGNOSIS — I5022 Chronic systolic (congestive) heart failure: Secondary | ICD-10-CM | POA: Diagnosis present

## 2017-06-14 DIAGNOSIS — N183 Chronic kidney disease, stage 3 unspecified: Secondary | ICD-10-CM | POA: Diagnosis present

## 2017-06-14 DIAGNOSIS — I447 Left bundle-branch block, unspecified: Secondary | ICD-10-CM | POA: Diagnosis present

## 2017-06-14 DIAGNOSIS — Z8249 Family history of ischemic heart disease and other diseases of the circulatory system: Secondary | ICD-10-CM

## 2017-06-14 DIAGNOSIS — E876 Hypokalemia: Secondary | ICD-10-CM | POA: Diagnosis not present

## 2017-06-14 DIAGNOSIS — M889 Osteitis deformans of unspecified bone: Secondary | ICD-10-CM | POA: Diagnosis present

## 2017-06-14 DIAGNOSIS — G9341 Metabolic encephalopathy: Secondary | ICD-10-CM | POA: Diagnosis present

## 2017-06-14 DIAGNOSIS — Z9849 Cataract extraction status, unspecified eye: Secondary | ICD-10-CM

## 2017-06-14 DIAGNOSIS — Z9049 Acquired absence of other specified parts of digestive tract: Secondary | ICD-10-CM

## 2017-06-14 DIAGNOSIS — Z885 Allergy status to narcotic agent status: Secondary | ICD-10-CM

## 2017-06-14 DIAGNOSIS — R1310 Dysphagia, unspecified: Secondary | ICD-10-CM | POA: Diagnosis present

## 2017-06-14 DIAGNOSIS — Z905 Acquired absence of kidney: Secondary | ICD-10-CM

## 2017-06-14 DIAGNOSIS — Z7982 Long term (current) use of aspirin: Secondary | ICD-10-CM

## 2017-06-14 DIAGNOSIS — Z79891 Long term (current) use of opiate analgesic: Secondary | ICD-10-CM

## 2017-06-14 DIAGNOSIS — I130 Hypertensive heart and chronic kidney disease with heart failure and stage 1 through stage 4 chronic kidney disease, or unspecified chronic kidney disease: Secondary | ICD-10-CM | POA: Diagnosis present

## 2017-06-14 DIAGNOSIS — F0390 Unspecified dementia without behavioral disturbance: Secondary | ICD-10-CM | POA: Diagnosis present

## 2017-06-14 DIAGNOSIS — R402142 Coma scale, eyes open, spontaneous, at arrival to emergency department: Secondary | ICD-10-CM | POA: Diagnosis present

## 2017-06-14 DIAGNOSIS — F419 Anxiety disorder, unspecified: Secondary | ICD-10-CM | POA: Diagnosis present

## 2017-06-14 DIAGNOSIS — M1990 Unspecified osteoarthritis, unspecified site: Secondary | ICD-10-CM | POA: Diagnosis present

## 2017-06-14 DIAGNOSIS — G92 Toxic encephalopathy: Secondary | ICD-10-CM | POA: Diagnosis not present

## 2017-06-14 DIAGNOSIS — J9601 Acute respiratory failure with hypoxia: Secondary | ICD-10-CM | POA: Diagnosis present

## 2017-06-14 DIAGNOSIS — R402252 Coma scale, best verbal response, oriented, at arrival to emergency department: Secondary | ICD-10-CM | POA: Diagnosis present

## 2017-06-14 DIAGNOSIS — Z7952 Long term (current) use of systemic steroids: Secondary | ICD-10-CM

## 2017-06-14 DIAGNOSIS — Z96653 Presence of artificial knee joint, bilateral: Secondary | ICD-10-CM | POA: Diagnosis present

## 2017-06-14 DIAGNOSIS — R402362 Coma scale, best motor response, obeys commands, at arrival to emergency department: Secondary | ICD-10-CM | POA: Diagnosis present

## 2017-06-14 DIAGNOSIS — Z800 Family history of malignant neoplasm of digestive organs: Secondary | ICD-10-CM

## 2017-06-14 DIAGNOSIS — E785 Hyperlipidemia, unspecified: Secondary | ICD-10-CM | POA: Diagnosis present

## 2017-06-14 DIAGNOSIS — E039 Hypothyroidism, unspecified: Secondary | ICD-10-CM | POA: Diagnosis present

## 2017-06-14 DIAGNOSIS — D510 Vitamin B12 deficiency anemia due to intrinsic factor deficiency: Secondary | ICD-10-CM | POA: Diagnosis present

## 2017-06-14 DIAGNOSIS — Z993 Dependence on wheelchair: Secondary | ICD-10-CM

## 2017-06-14 DIAGNOSIS — H5010 Unspecified exotropia: Secondary | ICD-10-CM | POA: Diagnosis present

## 2017-06-14 DIAGNOSIS — G7000 Myasthenia gravis without (acute) exacerbation: Secondary | ICD-10-CM | POA: Diagnosis present

## 2017-06-14 DIAGNOSIS — H409 Unspecified glaucoma: Secondary | ICD-10-CM | POA: Diagnosis present

## 2017-06-14 DIAGNOSIS — M810 Age-related osteoporosis without current pathological fracture: Secondary | ICD-10-CM | POA: Diagnosis present

## 2017-06-14 LAB — URINALYSIS, ROUTINE W REFLEX MICROSCOPIC
Bilirubin Urine: NEGATIVE
GLUCOSE, UA: NEGATIVE mg/dL
HGB URINE DIPSTICK: NEGATIVE
KETONES UR: NEGATIVE mg/dL
Leukocytes, UA: NEGATIVE
Nitrite: NEGATIVE
PROTEIN: NEGATIVE mg/dL
Specific Gravity, Urine: 1.017 (ref 1.005–1.030)
pH: 5 (ref 5.0–8.0)

## 2017-06-14 LAB — COMPREHENSIVE METABOLIC PANEL
ALBUMIN: 3.3 g/dL — AB (ref 3.5–5.0)
ALK PHOS: 73 U/L (ref 38–126)
ALT: 23 U/L (ref 14–54)
AST: 31 U/L (ref 15–41)
Anion gap: 9 (ref 5–15)
BILIRUBIN TOTAL: 0.9 mg/dL (ref 0.3–1.2)
BUN: 37 mg/dL — AB (ref 6–20)
CALCIUM: 9.2 mg/dL (ref 8.9–10.3)
CO2: 28 mmol/L (ref 22–32)
Chloride: 103 mmol/L (ref 101–111)
Creatinine, Ser: 1.62 mg/dL — ABNORMAL HIGH (ref 0.44–1.00)
GFR calc Af Amer: 34 mL/min — ABNORMAL LOW (ref >60)
GFR, EST NON AFRICAN AMERICAN: 29 mL/min — AB (ref >60)
GLUCOSE: 87 mg/dL (ref 65–99)
Potassium: 4.7 mmol/L (ref 3.5–5.1)
Sodium: 140 mmol/L (ref 135–145)
TOTAL PROTEIN: 6.4 g/dL — AB (ref 6.5–8.1)

## 2017-06-14 LAB — I-STAT TROPONIN, ED: Troponin i, poc: 0.01 ng/mL (ref 0.00–0.08)

## 2017-06-14 LAB — LIPASE, BLOOD: LIPASE: 23 U/L (ref 11–51)

## 2017-06-14 LAB — I-STAT CG4 LACTIC ACID, ED: Lactic Acid, Venous: 1.24 mmol/L (ref 0.5–1.9)

## 2017-06-14 LAB — CBC
HEMATOCRIT: 36.9 % (ref 36.0–46.0)
HEMOGLOBIN: 11.5 g/dL — AB (ref 12.0–15.0)
MCH: 32.4 pg (ref 26.0–34.0)
MCHC: 31.2 g/dL (ref 30.0–36.0)
MCV: 103.9 fL — AB (ref 78.0–100.0)
Platelets: 220 10*3/uL (ref 150–400)
RBC: 3.55 MIL/uL — ABNORMAL LOW (ref 3.87–5.11)
RDW: 14.6 % (ref 11.5–15.5)
WBC: 9.6 10*3/uL (ref 4.0–10.5)

## 2017-06-14 LAB — AMMONIA: Ammonia: 31 umol/L (ref 9–35)

## 2017-06-14 LAB — TSH: TSH: 5.1 u[IU]/mL — ABNORMAL HIGH (ref 0.350–4.500)

## 2017-06-14 MED ORDER — SODIUM CHLORIDE 0.9 % IV BOLUS
1000.0000 mL | Freq: Once | INTRAVENOUS | Status: AC
  Administered 2017-06-14: 1000 mL via INTRAVENOUS

## 2017-06-14 NOTE — Progress Notes (Signed)
NIF ordered for patient.  Performed three times, each with good effort.  Best value achieved was -32.  MD aware.  Tolerated well.

## 2017-06-14 NOTE — ED Notes (Signed)
IV team again at bedsdie

## 2017-06-14 NOTE — Consult Note (Signed)
Neurology Consultation  Reason for Consult: stroke Referring Physician: Dr Sherry Ruffing  CC: Code stroke  History is obtained from: Patient, Chart  HPI: Jennifer [name] is a 82 y.o. female PMH of MG, depression, dementia, HTN, non-ischemic cardiomyopathy, TIA in past, currently on Mestinon 30q8h, Prednisone 20 daily, and Imuran 100 daily, who has been brought in to the hospital from [facility] when she was noted to have decreased level of consciousness, hypotension and was hypoxic with O2 sats in the low 80s. She is at baseline not on Oxygen supplememtation. She denies any preceding illnesses other than occasional diarrhea and chest pain. There was no family or attendant at bedside to provide history. She did not appear to be an entirely reliable historian.  There is no report of her having been sick prior to being brought in today for the lethargy hypotension and hypoxia.  In ER: she was hypoxic on presenation that improved with 2L O2 supplementation. She is more awake. She got labs drawn. She is being admitted to Hospitalist service for further management.  Neurological consulation was obtained for concern for MG exacerbation for this new O2 requirement.   Complains of occasional chest pain. Also complains of diarrhea.  ROS: ROS was performed and is negative except as noted in the HPI.  Past Medical History:  Diagnosis Date  . Anemia   . Anxiety   . Arthritis   . Atypical chest pain    a. 2006 Cath: nl cors;  b. 05/2010 MV: no ischemia;  c. 05/2013 MV: nl EF, no ischemia. d. 06/2016: NST showing no ischemia or prior infarction  . Blood transfusion   . Cardiomyopathy, nonischemic ([place])    a. 2006 EF initially 30%;  b. 10/2009 Echo EF now 50-55%; c. 11/2012 Echo: EF 45-50% w/ septal motion abnormality (LBBB), gr1 DD, mod TR, PASP 73mmHg; d. 05/2013 MV: Nl EF by SPECT.  . Constipation due to pain medication   . Dementia   . Depression   . Diarrhea   . Frequency of urination    at night   . GERD (gastroesophageal reflux disease)   . Glaucoma   . Hypertension   . LBBB (left bundle branch block)   . Myasthenia gravis ([place]) 12/09/2016  . Osteoporosis 04/13/2006  . Paget's disease of bone 11/29/2012  . Psychosis ([place])   . Renal disorder    only has one kidney; Right kidney stopped working after last child was born  . Seizures ([place])    approximately 20 years since last seizure  . Swelling of both ankles    Takes Lasix if needed  . Thyroid disease 04/13/2006   hypothyroidism  . TIA (transient ischemic attack)    Family History  Problem Relation Age of Onset  . Stomach cancer Mother   . Diabetes Brother   . Heart attack Brother    Social History:   reports that she has never smoked. Her smokeless tobacco use includes snuff. She reports that she does not drink alcohol or use drugs.  Medications No current facility-administered medications for this encounter.   Current Outpatient Medications:  .  alum & mag hydroxide-simeth (MINTOX) 244-010-27 MG/5ML suspension, Take 30 mLs by mouth every 6 (six) hours as needed for indigestion or heartburn. AND CANNOT EXCEED 4 DOSES IN 24 HOURS, Disp: , Rfl:  .  ARIPiprazole (ABILIFY) 2 MG tablet, Take 2 mg by mouth daily., Disp: , Rfl:  .  aspirin 81 MG chewable tablet, Chew 81 mg by mouth daily., Disp: ,  Rfl:  .  azaTHIOprine (IMURAN) 50 MG tablet, Take 2 tablets (100 mg total) by mouth daily., Disp: 60 tablet, Rfl: 3 .  buPROPion (WELLBUTRIN SR) 150 MG 12 hr tablet, Take 150 mg by mouth daily., Disp: , Rfl:  .  Carboxymethylcellulose Sodium (REFRESH LIQUIGEL OP), Place 1 drop into both eyes 2 (two) times daily as needed (for dryness)., Disp: , Rfl:  .  carvedilol (COREG) 12.5 MG tablet, Take 12.5 mg by mouth 2 (two) times daily with a meal. HOLD FOR SYSTOLIC B/P <062 or HR <37, Disp: , Rfl:  .  Cholecalciferol (VITAMIN D3) 2000 units TABS, Take 2,000 Units by mouth daily., Disp: , Rfl:  .  diclofenac sodium (VOLTAREN) 1 % GEL,  Apply 2 g topically 4 (four) times daily. Rub into affected area of foot 2 to 4 times daily (Patient taking differently: Apply 2 g topically See admin instructions. Apply 2 grams topically to both feet two times a day for pain), Disp: 100 g, Rfl: 2 .  dorzolamidel-timolol (COSOPT PF) 22.3-6.8 MG/ML SOLN ophthalmic solution, Place 1 drop into both eyes 2 (two) times daily., Disp: , Rfl:  .  fentaNYL (DURAGESIC - DOSED MCG/HR) 50 MCG/HR, Place 50 mcg onto the skin every 3 (three) days. REMOVE OLD PATCH FIRST, Disp: , Rfl:  .  ferrous sulfate 325 (65 FE) MG tablet, Take 325 mg by mouth every Monday, Wednesday, and Friday. , Disp: , Rfl:  .  furosemide (LASIX) 20 MG tablet, Take 1 tablet (20 mg total) by mouth daily. (Patient taking differently: Take 20 mg by mouth daily. HOLD FOR SYSTOLIC B/P <628), Disp: 90 tablet, Rfl: 3 .  gabapentin (NEURONTIN) 300 MG capsule, Take 1 capsule by mouth 2 (two) times daily., Disp: , Rfl:  .  HYDROcodone-acetaminophen (NORCO/VICODIN) 5-325 MG tablet, Take 1 tablet by mouth every 8 (eight) hours as needed (for breakthrough pain). , Disp: , Rfl:  .  isosorbide dinitrate (ISORDIL) 30 MG tablet, Take 30 mg by mouth 4 (four) times daily. HOLD FOR SYSTOLIC B/P <315, Disp: , Rfl:  .  LORazepam (ATIVAN) 0.5 MG tablet, Take 0.5-1 mg by mouth See admin instructions. Take 0.5 mg by mouth in the morning and 1 mg at bedtime, Disp: , Rfl:  .  losartan (COZAAR) 25 MG tablet, Take 1 tablet by mouth 2 (two) times daily. HOLD FOR SYSTOLIC B/P <176, Disp: , Rfl:  .  Melatonin 3 MG TABS, Take 3 mg by mouth at bedtime., Disp: , Rfl:  .  Multiple Vitamins-Minerals (CERTAVITE/ANTIOXIDANTS) TABS, Take 1 tablet by mouth daily., Disp: , Rfl:  .  pantoprazole (PROTONIX) 20 MG tablet, Take 20 mg by mouth daily., Disp: , Rfl:  .  polyethylene glycol (MIRALAX / GLYCOLAX) packet, Take 17 g by mouth daily as needed (FOR CONSTIPATION). MIXED INTO 4-8 OUNCES OF LIQUID, Disp: , Rfl:  .  predniSONE  (DELTASONE) 20 MG tablet, Take 1 tablet (20 mg total) by mouth daily., Disp: 90 tablet, Rfl: 3 .  pyridostigmine (MESTINON) 60 MG tablet, Take 0.5 tablets (30 mg total) by mouth 3 (three) times daily., Disp: , Rfl:  .  traZODone (DESYREL) 50 MG tablet, Take 25 mg by mouth at bedtime., Disp: , Rfl:  .  vitamin B-12 (CYANOCOBALAMIN) 100 MCG tablet, Take 50 mcg by mouth daily. , Disp: , Rfl:  .  acetaminophen (TYLENOL) 500 MG tablet, Take 500 mg by mouth every 4 (four) hours as needed for mild pain or headache (or fever of 99.5-101 F). ,  Disp: , Rfl:  .  cloNIDine (CATAPRES) 0.1 MG tablet, Take 0.1 mg by mouth 2 (two) times daily., Disp: , Rfl:  .  cycloSPORINE (RESTASIS) 0.05 % ophthalmic emulsion, Place 1 drop into both eyes 2 (two) times daily., Disp: , Rfl:  .  NONFORMULARY OR COMPOUNDED ITEM, [facility]:  "Authorized Substitute" Pain Cream Formulation - Ibuprofen 15%, Gaclofen 1%, Gabapentin 3%, Lidocaine 2%, apply 2 grams to affected area 3 times daily. (Patient not taking: Reported on 06/14/2017), Disp: 120 each, Rfl: 2  Exam: Current vital signs: BP (!) 103/56   Pulse 68   Temp (S) 97.7 F (36.5 C) (Rectal)   Resp 10   SpO2 100%  Vital signs in last 24 hours: Temp:  [97 F (36.1 C)-98.4 F (36.9 C)] 97.7 F (36.5 C) (05/01 2040) Pulse Rate:  [64-99] 68 (05/01 2200) Resp:  [10-20] 10 (05/01 2200) BP: (88-131)/(52-79) 103/56 (05/01 2200) SpO2:  [91 %-100 %] 100 % (05/01 2200) Gen: elderly, obese woman, in NAD HEENT: [place] AT dry mucus membranes CVS: S1S2+. rrr CVS: CTABL Abd: obese, non tender Neurological Awake, alert, oriented to self and place (hospital) Reduced attention and concentration CN: PERRL, EOM disconjugate with left exotropia. No diplopia. No obvious ptosis. Face symmetric, auditory acuity intact. Palate midline elevation. Shoulder shrug intact, tongue midline. Motor: nearly symmetric 4+/5 RUE and LUE with some fatigability. Nearly symmetric 4/5 RLE and LLE. Able  to recite numbers up to 11 in one breath. Sensory: intact to LT all over Coordination: intact FNF Gait testing was deferred at this time. She is per report wheelchair bound at baseline.  NIF exam limited by cooperation , best value recorded -32  Labs I have reviewed labs in epic and the results pertinent to this consultation are:  CBC    Component Value Date/Time   WBC 9.6 06/14/2017 1450   RBC 3.55 (L) 06/14/2017 1450   HGB 11.5 (L) 06/14/2017 1450   HGB 11.5 03/13/2017 1517   HCT 36.9 06/14/2017 1450   HCT 33.7 (L) 03/13/2017 1517   PLT 220 06/14/2017 1450   PLT 279 03/13/2017 1517   MCV 103.9 (H) 06/14/2017 1450   MCV 96 03/13/2017 1517   MCH 32.4 06/14/2017 1450   MCHC 31.2 06/14/2017 1450   RDW 14.6 06/14/2017 1450   RDW 14.8 03/13/2017 1517   LYMPHSABS 1.1 03/13/2017 1517   MONOABS 0.4 06/11/2013 1130   EOSABS 0.0 03/13/2017 1517   BASOSABS 0.0 03/13/2017 1517    CMP     Component Value Date/Time   NA 140 06/14/2017 1450   NA 143 03/13/2017 1517   K 4.7 06/14/2017 1450   CL 103 06/14/2017 1450   CO2 28 06/14/2017 1450   GLUCOSE 87 06/14/2017 1450   BUN 37 (H) 06/14/2017 1450   BUN 38 (H) 03/13/2017 1517   CREATININE 1.62 (H) 06/14/2017 1450   CREATININE 1.00 01/23/2014 1140   CALCIUM 9.2 06/14/2017 1450   PROT 6.4 (L) 06/14/2017 1450   PROT 6.7 03/13/2017 1517   ALBUMIN 3.3 (L) 06/14/2017 1450   ALBUMIN 3.9 03/13/2017 1517   AST 31 06/14/2017 1450   ALT 23 06/14/2017 1450   ALKPHOS 73 06/14/2017 1450   BILITOT 0.9 06/14/2017 1450   BILITOT 0.3 03/13/2017 1517   GFRNONAA 29 (L) 06/14/2017 1450   GFRAA 34 (L) 06/14/2017 1450   Imaging CXR: IMPRESSION: Subsegmental atelectasis RIGHT mid lung. No additional abnormalities   I have reviewed the images obtained: CT-scan of the brain 12/22/16 -  No acute changes.  Assessment:  Jennifer [name] is a 82 y.o. female PMH of MG, depression, dementia, HTN, non-ischemic cardiomyopathy, TIA in past, currently on  Mestinon 30q8h, Prednisone 20 daily, who has been brought in to the hospital from [facility] when she was noted to have decreased level of consciousness, hypotension and was hypoxic with O2 sats in the low 80s. She is at baseline not on Oxygen supplememtation. On exam, she does have some geenralized weakness. Her labs do not look overtly concerning for underlying infection (no leukocytosis, no UA abnormalities). She does have some worsening of renal function compared to baseline. Chest xray shows some subsegmental atelectasis in right lung with no additional abnormalities. At this time, I do not think she has an acute MG crisis. She is on multiple medications that can lower her LOC and cause sedation. For now, top of my differentials is toxic metabolic encephalopathy. She should be watched closely for respiratory decompensation, and treatment changes can be initiated if she worsens. For now, no change in her MG meds.  Impression: Toxic metabolic encephalopathy Myasthenia gravis - less likely in crisis Depression, Dementia, Anxiety  Recommendations: -Minimize sedating medications -c/w current MG meds at current doses - Mestinon, Prednison and Imuran -Check NIFs and FVCs q8h. Her last NIF was -32. Intubation is advised if NIF less than -20. -Correct toxic metabolic derangements per primary team -Neurology will continue to follow with you.  -- Amie Portland, MD [facility] Pager: [phone] If 7pm to 7am, please call on call as listed on AMION.

## 2017-06-14 NOTE — ED Provider Notes (Signed)
[facility] EMERGENCY DEPARTMENT Provider Note   CSN: [id number] Arrival date & time: 06/14/17  1420     History   Chief Complaint Chief Complaint  Patient presents with  . Fatigue    HPI Jennifer [name] is a 82 y.o. female.  The history is provided by the patient and medical records.  Near Syncope  This is a new problem. The current episode started 1 to 2 hours ago. The problem occurs constantly. The problem has not changed since onset.Associated symptoms include shortness of breath (resolved with osygen). Pertinent negatives include no chest pain, no abdominal pain and no headaches. Nothing aggravates the symptoms. The symptoms are relieved by lying down. She has tried nothing for the symptoms. The treatment provided no relief.    Past Medical History:  Diagnosis Date  . Anemia   . Anxiety   . Arthritis   . Atypical chest pain    a. 2006 Cath: nl cors;  b. 05/2010 MV: no ischemia;  c. 05/2013 MV: nl EF, no ischemia. d. 06/2016: NST showing no ischemia or prior infarction  . Blood transfusion   . Cardiomyopathy, nonischemic ([place])    a. 2006 EF initially 30%;  b. 10/2009 Echo EF now 50-55%; c. 11/2012 Echo: EF 45-50% w/ septal motion abnormality (LBBB), gr1 DD, mod TR, PASP 88mmHg; d. 05/2013 MV: Nl EF by SPECT.  . Constipation due to pain medication   . Dementia   . Depression   . Diarrhea   . Frequency of urination    at night  . GERD (gastroesophageal reflux disease)   . Glaucoma   . Hypertension   . LBBB (left bundle branch block)   . Myasthenia gravis ([place]) 12/09/2016  . Osteoporosis 04/13/2006  . Paget's disease of bone 11/29/2012  . Psychosis ([place])   . Renal disorder    only has one kidney; Right kidney stopped working after last child was born  . Seizures ([place])    approximately 20 years since last seizure  . Swelling of both ankles    Takes Lasix if needed  . Thyroid disease 04/13/2006   hypothyroidism  . TIA (transient ischemic attack)      Patient Active Problem List   Diagnosis Date Noted  . Myasthenia gravis ([place]) 12/09/2016  . Debilitated patient 06/18/2016  . Chest pain at rest 06/16/2016  . LBBB (left bundle branch block)   . Lower extremity edema 01/25/2014  . Patellar tendon rupture 07/12/2013  . Constipation 07/02/2013  . Knee pain 06/28/2013  . Acute posthemorrhagic anemia 06/28/2013  . Senile dementia, uncomplicated 28/36/6294  . Right Mechanical complication of knee prosthesis 06/19/2013  . 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 Paget's disease of bone 11/29/2012  . Nausea alone 11/29/2012  . GERD (gastroesophageal reflux disease) 11/29/2012  . Nonischemic cardiomyopathy ([place]) 11/29/2012  . Anemia 11/29/2012  . Depression 11/29/2012  . Chest pain 11/28/2012  . Abdominal pain, epigastric 11/28/2012  . UTERINE FIBROID 04/13/2006  . HYPOTHYROIDISM, UNSPECIFIED 04/13/2006  . HYPOPARATHYROIDISM 04/13/2006  . HYPERLIPIDEMIA 04/13/2006  . OBESITY, NOS 04/13/2006  . Pernicious anemia 04/13/2006  . ANXIETY 04/13/2006  . DEPRESSIVE DISORDER, NOS 04/13/2006  . GLAUCOMA 04/13/2006  . Essential hypertension 04/13/2006  . GASTROESOPHAGEAL REFLUX, NO ESOPHAGITIS 04/13/2006  . CKD (chronic kidney disease) stage 3, GFR 30-59 ml/min (HCC) 04/13/2006  . OSTEOARTHRITIS, LOWER LEG 04/13/2006  . ROTATOR CUFF TENDONITIS 04/13/2006  . OSTEOPOROSIS, UNSPECIFIED 04/13/2006  . COSTOCHONDRITIS 04/13/2006  . INSOMNIA NOS 04/13/2006  .  PAIN, GENERALIZED 04/13/2006  . INCONTINENCE, URGE 04/13/2006    Past Surgical History:  Procedure Laterality Date  . BREAST LUMPECTOMY Left    x 2  . CARDIAC CATHETERIZATION    . CHOLECYSTECTOMY    . ESOPHAGOGASTRODUODENOSCOPY N/A 11/30/2012   Procedure: ESOPHAGOGASTRODUODENOSCOPY (EGD);  Surgeon: Wonda Horner, MD;  Location: [facility] ENDOSCOPY;  Service: Endoscopy;  Laterality: N/A;  . EYE SURGERY     cataract removal pt unsure which eye  . FOOT SURGERY Left   .  JOINT REPLACEMENT Bilateral    knees  . NEPHRECTOMY  1963  . PATELLAR TENDON REPAIR Right 07/12/2013   Procedure: PRIMARY LEFT PATELLA TENDON REPAIR;  Surgeon: Kerin Salen, MD;  Location: [facility];  Service: Orthopedics;  Laterality: Right;  . SHOULDER SURGERY Right   . TONSILLECTOMY    . TOTAL KNEE REVISION Right 06/19/2013   Procedure: RIGHT TOTAL KNEE REVISION;  Surgeon: Kerin Salen, MD;  Location: [facility];  Service: Orthopedics;  Laterality: Right;  . TUBAL LIGATION       OB History   None      Home Medications    Prior to Admission medications   Medication Sig Start Date End Date Taking? Authorizing Provider  acetaminophen (TYLENOL) 500 MG tablet Take 500 mg by mouth every 4 (four) hours as needed for mild pain or headache (or fever of 99.5-101 F).     [provider]  alum & mag hydroxide-simeth ([place]) 200-200-20 MG/5ML suspension Take 30 mLs by mouth every 6 (six) hours as needed for indigestion or heartburn.    [provider]  ARIPiprazole (ABILIFY) 2 MG tablet Take 2 mg by mouth daily.    [provider]  aspirin 81 MG chewable tablet Chew 81 mg by mouth daily.    [provider]  azaTHIOprine (IMURAN) 50 MG tablet Take 2 tablets (100 mg total) by mouth daily. 03/13/17   Kathrynn Ducking, MD  buPROPion (WELLBUTRIN XL) 150 MG 24 hr tablet Take 150 mg by mouth every morning.    [provider]  carboxymethylcellulose (REFRESH TEARS) 0.5 % SOLN Place 1 drop into both eyes 4 (four) times daily.    [provider]  carvedilol (COREG) 12.5 MG tablet Take 12.5 mg by mouth 2 (two) times daily with a meal.    [provider]  Cholecalciferol (VITAMIN D3 PO) Take 1 tablet by mouth daily.    [provider]  cloNIDine (CATAPRES) 0.1 MG tablet Take 0.1 mg by mouth 2 (two) times daily.    [provider]  cycloSPORINE (RESTASIS) 0.05 % ophthalmic emulsion Place 1 drop into both eyes 2 (two) times daily.     [provider]  diclofenac sodium (VOLTAREN) 1 % GEL Apply 2 g topically 4 (four) times daily. Rub into affected area of foot 2 to 4 times daily Patient taking differently: Apply 2 g topically 2 (two) times daily. TO AFFECTED GREAT TOE 03/10/16   Trula Slade, DPM  dorzolamidel-timolol (COSOPT PF) 22.3-6.8 MG/ML SOLN ophthalmic solution Place 1 drop into both eyes 2 (two) times daily.    [provider]  fentaNYL (DURAGESIC - DOSED MCG/HR) 50 MCG/HR Place 50 mcg onto the skin every 3 (three) days. REMOVE OLD [facility] FIRST    [provider]  ferrous sulfate 325 (65 FE) MG tablet Take 325 mg by mouth every Monday, Wednesday, and Friday.     [provider]  furosemide (LASIX) 20 MG tablet Take 1 tablet (  20 mg total) by mouth daily. 08/01/16 10/30/16  Strader, Fransisco Hertz, PA-C  furosemide (LASIX) 20 MG tablet Take 20 mg by mouth daily.    [provider]  gabapentin (NEURONTIN) 300 MG capsule Take 1 capsule by mouth 2 (two) times daily. 05/05/16   [provider]  HYDROcodone-acetaminophen (NORCO/VICODIN) 5-325 MG tablet Take 1 tablet by mouth every 8 (eight) hours as needed for moderate pain.    [provider]  isosorbide mononitrate (IMDUR) 30 MG 24 hr tablet Take 30 mg by mouth daily. 05/23/16   [provider]  LORazepam (ATIVAN) 0.5 MG tablet Take 0.5-1 mg by mouth See admin instructions. 0.5 mg in the morning and 1 mg at bedtime    [provider]  losartan (COZAAR) 25 MG tablet Take 1 tablet by mouth 2 (two) times daily. 05/10/16   [provider]  magnesium hydroxide (MILK OF MAGNESIA) 400 MG/5ML suspension Take 30 mLs by mouth at bedtime as needed for mild constipation.     [provider]  Melatonin 3 MG TABS Take 3 mg by mouth at bedtime.    [provider]  Multiple Vitamins-Minerals (CEROVITE ADVANCED FORMULA PO) Take 1 tablet by mouth daily.     [provider]  NONFORMULARY  OR COMPOUNDED ITEM [facility]:  "Authorized Substitute" Pain Cream Formulation - Ibuprofen 15%, Gaclofen 1%, Gabapentin 3%, Lidocaine 2%, apply 2 grams to affected area 3 times daily. 03/16/16   Trula Slade, DPM  pantoprazole (PROTONIX) 40 MG tablet Take 40 mg by mouth daily.    [provider]  polyethylene glycol (MIRALAX / GLYCOLAX) packet Take 17 g by mouth daily as needed for severe constipation. MIXED INTO 4-8 OUNCES OF LIQUID    [provider]  predniSONE (DELTASONE) 20 MG tablet Take 1 tablet (20 mg total) by mouth daily. 12/09/16   Kathrynn Ducking, MD  pyridostigmine (MESTINON) 60 MG tablet Take 0.5 tablets (30 mg total) by mouth 3 (three) times daily. 03/13/17   Kathrynn Ducking, MD  traZODone (DESYREL) 50 MG tablet Take 25 mg by mouth at bedtime.    [provider]  vitamin B-12 (CYANOCOBALAMIN) 100 MCG tablet Take 100 mcg by mouth daily.    [provider]    Family History Family History  Problem Relation Age of Onset  . Stomach cancer Mother   . Diabetes Brother   . Heart attack Brother     Social History Social History   Tobacco Use  . Smoking status: Never Smoker  . Smokeless tobacco: Current User    Types: Snuff  Substance Use Topics  . Alcohol use: No  . Drug use: No     Allergies   Codeine   Review of Systems Review of Systems  Constitutional: Positive for chills and fatigue. Negative for diaphoresis and fever.  HENT: Negative for congestion.   Eyes: Negative for visual disturbance.  Respiratory: Positive for cough and shortness of breath (resolved with osygen). Negative for chest tightness, wheezing and stridor.   Cardiovascular: Positive for near-syncope. Negative for chest pain, palpitations and leg swelling.  Gastrointestinal: Positive for nausea and vomiting. Negative for abdominal pain, constipation and diarrhea.  Genitourinary: Negative for dysuria, flank pain and frequency.  Musculoskeletal:  Negative for back pain, neck pain and neck stiffness.  Skin: Negative for rash.  Neurological: Negative for light-headedness, numbness and headaches.  Psychiatric/Behavioral: Negative for agitation.  All other systems reviewed and are negative.    Physical Exam Updated  Vital Signs There were no vitals taken for this visit.  Physical Exam  Constitutional: She is oriented to person, place, and time. She appears well-developed and well-nourished. No distress.  HENT:  Head: Normocephalic and atraumatic.  Nose: Nose normal.  Mouth/Throat: Oropharynx is clear and moist. No oropharyngeal exudate.  Eyes: Pupils are equal, round, and reactive to light. EOM are normal.  Neck: Neck supple.  Cardiovascular: Normal rate and intact distal pulses.  No murmur heard. Pulmonary/Chest: No respiratory distress. She has rhonchi. She exhibits no tenderness.  Abdominal: Soft. Bowel sounds are normal. She exhibits no distension. There is no tenderness.  Musculoskeletal: Normal range of motion. She exhibits no tenderness.  Neurological: She is alert and oriented to person, place, and time. She is not disoriented. She displays no tremor. No cranial nerve deficit or sensory deficit. She exhibits abnormal muscle tone. GCS eye subscore is 4. GCS verbal subscore is 5. GCS motor subscore is 6.  Symmetric bilateral lower extremity weakness compared to baseline per patient report.  Gait testing deferred due to patient's reported weakness.  Skin: Capillary refill takes less than 2 seconds. No rash noted. She is not diaphoretic. No erythema.  Psychiatric: She has a normal mood and affect.  Nursing note and vitals reviewed.    ED Treatments / Results  Labs (all labs ordered are listed, but only abnormal results are displayed) Labs Reviewed  CBC - Abnormal; Notable for the following components:      Result Value   RBC 3.55 (*)    Hemoglobin 11.5 (*)    MCV 103.9 (*)    All other components within normal  limits  COMPREHENSIVE METABOLIC PANEL - Abnormal; Notable for the following components:   BUN 37 (*)    Creatinine, Ser 1.62 (*)    Total Protein 6.4 (*)    Albumin 3.3 (*)    GFR calc non Af Amer 29 (*)    GFR calc Af Amer 34 (*)    All other components within normal limits  TSH - Abnormal; Notable for the following components:   TSH 5.100 (*)    All other components within normal limits  I-STAT ARTERIAL BLOOD GAS, ED - Abnormal; Notable for the following components:   pH, Arterial 7.348 (*)    pCO2 arterial 48.8 (*)    pO2, Arterial 112.0 (*)    All other components within normal limits  CULTURE, BLOOD (ROUTINE X 2)  CULTURE, BLOOD (ROUTINE X 2)  URINE CULTURE  URINALYSIS, ROUTINE W REFLEX MICROSCOPIC  LIPASE, BLOOD  AMMONIA  BLOOD GAS, ARTERIAL  I-STAT TROPONIN, ED  I-STAT CG4 LACTIC ACID, ED  CBG MONITORING, ED    EKG EKG Interpretation  Date/Time:  Wednesday Jun 14 2017 14:23:58 EDT Ventricular Rate:  64 PR Interval:    QRS Duration: 134 QT Interval:  432 QTC Calculation: 446 R Axis:   17 Text Interpretation:  Sinus rhythm Left bundle branch block No previous tracing Confirmed by Lajean Saver [phone]) on 06/14/2017 2:31:25 PM   Radiology Dg Chest 2 View  Result Date: 06/14/2017 CLINICAL DATA:  Sudden decrease in level of consciousness, hypotension, hypoxic, history myasthenia gravis, non ischemic cardiomyopathy, dementia, hypertension EXAM: CHEST - 2 VIEW COMPARISON:  06/21/2016 FINDINGS: Severely rotated to the RIGHT. Enlargement of cardiac silhouette. Tortuous aorta. Pulmonary vascularity normal. Minimal atelectasis RIGHT mid lung. No gross infiltrate, pleural effusion or pneumothorax. Bones demineralized. IMPRESSION: Subsegmental atelectasis RIGHT mid lung. No additional abnormalities identified on rotated exam. Electronically Signed   By:  Lavonia Dana M.D.   On: 06/14/2017 17:26    Procedures Procedures (including critical care time)  Medications Ordered in  ED Medications  sodium chloride 0.9 % bolus 1,000 mL (0 mLs Intravenous Stopped 06/14/17 1957)     Initial Impression / Assessment and Plan / ED Course  I have reviewed the triage vital signs and the nursing notes.  Pertinent labs & imaging results that were available during my care of the patient were reviewed by me and considered in my medical decision making (see chart for details).     Lesley Atkin Pinkus is a 82 y.o. female with a past medical history significant for hypertension, hyper lipidemia, CKD, seizures, and myasthenia gravis who presents with fatigue, near syncope, chills, dry cough, nausea, vomiting, and EMS report of hypotension and hypoxia.  According to patient, she had some mild nausea and vomiting yesterday that had improved.  She still reports mild nausea but no vomiting today.  She reports had a dry cough for the last few days but no production.  She reports she had some chills but no fevers at home.  She reports she is generalized weakness and fatigue.  She says that she has not had any urinary symptoms to her knowledge and has not had any constipation or diarrhea.  She denies any recent trauma or falls.  She says she got lightheaded and had to lay down but did not fully pass out today.  According to nursing report from EMS, patient was found to have a blood pressure of 80/60 and was having low oxygen saturations of 85% on room air.  Patient does not use oxygen at home normally.  Patient placed on 2 L nasal cannula with improvement.  On my initial evaluation, patient is alert and oriented x3.  Patient had poor air movement and some mild rhonchi in the lungs.  Chest and back are nontender.  Abdomen nontender.  Legs had no significant edema.  Normal sensation throughout to the patient had weakness with leg raising.  She reports is normally able to walk but feels her legs are weaker than normal.  She had normal finger-nose finger testing and normal grip strength bilaterally.  No facial  droop seen.  Pupils are reactive and symmetric.  Patient normal extraocular movements.  Clinically I am concerned patient may have occult infection leading to development of myasthenia crisis given her hypoxia, poor breath sounds, generalized weakness, fatigue, the intermittent hypotension.  Patient was given some fluids initially as well as work-up to look for the occult infection.  Patient will have negative ventilatory force testing by rotatory therapy.  Patient has a new oxygen requirement.  Patient's blood pressure was improved on my initial evaluation.  When compared to prior ECG, no significant changes were seen.  Anticipate admission after work-up is completed.  4:44 PM Respiratory reports the patient's best NIF was -32. This is somewhat concerning as it is close to values we would consider intubation for.   Diagnostic testing showed no evidence of infection in urine or on chest x-ray.  Given patient's oxygen requirement, fatigue, and history of myasthenia gravis, do not feel patient is safe for discharge home.  Neurology was called who will see the patient and she will be admitted for further monitoring of respiratory status.   Final Clinical Impressions(s) / ED Diagnoses   Final diagnoses:  Other fatigue  Hypoxia  Myasthenia gravis (HCC)     Clinical Impression: 1. Other fatigue   2. Hypoxia   3.  Myasthenia gravis ([place])     Disposition: Admit  This note was prepared with assistance of Systems analyst. Occasional wrong-word or sound-a-like substitutions may have occurred due to the inherent limitations of voice recognition software.     Rayyan Burley, Gwenyth Allegra, MD 06/15/17 [phone]

## 2017-06-14 NOTE — ED Notes (Signed)
IV team unable to get iv . Dr. Gustavus Messing aware.

## 2017-06-14 NOTE — ED Notes (Signed)
Dr. Gustavus Messing at bedside and is placing [place] IV>

## 2017-06-14 NOTE — ED Triage Notes (Signed)
Pt BIB EMS from [facility] with reports of sudden decrease in LOC at 1300. Staff found pt to be hypotensive and hypoxic. 80/60 and 85% RA. Pt placed on 2L [place] and saturations increased to 97%. Responsive to verbal. Pt arrives leaning to the R. Pt states she is weak and tired. Pt leaning to the R upon arrival. 108/60, HR 60, 96% 3L [place], CBG 109.

## 2017-06-14 NOTE — ED Notes (Signed)
Dressing change performed. Linen sheet change performed. Peri care performed.

## 2017-06-14 NOTE — ED Notes (Addendum)
IV attempted x2 withput success but labs drawn.

## 2017-06-14 NOTE — ED Notes (Signed)
IV team at bedsdie  

## 2017-06-15 ENCOUNTER — Encounter (HOSPITAL_COMMUNITY): Payer: Self-pay | Admitting: Internal Medicine

## 2017-06-15 ENCOUNTER — Other Ambulatory Visit: Payer: Self-pay

## 2017-06-15 ENCOUNTER — Observation Stay (HOSPITAL_COMMUNITY): Payer: Medicare Other

## 2017-06-15 DIAGNOSIS — G9341 Metabolic encephalopathy: Secondary | ICD-10-CM | POA: Diagnosis present

## 2017-06-15 DIAGNOSIS — G9340 Encephalopathy, unspecified: Secondary | ICD-10-CM | POA: Diagnosis present

## 2017-06-15 DIAGNOSIS — E785 Hyperlipidemia, unspecified: Secondary | ICD-10-CM | POA: Diagnosis present

## 2017-06-15 DIAGNOSIS — I428 Other cardiomyopathies: Secondary | ICD-10-CM

## 2017-06-15 DIAGNOSIS — G7000 Myasthenia gravis without (acute) exacerbation: Secondary | ICD-10-CM | POA: Diagnosis not present

## 2017-06-15 DIAGNOSIS — I10 Essential (primary) hypertension: Secondary | ICD-10-CM | POA: Diagnosis not present

## 2017-06-15 LAB — FERRITIN: FERRITIN: 249 ng/mL (ref 11–307)

## 2017-06-15 LAB — CBC
HEMATOCRIT: 36.7 % (ref 36.0–46.0)
Hemoglobin: 11.4 g/dL — ABNORMAL LOW (ref 12.0–15.0)
MCH: 32.9 pg (ref 26.0–34.0)
MCHC: 31.1 g/dL (ref 30.0–36.0)
MCV: 106.1 fL — AB (ref 78.0–100.0)
Platelets: 176 10*3/uL (ref 150–400)
RBC: 3.46 MIL/uL — ABNORMAL LOW (ref 3.87–5.11)
RDW: 15.3 % (ref 11.5–15.5)
WBC: 7.3 10*3/uL (ref 4.0–10.5)

## 2017-06-15 LAB — BASIC METABOLIC PANEL
Anion gap: 9 (ref 5–15)
BUN: 35 mg/dL — AB (ref 6–20)
CHLORIDE: 108 mmol/L (ref 101–111)
CO2: 25 mmol/L (ref 22–32)
Calcium: 8.8 mg/dL — ABNORMAL LOW (ref 8.9–10.3)
Creatinine, Ser: 1.55 mg/dL — ABNORMAL HIGH (ref 0.44–1.00)
GFR calc Af Amer: 36 mL/min — ABNORMAL LOW (ref >60)
GFR calc non Af Amer: 31 mL/min — ABNORMAL LOW (ref >60)
GLUCOSE: 70 mg/dL (ref 65–99)
POTASSIUM: 4.5 mmol/L (ref 3.5–5.1)
Sodium: 142 mmol/L (ref 135–145)

## 2017-06-15 LAB — I-STAT ARTERIAL BLOOD GAS, ED
Acid-Base Excess: 1 mmol/L (ref 0.0–2.0)
Bicarbonate: 26.8 mmol/L (ref 20.0–28.0)
O2 SAT: 98 %
PCO2 ART: 48.8 mmHg — AB (ref 32.0–48.0)
Patient temperature: 98.6
TCO2: 28 mmol/L (ref 22–32)
pH, Arterial: 7.348 — ABNORMAL LOW (ref 7.350–7.450)
pO2, Arterial: 112 mmHg — ABNORMAL HIGH (ref 83.0–108.0)

## 2017-06-15 LAB — HEPATIC FUNCTION PANEL
ALBUMIN: 2.9 g/dL — AB (ref 3.5–5.0)
ALT: 20 U/L (ref 14–54)
AST: 23 U/L (ref 15–41)
Alkaline Phosphatase: 66 U/L (ref 38–126)
BILIRUBIN TOTAL: 0.7 mg/dL (ref 0.3–1.2)
Bilirubin, Direct: 0.2 mg/dL (ref 0.1–0.5)
Indirect Bilirubin: 0.5 mg/dL (ref 0.3–0.9)
Total Protein: 5.7 g/dL — ABNORMAL LOW (ref 6.5–8.1)

## 2017-06-15 LAB — MRSA PCR SCREENING: MRSA by PCR: NEGATIVE

## 2017-06-15 LAB — IRON AND TIBC
IRON: 35 ug/dL (ref 28–170)
Saturation Ratios: 14 % (ref 10.4–31.8)
TIBC: 258 ug/dL (ref 250–450)
UIBC: 223 ug/dL

## 2017-06-15 LAB — T4, FREE: FREE T4: 1.05 ng/dL (ref 0.82–1.77)

## 2017-06-15 LAB — FOLATE: Folate: 53.7 ng/mL (ref >5.9)

## 2017-06-15 LAB — RETICULOCYTES
RBC.: 3.25 MIL/uL — ABNORMAL LOW (ref 3.87–5.11)
RETIC COUNT ABSOLUTE: 68.3 10*3/uL (ref 19.0–186.0)
RETIC CT PCT: 2.1 % (ref 0.4–3.1)

## 2017-06-15 LAB — D-DIMER, QUANTITATIVE: D-Dimer, Quant: 4.31 ug/mL-FEU — ABNORMAL HIGH (ref 0.00–0.50)

## 2017-06-15 LAB — CBG MONITORING, ED
GLUCOSE-CAPILLARY: 78 mg/dL (ref 65–99)
GLUCOSE-CAPILLARY: 76 mg/dL (ref 65–99)

## 2017-06-15 LAB — AMMONIA: Ammonia: 37 umol/L — ABNORMAL HIGH (ref 9–35)

## 2017-06-15 LAB — VITAMIN B12: VITAMIN B 12: 774 pg/mL (ref 180–914)

## 2017-06-15 MED ORDER — CARVEDILOL 12.5 MG PO TABS
12.5000 mg | ORAL_TABLET | Freq: Two times a day (BID) | ORAL | Status: DC
  Administered 2017-06-15 – 2017-06-16 (×2): 12.5 mg via ORAL
  Filled 2017-06-15 (×2): qty 1

## 2017-06-15 MED ORDER — PANTOPRAZOLE SODIUM 20 MG PO TBEC
20.0000 mg | DELAYED_RELEASE_TABLET | Freq: Every day | ORAL | Status: DC
  Administered 2017-06-15 – 2017-06-18 (×4): 20 mg via ORAL
  Filled 2017-06-15 (×5): qty 1

## 2017-06-15 MED ORDER — ENOXAPARIN SODIUM 40 MG/0.4ML ~~LOC~~ SOLN
40.0000 mg | SUBCUTANEOUS | Status: DC
  Administered 2017-06-16 – 2017-06-24 (×9): 40 mg via SUBCUTANEOUS
  Filled 2017-06-15 (×9): qty 0.4

## 2017-06-15 MED ORDER — FERROUS SULFATE 325 (65 FE) MG PO TABS
325.0000 mg | ORAL_TABLET | ORAL | Status: DC
  Administered 2017-06-16 – 2017-06-23 (×4): 325 mg via ORAL
  Filled 2017-06-15 (×5): qty 1

## 2017-06-15 MED ORDER — ONDANSETRON HCL 4 MG/2ML IJ SOLN
4.0000 mg | Freq: Four times a day (QID) | INTRAMUSCULAR | Status: DC | PRN
  Administered 2017-06-17 – 2017-06-19 (×4): 4 mg via INTRAVENOUS
  Filled 2017-06-15 (×5): qty 2

## 2017-06-15 MED ORDER — FENTANYL 25 MCG/HR TD PT72
50.0000 ug | MEDICATED_PATCH | TRANSDERMAL | Status: DC
  Administered 2017-06-15: 50 ug via TRANSDERMAL
  Filled 2017-06-15: qty 2

## 2017-06-15 MED ORDER — CLONIDINE HCL 0.1 MG PO TABS
0.1000 mg | ORAL_TABLET | Freq: Two times a day (BID) | ORAL | Status: DC
  Administered 2017-06-15 – 2017-06-16 (×3): 0.1 mg via ORAL
  Filled 2017-06-15 (×3): qty 1

## 2017-06-15 MED ORDER — TRAZODONE HCL 50 MG PO TABS
50.0000 mg | ORAL_TABLET | Freq: Once | ORAL | Status: AC
  Administered 2017-06-15: 50 mg via ORAL
  Filled 2017-06-15: qty 1

## 2017-06-15 MED ORDER — ENOXAPARIN SODIUM 40 MG/0.4ML ~~LOC~~ SOLN
40.0000 mg | SUBCUTANEOUS | Status: DC
  Administered 2017-06-15: 40 mg via SUBCUTANEOUS
  Filled 2017-06-15: qty 0.4

## 2017-06-15 MED ORDER — ONDANSETRON HCL 4 MG PO TABS
4.0000 mg | ORAL_TABLET | Freq: Four times a day (QID) | ORAL | Status: DC | PRN
  Administered 2017-06-17: 4 mg via ORAL
  Filled 2017-06-15: qty 1

## 2017-06-15 MED ORDER — ORAL CARE MOUTH RINSE
15.0000 mL | Freq: Two times a day (BID) | OROMUCOSAL | Status: DC
  Administered 2017-06-16 – 2017-06-24 (×10): 15 mL via OROMUCOSAL

## 2017-06-15 MED ORDER — SODIUM CHLORIDE 0.9 % IV BOLUS: 500.0000 mL | Freq: Once | INTRAVENOUS | Status: DC

## 2017-06-15 MED ORDER — ISOSORBIDE DINITRATE 10 MG PO TABS
30.0000 mg | ORAL_TABLET | Freq: Four times a day (QID) | ORAL | Status: DC
  Administered 2017-06-15 – 2017-06-16 (×3): 30 mg via ORAL
  Filled 2017-06-15: qty 1
  Filled 2017-06-15: qty 3
  Filled 2017-06-15 (×2): qty 1
  Filled 2017-06-15: qty 3
  Filled 2017-06-15 (×2): qty 1
  Filled 2017-06-15: qty 3

## 2017-06-15 MED ORDER — POLYETHYLENE GLYCOL 3350 17 G PO PACK: 17.0000 g | PACK | Freq: Every day | ORAL | Status: DC | PRN

## 2017-06-15 MED ORDER — ACETAMINOPHEN 325 MG PO TABS
650.0000 mg | ORAL_TABLET | Freq: Four times a day (QID) | ORAL | Status: DC | PRN
  Administered 2017-06-15 – 2017-06-24 (×8): 650 mg via ORAL
  Filled 2017-06-15 (×10): qty 2

## 2017-06-15 MED ORDER — ASPIRIN 81 MG PO CHEW
81.0000 mg | CHEWABLE_TABLET | Freq: Every day | ORAL | Status: DC
  Administered 2017-06-15 – 2017-06-24 (×10): 81 mg via ORAL
  Filled 2017-06-15 (×10): qty 1

## 2017-06-15 MED ORDER — PYRIDOSTIGMINE BROMIDE 60 MG PO TABS
30.0000 mg | ORAL_TABLET | Freq: Three times a day (TID) | ORAL | Status: DC
  Administered 2017-06-15 – 2017-06-24 (×27): 30 mg via ORAL
  Filled 2017-06-15 (×31): qty 0.5

## 2017-06-15 MED ORDER — HYDRALAZINE HCL 20 MG/ML IJ SOLN
10.0000 mg | INTRAMUSCULAR | Status: DC | PRN
  Administered 2017-06-16 – 2017-06-22 (×6): 10 mg via INTRAVENOUS
  Filled 2017-06-15 (×6): qty 1

## 2017-06-15 MED ORDER — SODIUM CHLORIDE 0.9 % IV SOLN
INTRAVENOUS | Status: AC
  Administered 2017-06-15: 04:00:00 via INTRAVENOUS

## 2017-06-15 MED ORDER — PREDNISONE 20 MG PO TABS
20.0000 mg | ORAL_TABLET | Freq: Every day | ORAL | Status: DC
  Administered 2017-06-15 – 2017-06-24 (×10): 20 mg via ORAL
  Filled 2017-06-15 (×10): qty 1

## 2017-06-15 MED ORDER — DORZOLAMIDE HCL-TIMOLOL MAL 2-0.5 % OP SOLN
1.0000 [drp] | Freq: Two times a day (BID) | OPHTHALMIC | Status: DC
  Administered 2017-06-15 – 2017-06-24 (×18): 1 [drp] via OPHTHALMIC
  Filled 2017-06-15: qty 10

## 2017-06-15 MED ORDER — ARIPIPRAZOLE 2 MG PO TABS
2.0000 mg | ORAL_TABLET | Freq: Every day | ORAL | Status: DC
  Administered 2017-06-15 – 2017-06-24 (×10): 2 mg via ORAL
  Filled 2017-06-15 (×10): qty 1

## 2017-06-15 MED ORDER — AZATHIOPRINE 50 MG PO TABS
100.0000 mg | ORAL_TABLET | Freq: Every day | ORAL | Status: DC
  Administered 2017-06-15 – 2017-06-24 (×10): 100 mg via ORAL
  Filled 2017-06-15 (×10): qty 2

## 2017-06-15 MED ORDER — ACETAMINOPHEN 650 MG RE SUPP: 650.0000 mg | Freq: Four times a day (QID) | RECTAL | Status: DC | PRN

## 2017-06-15 MED ORDER — BUPROPION HCL ER (SR) 150 MG PO TB12
150.0000 mg | ORAL_TABLET | Freq: Every day | ORAL | Status: DC
Start: 2017-06-15 — End: 2017-06-24
  Administered 2017-06-15 – 2017-06-24 (×10): 150 mg via ORAL
  Filled 2017-06-15 (×10): qty 1

## 2017-06-15 MED ORDER — CYCLOSPORINE 0.05 % OP EMUL
1.0000 [drp] | Freq: Two times a day (BID) | OPHTHALMIC | Status: DC
  Administered 2017-06-15 – 2017-06-24 (×19): 1 [drp] via OPHTHALMIC
  Filled 2017-06-15 (×21): qty 1

## 2017-06-15 NOTE — ED Notes (Signed)
Burna Sis, MD at bedside.

## 2017-06-15 NOTE — ED Notes (Signed)
Respiratory at bedside to collect ABG

## 2017-06-15 NOTE — Progress Notes (Signed)
NIF of -22 performed as best effort out of three.  VC of 550 performed as best effort out of three.  Tolerated well.  Going to CT scan at this time.

## 2017-06-15 NOTE — ED Notes (Signed)
Breakfast tray ordered 

## 2017-06-15 NOTE — ED Notes (Signed)
New patches placed on right chest and behind right ear. Old patch removed prior to and wasted with Freida Busman, Therapist, sports.

## 2017-06-15 NOTE — Progress Notes (Addendum)
Subjective: She is on 2 L nasal cannula.  Most recent NIF was -22 with best effort out of 3.  Her vital capacity is 550.  Patient states that she is generally weak.  She is a poor historian about her myasthenia gravis.  She does have a exotropia of her right eye but states that that is not new.  Does state that she has been having some diarrhea.  Exam: Vitals:   06/15/17 0700 06/15/17 0715  BP: 127/60 106/65  Pulse: 69 68  Resp: (!) 9 10  Temp:    SpO2: 100% 100%    Physical Exam   HEENT-  Normocephalic, no lesions, without obvious abnormality.  Normal external eye and conjunctiva.   Cardiovascular-  pulses palpable throughout   Lungs-no rhonchi or wheezing noted, no excessive working breathing.  Saturations within normal limits Abdomen- All 4 quadrants palpated and nontender Extremities- Warm, dry and intact     Neuro:  Mental Status: Alert, oriented to hospital, month but not year-she is able to name objects.  Speech fluent without evidence of aphasia--she has hypophonic and due to aspiratory issues talks very slow.  Able to follow 3 step commands without difficulty. Cranial Nerves: II:  Visual fields grossly normal, --intermittent diplopia per patient III,IV, VI: ptosis present in the right eye--with prolonged upper gaze she does have mild drooping of her eyelids, extra-ocular motions intact bilaterally--as noted she does have a tropia of her right eye but this is not new.  Pupils equal, round, reactive to light and accommodation V,VII: smile symmetric, facial light touch sensation normal bilaterally VIII: hearing normal bilaterally IX,X: uvula rises symmetrically XI: bilateral shoulder shrug XII: midline tongue extension --Head flexion and extension are 4/5 strength Motor: 5/5 with bilateral upper extremities lower extremity --however with a 10-second fist pump of her right arm she clearly has some weakness and decreased down to a 4/5.  She has difficulty even raising her leg  above the bed however with hip flexion it is 5/5. Sensory: Pinprick and light touch intact throughout, bilaterally Deep Tendon Reflexes: Pressed throughout Plantars: Right: downgoing   Left: downgoing Cerebellar: normal finger-to-nose, Gait: Not tested    Medications:  Scheduled: . ARIPiprazole  2 mg Oral Daily  . aspirin  81 mg Oral Daily  . azaTHIOprine  100 mg Oral Daily  . buPROPion  150 mg Oral Daily  . carvedilol  12.5 mg Oral BID WC  . cloNIDine  0.1 mg Oral BID  . cycloSPORINE  1 drop Both Eyes BID  . dorzolamidel-timolol  1 drop Both Eyes BID  . enoxaparin (LOVENOX) injection  40 mg Subcutaneous Q24H  . fentaNYL  50 mcg Transdermal Q72H  . [START ON 06/16/2017] ferrous sulfate  325 mg Oral Q M,W,F  . isosorbide dinitrate  30 mg Oral QID  . pantoprazole  20 mg Oral Daily  . predniSONE  20 mg Oral Daily  . pyridostigmine  30 mg Oral TID   Continuous: . sodium chloride 75 mL/hr at 06/15/17 0405    Pertinent Labs/Diagnostics: NIF -22, vital capacity 550 TSH 5.1 Both blood cultures and urine cultures are pending Urinalysis does not show any nitrates or leukocytes Ammonia within normal limits--repeat ammonia currently active T3 and T4 pending    Dg Chest 2 View  Result Date: 06/14/2017 CLINICAL DATA:  Sudden decrease in level of consciousness, hypotension, hypoxic, history myasthenia gravis, non ischemic cardiomyopathy, dementia, hypertension EXAM: CHEST - 2 VIEW COMPARISON:  06/21/2016 FINDINGS: Severely rotated to the RIGHT. Enlargement  of cardiac silhouette. Tortuous aorta. Pulmonary vascularity normal. Minimal atelectasis RIGHT mid lung. No gross infiltrate, pleural effusion or pneumothorax. Bones demineralized. IMPRESSION: Subsegmental atelectasis RIGHT mid lung. No additional abnormalities identified on rotated exam. Electronically Signed   By: Lavonia Dana M.D.   On: 06/14/2017 17:26     Etta Quill PA-C [facility] [phone]  Impression:  82 year old female who presents with what I suspect to be toxic metabolic encephalopathy secondary to multiple sedating medications.  She does have a history of dementia and still manages her medications.  She continues to have lowness, and given how borderline they are I would favor holding her fentanyl patch for the time being, however if she continues to improve, then we could restart this.  I think that her low nerves are at least in part effort related, and have low suspicion for myasthenic crisis at this time.   Recommendations: 1) continue frequent negative inspiratory force 2) stable overnight, could restart fentanyl tomorrow, if she has signs of withdrawal could start earlier. 3) continue Mestinon 30 3 times daily, prednisone 20 daily, azathioprine 100 mill grams daily 4) neurology will continue to follow   Roland Rack, MD [facility] [phone]  If 7pm- 7am, please page neurology on call as listed in [place].  06/15/2017, 9:46 AM

## 2017-06-15 NOTE — Progress Notes (Signed)
PROGRESS NOTE    Patient: Jennifer [name]     PCP: Janie Morning, DO                    DOB: [birth year]            DOA: 06/14/2017 WJX:[id number]             DOS: 06/15/2017, 7:31 AM   LOS: 0 days   Date of Service: The patient was seen and examined on 06/15/2017  Subjective:   Patient was seen and examined this morning, stable.  Alert oriented X2, NAD  Mentation at baseline.... Weakness in lower extremities  ------------------------------------------------------------------------------------------------------------------------------  Brief Narrative:   Duayne Cal Maeda is a 82 y.o. female PMH of MG, depression, dementia, HTN, non-ischemic cardiomyopathy, TIA in past, currently on Mestinon 30q8h, Prednisone 20 daily, and Imuran 100 daily, who has been brought in to the hospital from [facility] when she was noted to have decreased level of consciousness, hypotension and was hypoxic with O2 sats in the low 80s. She is at baseline not on Oxygen supplememtation. On admission patient was lethargic, hypoxic.  __________________________________________________________________________________ Principal Problem:   Acute encephalopathy Active Problems:   Hypothyroidism   Pernicious anemia   Essential hypertension   CKD (chronic kidney disease) stage 3, GFR 30-59 ml/min (HCC)   Nonischemic cardiomyopathy (HCC)   LBBB (left bundle branch block)   Myasthenia gravis ([place])   Acute metabolic encephalopathy   Hyperlipidemia   Assessment & Plan:   Principal Problem:   Acute toxic metabolic encephalopathy -hypoxia -Much improved, mentation at baseline -Monitoring O2 sat, PCO2 40 -No signs of infection, sepsis present, UA chest x-ray normal - Holding all sedative: Included, trazodone, lorazepam, melatonin and Neurontin for now.  Holding hydrocodone. -CT of the head was reviewed, chronic changes, No acute abnormalities -will F/up Labs including ammonia level    H/o Myasthenia gravis ([place]) -  Neuro. Following  - recommending :Check NIFs and FVCs q8h. Her last NIF was -32. Intubation is advised if NIF less than -20. -c/w current MG meds at current doses - Mestinon, Prednison and Imuran -  Active Problems:    Hypothyroidism -she is not on any med. From home -mildly Elevated TSH; 5.1 - checking Free T4,3  Nonischemic cardiomyopathy  -  EF measured was 45 to 50%.  -  Lasix and Cozaar on hold for now.   - when creatinine stable, anticipating restart Cozaar.  Chronic kidney disease stage III -  creatinine appears to be at baseline.   - cont. Gently hydrating  -  Closely follow respiratory status.  Hypertension  -  on clonidine and Isordil. -  Holding Cozaar for now.  PRN IV hydralazine.  Anemia likely related to renal disease.  -  Since it shows macrocytic picture will check anemia panel. - Follow CBC  Chronic pain  - stable on Duragesic patch.  Holding hydrocodone.  See #1.  Depression  - cont. Wellbutrin and Abilify.   DVT prophylaxis:   SCDs/compression stockings    Lovenox [place]     Code Status:         Full code    Family Communication:  The above findings and plan of care has been discussed with patient and family in detail, they expressed understanding and agreement of above.  Disposition Plan:    > 3 days, SNF            Consultants:   Neuro  Procedures:  No admission procedures for  hospital encounter.   Antimicrobials:  Anti-infectives (From admission, onward)   None      Objective: Vitals:   06/15/17 0600 06/15/17 0645 06/15/17 0700 06/15/17 0715  BP: 106/64 112/70 127/60 106/65  Pulse: 65 72 69 68  Resp: 10 (!) 9 (!) 9 10  Temp:      TempSrc:      SpO2: 100% 100% 100% 100%    Intake/Output Summary (Last 24 hours) at 06/15/2017 0731 Last data filed at 06/14/2017 2040 Gross per 24 hour  Intake 2000 ml  Output 251 ml  Net 1749 ml   There were no vitals filed for this visit.  Examination:  General exam: Morbidly obese., appears  calm and comfortable  Psychiatry: Poor Judgement and insightMood & affect appropriate. HEENT: WNLs Respiratory system: Clear to auscultation. Respiratory effort normal. Cardiovascular system: S1 & S2 heard, RRR. No JVD, murmurs, rubs, gallops or clicks. No pedal edema. Gastrointestinal system: Abd. nondistended, soft and nontender. No organomegaly or masses felt. Normal bowel sounds heard. Central nervous system: Alert and oriented. No focal neurological deficits. Extremities: Generalized weakness is noted, strength intact in upper ext. Lower ext. 2/ 5, chronic right knee pain tenderness Skin: No rashes, lesions or ulcers Wounds: per nursing staff   Data Reviewed: I have personally reviewed following labs and imaging studies  CBC: Recent Labs  Lab 06/14/17 1450 06/15/17 0320  WBC 9.6 7.3  HGB 11.5* 11.4*  HCT 36.9 36.7  MCV 103.9* 106.1*  PLT 220 440   Basic Metabolic Panel: Recent Labs  Lab 06/14/17 1450 06/15/17 0320  NA 140 142  K 4.7 4.5  CL 103 108  CO2 28 25  GLUCOSE 87 70  BUN 37* 35*  CREATININE 1.62* 1.55*  CALCIUM 9.2 8.8*   GFR: CrCl cannot be calculated (Unknown ideal weight.). Liver Function Tests: Recent Labs  Lab 06/14/17 1450 06/15/17 0320  AST 31 23  ALT 23 20  ALKPHOS 73 66  BILITOT 0.9 0.7  PROT 6.4* 5.7*  ALBUMIN 3.3* 2.9*   Recent Labs  Lab 06/14/17 1450  LIPASE 23   Recent Labs  Lab 06/14/17 1520  AMMONIA 31  CBG: Recent Labs  Lab 06/15/17 0139 06/15/17 0646  GLUCAP 78 76   Lipid Profile: No results for input(s): CHOL, HDL, LDLCALC, TRIG, CHOLHDL, LDLDIRECT in the last 72 hours. Thyroid Function Tests: Recent Labs    06/14/17 1550  TSH 5.100*   Anemia Panel: No results for input(s): VITAMINB12, FOLATE, FERRITIN, TIBC, IRON, RETICCTPCT in the last 72 hours. Sepsis Labs: Recent Labs  Lab 06/14/17 1608  LATICACIDVEN 1.24    No results found for this or any previous visit (from the past 240 hour(s)).     Radiology Studies: Dg Chest 2 View  Result Date: 06/14/2017 CLINICAL DATA:  Sudden decrease in level of consciousness, hypotension, hypoxic, history myasthenia gravis, non ischemic cardiomyopathy, dementia, hypertension EXAM: CHEST - 2 VIEW COMPARISON:  06/21/2016 FINDINGS: Severely rotated to the RIGHT. Enlargement of cardiac silhouette. Tortuous aorta. Pulmonary vascularity normal. Minimal atelectasis RIGHT mid lung. No gross infiltrate, pleural effusion or pneumothorax. Bones demineralized. IMPRESSION: Subsegmental atelectasis RIGHT mid lung. No additional abnormalities identified on rotated exam. Electronically Signed   By: Lavonia Dana M.D.   On: 06/14/2017 17:26    Scheduled Meds: . ARIPiprazole  2 mg Oral Daily  . aspirin  81 mg Oral Daily  . azaTHIOprine  100 mg Oral Daily  . buPROPion  150 mg Oral Daily  . carvedilol  12.5  mg Oral BID WC  . cloNIDine  0.1 mg Oral BID  . cycloSPORINE  1 drop Both Eyes BID  . dorzolamidel-timolol  1 drop Both Eyes BID  . enoxaparin (LOVENOX) injection  40 mg Subcutaneous Q24H  . fentaNYL  50 mcg Transdermal Q72H  . [START ON 06/16/2017] ferrous sulfate  325 mg Oral Q M,W,F  . isosorbide dinitrate  30 mg Oral QID  . pantoprazole  20 mg Oral Daily  . predniSONE  20 mg Oral Daily  . pyridostigmine  30 mg Oral TID   Continuous Infusions: . sodium chloride 75 mL/hr at 06/15/17 0405    Time spent: >35 minutes  Deatra James, MD [facility],  Pager [phone]  If 7PM-7AM, please contact night-coverage [email or web address]   Password [facility]  06/15/2017, 7:31 AM

## 2017-06-15 NOTE — Progress Notes (Signed)
Pt performed NIF=-25 and VC=.8L with good effort.

## 2017-06-15 NOTE — ED Notes (Signed)
Pharmacy messaged about unverified meds 

## 2017-06-15 NOTE — Progress Notes (Signed)
Patient NIF of -30, as best out of 3, with good effort.  Tolerated well.

## 2017-06-15 NOTE — Progress Notes (Signed)
Pt. Performed -30 on the NIF. Pt. Only got .6L on the Vital capacity. Pt. Appears to be tired at this time and wanting to rest.

## 2017-06-15 NOTE — H&P (Signed)
History and Physical    Jennifer [name]:[id number] DOB: [birth year] DOA: 06/14/2017  PCP: Janie Morning, DO  Patient coming from: Skilled nursing facility.  Chief Complaint: Hypoxia.  HPI: Jennifer [name] is a 82 y.o. female with history of myasthenia gravis, nonischemic cardiomyopathy last EF measured in 2014 was 45 to 50% on Lasix, chronic kidney disease stage III, chronic anemia, chronic pain was brought to the ER after patient was found to be lethargic and hypoxic.  Patient did not complain of any chest pain shortness of breath nausea vomiting diarrhea.  ED Course: In the ER patient remained mildly lethargic and hypoxic requiring oxygen which is new for the patient.  Chest x-ray was unremarkable.  On exam patient appears nonfocal.  On-call neurologist was consulted given history of myasthenia gravis with negative inspiratory force measured was around -32.  At this time neurologist felt that patient's symptoms may be related to medications and metabolic reasons and admitted for further observation.  Review of Systems: As per HPI, rest all negative.   Past Medical History:  Diagnosis Date  . Anemia   . Anxiety   . Arthritis   . Atypical chest pain    a. 2006 Cath: nl cors;  b. 05/2010 MV: no ischemia;  c. 05/2013 MV: nl EF, no ischemia. d. 06/2016: NST showing no ischemia or prior infarction  . Blood transfusion   . Cardiomyopathy, nonischemic ([place])    a. 2006 EF initially 30%;  b. 10/2009 Echo EF now 50-55%; c. 11/2012 Echo: EF 45-50% w/ septal motion abnormality (LBBB), gr1 DD, mod TR, PASP 65mmHg; d. 05/2013 MV: Nl EF by SPECT.  . Constipation due to pain medication   . Dementia   . Depression   . Diarrhea   . Frequency of urination    at night  . GERD (gastroesophageal reflux disease)   . Glaucoma   . Hypertension   . LBBB (left bundle branch block)   . Myasthenia gravis ([place]) 12/09/2016  . Osteoporosis 04/13/2006  . Paget's disease of bone 11/29/2012  . Psychosis ([place])    . Renal disorder    only has one kidney; Right kidney stopped working after last child was born  . Seizures ([place])    approximately 20 years since last seizure  . Swelling of both ankles    Takes Lasix if needed  . Thyroid disease 04/13/2006   hypothyroidism  . TIA (transient ischemic attack)     Past Surgical History:  Procedure Laterality Date  . BREAST LUMPECTOMY Left    x 2  . CARDIAC CATHETERIZATION    . CHOLECYSTECTOMY    . ESOPHAGOGASTRODUODENOSCOPY N/A 11/30/2012   Procedure: ESOPHAGOGASTRODUODENOSCOPY (EGD);  Surgeon: Wonda Horner, MD;  Location[facility] END[facility]COPY;  Service: Endoscopy;  Laterality: N/A;  . EYE SURGERY     cataract removal pt unsure which eye  . FOOT SURGERY Left   . JOINT REPLACEMENT Bilateral    knees  . NEPHRECTOMY  1963  . PATELLAR TENDON REPAIR Right 07/12/2013   Procedure: PRIMARY LEFT PATELLA TENDON REPAIR;  Surgeon: Kerin Salen, MD;  Location: [facility];  Service: Orthopedics;  Laterality: Right;  . SHOULDER SURGERY Right   . TONSILLECTOMY    . TOTAL KNEE REVISION Right 06/19/2013   Procedure: RIGHT TOTAL KNEE REVISION;  Surgeon: Kerin Salen, MD;  Location: [facility];  Service: Orthopedics;  Laterality: Right;  . TUBAL LIGATION       reports that she has never smoked. Her smokeless  tobacco use includes snuff. She reports that she does not drink alcohol or use drugs.  Allergies  Allergen Reactions  . Codeine Nausea Only    Family History  Problem Relation Age of Onset  . Stomach cancer Mother   . Diabetes Brother   . Heart attack Brother     Prior to Admission medications   Medication Sig Start Date End Date Taking? Authorizing Provider  alum & mag hydroxide-simeth ([place]) 200-200-20 MG/5ML suspension Take 30 mLs by mouth every 6 (six) hours as needed for indigestion or heartburn. AND CANNOT EXCEED 4 DOSES IN 24 HOURS   Yes [provider]  ARIPiprazole (ABILIFY) 2 MG tablet Take 2 mg by mouth daily.   Yes [provider]    aspirin 81 MG chewable tablet Chew 81 mg by mouth daily.   Yes [provider]  azaTHIOprine (IMURAN) 50 MG tablet Take 2 tablets (100 mg total) by mouth daily. 03/13/17  Yes Kathrynn Ducking, MD  buPROPion [facility] SR) 150 MG 12 hr tablet Take 150 mg by mouth daily.   Yes [provider]  Carboxymethylcellulose Sodium (REFRESH LIQUIGEL OP) Place 1 drop into both eyes 2 (two) times daily as needed (for dryness).   Yes [provider]  carvedilol (COREG) 12.5 MG tablet Take 12.5 mg by mouth 2 (two) times daily with a meal. HOLD FOR SYSTOLIC B/P <672 or HR <09   Yes [provider]  Cholecalciferol (VITAMIN D3) 2000 units TABS Take 2,000 Units by mouth daily.   Yes [provider]  diclofenac sodium (VOLTAREN) 1 % GEL Apply 2 g topically 4 (four) times daily. Rub into affected area of foot 2 to 4 times daily Patient taking differently: Apply 2 g topically See admin instructions. Apply 2 grams topically to both feet two times a day for pain 03/10/16  Yes Trula Slade, DPM  dorzolamidel-timolol (COSOPT PF) 22.3-6.8 MG/ML SOLN ophthalmic solution Place 1 drop into both eyes 2 (two) times daily.   Yes [provider]  fentaNYL (DURAGESIC - DOSED MCG/HR) 50 MCG/HR Place 50 mcg onto the skin every 3 (three) days. REMOVE OLD [facility] FIRST   Yes [provider]  ferrous sulfate 325 (65 FE) MG tablet Take 325 mg by mouth every Monday, Wednesday, and Friday.    Yes [provider]  furosemide (LASIX) 20 MG tablet Take 1 tablet (20 mg total) by mouth daily. Patient taking differently: Take 20 mg by mouth daily. HOLD FOR SYSTOLIC B/P <470 9/62/83 06/14/17 Yes Strader, [place] M, PA-C  gabapentin (NEURONTIN) 300 MG capsule Take 1 capsule by mouth 2 (two) times daily. 05/05/16  Yes [provider]  HYDROcodone-acetaminophen (NORCO/VICODIN) 5-325 MG tablet Take 1 tablet by mouth every 8 (eight) hours as needed (for breakthrough  pain).    Yes [provider]  isosorbide dinitrate (ISORDIL) 30 MG tablet Take 30 mg by mouth 4 (four) times daily. HOLD FOR SYSTOLIC B/P <662   Yes [provider]  LORazepam (ATIVAN) 0.5 MG tablet Take 0.5-1 mg by mouth See admin instructions. Take 0.5 mg by mouth in the morning and 1 mg at bedtime   Yes [provider]  losartan (COZAAR) 25 MG tablet Take 1 tablet by mouth 2 (two) times daily. HOLD FOR SYSTOLIC B/P <947 6/54/65  Yes [provider]  Melatonin 3 MG TABS Take 3 mg by mouth at bedtime.   Yes [provider]  Multiple Vitamins-Minerals (CERTAVITE/ANTIOXIDANTS) TABS Take 1 tablet by mouth  daily.   Yes [provider]  pantoprazole (PROTONIX) 20 MG tablet Take 20 mg by mouth daily.   Yes [provider]  polyethylene glycol (MIRALAX / GLYCOLAX) packet Take 17 g by mouth daily as needed (FOR CONSTIPATION). MIXED INTO 4-8 OUNCES OF LIQUID   Yes [provider]  predniSONE (DELTASONE) 20 MG tablet Take 1 tablet (20 mg total) by mouth daily. 12/09/16  Yes Kathrynn Ducking, MD  pyridostigmine (MESTINON) 60 MG tablet Take 0.5 tablets (30 mg total) by mouth 3 (three) times daily. 03/13/17  Yes Kathrynn Ducking, MD  traZODone (DESYREL) 50 MG tablet Take 25 mg by mouth at bedtime.   Yes [provider]  vitamin B-12 (CYANOCOBALAMIN) 100 MCG tablet Take 50 mcg by mouth daily.    Yes [provider]  acetaminophen (TYLENOL) 500 MG tablet Take 500 mg by mouth every 4 (four) hours as needed for mild pain or headache (or fever of 99.5-101 F).     [provider]  cloNIDine (CATAPRES) 0.1 MG tablet Take 0.1 mg by mouth 2 (two) times daily.    [provider]  cycloSPORINE (RESTASIS) 0.05 % ophthalmic emulsion Place 1 drop into both eyes 2 (two) times daily.    [provider]  NONFORMULARY OR COMPOUNDED ITEM [facility]:  "Authorized Substitute" Pain Cream Formulation - Ibuprofen  15%, Gaclofen 1%, Gabapentin 3%, Lidocaine 2%, apply 2 grams to affected area 3 times daily. Patient not taking: Reported on 06/14/2017 03/16/16   Trula Slade, DPM    Physical Exam: Vitals:   06/14/17 2330 06/15/17 0045 06/15/17 0115 06/15/17 0134  BP: (!) 101/59 122/68 118/60 116/76  Pulse: 61 64 65 63  Resp: 10 (!) 9 (!) 9 (!) 9  Temp:      TempSrc:      SpO2: 99% 99% 100% 99%      Constitutional: Moderately built and nourished. Vitals:   06/14/17 2330 06/15/17 0045 06/15/17 0115 06/15/17 0134  BP: (!) 101/59 122/68 118/60 116/76  Pulse: 61 64 65 63  Resp: 10 (!) 9 (!) 9 (!) 9  Temp:      TempSrc:      SpO2: 99% 99% 100% 99%   Eyes: Anicteric no pallor. ENMT: No discharge from the ears eyes nose or mouth. Neck: No mass palpated no JVD appreciated. Respiratory: No rhonchi or crepitations. Cardiovascular: S1-S2 heard no murmurs appreciated. Abdomen: Soft nontender bowel sounds present. Musculoskeletal: No edema.  No joint effusion. Skin: No rash.  Skin appears warm. Neurologic: Patient is mildly lethargic but oriented to name and place.  Moves all extremities. Psychiatric: Mildly lethargic.   Labs on Admission: I have personally reviewed following labs and imaging studies  CBC: Recent Labs  Lab 06/14/17 1450  WBC 9.6  HGB 11.5*  HCT 36.9  MCV 103.9*  PLT 650   Basic Metabolic Panel: Recent Labs  Lab 06/14/17 1450  NA 140  K 4.7  CL 103  CO2 28  GLUCOSE 87  BUN 37*  CREATININE 1.62*  CALCIUM 9.2   GFR: CrCl cannot be calculated (Unknown ideal weight.). Liver Function Tests: Recent Labs  Lab 06/14/17 1450  AST 31  ALT 23  ALKPHOS 73  BILITOT 0.9  PROT 6.4*  ALBUMIN 3.3*   Recent Labs  Lab 06/14/17 1450  LIPASE 23   Recent Labs  Lab 06/14/17 1520  AMMONIA 31   Coagulation Profile: No results for input(s): INR, PROTIME in the last 168 hours. Cardiac Enzymes:  No results for input(s): CKTOTAL, CKMB, CKMBINDEX, TROPONINI in the  last 168 hours. BNP (last 3 results) No results for input(s): PROBNP in the last 8760 hours. HbA1C: No results for input(s): HGBA1C in the last 72 hours. CBG: Recent Labs  Lab 06/15/17 0139  GLUCAP 78   Lipid Profile: No results for input(s): CHOL, HDL, LDLCALC, TRIG, CHOLHDL, LDLDIRECT in the last 72 hours. Thyroid Function Tests: Recent Labs    06/14/17 1550  TSH 5.100*   Anemia Panel: No results for input(s): VITAMINB12, FOLATE, FERRITIN, TIBC, IRON, RETICCTPCT in the last 72 hours. Urine analysis:    Component Value Date/Time   COLORURINE YELLOW 06/14/2017 2039   APPEARANCEUR CLEAR 06/14/2017 2039   LABSPEC 1.017 06/14/2017 2039   PHURINE 5.0 06/14/2017 2039   GLUCOSEU NEGATIVE 06/14/2017 2039   HGBUR NEGATIVE 06/14/2017 2039   BILIRUBINUR NEGATIVE 06/14/2017 2039   [place] NEGATIVE 06/14/2017 2039   PROTEINUR NEGATIVE 06/14/2017 2039   UROBILINOGEN 0.2 06/11/2013 1110   NITRITE NEGATIVE 06/14/2017 2039   LEUKOCYTESUR NEGATIVE 06/14/2017 2039   Sepsis Labs: @LABRCNTIP (procalcitonin:4,lacticidven:4) )No results found for this or any previous visit (from the past 240 hour(s)).   Radiological Exams on Admission: Dg Chest 2 View  Result Date: 06/14/2017 CLINICAL DATA:  Sudden decrease in level of consciousness, hypotension, hypoxic, history myasthenia gravis, non ischemic cardiomyopathy, dementia, hypertension EXAM: CHEST - 2 VIEW COMPARISON:  06/21/2016 FINDINGS: Severely rotated to the RIGHT. Enlargement of cardiac silhouette. Tortuous aorta. Pulmonary vascularity normal. Minimal atelectasis RIGHT mid lung. No gross infiltrate, pleural effusion or pneumothorax. Bones demineralized. IMPRESSION: Subsegmental atelectasis RIGHT mid lung. No additional abnormalities identified on rotated exam. Electronically Signed   By: Lavonia Dana M.D.   On: 06/14/2017 17:26    EKG: Independently reviewed.  Normal sinus rhythm with LBBB.  Assessment/Plan Principal Problem:   Acute  encephalopathy Active Problems:   Hypothyroidism   Essential hypertension   Nonischemic cardiomyopathy (HCC)   LBBB (left bundle branch block)   Myasthenia gravis (HCC)   Acute metabolic encephalopathy    1. Acute encephalopathy with hypoxia -ABG done shows PCO2 of 48.  Appreciate neurology consult.  As per neurology advised to hold off sedatives.  I have morning of patient's lorazepam trazodone melatonin and Neurontin for now.  Holding hydrocodone.  If continues to remain lethargic may have to hold off Duragesic patch.  Will check CT head and ammonia levels.  Closely follow NIF and vital capacity given the history of myasthenia gravis. 2. History of myasthenia gravis follow NIF and vital capacity.  On Mestinon prednisone and Imuran. 3. Nonischemic cardiomyopathy last EF measured was 45 to 50%.  Lasix and Cozaar on hold for now.  If creatinine does not worsen may restart Cozaar. 4. Chronic kidney disease stage III creatinine appears to be at baseline.  Gently hydrating for now.  Closely follow respiratory status. 5. Hypertension on clonidine and Isordil.  We will hold Cozaar for now.  PRN IV hydralazine. 6. Anemia likely related to renal disease.  Since it shows macrocytic picture will check anemia panel.  Follow CBC. 7. Chronic pain on Duragesic patch.  Holding hydrocodone.  See #1. 8. Depression on Wellbutrin and Abilify.  DVT prophylaxis: Lovenox. Code Status: Full code. Family Communication: No family at the bedside. Disposition Plan: Back to nursing facility. Consults called: Neurology. Admission status: Observation.   Rise Patience MD [facility] Pager [phone].  If 7PM-7AM, please contact night-coverage [email or web address] Password [email or web address]  06/15/2017, 3:22 AM

## 2017-06-15 NOTE — ED Notes (Signed)
Patient is sitting up in stretcher  Breakfast tray given. Patient is alert oriented.

## 2017-06-16 DIAGNOSIS — L8990 Pressure ulcer of unspecified site, unspecified stage: Secondary | ICD-10-CM

## 2017-06-16 DIAGNOSIS — G9340 Encephalopathy, unspecified: Secondary | ICD-10-CM | POA: Diagnosis not present

## 2017-06-16 LAB — URINE CULTURE: Culture: NO GROWTH

## 2017-06-16 LAB — T3, FREE: T3, Free: 3.6 pg/mL (ref 2.0–4.4)

## 2017-06-16 MED ORDER — ISOSORBIDE DINITRATE 10 MG PO TABS
20.0000 mg | ORAL_TABLET | Freq: Three times a day (TID) | ORAL | Status: DC
  Administered 2017-06-16 – 2017-06-20 (×11): 20 mg via ORAL
  Filled 2017-06-16 (×11): qty 2

## 2017-06-16 MED ORDER — IMMUNE GLOBULIN (HUMAN) 20 GM/200ML IV SOLN
400.0000 mg/kg | INTRAVENOUS | Status: AC
  Administered 2017-06-17 – 2017-06-20 (×4): 40 g via INTRAVENOUS
  Filled 2017-06-16 (×4): qty 400

## 2017-06-16 MED ORDER — CARVEDILOL 6.25 MG PO TABS
6.2500 mg | ORAL_TABLET | Freq: Two times a day (BID) | ORAL | Status: DC
  Administered 2017-06-16 – 2017-06-19 (×6): 6.25 mg via ORAL
  Filled 2017-06-16 (×6): qty 1

## 2017-06-16 MED ORDER — IMMUNE GLOBULIN (HUMAN) 20 GM/200ML IV SOLN
400.0000 mg/kg | Freq: Once | INTRAVENOUS | Status: AC
  Administered 2017-06-16: 40 g via INTRAVENOUS
  Filled 2017-06-16: qty 400

## 2017-06-16 NOTE — Progress Notes (Signed)
FVC .9 and NIF - 25 with good effort. HR 61, RR 12, 1L [place] sats 97%, Bp 86/39, BBS claerdim.

## 2017-06-16 NOTE — Progress Notes (Addendum)
Subjective: No complaints of SOB.   Exam: Vitals:   06/16/17 0352 06/16/17 0805  BP: 126/63 136/74  Pulse: 65 67  Resp: (!) 9 14  Temp:  97.9 F (36.6 C)  SpO2: 95% 93%    Physical Exam   HEENT-  Normocephalic, no lesions, without obvious abnormality.  Normal external eye and conjunctiva.   Lungs- no excessive working breathing.  Saturations within normal limits Abdomen- All 4 quadrants palpated and nontender Extremities- Warm, dry and intact Musculoskeletal-no joint tenderness, deformity or swelling Skin-warm and dry, no hyperpigmentation, vitiligo, or suspicious lesions    Neuro:  Mental Status: Alert, oriented, thought content appropriate.  Speech fluent without evidence of aphasia.  Able to follow 3 step commands without difficulty. Cranial Nerves: II: Visual fields grossly normal,  III,IV, VI: ptosis not present, extra-ocular motions intact bilaterally pupils equal, round, reactive to light and accommodation V,VII: smile symmetric, facial light touch sensation normal bilaterally VIII: hearing normal bilaterally IX,X: uvula rises symmetrically XI: bilateral shoulder shrug XII: midline tongue extension Motor: Right : Upper extremity   5/5    Left:     Upper extremity   4/5 due to pain  Lower extremity   4/5 (pain)    Lower extremity   5/5 Tone and bulk:normal tone throughout; no atrophy noted Sensory: Pinprick and light touch intact throughout, bilaterally Deep Tendon Reflexes: 2+ and symmetric throughout Plantars: Right: downgoing   Left: downgoing     Medications:  Scheduled: . ARIPiprazole  2 mg Oral Daily  . aspirin  81 mg Oral Daily  . azaTHIOprine  100 mg Oral Daily  . buPROPion  150 mg Oral Daily  . carvedilol  12.5 mg Oral BID WC  . cloNIDine  0.1 mg Oral BID  . cycloSPORINE  1 drop Both Eyes BID  . dorzolamide-timolol  1 drop Both Eyes BID  . enoxaparin (LOVENOX) injection  40 mg Subcutaneous Q24H  . ferrous sulfate  325 mg Oral Q M,W,F  .  isosorbide dinitrate  30 mg Oral QID  . mouth rinse  15 mL Mouth Rinse BID  . pantoprazole  20 mg Oral Daily  . predniSONE  20 mg Oral Daily  . pyridostigmine  30 mg Oral TID   Continuous:   Pertinent Labs/Diagnostics: NIF=-25 and VC=.8L with good effort. But able to count to 15 in one breath UC negative Prelim BC negative   Dg Chest 2 View  Result Date: 06/14/2017 CLINICAL DATA:  Sudden decrease in level of consciousness, hypotension, hypoxic, history myasthenia gravis, non ischemic cardiomyopathy, dementia, hypertension EXAM: CHEST - 2 VIEW COMPARISON:  06/21/2016 FINDINGS: Severely rotated to the RIGHT. Enlargement of cardiac silhouette. Tortuous aorta. Pulmonary vascularity normal. Minimal atelectasis RIGHT mid lung. No gross infiltrate, pleural effusion or pneumothorax. Bones demineralized. IMPRESSION: Subsegmental atelectasis RIGHT mid lung. No additional abnormalities identified on rotated exam. Electronically Signed   By: Lavonia Dana M.D.   On: 06/14/2017 17:26   Ct Head Wo Contrast  Result Date: 06/15/2017 CLINICAL DATA:  Altered mental status EXAM: CT HEAD WITHOUT CONTRAST TECHNIQUE: Contiguous axial images were obtained from the base of the skull through the vertex without intravenous contrast. COMPARISON:  06/21/2016 FINDINGS: Brain: There is atrophy and chronic small vessel disease changes. No acute intracranial abnormality. Specifically, no hemorrhage, hydrocephalus, mass lesion, acute infarction, or significant intracranial injury. Vascular: No hyperdense vessel or unexpected calcification. Skull: No acute calvarial abnormality. Sinuses/Orbits: Visualized paranasal sinuses and mastoids clear. Orbital soft tissues unremarkable. Other: None IMPRESSION: No acute  intracranial abnormality. Atrophy, chronic microvascular disease. Electronically Signed   By: Rolm Baptise M.D.   On: 06/15/2017 10:02     Etta Quill PA-C [facility] [phone]  On my exam, with leftward  gaze she is slightly dysconjugate, she is dysarthric as well. Neck flexion 4/5  Impression: 82 year old female who presented initally with what I suspect to be toxic metabolic encephalopathy secondary to multiple sedating medications. She is on the cusp of meeting intubation criteria by NIF and VC, but has good bneck flexion strength. On my exam today, she is slightly dysarthric and has dysconjugate gaze on leftward gaze. I have enough concern about her respiratory numbers that I think the prudent thing to do would be to give her a course of IVIG at this point as I think the risk of not treating if this is MG would be considerable.   Recommendations: 1) continue frequent negative inspiratory force 2) continue Mestinon 30 3 times daily, prednisone 20 daily, azathioprine 100 mill grams daily 3) IVIG 400mg /day x 5 days.  4) will follow    Roland Rack, MD [facility] [phone]  If 7pm- 7am, please page neurology on call as listed in [place].   06/16/2017, 10:51 AM

## 2017-06-16 NOTE — Progress Notes (Signed)
NIF-27 VC .8L with good pt effort. Pt is comfortable at this time and normal VS.

## 2017-06-16 NOTE — Care Management Obs Status (Signed)
MEDICARE OBSERVATION STATUS NOTIFICATION   Patient Details  Name: Jennifer [name] MRN: [id number] Date of Birth: 1936/02/12   Medicare Observation Status Notification Given:  Yes    Maryclare Labrador, RN 06/16/2017, 1:58 PM

## 2017-06-16 NOTE — Progress Notes (Addendum)
PROGRESS NOTE    Patient: Jennifer [name]     PCP: Janie Morning, DO                    DOB: 06-26-1935            DOA: 06/14/2017 [id number]             DOS: 06/16/2017, 2:21 PM   LOS: 0 days   Date of Service: The patient was seen and examined on 06/16/2017  Subjective:   Patient was seen and examined this morning, was awake alert with... No acute distress.  No issues overnight.   Subsequent follow-up visit by neurologist --she seems subtle symptoms,  having borderline NIF and VC expressed difficulty understanding... Continue to have no excessive respiratory efforts, denied shortness of breath. Subsequently neurologist decided to pursue with a course of IVIG treatment  ------------------------------------------------------------------------------------------------------------------------------  Brief Narrative:   Duayne Cal [place] is a 82 y.o. female PMH of MG, depression, dementia, HTN, non-ischemic cardiomyopathy, TIA in past, currently on Mestinon 30q8h, Prednisone 20 daily, and Imuran 100 daily, who has been brought in to the hospital [facility] when she was noted to have decreased level of consciousness, hypotension and was hypoxic with O2 sats in the low 80s. She is at baseline not on Oxygen supplememtation. On admission patient was lethargic, hypoxic.  __________________________________________________________________________________ Principal Problem:   Acute encephalopathy Active Problems:   Hypothyroidism   Pernicious anemia   Essential hypertension   CKD (chronic kidney disease) stage 3, GFR 30-59 ml/min (HCC)   Nonischemic cardiomyopathy (HCC)   LBBB (left bundle branch block)   Myasthenia gravis ([place])   Acute metabolic encephalopathy   Hyperlipidemia   Pressure injury of skin   Assessment & Plan:   Principal Problem:   Acute toxic metabolic encephalopathy -hypoxia Much improved, likely from polypharmacy at home. -Morning respiratory team reporting FVC  0.9 and an NIF -25, but heart rate of 61 respiratory rate of 92 on 1 L of oxygen,  -No signs of infection, sepsis present, UA chest x-ray normal - Holding all sedative: Included, trazodone, lorazepam, melatonin and Neurontin for now.  Holding hydrocodone. -CT of the head was reviewed, chronic changes, No acute abnormalities -will F/up Labs including ammonia level    H/o Myasthenia gravis ([place]) - Neuro. Following  - recommending :Check NIFs and FVCs q8h. Her last NIF was -32. Intubation is advised if NIF less than -20. -c/w current MG meds at current doses - -neurology patient's IIF with -25, but a good O2 sat of 97% on 1 L of normal respiratory effort surprising given her condition,  -Patient start resembling some weakness, early early motor dysfunction in head and neck -Neurology decided to pursue with IVIG treatment course  Borderline hypertension -Trend closely, last reported blood pressure 86/39 - anticipating IV fluid normal saline bolus, if she become symptomatic -Hold blood pressure medication  once she is more stable we will reduce of Coreg to 6.25 mg twice daily, will hold clonidine and Cozaar, reduce isosorbide..    Active Problems:    Hypothyroidism -she is not on any med. From home -mildly Elevated TSH; 5.1 - checking Free T4,3  Nonischemic cardiomyopathy  -  EF measured was 45 to 50%.  -  Lasix and Cozaar on hold for now.   - when creatinine stable, anticipating restart Cozaar.  Chronic kidney disease stage III -Per patient's daughter patient only has one kidney, he does not know remember but one kidney was removed  a long time of -  creatinine appears to be at baseline.   - cont. Gently hydrating  -  Closely follow respiratory status.  Hypertension  -Borderline hypotensive this morning,  We will reduce: Coreg to 6.25 mg twice daily, will hold clonidine and Cozaar, reduce isosorbide -  PRN IV hydralazine.  Anemia likely related to renal disease.  -  Since it  shows macrocytic picture will check anemia panel. - Follow CBC  Chronic pain  - stable on Duragesic patch.  Holding hydrocodone.  See #1.  Depression  - cont. Wellbutrin and Abilify.   DVT prophylaxis:   SCDs/compression stockings    Lovenox [place]     Code Status:         Full code    Family Communication:  The above finding and plan of care was discussed with the patient's daughter in detail. Patient was also informed before finding and plan of care in the morning but she seemed to have a poor insight    Disposition Plan:    > 3 days, home Vs  Home with [facility] vs SNF            Consultants:   Neuro  Procedures:  No admission procedures for hospital encounter.   Antimicrobials:  Anti-infectives (From admission, onward)   None      Objective: Vitals:   06/16/17 0352 06/16/17 0500 06/16/17 0805 06/16/17 1236  BP: 126/63  136/74 (!) 86/39  Pulse: 65  67 66  Resp: (!) 9  14 16   Temp:   97.9 F (36.6 C) 98.1 F (36.7 C)  TempSrc:   Oral Oral  SpO2: 95%  93% 95%  Weight:  102.2 kg (225 lb 5 oz)    Height:        Intake/Output Summary (Last 24 hours) at 06/16/2017 1421 Last data filed at 06/16/2017 0639 Gross per 24 hour  Intake 1305 ml  Output 100 ml  Net 1205 ml   Filed Weights   06/15/17 1800 06/16/17 0500  Weight: 101.9 kg (224 lb 10.4 oz) 102.2 kg (225 lb 5 oz)    Examination:  General exam: Morbidly obese., appears calm and comfortable  Psychiatry: Poor Judgement and insightMood & affect appropriate. HEENT: WNLs Respiratory system: Clear to auscultation. Respiratory effort normal. Cardiovascular system: S1 & S2 heard, RRR. No JVD, murmurs, rubs, gallops or clicks. No pedal edema. Gastrointestinal system: Abd. nondistended, soft and nontender. No organomegaly or masses felt. Normal bowel sounds heard. Central nervous system: Alert and oriented. No focal neurological deficits. Extremities: Generalized weakness is noted, strength intact in upper ext. Lower  ext. 2/ 5, chronic right knee pain tenderness Skin: No rashes, lesions or ulcers Wounds: per nursing staff   Data Reviewed: I have personally reviewed following labs and imaging studies  CBC: Recent Labs  Lab 06/14/17 1450 06/15/17 0320  WBC 9.6 7.3  HGB 11.5* 11.4*  HCT 36.9 36.7  MCV 103.9* 106.1*  PLT 220 034   Basic Metabolic Panel: Recent Labs  Lab 06/14/17 1450 06/15/17 0320  NA 140 142  K 4.7 4.5  CL 103 108  CO2 28 25  GLUCOSE 87 70  BUN 37* 35*  CREATININE 1.62* 1.55*  CALCIUM 9.2 8.8*   GFR: Estimated Creatinine Clearance: 34.2 mL/min (A) (by C-G formula based on SCr of 1.55 mg/dL (H)). Liver Function Tests: Recent Labs  Lab 06/14/17 1450 06/15/17 0320  AST 31 23  ALT 23 20  ALKPHOS 73 66  BILITOT 0.9 0.7  PROT 6.4* 5.7*  ALBUMIN 3.3* 2.9*   Recent Labs  Lab 06/14/17 1450  LIPASE 23   Recent Labs  Lab 06/14/17 1520 06/15/17 1219  AMMONIA 31 37*  CBG: Recent Labs  Lab 06/15/17 0139 06/15/17 0646  GLUCAP 78 76   Lipid Profile: No results for input(s): CHOL, HDL, LDLCALC, TRIG, CHOLHDL, LDLDIRECT in the last 72 hours. Thyroid Function Tests: Recent Labs    06/14/17 1550 06/15/17 1219  TSH 5.100*  --   FREET4  --  1.05  T3FREE  --  3.6   Anemia Panel: Recent Labs    06/15/17 1219  VITAMINB12 774  FOLATE 53.7  FERRITIN 249  TIBC 258  IRON 35  RETICCTPCT 2.1   Sepsis Labs: Recent Labs  Lab 06/14/17 1608  LATICACIDVEN 1.24    Recent Results (from the past 240 hour(s))  Blood culture (routine x 2)     Status: None (Preliminary result)   Collection Time: 06/14/17  3:50 PM  Result Value Ref Range Status   Specimen Description BLOOD LEFT ANTECUBITAL  Final   Special Requests   Final    BOTTLES DRAWN AEROBIC AND ANAEROBIC Blood Culture adequate volume   Culture   Final    NO GROWTH < 24 HOURS Performed at [facility], [place] [address], [place], [place] [address]    Report Status PENDING  Incomplete  Urine  culture     Status: None   Collection Time: 06/14/17  8:39 PM  Result Value Ref Range Status   Specimen Description URINE, CATHETERIZED  Final   Special Requests NONE  Final   Culture   Final    NO GROWTH Performed at [facility], [address], [place], [place] [address]    Report Status 06/16/2017 FINAL  Final  MRSA PCR Screening     Status: None   Collection Time: 06/15/17  7:09 PM  Result Value Ref Range Status   MRSA by PCR NEGATIVE NEGATIVE Final    Comment:        The GeneXpert MRSA Assay (FDA approved for NASAL specimens only), is one component of a comprehensive MRSA colonization surveillance program. It is not intended to diagnose MRSA infection nor to guide or monitor treatment for MRSA infections. Performed at [facility], [place] [address], [place], [place] [address]       Radiology Studies: Dg Chest 2 View  Result Date: 06/14/2017 CLINICAL DATA:  Sudden decrease in level of consciousness, hypotension, hypoxic, history myasthenia gravis, non ischemic cardiomyopathy, dementia, hypertension EXAM: CHEST - 2 VIEW COMPARISON:  06/21/2016 FINDINGS: Severely rotated to the RIGHT. Enlargement of cardiac silhouette. Tortuous aorta. Pulmonary vascularity normal. Minimal atelectasis RIGHT mid lung. No gross infiltrate, pleural effusion or pneumothorax. Bones demineralized. IMPRESSION: Subsegmental atelectasis RIGHT mid lung. No additional abnormalities identified on rotated exam. Electronically Signed   By: Lavonia Dana M.D.   On: 06/14/2017 17:26   Ct Head Wo Contrast  Result Date: 06/15/2017 CLINICAL DATA:  Altered mental status EXAM: CT HEAD WITHOUT CONTRAST TECHNIQUE: Contiguous axial images were obtained from the base of the skull through the vertex without intravenous contrast. COMPARISON:  06/21/2016 FINDINGS: Brain: There is atrophy and chronic small vessel disease changes. No acute intracranial abnormality. Specifically, no hemorrhage, hydrocephalus, mass  lesion, acute infarction, or significant intracranial injury. Vascular: No hyperdense vessel or unexpected calcification. Skull: No acute calvarial abnormality. Sinuses/Orbits: Visualized paranasal sinuses and mastoids clear. Orbital soft tissues unremarkable. Other: None IMPRESSION: No acute  intracranial abnormality. Atrophy, chronic microvascular disease. Electronically Signed   By: Rolm Baptise M.D.   On: 06/15/2017 10:02    Scheduled Meds: . ARIPiprazole  2 mg Oral Daily  . aspirin  81 mg Oral Daily  . azaTHIOprine  100 mg Oral Daily  . buPROPion  150 mg Oral Daily  . carvedilol  12.5 mg Oral BID WC  . cloNIDine  0.1 mg Oral BID  . cycloSPORINE  1 drop Both Eyes BID  . dorzolamide-timolol  1 drop Both Eyes BID  . enoxaparin (LOVENOX) injection  40 mg Subcutaneous Q24H  . ferrous sulfate  325 mg Oral Q M,W,F  . isosorbide dinitrate  30 mg Oral QID  . mouth rinse  15 mL Mouth Rinse BID  . pantoprazole  20 mg Oral Daily  . predniSONE  20 mg Oral Daily  . pyridostigmine  30 mg Oral TID   Continuous Infusions:   Time spent: >35 minutes  Deatra James, MD [facility],  Pager [phone]  If 7PM-7AM, please contact night-coverage [email or web address]   Password [email or web address]  06/16/2017, 2:21 PM

## 2017-06-17 DIAGNOSIS — H409 Unspecified glaucoma: Secondary | ICD-10-CM | POA: Diagnosis present

## 2017-06-17 DIAGNOSIS — K219 Gastro-esophageal reflux disease without esophagitis: Secondary | ICD-10-CM | POA: Diagnosis present

## 2017-06-17 DIAGNOSIS — I5022 Chronic systolic (congestive) heart failure: Secondary | ICD-10-CM | POA: Diagnosis present

## 2017-06-17 DIAGNOSIS — R0902 Hypoxemia: Secondary | ICD-10-CM

## 2017-06-17 DIAGNOSIS — M1990 Unspecified osteoarthritis, unspecified site: Secondary | ICD-10-CM | POA: Diagnosis present

## 2017-06-17 DIAGNOSIS — Z96653 Presence of artificial knee joint, bilateral: Secondary | ICD-10-CM | POA: Diagnosis present

## 2017-06-17 DIAGNOSIS — I447 Left bundle-branch block, unspecified: Secondary | ICD-10-CM | POA: Diagnosis present

## 2017-06-17 DIAGNOSIS — G7000 Myasthenia gravis without (acute) exacerbation: Secondary | ICD-10-CM | POA: Diagnosis present

## 2017-06-17 DIAGNOSIS — G9341 Metabolic encephalopathy: Secondary | ICD-10-CM | POA: Diagnosis not present

## 2017-06-17 DIAGNOSIS — E039 Hypothyroidism, unspecified: Secondary | ICD-10-CM | POA: Diagnosis present

## 2017-06-17 DIAGNOSIS — Z800 Family history of malignant neoplasm of digestive organs: Secondary | ICD-10-CM | POA: Diagnosis not present

## 2017-06-17 DIAGNOSIS — I10 Essential (primary) hypertension: Secondary | ICD-10-CM | POA: Diagnosis not present

## 2017-06-17 DIAGNOSIS — Z7952 Long term (current) use of systemic steroids: Secondary | ICD-10-CM | POA: Diagnosis not present

## 2017-06-17 DIAGNOSIS — G9340 Encephalopathy, unspecified: Secondary | ICD-10-CM | POA: Diagnosis not present

## 2017-06-17 DIAGNOSIS — Z9049 Acquired absence of other specified parts of digestive tract: Secondary | ICD-10-CM | POA: Diagnosis not present

## 2017-06-17 DIAGNOSIS — Z8673 Personal history of transient ischemic attack (TIA), and cerebral infarction without residual deficits: Secondary | ICD-10-CM | POA: Diagnosis not present

## 2017-06-17 DIAGNOSIS — I428 Other cardiomyopathies: Secondary | ICD-10-CM | POA: Diagnosis present

## 2017-06-17 DIAGNOSIS — Z905 Acquired absence of kidney: Secondary | ICD-10-CM | POA: Diagnosis not present

## 2017-06-17 DIAGNOSIS — J9601 Acute respiratory failure with hypoxia: Secondary | ICD-10-CM | POA: Diagnosis present

## 2017-06-17 DIAGNOSIS — Z7982 Long term (current) use of aspirin: Secondary | ICD-10-CM | POA: Diagnosis not present

## 2017-06-17 DIAGNOSIS — M810 Age-related osteoporosis without current pathological fracture: Secondary | ICD-10-CM | POA: Diagnosis present

## 2017-06-17 DIAGNOSIS — I130 Hypertensive heart and chronic kidney disease with heart failure and stage 1 through stage 4 chronic kidney disease, or unspecified chronic kidney disease: Secondary | ICD-10-CM | POA: Diagnosis present

## 2017-06-17 DIAGNOSIS — Z9849 Cataract extraction status, unspecified eye: Secondary | ICD-10-CM | POA: Diagnosis not present

## 2017-06-17 DIAGNOSIS — G92 Toxic encephalopathy: Secondary | ICD-10-CM | POA: Diagnosis present

## 2017-06-17 DIAGNOSIS — E785 Hyperlipidemia, unspecified: Secondary | ICD-10-CM | POA: Diagnosis present

## 2017-06-17 DIAGNOSIS — M889 Osteitis deformans of unspecified bone: Secondary | ICD-10-CM | POA: Diagnosis present

## 2017-06-17 DIAGNOSIS — F0390 Unspecified dementia without behavioral disturbance: Secondary | ICD-10-CM | POA: Diagnosis present

## 2017-06-17 DIAGNOSIS — N183 Chronic kidney disease, stage 3 (moderate): Secondary | ICD-10-CM

## 2017-06-17 LAB — BASIC METABOLIC PANEL
Anion gap: 7 (ref 5–15)
BUN: 23 mg/dL — ABNORMAL HIGH (ref 6–20)
CHLORIDE: 108 mmol/L (ref 101–111)
CO2: 24 mmol/L (ref 22–32)
Calcium: 9.2 mg/dL (ref 8.9–10.3)
Creatinine, Ser: 1.37 mg/dL — ABNORMAL HIGH (ref 0.44–1.00)
GFR calc non Af Amer: 36 mL/min — ABNORMAL LOW (ref >60)
GFR, EST AFRICAN AMERICAN: 41 mL/min — AB (ref >60)
Glucose, Bld: 96 mg/dL (ref 65–99)
POTASSIUM: 4 mmol/L (ref 3.5–5.1)
SODIUM: 139 mmol/L (ref 135–145)

## 2017-06-17 MED ORDER — TRAMADOL HCL 50 MG PO TABS
50.0000 mg | ORAL_TABLET | Freq: Two times a day (BID) | ORAL | Status: DC | PRN
  Administered 2017-06-17: 50 mg via ORAL
  Filled 2017-06-17: qty 1

## 2017-06-17 MED ORDER — PROMETHAZINE HCL 25 MG/ML IJ SOLN
12.5000 mg | Freq: Once | INTRAMUSCULAR | Status: AC
  Administered 2017-06-17: 12.5 mg via INTRAVENOUS
  Filled 2017-06-17: qty 1

## 2017-06-17 MED ORDER — FENTANYL CITRATE (PF) 100 MCG/2ML IJ SOLN
12.5000 ug | INTRAMUSCULAR | Status: DC | PRN
  Administered 2017-06-17 – 2017-06-23 (×17): 12.5 ug via INTRAVENOUS
  Filled 2017-06-17 (×17): qty 2

## 2017-06-17 NOTE — Progress Notes (Signed)
PROGRESS NOTE    Jennifer [name]  IOX:[id number] DOB: 12-Dec-1935 DOA: 06/14/2017 PCP: Janie Morning, DO    Brief Narrative:  82 year old female who presented with hypoxemia.  She does have a significant past medical history for myasthenia gravis, nonischemic cardiomyopathy with left ventricle ejection fraction 45 to 50%, chronic kidney disease stage III, chronic pain syndrome.  Patient was found lethargic and hypoxic.  Initial physical examination blood pressure 101/59, heart rate 61, respiratory rate 9-10, oxygen saturation 99% on supplemental oxygen.  She was awake and alert, her lungs were clear to auscultation, heart S1-S2 present and rhythmic, abdomen soft nontender, no lower extremity edema.  Her negative inspiratory force was -32.   Patient was admitted to the hospital with working diagnosis of acute metabolic/ toxic encephalopathy due to acute hypoxic respiratory failure, and sedatives.   Assessment & Plan:   Principal Problem:   Acute encephalopathy Active Problems:   Hypothyroidism   Pernicious anemia   Essential hypertension   CKD (chronic kidney disease) stage 3, GFR 30-59 ml/min (HCC)   Nonischemic cardiomyopathy (HCC)   LBBB (left bundle branch block)   Myasthenia gravis ([place])   Acute metabolic encephalopathy   Hyperlipidemia   Pressure injury of skin   1.  Acute toxic/metabolic encephalopathy. Patient is more awake and alert, follows commands and answers questions appropriately. Will continue IV IG per protocol and Neurology recommendations, this am NIF negative 28 cm H20, V= 0.9 L, Will continue telemetry and oxymetry monitoring.    2.  Acute hypoxic respite failure. Will continue oxymetry monitoring, supplemental 02 per [place].   3.  Acute decompensation of myasthenia gravis. Patient responding well to IV IG, negative inspiratory force 28 cm H20 and vital capacity 0.9L. Follow with neurology recommendations. Patient a home uses a wheelchair for mobility. Follow renal  function. Continue pyridostimine, Imuran and prednisone.   4.  Chronic kidney disease stage III. Renal function with serum cr at 1,37 with K at 4.0 and serum bicarbonate at 24.   5.  Hypertension. Continue blood pressure control with isosorbide, carvedilol, .   6.  Hypothyroidism. Will continue levothyroxine.    DVT prophylaxis: enoxaparin   Code Status: full  Family Communication: no family at the bedside  Disposition Plan: home when completed IV IG    Consultants:   Neurology   Procedures:     Antimicrobials:    Subjective: Patient reports improvement in her symptoms, improved dysphagia and dyspnea. At home uses a wheelchair, no confusion or agitation.   Objective: Vitals:   06/17/17 0018 06/17/17 0348 06/17/17 0700 06/17/17 0704  BP: (!) 149/69 (!) 154/70  (!) 154/70  Pulse: 78 77 73 79  Resp: 19 15 14 16   Temp:  98.8 F (37.1 C)  98.7 F (37.1 C)  TempSrc:  Oral  Oral  SpO2: 96% 95% 96% 97%  Weight:  100.5 kg (221 lb 9 oz)    Height:        Intake/Output Summary (Last 24 hours) at 06/17/2017 0949 Last data filed at 06/17/2017 0400 Gross per 24 hour  Intake 294.19 ml  Output 650 ml  Net -355.81 ml   Filed Weights   06/15/17 1800 06/16/17 0500 06/17/17 0348  Weight: 101.9 kg (224 lb 10.4 oz) 102.2 kg (225 lb 5 oz) 100.5 kg (221 lb 9 oz)    Examination:   General: deconditioned.  Neurology: Awake and alert, non focal  E ENT: mild pallor, no icterus, oral mucosa moist Cardiovascular: No JVD. S1-S2 present, rhythmic,  no gallops, rubs, or murmurs. Non pitting lower extremity edema. Pulmonary: vesicular breath sounds bilaterally, adequate air movement, no wheezing, rhonchi or rales. Gastrointestinal. Abdomen protuberant with no organomegaly, non tender, no rebound or guarding Skin. No rashes Musculoskeletal: no joint deformities     Data Reviewed: I have personally reviewed following labs and imaging studies  CBC: Recent Labs  Lab 06/14/17 1450  06/15/17 0320  WBC 9.6 7.3  HGB 11.5* 11.4*  HCT 36.9 36.7  MCV 103.9* 106.1*  PLT 220 188   Basic Metabolic Panel: Recent Labs  Lab 06/14/17 1450 06/15/17 0320  NA 140 142  K 4.7 4.5  CL 103 108  CO2 28 25  GLUCOSE 87 70  BUN 37* 35*  CREATININE 1.62* 1.55*  CALCIUM 9.2 8.8*   GFR: Estimated Creatinine Clearance: 33.9 mL/min (A) (by C-G formula based on SCr of 1.55 mg/dL (H)). Liver Function Tests: Recent Labs  Lab 06/14/17 1450 06/15/17 0320  AST 31 23  ALT 23 20  ALKPHOS 73 66  BILITOT 0.9 0.7  PROT 6.4* 5.7*  ALBUMIN 3.3* 2.9*   Recent Labs  Lab 06/14/17 1450  LIPASE 23   Recent Labs  Lab 06/14/17 1520 06/15/17 1219  AMMONIA 31 37*   Coagulation Profile: No results for input(s): INR, PROTIME in the last 168 hours. Cardiac Enzymes: No results for input(s): CKTOTAL, CKMB, CKMBINDEX, TROPONINI in the last 168 hours. BNP (last 3 results) No results for input(s): PROBNP in the last 8760 hours. HbA1C: No results for input(s): HGBA1C in the last 72 hours. CBG: Recent Labs  Lab 06/15/17 0139 06/15/17 0646  GLUCAP 78 76   Lipid Profile: No results for input(s): CHOL, HDL, LDLCALC, TRIG, CHOLHDL, LDLDIRECT in the last 72 hours. Thyroid Function Tests: Recent Labs    06/14/17 1550 06/15/17 1219  TSH 5.100*  --   FREET4  --  1.05  T3FREE  --  3.6   Anemia Panel: Recent Labs    06/15/17 1219  VITAMINB12 774  FOLATE 53.7  FERRITIN 249  TIBC 258  IRON 35  RETICCTPCT 2.1      Radiology Studies: I have reviewed all of the imaging during this hospital visit personally     Scheduled Meds: . ARIPiprazole  2 mg Oral Daily  . aspirin  81 mg Oral Daily  . azaTHIOprine  100 mg Oral Daily  . buPROPion  150 mg Oral Daily  . carvedilol  6.25 mg Oral BID WC  . cycloSPORINE  1 drop Both Eyes BID  . dorzolamide-timolol  1 drop Both Eyes BID  . enoxaparin (LOVENOX) injection  40 mg Subcutaneous Q24H  . ferrous sulfate  325 mg Oral Q M,W,F  .  isosorbide dinitrate  20 mg Oral TID  . mouth rinse  15 mL Mouth Rinse BID  . pantoprazole  20 mg Oral Daily  . predniSONE  20 mg Oral Daily  . pyridostigmine  30 mg Oral TID   Continuous Infusions: . Immune Globulin 10%       LOS: 0 days        Kareena Arrambide Gerome Apley, MD [facility] Pager [phone]

## 2017-06-17 NOTE — Progress Notes (Signed)
Subjective: Patient reports that she is feeling better  Exam: Vitals:   06/17/17 0800 06/17/17 1131  BP:  (!) 108/45  Pulse: 71 62  Resp: 15 13  Temp:  98.8 F (37.1 C)  SpO2: 95% 93%   Gen: In bed, NAD Resp: non-labored breathing, no acute distress Abd: soft, nt  Neuro: MS: Awake, alert CN: She does have less disconjugate gaze today, but still with mild decreased abduction of the left eye on left gaze, no ptosis on persistent upward gaze Motor: She does have fatigable weakness, 4+/5 strength of neck flexion Sensory: Intact light touch  Impression: 82 year old female who presented initally with what I suspect to be toxic metabolic encephalopathy secondary to multiple sedating medications. She is on the cusp of meeting intubation criteria by NIF and VC, but has good bneck flexion strength. On my exam 5/3, she was slightly dysarthric and has dysconjugate gaze on leftward gaze. I had enough concern about her respiratory numbers that I think the prudent thing to do would be to give her a course of IVIG  Recommendations: 1) continue IVIG day 2 of 5 2) continue Mestinon 30 3 times daily 3) continue NIFs  Roland Rack, MD [facility] [phone]  If 7pm- 7am, please page neurology on call as listed in [place].

## 2017-06-17 NOTE — Progress Notes (Signed)
NIF= -27  VC= .8L with good pt effort. Pt tolerated well, VS normal throughout.

## 2017-06-17 NOTE — Progress Notes (Signed)
NIF =-28cmH2O V = 0.9 L Pt tol well. Pt alert awake and conversing without difficulty.

## 2017-06-18 DIAGNOSIS — E039 Hypothyroidism, unspecified: Secondary | ICD-10-CM

## 2017-06-18 LAB — BASIC METABOLIC PANEL
Anion gap: 9 (ref 5–15)
BUN: 21 mg/dL — AB (ref 6–20)
CALCIUM: 9.1 mg/dL (ref 8.9–10.3)
CO2: 24 mmol/L (ref 22–32)
CREATININE: 1.38 mg/dL — AB (ref 0.44–1.00)
Chloride: 107 mmol/L (ref 101–111)
GFR calc non Af Amer: 35 mL/min — ABNORMAL LOW (ref >60)
GFR, EST AFRICAN AMERICAN: 41 mL/min — AB (ref >60)
Glucose, Bld: 67 mg/dL (ref 65–99)
Potassium: 3.8 mmol/L (ref 3.5–5.1)
Sodium: 140 mmol/L (ref 135–145)

## 2017-06-18 MED ORDER — LOPERAMIDE HCL 2 MG PO CAPS
2.0000 mg | ORAL_CAPSULE | ORAL | Status: DC | PRN
  Administered 2017-06-18 – 2017-06-19 (×2): 2 mg via ORAL
  Filled 2017-06-18 (×2): qty 1

## 2017-06-18 MED ORDER — PANTOPRAZOLE SODIUM 40 MG PO TBEC
40.0000 mg | DELAYED_RELEASE_TABLET | Freq: Two times a day (BID) | ORAL | Status: DC
  Administered 2017-06-18 – 2017-06-19 (×2): 40 mg via ORAL
  Filled 2017-06-18 (×2): qty 1

## 2017-06-18 MED ORDER — SUCRALFATE 1 GM/10ML PO SUSP
1.0000 g | Freq: Three times a day (TID) | ORAL | Status: DC
  Administered 2017-06-18 – 2017-06-19 (×3): 1 g via ORAL
  Filled 2017-06-18 (×4): qty 10

## 2017-06-18 NOTE — Progress Notes (Signed)
Pt alert awake. No distress noted. Pt c/o stomach pain, RN aware. Pt RR 13.  VC = 0.8 L NIF = -40cmH20. Spo2 at 93% on room air.

## 2017-06-18 NOTE — Progress Notes (Signed)
PROGRESS NOTE    Jennifer [name]  PPJ:[id number] DOB: February 01, 1936 DOA: 06/14/2017 PCP: Janie Morning, DO    Brief Narrative:  82 year old female who presented with hypoxemia.  She does have a significant past medical history for myasthenia gravis, nonischemic cardiomyopathy with left ventricle ejection fraction 45 to 50%, chronic kidney disease stage III, chronic pain syndrome.  Patient was found lethargic and hypoxic.  Initial physical examination blood pressure 101/59, heart rate 61, respiratory rate 9-10, oxygen saturation 99% on supplemental oxygen.  She was awake and alert, her lungs were clear to auscultation, heart S1-S2 present and rhythmic, abdomen soft nontender, no lower extremity edema.  Her negative inspiratory force was -32.   Patient was admitted to the hospital with working diagnosis of acute metabolic/ toxic encephalopathy due to acute hypoxic respiratory failure, and sedatives   Assessment & Plan:   Principal Problem:   Acute encephalopathy Active Problems:   Hypothyroidism   Pernicious anemia   Essential hypertension   CKD (chronic kidney disease) stage 3, GFR 30-59 ml/min (HCC)   Nonischemic cardiomyopathy (HCC)   LBBB (left bundle branch block)   Myasthenia gravis ([place])   Acute metabolic encephalopathy   Hyperlipidemia   Pressure injury of skin    1.  Acute toxic/metabolic encephalopathy. Continue IV IG per protocol #3/5/  NIF negative 40 cm H20, Vital Capacity = 0.8 L, improved symptoms. Continue close monitoring in step down unit with telemetry and oxymetry. Follow on renal function.   2.  Acute hypoxic respite failure. Dyspnea has improved continue close oxymetry monitoring, supplemental 02 per . Oxygen saturation 93% on room air.   3.  Acute decompensation of myasthenia gravis. On IV IG, negative inspiratory force 40 cm H20 and vital capacity 0.8 L. Continue pyridostimine, Imuran and prednisone. At home uses wheelchair for ambulation.   4.  Chronic  kidney disease stage III. Renal function continue to be stable with serum cr at 1,38 with K at 3,8 and serum bicarbonate at 24. Follow on renal panel in am. Avoid hypotension.   5.  Hypertension. On isosorbide, and carvedilol, blood pressure controlled.    6.  Hypothyroidism. On levothyroxine.   7. New dyspepsia and diarrhea. Will increase proton pump inhibitors to bid, add sucralfate with meals and as needed lomotil for diarrhea. Continue to encourage po intake.    DVT prophylaxis: enoxaparin   Code Status: full  Family Communication: no family at the bedside  Disposition Plan: home when completed IV IG    Consultants:   Neurology   Procedures:     Antimicrobials:    Subjective: Patient complains of abdominal pain, moderate to severe in intensity, associated with nausea, worse with meals, no radiation. Positive profuse diarrhea.   Objective: Vitals:   06/18/17 0308 06/18/17 0758 06/18/17 0918 06/18/17 1043  BP: (!) 153/94 (!) 159/79  (!) 155/73  Pulse: 80 77 77 70  Resp: 16 18 14 15   Temp: 99 F (37.2 C) 99.1 F (37.3 C)  99.4 F (37.4 C)  TempSrc: Oral Oral  Oral  SpO2: 92% 98% 93% 93%  Weight: 98.9 kg (218 lb 0.6 oz)     Height:        Intake/Output Summary (Last 24 hours) at 06/18/2017 1335 Last data filed at 06/18/2017 1300 Gross per 24 hour  Intake 680 ml  Output 1225 ml  Net -545 ml   Filed Weights   06/16/17 0500 06/17/17 0348 06/18/17 0308  Weight: 102.2 kg (225 lb 5 oz) 100.5 kg (221  lb 9 oz) 98.9 kg (218 lb 0.6 oz)    Examination:   General: Not in pain or dyspnea, deconditioned Neurology: Awake and alert, non focal  E ENT: mild pallor, no icterus, oral mucosa moist Cardiovascular: No JVD. S1-S2 present, rhythmic, no gallops, rubs, or murmurs. Trace non pitting lower extremity edema. Pulmonary: decreased breath sounds bilaterally at bases, adequate air movement, no wheezing, rhonchi or rales. Gastrointestinal. Abdomen protuberant with  no organomegaly, non tender, no rebound or guarding Skin. No rashes Musculoskeletal: no joint deformities     Data Reviewed: I have personally reviewed following labs and imaging studies  CBC: Recent Labs  Lab 06/14/17 1450 06/15/17 0320  WBC 9.6 7.3  HGB 11.5* 11.4*  HCT 36.9 36.7  MCV 103.9* 106.1*  PLT 220 989   Basic Metabolic Panel: Recent Labs  Lab 06/14/17 1450 06/15/17 0320 06/17/17 1021 06/18/17 0243  NA 140 142 139 140  K 4.7 4.5 4.0 3.8  CL 103 108 108 107  CO2 28 25 24 24   GLUCOSE 87 70 96 67  BUN 37* 35* 23* 21*  CREATININE 1.62* 1.55* 1.37* 1.38*  CALCIUM 9.2 8.8* 9.2 9.1   GFR: Estimated Creatinine Clearance: 37.8 mL/min (A) (by C-G formula based on SCr of 1.38 mg/dL (H)). Liver Function Tests: Recent Labs  Lab 06/14/17 1450 06/15/17 0320  AST 31 23  ALT 23 20  ALKPHOS 73 66  BILITOT 0.9 0.7  PROT 6.4* 5.7*  ALBUMIN 3.3* 2.9*   Recent Labs  Lab 06/14/17 1450  LIPASE 23   Recent Labs  Lab 06/14/17 1520 06/15/17 1219  AMMONIA 31 37*   Coagulation Profile: No results for input(s): INR, PROTIME in the last 168 hours. Cardiac Enzymes: No results for input(s): CKTOTAL, CKMB, CKMBINDEX, TROPONINI in the last 168 hours. BNP (last 3 results) No results for input(s): PROBNP in the last 8760 hours. HbA1C: No results for input(s): HGBA1C in the last 72 hours. CBG: Recent Labs  Lab 06/15/17 0139 06/15/17 0646  GLUCAP 78 76   Lipid Profile: No results for input(s): CHOL, HDL, LDLCALC, TRIG, CHOLHDL, LDLDIRECT in the last 72 hours. Thyroid Function Tests: No results for input(s): TSH, T4TOTAL, FREET4, T3FREE, THYROIDAB in the last 72 hours. Anemia Panel: No results for input(s): VITAMINB12, FOLATE, FERRITIN, TIBC, IRON, RETICCTPCT in the last 72 hours.    Radiology Studies: I have reviewed all of the imaging during this hospital visit personally     Scheduled Meds: . ARIPiprazole  2 mg Oral Daily  . aspirin  81 mg Oral Daily   . azaTHIOprine  100 mg Oral Daily  . buPROPion  150 mg Oral Daily  . carvedilol  6.25 mg Oral BID WC  . cycloSPORINE  1 drop Both Eyes BID  . dorzolamide-timolol  1 drop Both Eyes BID  . enoxaparin (LOVENOX) injection  40 mg Subcutaneous Q24H  . ferrous sulfate  325 mg Oral Q M,W,F  . isosorbide dinitrate  20 mg Oral TID  . mouth rinse  15 mL Mouth Rinse BID  . pantoprazole  20 mg Oral Daily  . predniSONE  20 mg Oral Daily  . pyridostigmine  30 mg Oral TID   Continuous Infusions: . Immune Globulin 10% 120 mL/hr at 06/18/17 1130     LOS: 1 day        Tawni Millers, MD [facility] Pager [phone]

## 2017-06-18 NOTE — Progress Notes (Signed)
VC 0.9L NIF -38  Patient has good effort.

## 2017-06-18 NOTE — Progress Notes (Signed)
Subjective: No changes  Exam: Vitals:   06/18/17 0308 06/18/17 0758  BP: (!) 153/94 (!) 159/79  Pulse: 80 77  Resp: 16 18  Temp: 99 F (37.2 C) 99.1 F (37.3 C)  SpO2: 92% 98%   Gen: In bed, NAD Resp: non-labored breathing, no acute distress Abd: soft, nt  Neuro: MS: Awake, alert CN: Slight dysconjugate gaze on leftward gaze(left eye doe snot fully abduct), no ptosis on persistent upward gaze. She has good palatal elevation. Mild dysarthria.  Motor: She does have fatigable weakness in her arms 5/5 initallly worsening to 4/5 fairly quickly, 4+/5 strength of neck flexion Sensory: Intact light touch  Impression: 82 year old female who presented initally with what I suspect to be toxic metabolic encephalopathy secondary to multiple sedating medications. She is on the cusp of meeting intubation criteria by NIF and VC, but has good neck flexion strength. On my exam 5/3, she was slightly dysarthric and has dysconjugate gaze on leftward gaze. I had enough concern about her respiratory numbers that I think the prudent thing to do would be to give her a course of IVIG, though I am not certain that this is not more of a chronic state of affairs. I do not think I would be aggressive with prednisone unless she has a very marked clinical response to IVIG.   Recommendations: 1) continue IVIG day 3 of 5 2) continue Mestinon 30 3 times daily 3) continue NIFs 4) continue prednisone 20mg  daily, continue immuran 100mg  daily.  5) will follow.   Roland Rack, MD [facility] [phone]  If 7pm- 7am, please page neurology on call as listed in [place].

## 2017-06-18 NOTE — Progress Notes (Signed)
Pt c/o abdominal pain as soon as she starts to eat. Intake less than 10% and 150 cc fluid in 12 hr. See flow sheet for pain and nausea management.

## 2017-06-19 LAB — BASIC METABOLIC PANEL
Anion gap: 9 (ref 5–15)
BUN: 20 mg/dL (ref 6–20)
CHLORIDE: 105 mmol/L (ref 101–111)
CO2: 24 mmol/L (ref 22–32)
Calcium: 8.8 mg/dL — ABNORMAL LOW (ref 8.9–10.3)
Creatinine, Ser: 1.41 mg/dL — ABNORMAL HIGH (ref 0.44–1.00)
GFR calc Af Amer: 40 mL/min — ABNORMAL LOW (ref >60)
GFR calc non Af Amer: 34 mL/min — ABNORMAL LOW (ref >60)
Glucose, Bld: 74 mg/dL (ref 65–99)
POTASSIUM: 3.4 mmol/L — AB (ref 3.5–5.1)
SODIUM: 138 mmol/L (ref 135–145)

## 2017-06-19 LAB — CBC WITH DIFFERENTIAL/PLATELET
Basophils Absolute: 0 10*3/uL (ref 0.0–0.1)
Basophils Relative: 0 %
EOS ABS: 0 10*3/uL (ref 0.0–0.7)
Eosinophils Relative: 0 %
HEMATOCRIT: 33.2 % — AB (ref 36.0–46.0)
HEMOGLOBIN: 10.5 g/dL — AB (ref 12.0–15.0)
LYMPHS ABS: 1.7 10*3/uL (ref 0.7–4.0)
LYMPHS PCT: 26 %
MCH: 31.9 pg (ref 26.0–34.0)
MCHC: 31.6 g/dL (ref 30.0–36.0)
MCV: 100.9 fL — ABNORMAL HIGH (ref 78.0–100.0)
Monocytes Absolute: 0.4 10*3/uL (ref 0.1–1.0)
Monocytes Relative: 6 %
NEUTROS ABS: 4.2 10*3/uL (ref 1.7–7.7)
NEUTROS PCT: 68 %
Platelets: 198 10*3/uL (ref 150–400)
RBC: 3.29 MIL/uL — AB (ref 3.87–5.11)
RDW: 14.1 % (ref 11.5–15.5)
WBC: 6.3 10*3/uL (ref 4.0–10.5)

## 2017-06-19 LAB — URINALYSIS, ROUTINE W REFLEX MICROSCOPIC
BILIRUBIN URINE: NEGATIVE
Glucose, UA: NEGATIVE mg/dL
Ketones, ur: 20 mg/dL — AB
Leukocytes, UA: NEGATIVE
NITRITE: POSITIVE — AB
PROTEIN: 30 mg/dL — AB
Specific Gravity, Urine: 1.018 (ref 1.005–1.030)
pH: 6 (ref 5.0–8.0)

## 2017-06-19 LAB — GLUCOSE, CAPILLARY: Glucose-Capillary: 83 mg/dL (ref 65–99)

## 2017-06-19 LAB — CULTURE, BLOOD (ROUTINE X 2)
Culture: NO GROWTH
Special Requests: ADEQUATE

## 2017-06-19 MED ORDER — CARVEDILOL 12.5 MG PO TABS
12.5000 mg | ORAL_TABLET | Freq: Two times a day (BID) | ORAL | Status: DC
  Administered 2017-06-19 – 2017-06-24 (×10): 12.5 mg via ORAL
  Filled 2017-06-19 (×10): qty 1

## 2017-06-19 MED ORDER — GERHARDT'S BUTT CREAM
TOPICAL_CREAM | Freq: Every day | CUTANEOUS | Status: DC | PRN
  Administered 2017-06-20 – 2017-06-22 (×2): 1 via TOPICAL
  Filled 2017-06-19 (×3): qty 1

## 2017-06-19 MED ORDER — PANTOPRAZOLE SODIUM 40 MG PO TBEC
40.0000 mg | DELAYED_RELEASE_TABLET | Freq: Every day | ORAL | Status: DC
  Administered 2017-06-20 – 2017-06-24 (×5): 40 mg via ORAL
  Filled 2017-06-19 (×5): qty 1

## 2017-06-19 MED ORDER — POTASSIUM CHLORIDE CRYS ER 20 MEQ PO TBCR
40.0000 meq | EXTENDED_RELEASE_TABLET | Freq: Once | ORAL | Status: AC
  Administered 2017-06-19: 40 meq via ORAL
  Filled 2017-06-19: qty 2

## 2017-06-19 MED ORDER — LOPERAMIDE HCL 2 MG PO CAPS
2.0000 mg | ORAL_CAPSULE | ORAL | Status: DC | PRN
  Administered 2017-06-19 – 2017-06-21 (×2): 2 mg via ORAL
  Filled 2017-06-19 (×2): qty 1

## 2017-06-19 NOTE — Progress Notes (Signed)
RT did NIF/VC with patient. Patient got NIF -30, and VC 750. Patient tolerating well.

## 2017-06-19 NOTE — Progress Notes (Signed)
[place] TEAM 1 - Stepdown/ICU TEAM  BRANDIS WIXTED  ZOX:[id number] DOB: 01/25/1936 DOA: 06/14/2017 PCP: Janie Morning, DO    Brief Narrative:  82 year old female w/ a history of myasthenia gravis, nonischemic cardiomyopathy with left ventricle ejection fraction 45 to 50%, CKD stage III, and chronic pain syndrome who presented lethargic and hypoxic. She was felt to be suffering w/ TME due to sedating medications.  Once her MS began to improve she was found to also be suffering a possible myasthenia crisis.    Significant Events: 5/1 admit   Subjective: C/o ongoing low midline abdom cramping pains and diarrhea.  Denies cp or sob.  Feels her strength is improving.    Assessment & Plan:  Acute toxic/metabolic encephalopathy Likely related to sedating medications (lorazepam trazodone melatonin hydrocodone fentanyl transdermal and neurontin) - mental status appears to have returned to normal   Acute decompensation of myasthenia gravis IVIG per Neurology - continue pyridostimine, Imuran and prednisone - at home uses wheelchair for ambulation  Acute hypoxic resp failure Likely a combination of sedation as well as MG - resolved w/ sats now normal on RA  CKD stage III Renal fxn is stable - follow trend - baseline crt ~1.5  Chronic mild systolic CHF last EF measured was 45 to 50% - so signif overload on exam  Hypertension BP reasonably controlled at this time   Hypothyroidism Cont levothyroxine - TSH elevated but FT4 and FT3 normal - suggest recheck as outpt when not acutely ill   Mild hypokalemia  Due to poor intake - replace and follow - f/u Mg  Chronic pain On fentanyl patch   Abdom pain - Diarrhea Check UA to r/u UTI - imodium prn -   DVT prophylaxis: lovenox  Code Status: FULL CODE Family Communication: no family present at time of exam  Disposition Plan:   Consultants:  Neurology   Antimicrobials:  none  Objective: Blood pressure 136/78, pulse 81,  temperature 98.9 F (37.2 C), temperature source Oral, resp. rate 18, height 5\' 4"  (1.626 m), weight 99.1 kg (218 lb 7.6 oz), SpO2 90 %.  Intake/Output Summary (Last 24 hours) at 06/19/2017 1324 Last data filed at 06/18/2017 1800 Gross per 24 hour  Intake 470 ml  Output 300 ml  Net 170 ml   Filed Weights   06/17/17 0348 06/18/17 0308 06/19/17 0453  Weight: 100.5 kg (221 lb 9 oz) 98.9 kg (218 lb 0.6 oz) 99.1 kg (218 lb 7.6 oz)    Examination: General: No acute respiratory distress Lungs: Clear to auscultation bilaterally without wheezes or crackles Cardiovascular: Regular rate and rhythm without murmur gallop or rub normal S1 and S2 Abdomen: Nontender, nondistended, soft, bowel sounds positive, no rebound, no ascites, no appreciable mass Extremities: trace B LE edema   CBC: Recent Labs  Lab 06/14/17 1450 06/15/17 0320 06/19/17 0236  WBC 9.6 7.3 6.3  NEUTROABS  --   --  4.2  HGB 11.5* 11.4* 10.5*  HCT 36.9 36.7 33.2*  MCV 103.9* 106.1* 100.9*  PLT 220 176 811   Basic Metabolic Panel: Recent Labs  Lab 06/17/17 1021 06/18/17 0243 06/19/17 0236  NA 139 140 138  K 4.0 3.8 3.4*  CL 108 107 105  CO2 24 24 24   GLUCOSE 96 67 74  BUN 23* 21* 20  CREATININE 1.37* 1.38* 1.41*  CALCIUM 9.2 9.1 8.8*   GFR: Estimated Creatinine Clearance: 37 mL/min (A) (by C-G formula based on SCr of 1.41 mg/dL (H)).  Liver Function Tests: Recent  Labs  Lab 06/14/17 1450 06/15/17 0320  AST 31 23  ALT 23 20  ALKPHOS 73 66  BILITOT 0.9 0.7  PROT 6.4* 5.7*  ALBUMIN 3.3* 2.9*   Recent Labs  Lab 06/14/17 1450  LIPASE 23   Recent Labs  Lab 06/14/17 1520 06/15/17 1219  AMMONIA 31 37*    HbA1C: Hgb A1c MFr Bld  Date/Time Value Ref Range Status  06/16/2016 07:34 PM 4.9 4.8 - 5.6 % Final    Comment:    (NOTE)         Pre-diabetes: 5.7 - 6.4         Diabetes: >6.4         Glycemic control for adults with diabetes: <7.0     CBG: Recent Labs  Lab 06/15/17 0139 06/15/17 0646    GLUCAP 78 76    Recent Results (from the past 240 hour(s))  Blood culture (routine x 2)     Status: None   Collection Time: 06/14/17  3:50 PM  Result Value Ref Range Status   Specimen Description BLOOD LEFT ANTECUBITAL  Final   Special Requests   Final    BOTTLES DRAWN AEROBIC AND ANAEROBIC Blood Culture adequate volume   Culture   Final    NO GROWTH 5 DAYS Performed at [facility], [place] [address], [place], [place] [address]    Report Status 06/19/2017 FINAL  Final  Urine culture     Status: None   Collection Time: 06/14/17  8:39 PM  Result Value Ref Range Status   Specimen Description URINE, CATHETERIZED  Final   Special Requests NONE  Final   Culture   Final    NO GROWTH Performed at [facility], [place] [address], [place], [place] [address]    Report Status 06/16/2017 FINAL  Final  MRSA PCR Screening     Status: None   Collection Time: 06/15/17  7:09 PM  Result Value Ref Range Status   MRSA by PCR NEGATIVE NEGATIVE Final    Comment:        The GeneXpert MRSA Assay (FDA approved for NASAL specimens only), is one component of a comprehensive MRSA colonization surveillance program. It is not intended to diagnose MRSA infection nor to guide or monitor treatment for MRSA infections. Performed at [facility], [place] [address], [place], [place] [address]      Scheduled Meds: . ARIPiprazole  2 mg Oral Daily  . aspirin  81 mg Oral Daily  . azaTHIOprine  100 mg Oral Daily  . buPROPion  150 mg Oral Daily  . carvedilol  6.25 mg Oral BID WC  . cycloSPORINE  1 drop Both Eyes BID  . dorzolamide-timolol  1 drop Both Eyes BID  . enoxaparin (LOVENOX) injection  40 mg Subcutaneous Q24H  . ferrous sulfate  325 mg Oral Q M,W,F  . isosorbide dinitrate  20 mg Oral TID  . mouth rinse  15 mL Mouth Rinse BID  . pantoprazole  40 mg Oral BID AC  . predniSONE  20 mg Oral Daily  . pyridostigmine  30 mg Oral TID  . sucralfate  1 g Oral TID WC & HS   Continuous  Infusions: . Immune Globulin 10% 238 mL/hr at 06/19/17 1258     LOS: 2 days   Cherene Altes, MD [facility] Office  [phone] Pager - Text Page per Shea Evans as per below:  On-Call/Text Page:      [email or web address]  password [email or web address]  If 7PM-7AM, please contact night-coverage [email or web address] Password [email or web address] 06/19/2017, 1:24 PM

## 2017-06-19 NOTE — Progress Notes (Signed)
Pt alert awake, no distress noted. RR 18 HR 80 SpO2 at 98% on room air.  VC = 0.8 L NIF = -40 cmH20.  Pt with good effort.

## 2017-06-19 NOTE — NC FL2 (Signed)
[place] MEDICAID FL2 LEVEL OF CARE SCREENING TOOL     IDENTIFICATION  Patient Name: Jennifer [name] Birthdate: 01/11/1936 Sex: female Admission Date (Current Location): 06/14/2017  [facility] and [place] Number:  Herbalist and Address:  [facility] [place], [place] [address] [place], [place] [address]      Provider Number: [id number]  Attending Physician Name and Address:  Cherene Altes, MD  Relative Name and Phone Number:       Current Level of Care: Hospital Recommended Level of Care: [place] Prior Approval Number:    Date Approved/Denied:   PASRR Number: [id number]  Discharge Plan: SNF    Current Diagnoses: Patient Active Problem List   Diagnosis Date Noted  . Pressure injury of skin 06/16/2017  . Acute encephalopathy 06/15/2017  . Acute metabolic encephalopathy 43/88/8757  . Hyperlipidemia 06/15/2017  . Myasthenia gravis ([place]) 12/09/2016  . Debilitated patient 06/18/2016  . Chest pain at rest 06/16/2016  . LBBB (left bundle branch block)   . Lower extremity edema 01/25/2014  . Patellar tendon rupture 07/12/2013  . Constipation 07/02/2013  . Knee pain 06/28/2013  . Acute posthemorrhagic anemia 06/28/2013  . Senile dementia, uncomplicated 97/28/2060  . Right Mechanical complication of knee prosthesis 06/19/2013  . Failure of total knee arthroplasty ([place]) 06/19/2013  . Paget's disease of bone 11/29/2012  . Nausea alone 11/29/2012  . GERD (gastroesophageal reflux disease) 11/29/2012  . Nonischemic cardiomyopathy ([place]) 11/29/2012  . Anemia 11/29/2012  . Depression 11/29/2012  . Abdominal pain, epigastric 11/28/2012  . UTERINE FIBROID 04/13/2006  . Hypothyroidism 04/13/2006  . HYPOPARATHYROIDISM 04/13/2006  . OBESITY, NOS 04/13/2006  . Pernicious anemia 04/13/2006  . ANXIETY 04/13/2006  . DEPRESSIVE DISORDER, NOS 04/13/2006  . GLAUCOMA 04/13/2006  . Essential hypertension 04/13/2006  . GASTROESOPHAGEAL  REFLUX, NO ESOPHAGITIS 04/13/2006  . CKD (chronic kidney disease) stage 3, GFR 30-59 ml/min (HCC) 04/13/2006  . OSTEOARTHRITIS, LOWER LEG 04/13/2006  . ROTATOR CUFF TENDONITIS 04/13/2006  . OSTEOPOROSIS, UNSPECIFIED 04/13/2006  . COSTOCHONDRITIS 04/13/2006  . INSOMNIA NOS 04/13/2006  . PAIN, GENERALIZED 04/13/2006  . INCONTINENCE, URGE 04/13/2006    Orientation RESPIRATION BLADDER Height & Weight     Self, Time, Situation, Place  Normal Continent, External catheter Weight: 218 lb 7.6 oz (99.1 kg) Height:  5\' 4"  (162.6 cm)  BEHAVIORAL SYMPTOMS/MOOD NEUROLOGICAL BOWEL NUTRITION STATUS  (None) (None) Continent Diet(Heart healthy with fluid restriction 1200 mL.)  AMBULATORY STATUS COMMUNICATION OF NEEDS Skin     Verbally PU Stage and Appropriate Care   PU Stage 2 Dressing: (Mid lower anus: Foam prn.)                   Personal Care Assistance Level of Assistance              Functional Limitations Info  Sight, Hearing, Speech Sight Info: Adequate Hearing Info: Adequate Speech Info: Adequate    SPECIAL CARE FACTORS FREQUENCY  Blood pressure                    Contractures Contractures Info: Not present    Additional Factors Info  Code Status, Allergies, Psychotropic Code Status Info: Full Allergies Info: Codeine Psychotropic Info: Anxiety, depression: Abilify 2 mg PO daily, Wellbutrin SR 150 mg PO daily.         Current Medications (06/19/2017):  This is the current hospital active medication list Current Facility-Administered Medications  Medication Dose Route Frequency Provider Last Rate Last Dose  .  acetaminophen (TYLENOL) tablet 650 mg  650 mg Oral Q6H PRN Rise Patience, MD   650 mg at 06/18/17 1815  . ARIPiprazole (ABILIFY) tablet 2 mg  2 mg Oral Daily Rise Patience, MD   2 mg at 06/19/17 0901  . aspirin chewable tablet 81 mg  81 mg Oral Daily Rise Patience, MD   81 mg at 06/19/17 0900  . azaTHIOprine (IMURAN) tablet 100 mg  100  mg Oral Daily Rise Patience, MD   100 mg at 06/19/17 0901  . buPROPion (WELLBUTRIN SR) 12 hr tablet 150 mg  150 mg Oral Daily Rise Patience, MD   150 mg at 06/19/17 0900  . carvedilol (COREG) tablet 12.5 mg  12.5 mg Oral BID WC Joette Catching T, MD      . cycloSPORINE (RESTASIS) 0.05 % ophthalmic emulsion 1 drop  1 drop Both Eyes BID Rise Patience, MD   1 drop at 06/19/17 0902  . dorzolamide-timolol (COSOPT) 22.3-6.8 MG/ML ophthalmic solution 1 drop  1 drop Both Eyes BID Rise Patience, MD   1 drop at 06/19/17 2297  . enoxaparin (LOVENOX) injection 40 mg  40 mg Subcutaneous Q24H Rise Patience, MD   40 mg at 06/19/17 0859  . fentaNYL (SUBLIMAZE) injection 12.5 mcg  12.5 mcg Intravenous Q2H PRN Bodenheimer, Charles A, NP   12.5 mcg at 06/19/17 0932  . ferrous sulfate tablet 325 mg  325 mg Oral Q M,W,F Rise Patience, MD   325 mg at 06/19/17 0900  . Gerhardt's butt cream   Topical Daily PRN Joette Catching T, MD      . hydrALAZINE (APRESOLINE) injection 10 mg  10 mg Intravenous Q4H PRN Rise Patience, MD   10 mg at 06/19/17 1529  . Immune Globulin 10% (PRIVIGEN) IV infusion 40 g  400 mg/kg Intravenous Q24H Skipper Cliche A, MD 238 mL/hr at 06/19/17 1258    . isosorbide dinitrate (ISORDIL) tablet 20 mg  20 mg Oral TID Skipper Cliche A, MD   20 mg at 06/19/17 1500  . loperamide (IMODIUM) capsule 2 mg  2 mg Oral PRN Cherene Altes, MD      . MEDLINE mouth rinse  15 mL Mouth Rinse BID Rise Patience, MD   15 mL at 06/19/17 0903  . ondansetron (ZOFRAN) tablet 4 mg  4 mg Oral Q6H PRN Rise Patience, MD   4 mg at 06/17/17 9892   Or  . ondansetron [facility]) injection 4 mg  4 mg Intravenous Q6H PRN Rise Patience, MD   4 mg at 06/19/17 0553  . [START ON 06/20/2017] pantoprazole (PROTONIX) EC tablet 40 mg  40 mg Oral Daily Joette Catching T, MD      . polyethylene glycol (MIRALAX / GLYCOLAX) packet 17 g  17 g Oral Daily PRN Rise Patience, MD      . predniSONE (DELTASONE) tablet 20 mg  20 mg Oral Daily Rise Patience, MD   20 mg at 06/19/17 0900  . pyridostigmine (MESTINON) tablet 30 mg  30 mg Oral TID Rise Patience, MD   30 mg at 06/19/17 1500     Discharge Medications: Please see discharge summary for a list of discharge medications.  Relevant Imaging Results:  Relevant Lab Results:   Additional Information SS#: [id number]  Candie Chroman, LCSW

## 2017-06-19 NOTE — Clinical Social Work Note (Signed)
Clinical Social Work Assessment  Patient Details  Name: Jennifer [name] MRN: [id number] Date of Birth: [birth year]  Date of referral:  06/19/17               Reason for consult:  Discharge Planning                Permission sought to share information with:  Chartered certified accountant granted to share information::  Yes, Verbal Permission Granted  Name::        Agency::  [facility]  Relationship::     Contact Information:     Housing/Transportation Living arrangements for the past 2 months:  [place] of Information:  Patient, Scientist, water quality, Facility Patient Interpreter Needed:  None Criminal Activity/Legal Involvement Pertinent to Current Situation/Hospitalization:  No - Comment as needed Significant Relationships:  Adult Children Lives with:  Facility Resident Do you feel safe going back to the place where you live?  Yes Need for family participation in patient care:  Yes (Comment)  Care giving concerns:  Patient is a long-term resident at [facility].   Social Worker assessment / plan:  CSW met with patient. No supports at bedside. CSW introduced role and explained that discharge planning would be discussed. Patient confirmed she is a long-term resident at [facility] and plans to return at discharge. Patient states she has been there since last May. No further concerns. CSW encouraged patient to contact CSW as needed. CSW will continue to follow patient for support and facilitate discharge back to SNF once medically stable.  Employment status:  Retired Nurse, adult PT Recommendations:  Not assessed at this time Information / Referral to community resources:  [place]  Patient/Family's Response to care:  Patient agreeable to return to SNF. Patient's children supportive and involved in patient's care. Patient appreciated social work intervention.  Patient/Family's Understanding of and  Emotional Response to Diagnosis, Current Treatment, and Prognosis:  Patient has a good understanding of the reason for admission and plan to return to SNF at discharge. Patient appears happy with hospital care.  Emotional Assessment Appearance:  Appears stated age Attitude/Demeanor/Rapport:  Engaged, Gracious Affect (typically observed):  Accepting, Appropriate, Calm, Pleasant Orientation:  Oriented to Self, Oriented to Place, Oriented to  Time, Oriented to Situation Alcohol / Substance use:  Never Used Psych involvement (Current and /or in the community):  No (Comment)  Discharge Needs  Concerns to be addressed:  Care Coordination Readmission within the last 30 days:  No Current discharge risk:  None Barriers to Discharge:  Continued Medical Work up   Candie Chroman, LCSW 06/19/2017, 4:10 PM

## 2017-06-20 LAB — COMPREHENSIVE METABOLIC PANEL WITH GFR
ALT: 15 U/L (ref 14–54)
AST: 24 U/L (ref 15–41)
Albumin: 2.3 g/dL — ABNORMAL LOW (ref 3.5–5.0)
Alkaline Phosphatase: 45 U/L (ref 38–126)
Anion gap: 8 (ref 5–15)
BUN: 29 mg/dL — ABNORMAL HIGH (ref 6–20)
CO2: 22 mmol/L (ref 22–32)
Calcium: 8.6 mg/dL — ABNORMAL LOW (ref 8.9–10.3)
Chloride: 109 mmol/L (ref 101–111)
Creatinine, Ser: 1.59 mg/dL — ABNORMAL HIGH (ref 0.44–1.00)
GFR calc Af Amer: 35 mL/min — ABNORMAL LOW
GFR calc non Af Amer: 30 mL/min — ABNORMAL LOW
Glucose, Bld: 75 mg/dL (ref 65–99)
Potassium: 3.8 mmol/L (ref 3.5–5.1)
Sodium: 139 mmol/L (ref 135–145)
Total Bilirubin: 1 mg/dL (ref 0.3–1.2)
Total Protein: 8 g/dL (ref 6.5–8.1)

## 2017-06-20 LAB — CBC
HCT: 34.2 % — ABNORMAL LOW (ref 36.0–46.0)
Hemoglobin: 10.9 g/dL — ABNORMAL LOW (ref 12.0–15.0)
MCH: 32.2 pg (ref 26.0–34.0)
MCHC: 31.9 g/dL (ref 30.0–36.0)
MCV: 100.9 fL — ABNORMAL HIGH (ref 78.0–100.0)
Platelets: 203 K/uL (ref 150–400)
RBC: 3.39 MIL/uL — ABNORMAL LOW (ref 3.87–5.11)
RDW: 14.5 % (ref 11.5–15.5)
WBC: 7.8 K/uL (ref 4.0–10.5)

## 2017-06-20 LAB — URINE CULTURE

## 2017-06-20 LAB — MAGNESIUM: MAGNESIUM: 1.8 mg/dL (ref 1.7–2.4)

## 2017-06-20 MED ORDER — SODIUM CHLORIDE 0.9 % IV SOLN
INTRAVENOUS | Status: DC
  Administered 2017-06-20: 12:00:00 via INTRAVENOUS
  Administered 2017-06-24: 60 mL/h via INTRAVENOUS

## 2017-06-20 MED ORDER — DICYCLOMINE HCL 10 MG PO CAPS
10.0000 mg | ORAL_CAPSULE | Freq: Three times a day (TID) | ORAL | Status: DC
  Administered 2017-06-20 – 2017-06-23 (×10): 10 mg via ORAL
  Filled 2017-06-20 (×12): qty 1

## 2017-06-20 MED ORDER — ISOSORBIDE DINITRATE 10 MG PO TABS
30.0000 mg | ORAL_TABLET | Freq: Three times a day (TID) | ORAL | Status: DC
  Administered 2017-06-20 – 2017-06-24 (×12): 30 mg via ORAL
  Filled 2017-06-20 (×12): qty 3

## 2017-06-20 MED ORDER — ZOLPIDEM TARTRATE 5 MG PO TABS
5.0000 mg | ORAL_TABLET | Freq: Every day | ORAL | Status: DC
  Administered 2017-06-20 – 2017-06-23 (×4): 5 mg via ORAL
  Filled 2017-06-20 (×4): qty 1

## 2017-06-20 NOTE — Progress Notes (Signed)
NIF -32 VC .7L with moderate pt effort

## 2017-06-20 NOTE — Progress Notes (Signed)
[place] TEAM 1 - Stepdown/ICU TEAM  BILL MCVEY  ZTI:[id number] DOB: [birth year] DOA: 06/14/2017 PCP: Janie Morning, DO    Brief Narrative:  82 year old female w/ a history of myasthenia gravis, nonischemic cardiomyopathy with left ventricle ejection fraction 45 to 50%, CKD stage III, and chronic pain syndrome who presented lethargic and hypoxic. She was felt to be suffering w/ TME due to sedating medications.  Once her MS began to improve she was found to also be suffering a possible myasthenia crisis.    Significant Events: 5/1 admit   Subjective: Pt feels she is becoming stronger.  She remains alert and conversant.  She has not had any diarrhea today, but does c/o some crampy stomach discomfort.  She denies sob, vomiting, or chest pain.    Assessment & Plan:  Acute toxic/metabolic encephalopathy Likely related to sedating medications (lorazepam trazodone melatonin hydrocodone fentanyl transdermal and neurontin) - mental status appears to have returned to normal - avoiding resumption of sedatives   Acute decompensation of myasthenia gravis IVIG per Neurology w/ last dose being infused today - continue pyridostimine, Imuran and prednisone - at home uses wheelchair for ambulation - PT/OT to see   Acute hypoxic resp failure Likely a combination of sedation as well as MG - resolved w/ sats now normal on RA  AKI on CKD stage III Renal fxn is worsening - hydrated w/ IVF last 24hrs w/o improvement - follow trend - baseline crt ~1.5 - hopefully crt is now reaching a plateau   Recent Labs  Lab 06/15/17 0320 06/17/17 1021 06/18/17 0243 06/19/17 0236 06/20/17 0223  CREATININE 1.55* 1.37* 1.38* 1.41* 1.59*    Chronic mild systolic CHF last EF measured was 45 to 50% - so signif overload on exam  Filed Weights   06/18/17 0308 06/19/17 0453 06/20/17 0506  Weight: 98.9 kg (218 lb 0.6 oz) 99.1 kg (218 lb 7.6 oz) 97.9 kg (215 lb 13.3 oz)    Hypertension BP climbing - adjust tx  today and follow   Hypothyroidism Cont levothyroxine - TSH elevated but FT4 and FT3 normal - suggest recheck as outpt when not acutely ill   Mild hypokalemia  Due to poor intake - corrected - Mg is normal   Chronic pain On fentanyl patch - no complaints of acute pain presently   Abdom pain - Diarrhea UA suggestive of poor collection/not helpful - imodium prn - add bentyl and follow - diarrhea appears to have resolved   DVT prophylaxis: lovenox  Code Status: FULL CODE Family Communication: no family present at time of exam  Disposition Plan: SDU - begin PT/OT   Consultants:  Neurology   Antimicrobials:  none  Objective: Blood pressure (!) 152/67, pulse 63, temperature 98.9 F (37.2 C), temperature source Oral, resp. rate 19, height 5\' 4"  (1.626 m), weight 97.9 kg (215 lb 13.3 oz), SpO2 98 %.  Intake/Output Summary (Last 24 hours) at 06/20/2017 1152 Last data filed at 06/20/2017 0926 Gross per 24 hour  Intake 728 ml  Output 400 ml  Net 328 ml   Filed Weights   06/18/17 0308 06/19/17 0453 06/20/17 0506  Weight: 98.9 kg (218 lb 0.6 oz) 99.1 kg (218 lb 7.6 oz) 97.9 kg (215 lb 13.3 oz)    Examination: General: No acute respiratory distress - alert and oriented  Lungs: Clear to auscultation bilaterally - no wheezing  Cardiovascular: RRR - no M or rub  Abdomen: Nontender, nondistended, soft, bowel sounds positive, no rebound Extremities: trace B LE edema  w/o signif change   CBC: Recent Labs  Lab 06/15/17 0320 06/19/17 0236 06/20/17 0223  WBC 7.3 6.3 7.8  NEUTROABS  --  4.2  --   HGB 11.4* 10.5* 10.9*  HCT 36.7 33.2* 34.2*  MCV 106.1* 100.9* 100.9*  PLT 176 198 025   Basic Metabolic Panel: Recent Labs  Lab 06/18/17 0243 06/19/17 0236 06/20/17 0223  NA 140 138 139  K 3.8 3.4* 3.8  CL 107 105 109  CO2 24 24 22   GLUCOSE 67 74 75  BUN 21* 20 29*  CREATININE 1.38* 1.41* 1.59*  CALCIUM 9.1 8.8* 8.6*  MG  --   --  1.8   GFR: Estimated Creatinine Clearance:  32.6 mL/min (A) (by C-G formula based on SCr of 1.59 mg/dL (H)).  Liver Function Tests: Recent Labs  Lab 06/14/17 1450 06/15/17 0320 06/20/17 0223  AST 31 23 24   ALT 23 20 15   ALKPHOS 73 66 45  BILITOT 0.9 0.7 1.0  PROT 6.4* 5.7* 8.0  ALBUMIN 3.3* 2.9* 2.3*   Recent Labs  Lab 06/14/17 1450  LIPASE 23   Recent Labs  Lab 06/14/17 1520 06/15/17 1219  AMMONIA 31 37*    HbA1C: Hgb A1c MFr Bld  Date/Time Value Ref Range Status  06/16/2016 07:34 PM 4.9 4.8 - 5.6 % Final    Comment:    (NOTE)         Pre-diabetes: 5.7 - 6.4         Diabetes: >6.4         Glycemic control for adults with diabetes: <7.0     CBG: Recent Labs  Lab 06/15/17 0139 06/15/17 0646 06/19/17 2117  GLUCAP 78 76 83    Recent Results (from the past 240 hour(s))  Blood culture (routine x 2)     Status: None   Collection Time: 06/14/17  3:50 PM  Result Value Ref Range Status   Specimen Description BLOOD LEFT ANTECUBITAL  Final   Special Requests   Final    BOTTLES DRAWN AEROBIC AND ANAEROBIC Blood Culture adequate volume   Culture   Final    NO GROWTH 5 DAYS Performed at [facility], [place] [address], [place], [place] [address]    Report Status 06/19/2017 FINAL  Final  Urine culture     Status: None   Collection Time: 06/14/17  8:39 PM  Result Value Ref Range Status   Specimen Description URINE, CATHETERIZED  Final   Special Requests NONE  Final   Culture   Final    NO GROWTH Performed at [facility], [place] [address], [place], [place] [address]    Report Status 06/16/2017 FINAL  Final  MRSA PCR Screening     Status: None   Collection Time: 06/15/17  7:09 PM  Result Value Ref Range Status   MRSA by PCR NEGATIVE NEGATIVE Final    Comment:        The GeneXpert MRSA Assay (FDA approved for NASAL specimens only), is one component of a comprehensive MRSA colonization surveillance program. It is not intended to diagnose MRSA infection nor to guide or monitor treatment  for MRSA infections. Performed at [facility], [place] [address], [place], [place] [address]   Culture, Urine     Status: Abnormal   Collection Time: 06/19/17  3:09 PM  Result Value Ref Range Status   Specimen Description URINE, RANDOM  Final   Special Requests   Final    NONE Performed  at [facility], [place] [address], [place], [place] [address]    Culture MULTIPLE SPECIES PRESENT, SUGGEST RECOLLECTION (A)  Final   Report Status 06/20/2017 FINAL  Final     Scheduled Meds: . ARIPiprazole  2 mg Oral Daily  . aspirin  81 mg Oral Daily  . azaTHIOprine  100 mg Oral Daily  . buPROPion  150 mg Oral Daily  . carvedilol  12.5 mg Oral BID WC  . cycloSPORINE  1 drop Both Eyes BID  . dorzolamide-timolol  1 drop Both Eyes BID  . enoxaparin (LOVENOX) injection  40 mg Subcutaneous Q24H  . ferrous sulfate  325 mg Oral Q M,W,F  . isosorbide dinitrate  20 mg Oral TID  . mouth rinse  15 mL Mouth Rinse BID  . pantoprazole  40 mg Oral Daily  . predniSONE  20 mg Oral Daily  . pyridostigmine  30 mg Oral TID      LOS: 3 days   Cherene Altes, MD [facility] Office  [phone] Pager - Text Page per Amion as per below:  On-Call/Text Page:      [email or web address]      password [email or web address]  If 7PM-7AM, please contact night-coverage [email or web address] Password [email or web address] 06/20/2017, 11:52 AM

## 2017-06-20 NOTE — Progress Notes (Signed)
Pt alert awake no distress noted. RR 21, SpO2 at 98% on 1 lpm [place]. NIF = -38 VC = .78 L

## 2017-06-20 NOTE — Progress Notes (Signed)
PT Cancellation Note  Patient Details Name: Jennifer [name] MRN: [id number] DOB: 12-Jun-1935   Cancelled Treatment:    Reason Eval/Treat Not Completed: PT screened, no needs identified, will sign off(pt longterm SNF resident, total assist and bedbound at baseline. No acute needs will defer to SNF)   Jennifer [name] 06/20/2017, 7:42 AM  Jennifer [name], [place]

## 2017-06-21 LAB — BASIC METABOLIC PANEL
Anion gap: 7 (ref 5–15)
BUN: 32 mg/dL — AB (ref 6–20)
CALCIUM: 8.6 mg/dL — AB (ref 8.9–10.3)
CO2: 23 mmol/L (ref 22–32)
Chloride: 109 mmol/L (ref 101–111)
Creatinine, Ser: 1.52 mg/dL — ABNORMAL HIGH (ref 0.44–1.00)
GFR calc Af Amer: 36 mL/min — ABNORMAL LOW (ref >60)
GFR, EST NON AFRICAN AMERICAN: 31 mL/min — AB (ref >60)
GLUCOSE: 74 mg/dL (ref 65–99)
Potassium: 3.6 mmol/L (ref 3.5–5.1)
SODIUM: 139 mmol/L (ref 135–145)

## 2017-06-21 MED ORDER — FENTANYL 25 MCG/HR TD PT72
25.0000 ug | MEDICATED_PATCH | TRANSDERMAL | Status: DC
  Administered 2017-06-21 – 2017-06-24 (×2): 25 ug via TRANSDERMAL
  Filled 2017-06-21 (×3): qty 1

## 2017-06-21 MED ORDER — CLONIDINE HCL 0.1 MG PO TABS
0.1000 mg | ORAL_TABLET | Freq: Three times a day (TID) | ORAL | Status: DC
  Administered 2017-06-21 – 2017-06-24 (×10): 0.1 mg via ORAL
  Filled 2017-06-21 (×10): qty 1

## 2017-06-21 NOTE — Progress Notes (Signed)
[facility] PROGRESS NOTE  Jennifer [name] HAL:[id number] DOB: 1935/04/29 DOA: 06/14/2017 PCP: Janie Morning, DO  Brief summary   82 year old female w/ a history of myasthenia gravis, nonischemic cardiomyopathy with left ventricle ejection fraction 45 to 50%, CKD stage III, and chronic pain syndrome who presented lethargic and hypoxic. She was felt to be suffering w/ TME due to sedating medications.  Once her MS began to improve she was found to also be suffering a possible myasthenia crisis.    Assessment & Plan:  Acute toxic/metabolic encephalopathy. Likely related to sedating medications (lorazepam trazodone melatonin hydrocodone fentanyl transdermal and neurontin) - mental status appears to have returned to normal  Acute decompensation of myasthenia gravis. Completed IVIG per Neurology- continue pyridostimine, Imuran and prednisone - at home uses wheelchair for ambulation - PT/OT  Acute hypoxic resp failure Likely a combination of sedation as well as MG - resolved w/ sats now normal on RA  AKI on CKD stage III Renal fxn is worsening - hydrated w/ IVF last 24hrs w/o improvement - follow trend - baseline crt ~1.5 - hopefully crt is now reaching a plateau   Chronic mild systolic CHF. last EF measured was 45 to 50% - so signif overload on exam  Hypertension BP climbing. Restarted home meds, adjust as needed  Hypothyroidism Cont levothyroxine - TSH elevated but FT4 and FT3 normal - suggest recheck as outpt when not acutely ill   Mild hypokalemia Due to poor intake - corrected - Mg is normal   Chronic pain On fentanyl patch at home. Reports worsening of her chronic pains today. Requested to restart fentanyl patch.  Will restart at lower dose, monitor  Abdom pain - Diarrhea. UA suggestive of poor collection/not helpful - imodium prn - add bentyl and follow - diarrhea appears to have resolved. Also posisble opioid withdrawals. Will restart her home fentanyl. Monitor      Code Status: full Family Communication: d/w patient, RN (indicate person spoken with, relationship, and if by phone, the number) Disposition Plan: snf soon.    Consultants:  Neurology   Procedures:  CT head   Antibiotics: Anti-infectives (From admission, onward)   None        (indicate start date, and stop date if known)  HPI/Subjective: Reports one episode of diarrhea. Mild abdominal cramps. Afebrile. No s/s of gi bleeding   Objective: Vitals:   06/21/17 0400 06/21/17 0700  BP: (!) 183/85 (!) 172/107  Pulse: 72   Resp: (!) 21 19  Temp: 99 F (37.2 C) 98.2 F (36.8 C)  SpO2: 97%     Intake/Output Summary (Last 24 hours) at 06/21/2017 0836 Last data filed at 06/21/2017 0804 Gross per 24 hour  Intake 690 ml  Output 450 ml  Net 240 ml   Filed Weights   06/18/17 0308 06/19/17 0453 06/20/17 0506  Weight: 98.9 kg (218 lb 0.6 oz) 99.1 kg (218 lb 7.6 oz) 97.9 kg (215 lb 13.3 oz)    Exam:   General:  No distress   Cardiovascular: s1,s2 rrr  Respiratory: CTA BL  Abdomen: soft, mild tender right side   Musculoskeletal: mild edema    Data Reviewed: Basic Metabolic Panel: Recent Labs  Lab 06/17/17 1021 06/18/17 0243 06/19/17 0236 06/20/17 0223 06/21/17 0341  NA 139 140 138 139 139  K 4.0 3.8 3.4* 3.8 3.6  CL 108 107 105 109 109  CO2 24 24 24 22 23   GLUCOSE 96 67 74 75 74  BUN 23* 21* 20 29* 32*  CREATININE 1.37* 1.38* 1.41* 1.59* 1.52*  CALCIUM 9.2 9.1 8.8* 8.6* 8.6*  MG  --   --   --  1.8  --    Liver Function Tests: Recent Labs  Lab 06/14/17 1450 06/15/17 0320 06/20/17 0223  AST 31 23 24   ALT 23 20 15   ALKPHOS 73 66 45  BILITOT 0.9 0.7 1.0  PROT 6.4* 5.7* 8.0  ALBUMIN 3.3* 2.9* 2.3*   Recent Labs  Lab 06/14/17 1450  LIPASE 23   Recent Labs  Lab 06/14/17 1520 06/15/17 1219  AMMONIA 31 37*   CBC: Recent Labs  Lab 06/14/17 1450 06/15/17 0320 06/19/17 0236 06/20/17 0223  WBC 9.6 7.3 6.3 7.8  NEUTROABS  --   --  4.2   --   HGB 11.5* 11.4* 10.5* 10.9*  HCT 36.9 36.7 33.2* 34.2*  MCV 103.9* 106.1* 100.9* 100.9*  PLT 220 176 198 203   Cardiac Enzymes: No results for input(s): CKTOTAL, CKMB, CKMBINDEX, TROPONINI in the last 168 hours. BNP (last 3 results) Recent Labs    07/26/16 1457  BNP 53.5    ProBNP (last 3 results) No results for input(s): PROBNP in the last 8760 hours.  CBG: Recent Labs  Lab 06/15/17 0139 06/15/17 0646 06/19/17 2117  GLUCAP 78 76 83    Recent Results (from the past 240 hour(s))  Blood culture (routine x 2)     Status: None   Collection Time: 06/14/17  3:50 PM  Result Value Ref Range Status   Specimen Description BLOOD LEFT ANTECUBITAL  Final   Special Requests   Final    BOTTLES DRAWN AEROBIC AND ANAEROBIC Blood Culture adequate volume   Culture   Final    NO GROWTH 5 DAYS Performed at [facility], [address] [place], [place] [address]    Report Status 06/19/2017 FINAL  Final  Urine culture     Status: None   Collection Time: 06/14/17  8:39 PM  Result Value Ref Range Status   Specimen Description URINE, CATHETERIZED  Final   Special Requests NONE  Final   Culture   Final    NO GROWTH Performed at [facility], [place] [address] [place], [place] [address]    Report Status 06/16/2017 FINAL  Final  MRSA PCR Screening     Status: None   Collection Time: 06/15/17  7:09 PM  Result Value Ref Range Status   MRSA by PCR NEGATIVE NEGATIVE Final    Comment:        The GeneXpert MRSA Assay (FDA approved for NASAL specimens only), is one component of a comprehensive MRSA colonization surveillance program. It is not intended to diagnose MRSA infection nor to guide or monitor treatment for MRSA infections. Performed at [facility], [place] [address], [place], [place] [address]   Culture, Urine     Status: Abnormal   Collection Time: 06/19/17  3:09 PM  Result Value Ref Range Status   Specimen Description URINE, RANDOM  Final   Special  Requests   Final    NONE Performed at [facility], [place] [address] [place],  [address]    Culture MULTIPLE SPECIES PRESENT, SUGGEST RECOLLECTION (A)  Final   Report Status 06/20/2017 FINAL  Final     Studies: No results found.  Scheduled Meds: . ARIPiprazole  2 mg Oral Daily  . aspirin  81 mg Oral Daily  . azaTHIOprine  100 mg Oral Daily  . buPROPion  150  mg Oral Daily  . carvedilol  12.5 mg Oral BID WC  . cycloSPORINE  1 drop Both Eyes BID  . dicyclomine  10 mg Oral TID AC & HS  . dorzolamide-timolol  1 drop Both Eyes BID  . enoxaparin (LOVENOX) injection  40 mg Subcutaneous Q24H  . ferrous sulfate  325 mg Oral Q M,W,F  . isosorbide dinitrate  30 mg Oral TID  . mouth rinse  15 mL Mouth Rinse BID  . pantoprazole  40 mg Oral Daily  . predniSONE  20 mg Oral Daily  . pyridostigmine  30 mg Oral TID  . zolpidem  5 mg Oral QHS   Continuous Infusions: . sodium chloride 10 mL/hr at 06/21/17 0300    Principal Problem:   Acute encephalopathy Active Problems:   Hypothyroidism   Pernicious anemia   Essential hypertension   CKD (chronic kidney disease) stage 3, GFR 30-59 ml/min (HCC)   Nonischemic cardiomyopathy (HCC)   LBBB (left bundle branch block)   Myasthenia gravis ([place])   Acute metabolic encephalopathy   Hyperlipidemia   Pressure injury of skin    Time spent: >35 minutes     Kinnie Feil  [facility] Pager [phone]. If 7PM-7AM, please contact night-coverage at [email or web address], password [facility] 06/21/2017, 8:36 AM  LOS: 4 days

## 2017-06-21 NOTE — Evaluation (Signed)
Occupational Therapy Evaluation and defer further OT to SNF Patient Details Name: Jennifer [name] MRN: [id number] DOB: 05-25-1935 Today's Date: 06/21/2017    History of Present Illness 82 year old female w/ a history of myasthenia gravis, nonischemic cardiomyopathy with left ventricle ejection fraction 45 to 50%, CKD stage III, and chronic pain syndrome who presented lethargic and hypoxic. She was felt to be suffering w/ TME due to sedating medications. Once her MS began to improve she was found to also be suffering a possible myasthenia crisis   Clinical Impression   PTA Pt got max A for bathing/dressing from nursing home staff, was completing ADL in wc, and self-feeding. Pt is able to perform grooming/feeding sitting in bed. Her biggest complaint is that she's cold, and has no appetite. OT to defer further services after Pt's return to SNF. Thank you for the opportunity to serve this patient.     Follow Up Recommendations  SNF;Supervision/Assistance - 24 hour    Equipment Recommendations       Recommendations for Other Services       Precautions / Restrictions Precautions Precautions: Fall      Mobility Bed Mobility                  Transfers                 General transfer comment: NT - uses lift at baseline    Balance                                           ADL either performed or assessed with clinical judgement   ADL Overall ADL's : At baseline                                       General ADL Comments: Pt presents with generalized deconditioning and weakness, At baseline she would perform grooming tasks and eating. At this time she is able to perform these things but requires encouragement for participation     Vision Baseline Vision/History: Wears glasses Wears Glasses: At all times Patient Visual Report: No change from baseline       Perception     Praxis      Pertinent Vitals/Pain Pain Assessment:  No/denies pain     Hand Dominance Left   Extremity/Trunk Assessment Upper Extremity Assessment Upper Extremity Assessment: Generalized weakness           Communication Communication Communication: No difficulties   Cognition Arousal/Alertness: Awake/alert Behavior During Therapy: WFL for tasks assessed/performed Overall Cognitive Status: No family/caregiver present to determine baseline cognitive functioning                                     General Comments       Exercises     Shoulder Instructions      Home Living Family/patient expects to be discharged to:: Skilled nursing facility                                 Additional Comments: return to [facility]      Prior Functioning/Environment Level of Independence: Needs assistance  Gait / Transfers Assistance Needed:  lift for wheelchair ADL's / Homemaking Assistance Needed: dependent in bathing/dressing - still doing grooming tasks and self feeding            OT Problem List: Decreased activity tolerance;Decreased strength      OT Treatment/Interventions:      OT Goals(Current goals can be found in the care plan section) Acute Rehab OT Goals Patient Stated Goal: to get back to [facility] OT Goal Formulation: With patient Time For Goal Achievement: 07/05/17 Potential to Achieve Goals: Good  OT Frequency:     Barriers to D/C:            Co-evaluation              AM-PAC PT "6 Clicks" Daily Activity     Outcome Measure Help from another person eating meals?: A Little Help from another person taking care of personal grooming?: A Little Help from another person toileting, which includes using toliet, bedpan, or urinal?: Total Help from another person bathing (including washing, rinsing, drying)?: Total Help from another person to put on and taking off regular upper body clothing?: Total Help from another person to put on and taking off regular lower body  clothing?: Total 6 Click Score: 10   End of Session Nurse Communication: Mobility status  Activity Tolerance: Patient tolerated treatment well Patient left: in bed;with call bell/phone within reach  OT Visit Diagnosis: Muscle weakness (generalized) (M62.81)                Time: 0370-4888 OT Time Calculation (min): 20 min Charges:  OT General Charges $OT Visit: 1 Visit OT Evaluation $OT Eval Moderate Complexity: 1 Mod G-Codes:     Hulda Humphrey OTR/L [place] 06/21/2017, 4:53 PM

## 2017-06-21 NOTE — Progress Notes (Signed)
NIF -30 FVC 0.75L Pt with good effort

## 2017-06-21 NOTE — Progress Notes (Signed)
NIF -30  VC .8L with good pt effort.

## 2017-06-22 ENCOUNTER — Inpatient Hospital Stay (HOSPITAL_COMMUNITY): Payer: Medicare Other

## 2017-06-22 MED ORDER — AMLODIPINE BESYLATE 5 MG PO TABS
5.0000 mg | ORAL_TABLET | Freq: Every day | ORAL | Status: DC
  Administered 2017-06-22 – 2017-06-24 (×3): 5 mg via ORAL
  Filled 2017-06-22 (×3): qty 1

## 2017-06-22 MED ORDER — IOPAMIDOL (ISOVUE-300) INJECTION 61%
30.0000 mL | INTRAVENOUS | Status: AC
  Administered 2017-06-22 (×2): 30 mL via ORAL

## 2017-06-22 MED ORDER — IOPAMIDOL (ISOVUE-300) INJECTION 61%
INTRAVENOUS | Status: AC
  Filled 2017-06-22: qty 30

## 2017-06-22 NOTE — Progress Notes (Signed)
RT NOTE:  NIF: -26 FVC 0.75L

## 2017-06-22 NOTE — Plan of Care (Signed)
Continue current care plan 

## 2017-06-22 NOTE — Care Management Important Message (Signed)
Important Message  Patient Details  Name: Jennifer [name] MRN: [id number] Date of Birth: 08-09-1935   Medicare Important Message Given:  Yes    Nylene Inlow P Sie Formisano 06/22/2017, 1:34 PM

## 2017-06-22 NOTE — Progress Notes (Signed)
Pt attempted pulmonary mechanics this pm.  Pt nauseated and unable to perform test.  Will reattempt in am.

## 2017-06-22 NOTE — Progress Notes (Signed)
[facility] PROGRESS NOTE  Jennifer [name] GYF:[id number] DOB: 11-12-1935 DOA: 06/14/2017 PCP: Janie Morning, DO    Brief summary   82 year old female w/ a history of myasthenia gravis, nonischemic cardiomyopathy with left ventricle ejection fraction 45 to 50%, CKD stage III, and chronic pain syndrome who presented lethargic and hypoxic. She was felt to be suffering w/ TME due to sedating medications.  Once her MS began to improve she was found to also be suffering a possible myasthenia crisis.    Assessment & Plan:  Acute toxic/metabolic encephalopathy. Resolved. Likely related to sedating medications (lorazepam trazodone melatonin hydrocodone fentanyl transdermal and neurontin) - mental status appears to have returned to normal  Acute decompensation of myasthenia gravis. Completed IVIG per Neurology- continue pyridostimine, Imuran and prednisone - at home uses wheelchair for ambulation - PT/OT  Acute hypoxic resp failure Likely a combination of sedation as well as MG - resolved w/ sats now normal on RA  AKI on CKD stage III Renal fxn is worsening - hydrated w/ IVF for 24hrs w/o improvement - follow trend - baseline crt ~1.5 - hopefully crt is now reaching a plateau   Chronic mild systolic CHF. last EF measured was 45 to 50% - so signif overload on exam  Hypertension. Not at goal. Restarted home meds, clonidine as well. added amlodipine.  adjust as needed  Hypothyroidism Cont levothyroxine - TSH elevated but FT4 and FT3 normal - suggest recheck as outpt when not acutely ill   Mild hypokalemia Due to poor intake - corrected - Mg is normal   Chronic pain On fentanyl patch at home. Reports worsening of her chronic pains. Requested to restart fentanyl patch.  restart fentanyl at lower dose, monitor  Abdom pain - episode of diarrhea. UA suggestive of poor collection/not helpful - imodium prn - add bentyl and follow - diarrhea appears to have resolved. Also posisble opioid  withdrawals. restarted her home fentanyl.  Still with some RLQ tenderness on exam. Will obtain ct abd questionable diverticulitis. Monitor     Code Status: full Family Communication: d/w patient, RN (indicate person spoken with, relationship, and if by phone, the number) Disposition Plan: snf soon. 24-48 hrs    Consultants:  Neurology   Procedures:  CT head   Antibiotics: Anti-infectives (From admission, onward)   None       (indicate start date, and stop date if known)  HPI/Subjective: Reports right lower abdominal pains. one episode of diarrhea.  Afebrile. No s/s of gi bleeding   Objective: Vitals:   06/22/17 0100 06/22/17 0733  BP: (!) 141/53 (!) 175/73  Pulse:  61  Resp:  16  Temp:  98.3 F (36.8 C)  SpO2:  93%    Intake/Output Summary (Last 24 hours) at 06/22/2017 0801 Last data filed at 06/22/2017 0645 Gross per 24 hour  Intake 720 ml  Output 800 ml  Net -80 ml   Filed Weights   06/18/17 0308 06/19/17 0453 06/20/17 0506  Weight: 98.9 kg (218 lb 0.6 oz) 99.1 kg (218 lb 7.6 oz) 97.9 kg (215 lb 13.3 oz)    Exam:   General:  No distress   Cardiovascular: s1,s2 rrr  Respiratory: CTA BL  Abdomen: soft, mild tender right side   Musculoskeletal: mild edema    Data Reviewed: Basic Metabolic Panel: Recent Labs  Lab 06/17/17 1021 06/18/17 0243 06/19/17 0236 06/20/17 0223 06/21/17 0341  NA 139 140 138 139 139  K 4.0 3.8 3.4* 3.8 3.6  CL 108 107 105  109 109  CO2 24 24 24 22 23   GLUCOSE 96 67 74 75 74  BUN 23* 21* 20 29* 32*  CREATININE 1.37* 1.38* 1.41* 1.59* 1.52*  CALCIUM 9.2 9.1 8.8* 8.6* 8.6*  MG  --   --   --  1.8  --    Liver Function Tests: Recent Labs  Lab 06/20/17 0223  AST 24  ALT 15  ALKPHOS 45  BILITOT 1.0  PROT 8.0  ALBUMIN 2.3*   No results for input(s): LIPASE, AMYLASE in the last 168 hours. Recent Labs  Lab 06/15/17 1219  AMMONIA 37*   CBC: Recent Labs  Lab 06/19/17 0236 06/20/17 0223  WBC 6.3 7.8   NEUTROABS 4.2  --   HGB 10.5* 10.9*  HCT 33.2* 34.2*  MCV 100.9* 100.9*  PLT 198 203   Cardiac Enzymes: No results for input(s): CKTOTAL, CKMB, CKMBINDEX, TROPONINI in the last 168 hours. BNP (last 3 results) Recent Labs    07/26/16 1457  BNP 53.5    ProBNP (last 3 results) No results for input(s): PROBNP in the last 8760 hours.  CBG: Recent Labs  Lab 06/19/17 2117  GLUCAP 83    Recent Results (from the past 240 hour(s))  Blood culture (routine x 2)     Status: None   Collection Time: 06/14/17  3:50 PM  Result Value Ref Range Status   Specimen Description BLOOD LEFT ANTECUBITAL  Final   Special Requests   Final    BOTTLES DRAWN AEROBIC AND ANAEROBIC Blood Culture adequate volume   Culture   Final    NO GROWTH 5 DAYS Performed at [facility], [address] [place], [place] [address]    Report Status 06/19/2017 FINAL  Final  Urine culture     Status: None   Collection Time: 06/14/17  8:39 PM  Result Value Ref Range Status   Specimen Description URINE, CATHETERIZED  Final   Special Requests NONE  Final   Culture   Final    NO GROWTH Performed at [facility], [place] [address], [place], [place] [address]    Report Status 06/16/2017 FINAL  Final  MRSA PCR Screening     Status: None   Collection Time: 06/15/17  7:09 PM  Result Value Ref Range Status   MRSA by PCR NEGATIVE NEGATIVE Final    Comment:        The GeneXpert MRSA Assay (FDA approved for NASAL specimens only), is one component of a comprehensive MRSA colonization surveillance program. It is not intended to diagnose MRSA infection nor to guide or monitor treatment for MRSA infections. Performed at [facility], [place] [address], [place], [place] [address]   Culture, Urine     Status: Abnormal   Collection Time: 06/19/17  3:09 PM  Result Value Ref Range Status   Specimen Description URINE, RANDOM  Final   Special Requests   Final    NONE Performed at [facility],  [place] [address] [place], [place] [address]    Culture MULTIPLE SPECIES PRESENT, SUGGEST RECOLLECTION (A)  Final   Report Status 06/20/2017 FINAL  Final     Studies: No results found.  Scheduled Meds: . ARIPiprazole  2 mg Oral Daily  . aspirin  81 mg Oral Daily  . azaTHIOprine  100 mg Oral Daily  . buPROPion  150 mg Oral Daily  . carvedilol  12.5 mg Oral BID WC  . cloNIDine  0.1 mg Oral TID  .  cycloSPORINE  1 drop Both Eyes BID  . dicyclomine  10 mg Oral TID AC & HS  . dorzolamide-timolol  1 drop Both Eyes BID  . enoxaparin (LOVENOX) injection  40 mg Subcutaneous Q24H  . fentaNYL  25 mcg Transdermal Q72H  . ferrous sulfate  325 mg Oral Q M,W,F  . isosorbide dinitrate  30 mg Oral TID  . mouth rinse  15 mL Mouth Rinse BID  . pantoprazole  40 mg Oral Daily  . predniSONE  20 mg Oral Daily  . pyridostigmine  30 mg Oral TID  . zolpidem  5 mg Oral QHS   Continuous Infusions: . sodium chloride 10 mL/hr at 06/21/17 0300    Principal Problem:   Acute encephalopathy Active Problems:   Hypothyroidism   Pernicious anemia   Essential hypertension   CKD (chronic kidney disease) stage 3, GFR 30-59 ml/min (HCC)   Nonischemic cardiomyopathy (HCC)   LBBB (left bundle branch block)   Myasthenia gravis ([place])   Acute metabolic encephalopathy   Hyperlipidemia   Pressure injury of skin    Time spent: >35 minutes     Kinnie Feil  [facility] Pager [phone]. If 7PM-7AM, please contact night-coverage at [email or web address], password [facility] 06/22/2017, 8:01 AM  LOS: 5 days

## 2017-06-23 MED ORDER — GI COCKTAIL ~~LOC~~
30.0000 mL | Freq: Three times a day (TID) | ORAL | Status: DC | PRN
  Administered 2017-06-23: 30 mL via ORAL
  Filled 2017-06-23: qty 30

## 2017-06-23 NOTE — Clinical Social Work Note (Signed)
CSW continues to follow for discharge needs.  Shamona Wirtz, CSW [phone]  

## 2017-06-23 NOTE — Plan of Care (Signed)
Continue with current plan of care.

## 2017-06-23 NOTE — Progress Notes (Signed)
Pt refusing to eat. Pt states "it makes my stomach hurt worse". Will continue to monitor.

## 2017-06-23 NOTE — Progress Notes (Signed)
[facility] TEAM 1 - Stepdown/ICU TEAM  ANESHA HACKERT  ION:[id number] DOB: 1935-12-22 DOA: 06/14/2017 PCP: Janie Morning, DO    Brief Narrative:  82 year old female w/ a history of myasthenia gravis, nonischemic cardiomyopathy with left ventricle ejection fraction 45 to 50%, CKD stage III, and chronic pain syndrome who presented lethargic and hypoxic. She was felt to be suffering w/ TME due to sedating medications.  Once her MS began to improve she was found to also be suffering a possible myasthenia crisis.    Subjective: Pt reports that her abdom discomfort is greatly improved, but admits to poor intake.  She denies sob, n/v, or chest pain.  She has not yet been up and out of bed to a signif extent.    Assessment & Plan:  Acute toxic/metabolic encephalopathy - resolved  Likely related to sedating medications (lorazepam trazodone melatonin hydrocodone fentanyl transdermal and neurontin) - mental status appears to have returned to normal - avoiding polypharmacy   Acute decompensation of myasthenia gravis IVIG per Neurology w/ last dose infused 06/21/17 - continue pyridostimine, Imuran and prednisone - at home uses wheelchair for ambulation - PT/OT confirm return to SNF is advisable   Acute hypoxic resp failure Likely a combination of sedation as well as MG - resolved w/ sats now normal on RA  AKI on CKD stage III baseline crt ~1.5 - stable at her baseline - gently hydrate until intake improved   Recent Labs  Lab 06/17/17 1021 06/18/17 0243 06/19/17 0236 06/20/17 0223 06/21/17 0341  CREATININE 1.37* 1.38* 1.41* 1.59* 1.52*    Chronic mild systolic CHF w/o acute exacerbation  last EF measured was 45 to 50% - so signif overload on exam  Filed Weights   06/19/17 0453 06/20/17 0506 06/23/17 0623  Weight: 99.1 kg (218 lb 7.6 oz) 97.9 kg (215 lb 13.3 oz) 95.5 kg (210 lb 8.6 oz)    Hypertension BP now well controlled after adjustment of medical tx   Hypothyroidism Cont  levothyroxine - TSH elevated but FT4 and FT3 normal - suggest recheck as outpt when not acutely ill   Chronic pain On fentanyl patch - reports that pain is well controlled today   Abdom pain - Diarrhea UA suggestive of poor collection/not helpful - imodium prn - diarrhea appears to have resolved - CT abdom w/o acute finding - sx appear much improved today   DVT prophylaxis: lovenox  Code Status: FULL CODE Family Communication: no family present at time of exam  Disposition Plan: transfer to med/surg - PT/OT - will be ready for SNF when intake improved   Consultants:  Neurology   Antimicrobials:  none  Objective: Blood pressure 126/65, pulse 62, temperature 98.6 F (37 C), temperature source Oral, resp. rate 18, height 5\' 4"  (1.626 m), weight 95.5 kg (210 lb 8.6 oz), SpO2 97 %.  Intake/Output Summary (Last 24 hours) at 06/23/2017 0840 Last data filed at 06/23/2017 0300 Gross per 24 hour  Intake 1360 ml  Output 200 ml  Net 1160 ml   Filed Weights   06/19/17 0453 06/20/17 0506 06/23/17 0623  Weight: 99.1 kg (218 lb 7.6 oz) 97.9 kg (215 lb 13.3 oz) 95.5 kg (210 lb 8.6 oz)    Examination: General: No acute respiratory distress - A&O Lungs: CTA B - no wheezing  Cardiovascular: RRR w/o M or rub  Abdomen: NT/ND, soft, bs+, no mass  Extremities: trace B LE edema w/o cyanosis   CBC: Recent Labs  Lab 06/19/17 0236 06/20/17 0223  WBC 6.3 7.8  NEUTROABS 4.2  --   HGB 10.5* 10.9*  HCT 33.2* 34.2*  MCV 100.9* 100.9*  PLT 198 809   Basic Metabolic Panel: Recent Labs  Lab 06/19/17 0236 06/20/17 0223 06/21/17 0341  NA 138 139 139  K 3.4* 3.8 3.6  CL 105 109 109  CO2 24 22 23   GLUCOSE 74 75 74  BUN 20 29* 32*  CREATININE 1.41* 1.59* 1.52*  CALCIUM 8.8* 8.6* 8.6*  MG  --  1.8  --    GFR: Estimated Creatinine Clearance: 33.6 mL/min (A) (by C-G formula based on SCr of 1.52 mg/dL (H)).  Liver Function Tests: Recent Labs  Lab 06/20/17 0223  AST 24  ALT 15    ALKPHOS 45  BILITOT 1.0  PROT 8.0  ALBUMIN 2.3*    HbA1C: Hgb A1c MFr Bld  Date/Time Value Ref Range Status  06/16/2016 07:34 PM 4.9 4.8 - 5.6 % Final    Comment:    (NOTE)         Pre-diabetes: 5.7 - 6.4         Diabetes: >6.4         Glycemic control for adults with diabetes: <7.0     CBG: Recent Labs  Lab 06/19/17 2117  GLUCAP 83    Recent Results (from the past 240 hour(s))  Blood culture (routine x 2)     Status: None   Collection Time: 06/14/17  3:50 PM  Result Value Ref Range Status   Specimen Description BLOOD LEFT ANTECUBITAL  Final   Special Requests   Final    BOTTLES DRAWN AEROBIC AND ANAEROBIC Blood Culture adequate volume   Culture   Final    NO GROWTH 5 DAYS Performed at [facility], [address] [place], [place] [address]    Report Status 06/19/2017 FINAL  Final  Urine culture     Status: None   Collection Time: 06/14/17  8:39 PM  Result Value Ref Range Status   Specimen Description URINE, CATHETERIZED  Final   Special Requests NONE  Final   Culture   Final    NO GROWTH Performed at [facility], [place] [address] [place], [place] [address]    Report Status 06/16/2017 FINAL  Final  MRSA PCR Screening     Status: None   Collection Time: 06/15/17  7:09 PM  Result Value Ref Range Status   MRSA by PCR NEGATIVE NEGATIVE Final    Comment:        The GeneXpert MRSA Assay (FDA approved for NASAL specimens only), is one component of a comprehensive MRSA colonization surveillance program. It is not intended to diagnose MRSA infection nor to guide or monitor treatment for MRSA infections. Performed at [facility], [place] [address] [place], [place] [address]   Culture, Urine     Status: Abnormal   Collection Time: 06/19/17  3:09 PM  Result Value Ref Range Status   Specimen Description URINE, RANDOM  Final   Special Requests   Final    NONE Performed at [facility], [place] [address] [place], [place] [address]     Culture MULTIPLE SPECIES PRESENT, SUGGEST RECOLLECTION (A)  Final   Report Status 06/20/2017 FINAL  Final     Scheduled Meds: . amLODipine  5 mg Oral Daily  . ARIPiprazole  2 mg Oral Daily  . aspirin  81 mg Oral Daily  . azaTHIOprine  100 mg Oral Daily  .  buPROPion  150 mg Oral Daily  . carvedilol  12.5 mg Oral BID WC  . cloNIDine  0.1 mg Oral TID  . cycloSPORINE  1 drop Both Eyes BID  . dicyclomine  10 mg Oral TID AC & HS  . dorzolamide-timolol  1 drop Both Eyes BID  . enoxaparin (LOVENOX) injection  40 mg Subcutaneous Q24H  . fentaNYL  25 mcg Transdermal Q72H  . ferrous sulfate  325 mg Oral Q M,W,F  . isosorbide dinitrate  30 mg Oral TID  . mouth rinse  15 mL Mouth Rinse BID  . pantoprazole  40 mg Oral Daily  . predniSONE  20 mg Oral Daily  . pyridostigmine  30 mg Oral TID  . zolpidem  5 mg Oral QHS     LOS: 6 days   Cherene Altes, MD [facility] Office  [phone] Pager - Text Page per Amion as per below:  On-Call/Text Page:      [email or web address]      password [email or web address]  If 7PM-7AM, please contact night-coverage [email or web address] Password [email or web address] 06/23/2017, 8:40 AM

## 2017-06-23 NOTE — Progress Notes (Signed)
Attempted NIF and VC with patient. Patient states she's not feeling well and unable to perform. Will try again in pm.

## 2017-06-24 LAB — BASIC METABOLIC PANEL
Anion gap: 9 (ref 5–15)
BUN: 24 mg/dL — AB (ref 6–20)
CHLORIDE: 108 mmol/L (ref 101–111)
CO2: 20 mmol/L — ABNORMAL LOW (ref 22–32)
CREATININE: 1.27 mg/dL — AB (ref 0.44–1.00)
Calcium: 8.9 mg/dL (ref 8.9–10.3)
GFR calc non Af Amer: 39 mL/min — ABNORMAL LOW (ref >60)
GFR, EST AFRICAN AMERICAN: 45 mL/min — AB (ref >60)
Glucose, Bld: 70 mg/dL (ref 65–99)
Potassium: 4 mmol/L (ref 3.5–5.1)
SODIUM: 137 mmol/L (ref 135–145)

## 2017-06-24 MED ORDER — CLONIDINE HCL 0.1 MG PO TABS: 0.1000 mg | ORAL_TABLET | Freq: Three times a day (TID) | ORAL | 0 refills | Status: AC

## 2017-06-24 MED ORDER — AMLODIPINE BESYLATE 5 MG PO TABS: 5.0000 mg | ORAL_TABLET | Freq: Every day | ORAL | 0 refills | Status: AC

## 2017-06-24 MED ORDER — FENTANYL 25 MCG/HR TD PT72: 25.0000 ug | MEDICATED_PATCH | TRANSDERMAL | 0 refills | Status: AC

## 2017-06-24 MED ORDER — FENTANYL 25 MCG/HR TD PT72: 25.0000 ug | MEDICATED_PATCH | TRANSDERMAL | 0 refills | Status: DC

## 2017-06-24 NOTE — Clinical Social Work Placement (Signed)
   CLINICAL SOCIAL WORK PLACEMENT  NOTE  Date:  06/24/2017  Patient Details  Name: Jennifer [name] MRN: [id number] Date of Birth: 09-04-1935  Clinical Social Work is seeking post-discharge placement for this patient at the [place] level of care (*CSW will initial, date and re-position this form in  chart as items are completed):      Patient/family provided with [facility] Work Department's list of facilities offering this level of care within the geographic area requested by the patient (or if unable, by the patient's family).  Yes   Patient/family informed of their freedom to choose among providers that offer the needed level of care, that participate in Medicare, Medicaid or managed care program needed by the patient, have an available bed and are willing to accept the patient.      Patient/family informed of [facility]'s ownership interest in [facility] and [facility], as well as of the fact that they are under no obligation to receive care at these facilities.  PASRR submitted to EDS on       PASRR number received on       Existing PASRR number confirmed on       FL2 transmitted to all facilities in geographic area requested by pt/family on       FL2 transmitted to all facilities within larger geographic area on       Patient informed that his/her managed care company has contracts with or will negotiate with certain facilities, including the following:        Yes   Patient/family informed of bed offers received.  Patient chooses bed at [facility]     Physician recommends and patient chooses bed at      Patient to be transferred to [facility] on 06/24/17.  Patient to be transferred to facility by [facility]     Patient family notified on 06/24/17 of transfer.  Name of family member notified:  Sherri     PHYSICIAN       Additional Comment:    _______________________________________________ Eileen Stanford,  LCSW 06/24/2017, 2:04 PM

## 2017-06-24 NOTE — Clinical Social Work Note (Addendum)
CSW has attempted to call pt's daughter, Venida Jarvis and pt's son, Edderick to let them know to will be transferred to [facility]--unsuccesfull, left voicemail--awaiting call back.   3:00- Two failed attempts to reach pt's daughter (POA). Pt's granddaughter at bedside. Pt is alert and oriented. Pt and pt's granddaughter aware of/agreeable to transfer. Pt's granddaughter will follow behind [facility]. Pt's granddaughter states "I will relay the information" in reguards to pt's daughter.   [place], [place]

## 2017-06-24 NOTE — Clinical Social Work Note (Addendum)
Clinical Social Worker facilitated patient discharge including contacting patient family and facility to confirm patient discharge plans.  Clinical information faxed to facility and family agreeable with plan.  CSW arranged ambulance transport via [facility] to [facility].  RN to call [phone] for report prior to discharge.  Clinical Social Worker will sign off for now as social work intervention is no longer needed. Please consult us again if new need arises.  Samanthajo Payano, LCSWA [phone]  

## 2017-06-24 NOTE — Discharge Instructions (Signed)
Myasthenia Gravis  Myasthenia gravis (MG) means severe weakness. It is a long-term (chronic) condition that causes weakness in the muscles you can control (voluntary muscles). MG can affect any voluntary muscle. The muscles most often affected are the ones that control:   Eye movement.   Facial movements.   Swallowing.    MG is an autoimmune disease, which means that your body's defense system (immune system) attacks healthy parts of your body instead of germs and other things that make you sick. When you have MG, your immune system makes proteins (antibodies) that block the chemical (acetylcholine) your body needs to send nerve signals to your muscles. This causes muscle weakness.  What are the causes?  The exact cause of MG is unknown. One possible cause is an enlarged thymus gland, which is located under your breastbone.  What are the signs or symptoms?  The earliest symptom of MG is muscle weakness that gets worse with activity and gets better after rest. Other symptoms of MG may include:   Drooping eyelids.   Double vision.   Loss of facial expression.   Trouble chewing and swallowing.   Slurred speech.   A waddling walk.   Weakness of the arms, hands, and legs.    Trouble breathing is the most dangerous symptom of MG. Sudden and severe difficulty breathing (myasthenic crisis) may require emergency breathing support. This symptom sometimes happens after:   Infection.   Fever.   Drug reaction.    How is this diagnosed?  It can be hard to diagnose MG because muscle weakness is a common symptom in many conditions. Your health care provider will do a physical exam. You may also have tests that will help make a diagnosis. These may include:   A blood test.   A test using the medicine edrophonium. This medicine increases muscle strength by slowing the breakdown of acetylcholine.   Tests to measure nerve conduction to muscle (electromyography).   An imaging study of the chest (CT or MRI).    How is  this treated?  Treatment can improve muscle strength. Sometimes symptoms of MG go away for a while (remission) and you can stop treatment. Possible treatments include:   Medicine.   Removal of the thymus gland (thymectomy). This may result in a long remission for some people.    Follow these instructions at home:   Take medicines only as directed by your health care provider.   Get plenty of rest to conserve your energy.   Take frequent breaks to rest your eyes.   Maintain a healthy diet and a healthy weight.   Do not use any tobacco products including cigarettes, chewing tobacco, or electronic cigarettes. If you need help quitting, ask your health care provider.   Keep all follow-up visits as directed by your health care provider. This is important.  Contact a health care provider if:   Your symptoms get worse after a fever or infection.   You have a reaction to a medicine you are taking.   Your symptoms change or get worse.  Get help right away if:  You have trouble breathing.  This information is not intended to replace advice given to you by your health care provider. Make sure you discuss any questions you have with your health care provider.  Document Released: 05/09/2000 Document Revised: 07/09/2015 Document Reviewed: 04/03/2013  Elsevier Interactive Patient Education  2018 [employer]

## 2017-06-24 NOTE — Discharge Summary (Signed)
Physician Discharge Summary  Jennifer [name] RWE:[id number] DOB: Aug 28, 1935 DOA: 06/14/2017  PCP: Janie Morning, DO  Admit date: 06/14/2017 Discharge date: 06/24/2017  Admitted From: Home Disposition:  Home  Discharge Condition:Stable CODE STATUS:FULL Diet recommendation: Dysphagia 3 diet,thin liquid  Brief/Interim Summary:  82 year old female w/ a history of myasthenia gravis, nonischemic cardiomyopathy with left ventricle ejection fraction 45 to 50%, CKD stage III, and chronic pain syndrome who presented lethargic and hypoxic. She was felt to be suffering toxic metabolic encepahlopathy due to sedating medications.  Patient has been evaluated by neurology.  Her mental status gradually improved and she is on baseline right now.Her myasthenia gravis medications have been resumed.  We limited her sedating medications. Patient is stable for discharge back to skilled nursing facility today.  She should follow-up with neurology as an outpatient.    Following problems were addressed during her hospitalization:   Acute toxic/metabolic encephalopathy  Resolved Likely related to sedating medications (lorazepam trazodone melatonin hydrocodone fentanyl transdermal and neurontin) - mental status appears to have returned to normal - avoiding polypharmacy.  We recommend to avoid narcotics and other sedating medications as well as possible.  Acute decompensation of myasthenia gravis IVIG was given per Neurology w/ last dose infused 06/21/17 - continue pyridostimine, Imuran and prednisone - at home uses wheelchair for ambulation - PT/OT consulted and she will  return to SNF.   Acute hypoxic resp failure Likely a combination of sedation as well as MG - resolved w/ sats now normal on RA  AKI on CKD stage III Baseline crt ~1.5 - stable at her baseline .  Chronic mild systolic CHF w/o acute exacerbation  Last EF measured was 45 to 50% - so signif overload on exam  Hypertension BP now well  controlled after adjustment of medical tx   Hypothyroidism Cont levothyroxine - TSH elevated but FT4 and FT3 normal - suggest recheck as outpt when not acutely ill   Chronic pain On fentanyl patch - reports that pain is well controlled today .Dose decreased to 25 mcg Q72 hrs.  Abdominal pain - Diarrhea Diarrhea appears to have resolved - CT abdom w/o acute finding - sx appear much improved today  She continues to have poor appetite.  Encourage oral intake.  Seen by speech today and recommended dysphagia 3 diet.      Discharge Diagnoses:  Principal Problem:   Acute encephalopathy Active Problems:   Hypothyroidism   Pernicious anemia   Essential hypertension   CKD (chronic kidney disease) stage 3, GFR 30-59 ml/min (HCC)   Nonischemic cardiomyopathy (HCC)   LBBB (left bundle branch block)   Myasthenia gravis ([place])   Acute metabolic encephalopathy   Hyperlipidemia   Pressure injury of skin    Discharge Instructions  Discharge Instructions    Diet - low sodium heart healthy   Complete by:  As directed    Dyspahia 3 diet,thin liquid   Discharge instructions   Complete by:  As directed    1) Please follow-up with your PCP in a week.Do a CBC and BMP tests in a week. 2) Follow up with neurology as an outpatient.  Name and number of the provider group has been attached. 3) Take prescribed medication as instructed. 4)Avoid narcotics and other sedating medications.   Increase activity slowly   Complete by:  As directed      Allergies as of 06/24/2017      Reactions   Codeine Nausea Only      Medication List  STOP taking these medications   fentaNYL 50 MCG/HR Commonly known as:  DURAGESIC - dosed mcg/hr Replaced by:  fentaNYL 25 MCG/HR patch   gabapentin 300 MG capsule Commonly known as:  NEURONTIN   HYDROcodone-acetaminophen 5-325 MG tablet Commonly known as:  NORCO/VICODIN     TAKE these medications   acetaminophen 500 MG tablet Commonly known as:   TYLENOL Take 500 mg by mouth every 4 (four) hours as needed for mild pain or headache (or fever of 99.5-101 F).   amLODipine 5 MG tablet Commonly known as:  NORVASC Take 1 tablet (5 mg total) by mouth daily. Start taking on:  06/25/2017   ARIPiprazole 2 MG tablet Commonly known as:  ABILIFY Take 2 mg by mouth daily.   aspirin 81 MG chewable tablet Chew 81 mg by mouth daily.   azaTHIOprine 50 MG tablet Commonly known as:  IMURAN Take 2 tablets (100 mg total) by mouth daily.   buPROPion 150 MG 12 hr tablet Commonly known as:  WELLBUTRIN SR Take 150 mg by mouth daily.   carvedilol 12.5 MG tablet Commonly known as:  COREG Take 12.5 mg by mouth 2 (two) times daily with a meal. HOLD FOR SYSTOLIC B/P <951 or HR <88   CERTAVITE/ANTIOXIDANTS Tabs Take 1 tablet by mouth daily.   cloNIDine 0.1 MG tablet Commonly known as:  CATAPRES Take 1 tablet (0.1 mg total) by mouth 3 (three) times daily. What changed:  when to take this   COSOPT PF 22.3-6.8 MG/ML Soln ophthalmic solution Generic drug:  dorzolamidel-timolol Place 1 drop into both eyes 2 (two) times daily.   diclofenac sodium 1 % Gel Commonly known as:  VOLTAREN Apply 2 g topically 4 (four) times daily. Rub into affected area of foot 2 to 4 times daily What changed:    when to take this  additional instructions   fentaNYL 25 MCG/HR patch Commonly known as:  DURAGESIC - dosed mcg/hr Place 1 patch (25 mcg total) onto the skin every 3 (three) days. REMOVE OLD PATCH FIRST Replaces:  fentaNYL 50 MCG/HR   ferrous sulfate 325 (65 FE) MG tablet Take 325 mg by mouth every Monday, Wednesday, and Friday.   furosemide 20 MG tablet Commonly known as:  LASIX Take 1 tablet (20 mg total) by mouth daily. What changed:  additional instructions   isosorbide dinitrate 30 MG tablet Commonly known as:  ISORDIL Take 30 mg by mouth 4 (four) times daily. HOLD FOR SYSTOLIC B/P <416   LORazepam 0.5 MG tablet Commonly known as:   ATIVAN Take 0.5-1 mg by mouth See admin instructions. Take 0.5 mg by mouth in the morning and 1 mg at bedtime   losartan 25 MG tablet Commonly known as:  COZAAR Take 1 tablet by mouth 2 (two) times daily. HOLD FOR SYSTOLIC B/P <606   Melatonin 3 MG Tabs Take 3 mg by mouth at bedtime.   [place] 200-200-20 MG/5ML suspension Generic drug:  alum & mag hydroxide-simeth Take 30 mLs by mouth every 6 (six) hours as needed for indigestion or heartburn. AND CANNOT EXCEED 4 DOSES IN 24 HOURS   NONFORMULARY OR COMPOUNDED ITEM [facility]:  "Authorized Substitute" Pain Cream Formulation - Ibuprofen 15%, Gaclofen 1%, Gabapentin 3%, Lidocaine 2%, apply 2 grams to affected area 3 times daily.   pantoprazole 20 MG tablet Commonly known as:  PROTONIX Take 20 mg by mouth daily.   polyethylene glycol packet Commonly known as:  MIRALAX / GLYCOLAX Take 17 g by mouth daily as needed (  FOR CONSTIPATION). MIXED INTO 4-8 OUNCES OF LIQUID   predniSONE 20 MG tablet Commonly known as:  DELTASONE Take 1 tablet (20 mg total) by mouth daily.   pyridostigmine 60 MG tablet Commonly known as:  MESTINON Take 0.5 tablets (30 mg total) by mouth 3 (three) times daily.   REFRESH LIQUIGEL OP Place 1 drop into both eyes 2 (two) times daily as needed (for dryness).   RESTASIS 0.05 % ophthalmic emulsion Generic drug:  cycloSPORINE Place 1 drop into both eyes 2 (two) times daily.   traZODone 50 MG tablet Commonly known as:  DESYREL Take 25 mg by mouth at bedtime.   vitamin B-12 100 MCG tablet Commonly known as:  CYANOCOBALAMIN Take 50 mcg by mouth daily.   Vitamin D3 2000 units Tabs Take 2,000 Units by mouth daily.      Follow-up Information    Janie Morning, DO. Schedule an appointment as soon as possible for a visit in 1 week(s).   Specialty:  Family Medicine Contact information: [place] [address] [phone]        [facility]. Schedule an  appointment as soon as possible for a visit in 2 week(s).   Specialty:  Neurology Contact information: [place] [phone]         Allergies  Allergen Reactions  . Codeine Nausea Only    Consultations:  Neurology   Procedures/Studies: Ct Abdomen Pelvis Wo Contrast  Result Date: 06/22/2017 CLINICAL DATA:  Abdominal pain question diverticulitis, gastroenteritis or colitis, history myasthenia gravis, hypertension, dementia, non ischemic cardiomyopathy, stage III chronic kidney disease EXAM: CT ABDOMEN AND PELVIS WITHOUT CONTRAST TECHNIQUE: Multidetector CT imaging of the abdomen and pelvis was performed following the standard protocol without IV contrast. Sagittal and coronal MPR images reconstructed from axial data set. Patient drank dilute oral contrast for exam. COMPARISON:  05/21/2013 FINDINGS: Lower chest: Minimal atelectasis at LEFT lung base Hepatobiliary: Gallbladder surgically absent. Calcified subcapsular foci RIGHT lobe liver unchanged question sequela of prior infection or trauma. Liver otherwise unremarkable. Pancreas: Atrophic pancreas without mass Spleen: Normal appearance Adrenals/Urinary Tract: Adrenal glands normal appearance. Absent RIGHT kidney. Nonobstructing calculi LEFT kidney. No hydronephrosis or definite renal mass. Bladder and LEFT ureter unremarkable. Stomach/Bowel: Diffuse colonic diverticulosis without evidence of diverticulitis. Appendix not definitely visualized but no pericecal inflammatory process seen. Decompressed stomach. Small bowel loops unremarkable. Vascular/Lymphatic: Atherosclerotic calcifications aorta and iliac arteries without aneurysm. No adenopathy. Reproductive: Exophytic calcified leiomyoma posterior uterus 2.7 cm diameter. Uterus and adnexa otherwise unremarkable Other: No free air or free fluid. No definite hernia or inflammatory process. Musculoskeletal: Bones demineralized. Spinal stenosis at  L4-L5, multifactorial. Multilevel degenerative disc disease changes lumbar spine. Mild scattered facet degenerative changes lower lumbar spine. Thickening, trabeculation and mottled attenuation of the sacrum consistent with Paget's disease. Degenerative changes of the hip joints. IMPRESSION: Diffuse colonic diverticulosis without evidence of diverticulitis. Absent RIGHT kidney. Calcified uterine leiomyoma. No acute intra-abdominal or intrapelvic abnormalities. Pagetoid changes of the sacrum. Tiny nonobstructing LEFT renal calculi. Electronically Signed   By: Lavonia Dana M.D.   On: 06/22/2017 17:45   Dg Chest 2 View  Result Date: 06/14/2017 CLINICAL DATA:  Sudden decrease in level of consciousness, hypotension, hypoxic, history myasthenia gravis, non ischemic cardiomyopathy, dementia, hypertension EXAM: CHEST - 2 VIEW COMPARISON:  06/21/2016 FINDINGS: Severely rotated to the RIGHT. Enlargement of cardiac silhouette. Tortuous aorta. Pulmonary vascularity normal. Minimal atelectasis RIGHT mid lung. No gross infiltrate, pleural effusion  or pneumothorax. Bones demineralized. IMPRESSION: Subsegmental atelectasis RIGHT mid lung. No additional abnormalities identified on rotated exam. Electronically Signed   By: Lavonia Dana M.D.   On: 06/14/2017 17:26   Ct Head Wo Contrast  Result Date: 06/15/2017 CLINICAL DATA:  Altered mental status EXAM: CT HEAD WITHOUT CONTRAST TECHNIQUE: Contiguous axial images were obtained from the base of the skull through the vertex without intravenous contrast. COMPARISON:  06/21/2016 FINDINGS: Brain: There is atrophy and chronic small vessel disease changes. No acute intracranial abnormality. Specifically, no hemorrhage, hydrocephalus, mass lesion, acute infarction, or significant intracranial injury. Vascular: No hyperdense vessel or unexpected calcification. Skull: No acute calvarial abnormality. Sinuses/Orbits: Visualized paranasal sinuses and mastoids clear. Orbital soft tissues  unremarkable. Other: None IMPRESSION: No acute intracranial abnormality. Atrophy, chronic microvascular disease. Electronically Signed   By: Rolm Baptise M.D.   On: 06/15/2017 10:02       Subjective: Patient seen and examined the bedside this morning.  Looks comfortable.  Denies any abdominal pain.  Diarrhea has resolved.  Complains of low appetite.  Discharge Exam: Vitals:   06/24/17 0856 06/24/17 1122  BP:  140/66  Pulse:  65  Resp:  20  Temp:  98.5 F (36.9 C)  SpO2: 97% 93%   Vitals:   06/24/17 0400 06/24/17 0737 06/24/17 0856 06/24/17 1122  BP:  (!) 166/73  140/66  Pulse: (!) 58 67  65  Resp: 15 17  20   Temp:  98.6 F (37 C)  98.5 F (36.9 C)  TempSrc:  Oral  Oral  SpO2: 97% 96% 97% 93%  Weight:      Height:        General: Pt is alert, awake, not in acute distress,obese Cardiovascular: RRR, S1/S2 +, no rubs, no gallops Respiratory: CTA bilaterally, no wheezing, no rhonchi Abdominal: Soft, NT, ND, bowel sounds + Extremities: no edema, no cyanosis    The results of significant diagnostics from this hospitalization (including imaging, microbiology, ancillary and laboratory) are listed below for reference.     Microbiology: Recent Results (from the past 240 hour(s))  Blood culture (routine x 2)     Status: None   Collection Time: 06/14/17  3:50 PM  Result Value Ref Range Status   Specimen Description BLOOD LEFT ANTECUBITAL  Final   Special Requests   Final    BOTTLES DRAWN AEROBIC AND ANAEROBIC Blood Culture adequate volume   Culture   Final    NO GROWTH 5 DAYS Performed at [facility], [address], [place], [place] [address]    Report Status 06/19/2017 FINAL  Final  Urine culture     Status: None   Collection Time: 06/14/17  8:39 PM  Result Value Ref Range Status   Specimen Description URINE, CATHETERIZED  Final   Special Requests NONE  Final   Culture   Final    NO GROWTH Performed at [facility], [place] [address], [place],  [place] [address]    Report Status 06/16/2017 FINAL  Final  MRSA PCR Screening     Status: None   Collection Time: 06/15/17  7:09 PM  Result Value Ref Range Status   MRSA by PCR NEGATIVE NEGATIVE Final    Comment:        The GeneXpert MRSA Assay (FDA approved for NASAL specimens only), is one component of a comprehensive MRSA colonization surveillance program. It is not intended to diagnose MRSA infection nor to guide or monitor treatment for MRSA infections. Performed at [facility], [place] [address], [place], [place] [address]   Culture, Urine     Status: Abnormal   Collection Time: 06/19/17  3:09 PM  Result Value Ref Range Status   Specimen Description URINE, RANDOM  Final   Special Requests   Final    NONE Performed at [facility], [place] [address] [place], [place] [address]    Culture MULTIPLE SPECIES PRESENT, SUGGEST RECOLLECTION (A)  Final   Report Status 06/20/2017 FINAL  Final     Labs: BNP (last 3 results) Recent Labs    07/26/16 1457  BNP 10.2   Basic Metabolic Panel: Recent Labs  Lab 06/18/17 0243 06/19/17 0236 06/20/17 0223 06/21/17 0341 06/24/17 0255  NA 140 138 139 139 137  K 3.8 3.4* 3.8 3.6 4.0  CL 107 105 109 109 108  CO2 24 24 22 23  20*  GLUCOSE 67 74 75 74 70  BUN 21* 20 29* 32* 24*  CREATININE 1.38* 1.41* 1.59* 1.52* 1.27*  CALCIUM 9.1 8.8* 8.6* 8.6* 8.9  MG  --   --  1.8  --   --    Liver Function Tests: Recent Labs  Lab 06/20/17 0223  AST 24  ALT 15  ALKPHOS 45  BILITOT 1.0  PROT 8.0  ALBUMIN 2.3*   No results for input(s): LIPASE, AMYLASE in the last 168 hours. No results for input(s): AMMONIA in the last 168 hours. CBC: Recent Labs  Lab 06/19/17 0236 06/20/17 0223  WBC 6.3 7.8  NEUTROABS 4.2  --   HGB 10.5* 10.9*  HCT 33.2* 34.2*  MCV 100.9* 100.9*  PLT 198 203   Cardiac Enzymes: No results for input(s): CKTOTAL, CKMB, CKMBINDEX, TROPONINI in the last 168 hours. BNP: Invalid input(s): POCBNP CBG: Recent  Labs  Lab 06/19/17 2117  GLUCAP 83   D-Dimer No results for input(s): DDIMER in the last 72 hours. Hgb A1c No results for input(s): HGBA1C in the last 72 hours. Lipid Profile No results for input(s): CHOL, HDL, LDLCALC, TRIG, CHOLHDL, LDLDIRECT in the last 72 hours. Thyroid function studies No results for input(s): TSH, T4TOTAL, T3FREE, THYROIDAB in the last 72 hours.  Invalid input(s): FREET3 Anemia work up No results for input(s): VITAMINB12, FOLATE, FERRITIN, TIBC, IRON, RETICCTPCT in the last 72 hours. Urinalysis    Component Value Date/Time   COLORURINE AMBER (A) 06/19/2017 1540   APPEARANCEUR CLOUDY (A) 06/19/2017 1540   LABSPEC 1.018 06/19/2017 1540   PHURINE 6.0 06/19/2017 1540   GLUCOSEU NEGATIVE 06/19/2017 1540   HGBUR SMALL (A) 06/19/2017 1540   BILIRUBINUR NEGATIVE 06/19/2017 1540   KETONESUR 20 (A) 06/19/2017 1540   PROTEINUR 30 (A) 06/19/2017 1540   UROBILINOGEN 0.2 06/11/2013 1110   NITRITE POSITIVE (A) 06/19/2017 1540   LEUKOCYTESUR NEGATIVE 06/19/2017 1540   Sepsis Labs Invalid input(s): PROCALCITONIN,  WBC,  LACTICIDVEN Microbiology Recent Results (from the past 240 hour(s))  Blood culture (routine x 2)     Status: None   Collection Time: 06/14/17  3:50 PM  Result Value Ref Range Status   Specimen Description BLOOD LEFT ANTECUBITAL  Final   Special Requests   Final    BOTTLES DRAWN AEROBIC AND ANAEROBIC Blood Culture adequate volume   Culture   Final    NO GROWTH 5 DAYS Performed at [facility], [place] [address], [place],  [address]    Report Status 06/19/2017 FINAL  Final  Urine culture     Status: None   Collection Time:  06/14/17  8:39 PM  Result Value Ref Range Status   Specimen Description URINE, CATHETERIZED  Final   Special Requests NONE  Final   Culture   Final    NO GROWTH Performed at [facility], [address], [place], [place] [address]    Report Status 06/16/2017 FINAL  Final  MRSA PCR Screening     Status:  None   Collection Time: 06/15/17  7:09 PM  Result Value Ref Range Status   MRSA by PCR NEGATIVE NEGATIVE Final    Comment:        The GeneXpert MRSA Assay (FDA approved for NASAL specimens only), is one component of a comprehensive MRSA colonization surveillance program. It is not intended to diagnose MRSA infection nor to guide or monitor treatment for MRSA infections. Performed at [facility], [place] [address], [place], [place] [address]   Culture, Urine     Status: Abnormal   Collection Time: 06/19/17  3:09 PM  Result Value Ref Range Status   Specimen Description URINE, RANDOM  Final   Special Requests   Final    NONE Performed at [facility], [place] [address] [place], [place] [address]    Culture MULTIPLE SPECIES PRESENT, SUGGEST RECOLLECTION (A)  Final   Report Status 06/20/2017 FINAL  Final     Time coordinating discharge: 35 minutes  SIGNED:   Shelly Coss, MD  [facility] 06/24/2017, 1:23 PM Pager [phone]  If 7PM-7AM, please contact night-coverage [email or web address] Password [email or web address]

## 2017-06-24 NOTE — Progress Notes (Signed)
Report called to [facility] - discussed polypharmacy w/clinical nursing supervisor.  Patient has follow-up appointment w/neurology for MG.  PIV x 2 removed. Family educated and signed off on discharge instructions.

## 2017-06-24 NOTE — Evaluation (Signed)
Clinical/Bedside Swallow Evaluation Patient Details  Name: Jennifer [name] MRN: [id number] Date of Birth: 26-Feb-1935  Today's Date: 06/24/2017 Time: SLP Start Time (ACUTE ONLY): 0931 SLP Stop Time (ACUTE ONLY): 0946 SLP Time Calculation (min) (ACUTE ONLY): 15 min  Past Medical History:  Past Medical History:  Diagnosis Date  . Anemia   . Anxiety   . Arthritis   . Atypical chest pain    a. 2006 Cath: nl cors;  b. 05/2010 MV: no ischemia;  c. 05/2013 MV: nl EF, no ischemia. d. 06/2016: NST showing no ischemia or prior infarction  . Blood transfusion   . Cardiomyopathy, nonischemic ([place])    a. 2006 EF initially 30%;  b. 10/2009 Echo EF now 50-55%; c. 11/2012 Echo: EF 45-50% w/ septal motion abnormality (LBBB), gr1 DD, mod TR, PASP 45mmHg; d. 05/2013 MV: Nl EF by SPECT.  . Constipation due to pain medication   . Dementia   . Depression   . Diarrhea   . Frequency of urination    at night  . GERD (gastroesophageal reflux disease)   . Glaucoma   . Hypertension   . LBBB (left bundle branch block)   . Myasthenia gravis ([place]) 12/09/2016  . Osteoporosis 04/13/2006  . Paget's disease of bone 11/29/2012  . Psychosis ([place])   . Renal disorder    only has one kidney; Right kidney stopped working after last child was born  . Seizures ([place])    approximately 20 years since last seizure  . Swelling of both ankles    Takes Lasix if needed  . Thyroid disease 04/13/2006   hypothyroidism  . TIA (transient ischemic attack)    Past Surgical History:  Past Surgical History:  Procedure Laterality Date  . BREAST LUMPECTOMY Left    x 2  . CARDIAC CATHETERIZATION    . CHOLECYSTECTOMY    . ESOPHAGOGASTRODUODENOSCOPY N/A 11/30/2012   Procedure: ESOPHAGOGASTRODUODENOSCOPY (EGD);  Surgeon: Wonda Horner, MD;  Location: [facility] ENDOSCOPY;  Service: Endoscopy;  Laterality: N/A;  . EYE SURGERY     cataract removal pt unsure which eye  . FOOT SURGERY Left   . JOINT REPLACEMENT Bilateral    knees  .  NEPHRECTOMY  1963  . PATELLAR TENDON REPAIR Right 07/12/2013   Procedure: PRIMARY LEFT PATELLA TENDON REPAIR;  Surgeon: Kerin Salen, MD;  Location: [place];  Service: Orthopedics;  Laterality: Right;  . SHOULDER SURGERY Right   . TONSILLECTOMY    . TOTAL KNEE REVISION Right 06/19/2013   Procedure: RIGHT TOTAL KNEE REVISION;  Surgeon: Kerin Salen, MD;  Location: [place];  Service: Orthopedics;  Laterality: Right;  . TUBAL LIGATION     HPI:  82 year old female w/ a history of myasthenia gravis, nonischemic cardiomyopathy with left ventricle ejection fraction 45 to 50%, CKD stage III, and chronic pain syndrome who presented lethargic and hypoxic. She was felt to be suffering w/ TME due to sedating medications.  Once her MS began to improve she was found to also be suffering a possible myasthenia crisis.     Assessment / Plan / Recommendation Clinical Impression  Patient presents with oropharyngeal swallow which appears at bedside to be within functional limits with adequate airway protection. No overt signs of aspiration observed with regular solid or with thin liquids despite challenging with consecutive straw sips of thin liquids in excess of 3oz. Mastication with regular solid is prolonged given dentition, and pt complains of increased effort, fatigue after chewing. Anticipate increasing fatigue possible over the  duration of a meal. She dislikes puree diet and refuses purees. Pt reports she wants to eat "regular food"; she feels soft foods would help reduce fatigue with chewing. SLP educated re: energy conservation strategies, including eating smaller, more frequent meals. Recommend mechanical soft (dys 3) diet with thin liquids, no further skilled ST needs identified. Will s/o.   SLP Visit Diagnosis: Dysphagia, unspecified (R13.10)    Aspiration Risk  Mild aspiration risk    Diet Recommendation Dysphagia 3 (Mech soft);Thin liquid   Liquid Administration via: Cup;Straw Medication Administration:  Whole meds with liquid Supervision: Patient able to self feed Compensations: Slow rate;Small sips/bites;Follow solids with liquid(smaller, more frequent meals for energy conservation) Postural Changes: Seated upright at 90 degrees    Other  Recommendations Oral Care Recommendations: Oral care BID   Follow up Recommendations Skilled Nursing facility      Frequency and Duration            Prognosis Prognosis for Safe Diet Advancement: Fair(MG, dentition)      Swallow Study   General Date of Onset: 06/14/17 HPI: 82 year old female w/ a history of myasthenia gravis, nonischemic cardiomyopathy with left ventricle ejection fraction 45 to 50%, CKD stage III, and chronic pain syndrome who presented lethargic and hypoxic. She was felt to be suffering w/ TME due to sedating medications.  Once her MS began to improve she was found to also be suffering a possible myasthenia crisis.   Type of Study: Bedside Swallow Evaluation Previous Swallow Assessment: none in chart Diet Prior to this Study: Dysphagia 1 (puree);Thin liquids Temperature Spikes Noted: No Respiratory Status: Nasal cannula History of Recent Intubation: No Behavior/Cognition: Alert;Requires cueing Oral Cavity Assessment: Within Functional Limits Oral Care Completed by SLP: No Oral Cavity - Dentition: Edentulous Vision: Functional for self-feeding Self-Feeding Abilities: Able to feed self Patient Positioning: Upright in bed Baseline Vocal Quality: Normal Volitional Cough: Strong Volitional Swallow: Able to elicit    Oral/Motor/Sensory Function Overall Oral Motor/Sensory Function: Within functional limits   Ice Chips Ice chips: Not tested   Thin Liquid Thin Liquid: Within functional limits Presentation: Cup;Straw    Nectar Thick Nectar Thick Liquid: Not tested   Honey Thick Honey Thick Liquid: Not tested   Puree Puree: Not tested Other Comments: (pt declined)   Solid   GO   Solid: Impaired Presentation: Self  Fed Oral Phase Impairments: Impaired mastication Oral Phase Functional Implications: Impaired mastication       Deneise Lever, [place], CCC-SLP Speech-Language Pathologist [phone]  Aliene Altes 06/24/2017,9:51 AM

## 2017-07-13 ENCOUNTER — Ambulatory Visit (INDEPENDENT_AMBULATORY_CARE_PROVIDER_SITE_OTHER): Payer: Medicare Other | Admitting: Neurology

## 2017-07-13 ENCOUNTER — Encounter: Payer: Self-pay | Admitting: Neurology

## 2017-07-13 VITALS — BP 170/82 | HR 79

## 2017-07-13 DIAGNOSIS — G7000 Myasthenia gravis without (acute) exacerbation: Secondary | ICD-10-CM

## 2017-07-13 MED ORDER — PREDNISONE 5 MG PO TABS: 15.0000 mg | ORAL_TABLET | Freq: Every day | ORAL | 3 refills | Status: DC

## 2017-07-13 NOTE — Progress Notes (Signed)
Reason for visit: Myasthenia gravis  Jennifer [name] is an 82 y.o. female  History of present illness:  [name] is a 82 year old left-handed black female with history of myasthenia gravis.  The patient primarily has ocular features, she will have some occasional events of blurred vision, she denies any significant problems with double vision.  The patient has not had problems with ptosis of the eyelids.  She was just recently in the hospital for oversedation from multiple medications, many of these medications were reduced or discontinued.  The patient is now not sleeping well at night.  She is on low-dose trazodone at 25 mg at night.  She continues on a lower dose of the fentanyl patch, she has been taken off of her benzodiazepine medication.  The patient overall is doing fairly well in regards to her myasthenia gravis, she denies any issues with swallowing or choking or difficulty with chewing.  The patient does not ambulate secondary to severe right knee arthritis.  She has not reported any weakness of the extremities otherwise.  Past Medical History:  Diagnosis Date  . Anemia   . Anxiety   . Arthritis   . Atypical chest pain    a. 2006 Cath: nl cors;  b. 05/2010 MV: no ischemia;  c. 05/2013 MV: nl EF, no ischemia. d. 06/2016: NST showing no ischemia or prior infarction  . Blood transfusion   . Cardiomyopathy, nonischemic ([place])    a. 2006 EF initially 30%;  b. 10/2009 Echo EF now 50-55%; c. 11/2012 Echo: EF 45-50% w/ septal motion abnormality (LBBB), gr1 DD, mod TR, PASP 27mmHg; d. 05/2013 MV: Nl EF by SPECT.  . Constipation due to pain medication   . Dementia   . Depression   . Diarrhea   . Frequency of urination    at night  . GERD (gastroesophageal reflux disease)   . Glaucoma   . Hypertension   . LBBB (left bundle branch block)   . Myasthenia gravis ([place]) 12/09/2016  . Osteoporosis 04/13/2006  . Paget's disease of bone 11/29/2012  . Psychosis ([place])   . Renal disorder    only has one kidney; Right kidney stopped working after last child was born  . Seizures ([place])    approximately 20 years since last seizure  . Swelling of both ankles    Takes Lasix if needed  . Thyroid disease 04/13/2006   hypothyroidism  . TIA (transient ischemic attack)     Past Surgical History:  Procedure Laterality Date  . BREAST LUMPECTOMY Left    x 2  . CARDIAC CATHETERIZATION    . CHOLECYSTECTOMY    . ESOPHAGOGASTRODUODENOSCOPY N/A 11/30/2012   Procedure: ESOPHAGOGASTRODUODENOSCOPY (EGD);  Surgeon: Jennifer Horner, MD;  Location: [facility] END[facility]COPY;  Service: Endoscopy;  Laterality: N/A;  . EYE SURGERY     cataract removal pt unsure which eye  . FOOT SURGERY Left   . JOINT REPLACEMENT Bilateral    knees  . NEPHRECTOMY  1963  . PATELLAR TENDON REPAIR Right 07/12/2013   Procedure: PRIMARY LEFT PATELLA TENDON REPAIR;  Surgeon: Jennifer Salen, MD;  Location: [place];  Service: Orthopedics;  Laterality: Right;  . SHOULDER SURGERY Right   . TONSILLECTOMY    . TOTAL KNEE REVISION Right 06/19/2013   Procedure: RIGHT TOTAL KNEE REVISION;  Surgeon: Jennifer Salen, MD;  Location: [place];  Service: Orthopedics;  Laterality: Right;  . TUBAL LIGATION      Family History  Problem Relation Age of  Onset  . Stomach cancer Mother   . Diabetes Brother   . Heart attack Brother     Social history:  reports that she has never smoked. Her smokeless tobacco use includes snuff. She reports that she does not drink alcohol or use drugs.    Allergies  Allergen Reactions  . Codeine Nausea Only    Medications:  Prior to Admission medications   Medication Sig Start Date End Date Taking? Authorizing Provider  acetaminophen (TYLENOL) 500 MG tablet Take 500 mg by mouth every 4 (four) hours as needed for mild pain or headache (or fever of 99.5-101 F).    Yes [provider]  alum & mag hydroxide-simeth ([place]) 200-200-20 MG/5ML suspension Take 30 mLs by mouth every 6 (six) hours as needed for  indigestion or heartburn. AND CANNOT EXCEED 4 DOSES IN 24 HOURS   Yes [provider]  amLODipine (NORVASC) 5 MG tablet Take 1 tablet (5 mg total) by mouth daily. 06/25/17  Yes Jennifer Coss, MD  ARIPiprazole (ABILIFY) 2 MG tablet Take 2 mg by mouth daily.   Yes [provider]  aspirin 81 MG chewable tablet Chew 81 mg by mouth daily.   Yes [provider]  azaTHIOprine (IMURAN) 50 MG tablet Take 2 tablets (100 mg total) by mouth daily. 03/13/17  Yes Jennifer Ducking, MD  buPROPion [facility] SR) 150 MG 12 hr tablet Take 150 mg by mouth daily.   Yes [provider]  Carboxymethylcellulose Sodium (REFRESH LIQUIGEL OP) Place 1 drop into both eyes 2 (two) times daily as needed (for dryness).   Yes [provider]  carvedilol (COREG) 12.5 MG tablet Take 12.5 mg by mouth 2 (two) times daily with a meal. HOLD FOR SYSTOLIC B/P <263 or HR <78   Yes [provider]  Cholecalciferol (VITAMIN D3) 2000 units TABS Take 2,000 Units by mouth daily.   Yes [provider]  cloNIDine (CATAPRES) 0.1 MG tablet Take 1 tablet (0.1 mg total) by mouth 3 (three) times daily. 06/24/17  Yes Jennifer Coss, MD  cycloSPORINE (RESTASIS) 0.05 % ophthalmic emulsion Place 1 drop into both eyes 2 (two) times daily.   Yes [provider]  diclofenac sodium (VOLTAREN) 1 % GEL Apply 2 g topically 4 (four) times daily. Rub into affected area of foot 2 to 4 times daily Patient taking differently: Apply 2 g topically See admin instructions. Apply 2 grams topically to both feet two times a day for pain 03/10/16  Yes Jennifer [name], DPM  dorzolamidel-timolol (COSOPT PF) 22.3-6.8 MG/ML SOLN ophthalmic solution Place 1 drop into both eyes 2 (two) times daily.   Yes [provider]  fentaNYL (DURAGESIC - DOSED MCG/HR) 25 MCG/HR patch Place 1 patch (25 mcg total) onto the skin every 3 (three) days. REMOVE OLD PATCH FIRST 06/24/17  Yes Jennifer Coss, MD  ferrous  sulfate 325 (65 FE) MG tablet Take 325 mg by mouth every Monday, Wednesday, and Friday.    Yes [provider]  isosorbide dinitrate (ISORDIL) 30 MG tablet Take 30 mg by mouth 4 (four) times daily. HOLD FOR SYSTOLIC B/P <588   Yes [provider]  losartan (COZAAR) 25 MG tablet Take 1 tablet by mouth 2 (two) times daily. HOLD FOR SYSTOLIC B/P <502 7/74/12  Yes [provider]  Melatonin 3 MG TABS Take 3 mg by mouth at bedtime.   Yes [provider]  Menthol, Topical Analgesic, (BIOFREEZE) 4 % GEL Apply 1 Dose topically every 4 (  four) hours as needed (Apply to right side as needed).   Yes [provider]  Multiple Vitamins-Minerals (CERTAVITE/ANTIOXIDANTS) TABS Take 1 tablet by mouth daily.   Yes [provider]  pantoprazole (PROTONIX) 20 MG tablet Take 20 mg by mouth daily.   Yes [provider]  polyethylene glycol (MIRALAX / GLYCOLAX) packet Take 17 g by mouth daily as needed (FOR CONSTIPATION). MIXED INTO 4-8 OUNCES OF LIQUID   Yes [provider]  pyridostigmine (MESTINON) 60 MG tablet Take 0.5 tablets (30 mg total) by mouth 3 (three) times daily. 03/13/17  Yes Jennifer Ducking, MD  saccharomyces boulardii (FLORASTOR) 250 MG capsule Take 250 mg by mouth daily.   Yes [provider]  traZODone (DESYREL) 50 MG tablet Take 25 mg by mouth at bedtime.   Yes [provider]  vitamin B-12 (CYANOCOBALAMIN) 100 MCG tablet Take 50 mcg by mouth daily.    Yes [provider]  furosemide (LASIX) 20 MG tablet Take 1 tablet (20 mg total) by mouth daily. Patient taking differently: Take 20 mg by mouth daily. HOLD FOR SYSTOLIC B/P <811 10/28/76 06/14/17  Strader, Fransisco Hertz, PA-C  LORazepam (ATIVAN) 0.5 MG tablet Take 0.5-1 mg by mouth See admin instructions. Take 0.5 mg by mouth in the morning and 1 mg at bedtime    [provider]  NONFORMULARY OR COMPOUNDED [place]:  "Authorized Substitute"  Pain Cream Formulation - Ibuprofen 15%, Gaclofen 1%, Gabapentin 3%, Lidocaine 2%, apply 2 grams to affected area 3 times daily. Patient not taking: Reported on 06/14/2017 03/16/16   Jennifer [name], DPM  predniSONE (DELTASONE) 5 MG tablet Take 3 tablets (15 mg total) by mouth daily with breakfast. 07/13/17   Jennifer Ducking, MD    ROS:  Out of a complete 14 system review of symptoms, the patient complains only of the following symptoms, and all other reviewed systems are negative.  Insomnia  Blood pressure (!) 170/82, pulse 79, SpO2 96 %.  Physical Exam  General: The patient is alert and cooperative at the time of the examination.  The patient is markedly obese.  Skin: 1-2+ edema below the knees is seen bilaterally.   Neurologic Exam  Mental status: The patient is alert and oriented x 3 at the time of the examination. The patient has apparent normal recent and remote memory, with an apparently normal attention span and concentration ability.   Cranial nerves: Facial symmetry is present. Speech is normal, no aphasia or dysarthria is noted. Extraocular movements are full. Visual fields are full.  With superior gaze for 1 minute, the patient reports some double vision at 40 seconds, there is slight exotropia of the right eye.  No ptosis is seen.  Motor: The patient has good strength in all 4 extremities.  Sensory examination: Soft touch sensation is symmetric on the face, arms, and legs.  Coordination: The patient has good finger-nose-finger bilaterally.  The patient has difficulty performing heel shin bilaterally secondary to imitation of hip movements.  Gait and station: The patient is wheelchair-bound, she cannot ambulate.  Reflexes: Deep tendon reflexes are symmetric.   Assessment/Plan:  1.  Myasthenia gravis  2.  Recent hospital admission, oversedation  The patient did receive IVIG for presumed myasthenic exacerbation, but the patient likely had issues related to  oversedation.  The patient has reduced her medications, she is doing fairly well.  We will reduce the prednisone to 15 mg daily for 6 weeks and then go to 12.5 mg  daily.  The patient will follow-up in 5 months.  The patient has had recent blood work done through the hospital.  Jill Alexanders MD 07/13/2017 2:18 PM  [facility] [address] [place], [place] [address]  Phone [phone] Fax [phone]

## 2017-07-13 NOTE — Patient Instructions (Signed)
We will reduce the prednisone dose to 15 mg a day for 6 weeks, then begin 12.5 mg daily.  Increase the trazodone for sleep to 50 mg at night.

## 2018-01-04 ENCOUNTER — Telehealth: Payer: Self-pay | Admitting: Neurology

## 2018-01-04 ENCOUNTER — Ambulatory Visit (INDEPENDENT_AMBULATORY_CARE_PROVIDER_SITE_OTHER): Payer: Medicare Other | Admitting: Neurology

## 2018-01-04 ENCOUNTER — Encounter: Payer: Self-pay | Admitting: Neurology

## 2018-01-04 VITALS — BP 136/68 | HR 68 | Resp 22 | Ht 64.0 in | Wt 210.0 lb

## 2018-01-04 DIAGNOSIS — Z5181 Encounter for therapeutic drug level monitoring: Secondary | ICD-10-CM

## 2018-01-04 DIAGNOSIS — M6281 Muscle weakness (generalized): Secondary | ICD-10-CM | POA: Diagnosis not present

## 2018-01-04 MED ORDER — PREDNISONE 5 MG PO TABS: 20.0000 mg | ORAL_TABLET | Freq: Every day | ORAL | 3 refills | Status: AC

## 2018-01-04 NOTE — Progress Notes (Signed)
Reason for visit: Myasthenia gravis  Jennifer [name] is an 82 y.o. female  History of present illness:  Jennifer [name] is an 82 year old left-handed black female with a history of myasthenia gravis.  The patient currently is on Mestinon, prednisone, and Imuran.  She has gone down to 15 mg prednisone daily.  She was in the hospital on 14 Jun 2017 with altered mental status and fatigue, some respiratory compromise.  The patient was felt to have issues with polypharmacy with oversedation from multiple medications including lorazepam, trazodone, melatonin, hydrocodone, fentanyl, and gabapentin.  The patient was also felt to have some decompensation of her myasthenia, she was treated with IVIG.  The patient seemed to recover and was discharged.  The patient comes in with a caretaker today.  Since the hospitalization in May, the patient has had ongoing urinary incontinence.  Within the last several months, her appetite has dropped out, she is losing weight, she complains of abdominal pain.  She has had some nausea.  The patient currently is on Imuran, 15 mg daily of prednisone, and Mestinon 30 mg 3 times daily.  Within the last 2 weeks, the patient has had significant worsening of weakness of all 4 extremities.  She has a pressure sore on her bottom, she has started to complain of significant neck pain and right shoulder discomfort.  The patient is nonambulatory, but her ability to help her self sit up has dramatically declined.  She returns for an evaluation.  Past Medical History:  Diagnosis Date  . Anemia   . Anxiety   . Arthritis   . Atypical chest pain    a. 2006 Cath: nl cors;  b. 05/2010 MV: no ischemia;  c. 05/2013 MV: nl EF, no ischemia. d. 06/2016: NST showing no ischemia or prior infarction  . Blood transfusion   . Cardiomyopathy, nonischemic ([place])    a. 2006 EF initially 30%;  b. 10/2009 Echo EF now 50-55%; c. 11/2012 Echo: EF 45-50% w/ septal motion abnormality (LBBB), gr1 DD, mod TR, PASP  70mmHg; d. 05/2013 MV: Nl EF by SPECT.  . Constipation due to pain medication   . Dementia   . Depression   . Diarrhea   . Frequency of urination    at night  . GERD (gastroesophageal reflux disease)   . Glaucoma   . Hypertension   . LBBB (left bundle branch block)   . Myasthenia gravis ([place]) 12/09/2016  . Osteoporosis 04/13/2006  . Paget's disease of bone 11/29/2012  . Psychosis ([place])   . Renal disorder    only has one kidney; Right kidney stopped working after last child was born  . Seizures ([place])    approximately 20 years since last seizure  . Swelling of both ankles    Takes Lasix if needed  . Thyroid disease 04/13/2006   hypothyroidism  . TIA (transient ischemic attack)     Past Surgical History:  Procedure Laterality Date  . BREAST LUMPECTOMY Left    x 2  . CARDIAC CATHETERIZATION    . CHOLECYSTECTOMY    . ESOPHAGOGASTRODUODENOSCOPY N/A 11/30/2012   Procedure: ESOPHAGOGASTRODUODENOSCOPY (EGD);  Surgeon: Wonda Horner, MD;  Location: [facility] ENDOSCOPY;  Service: Endoscopy;  Laterality: N/A;  . EYE SURGERY     cataract removal pt unsure which eye  . FOOT SURGERY Left   . JOINT REPLACEMENT Bilateral    knees  . NEPHRECTOMY  1963  . PATELLAR TENDON REPAIR Right 07/12/2013   Procedure: PRIMARY LEFT PATELLA TENDON  REPAIR;  Surgeon: Kerin Salen, MD;  Location: [place];  Service: Orthopedics;  Laterality: Right;  . SHOULDER SURGERY Right   . TONSILLECTOMY    . TOTAL KNEE REVISION Right 06/19/2013   Procedure: RIGHT TOTAL KNEE REVISION;  Surgeon: Kerin Salen, MD;  Location: [place];  Service: Orthopedics;  Laterality: Right;  . TUBAL LIGATION      Family History  Problem Relation Age of Onset  . Stomach cancer Mother   . Diabetes Brother   . Heart attack Brother     Social history:  reports that she has never smoked. Her smokeless tobacco use includes snuff. She reports that she does not drink alcohol or use drugs.    Allergies  Allergen Reactions  . Codeine Nausea  Only    Medications:  Prior to Admission medications   Medication Sig Start Date End Date Taking? Authorizing Provider  acetaminophen (TYLENOL) 500 MG tablet Take 500 mg by mouth every 4 (four) hours as needed for mild pain or headache (or fever of 99.5-101 F).     [provider]  alum & mag hydroxide-simeth ([place]) 200-200-20 MG/5ML suspension Take 30 mLs by mouth every 6 (six) hours as needed for indigestion or heartburn. AND CANNOT EXCEED 4 DOSES IN 24 HOURS    [provider]  amLODipine (NORVASC) 5 MG tablet Take 1 tablet (5 mg total) by mouth daily. 06/25/17   Shelly Coss, MD  ARIPiprazole (ABILIFY) 2 MG tablet Take 2 mg by mouth daily.    [provider]  aspirin 81 MG chewable tablet Chew 81 mg by mouth daily.    [provider]  azaTHIOprine (IMURAN) 50 MG tablet Take 2 tablets (100 mg total) by mouth daily. 03/13/17   Kathrynn Ducking, MD  buPROPion (WELLBUTRIN SR) 150 MG 12 hr tablet Take 150 mg by mouth daily.    [provider]  Carboxymethylcellulose Sodium (REFRESH LIQUIGEL OP) Place 1 drop into both eyes 2 (two) times daily as needed (for dryness).    [provider]  carvedilol (COREG) 12.5 MG tablet Take 12.5 mg by mouth 2 (two) times daily with a meal. HOLD FOR SYSTOLIC B/P <175 or HR <10    [provider]  Cholecalciferol (VITAMIN D3) 2000 units TABS Take 2,000 Units by mouth daily.    [provider]  cloNIDine (CATAPRES) 0.1 MG tablet Take 1 tablet (0.1 mg total) by mouth 3 (three) times daily. 06/24/17   Shelly Coss, MD  cycloSPORINE (RESTASIS) 0.05 % ophthalmic emulsion Place 1 drop into both eyes 2 (two) times daily.    [provider]  diclofenac sodium (VOLTAREN) 1 % GEL Apply 2 g topically 4 (four) times daily. Rub into affected area of foot 2 to 4 times daily Patient taking differently: Apply 2 g topically See admin instructions. Apply 2 grams topically to both feet two times a  day for pain 03/10/16   Trula Slade, DPM  dorzolamidel-timolol (COSOPT PF) 22.3-6.8 MG/ML SOLN ophthalmic solution Place 1 drop into both eyes 2 (two) times daily.    [provider]  fentaNYL (DURAGESIC - DOSED MCG/HR) 25 MCG/HR patch Place 1 patch (25 mcg total) onto the skin every 3 (three) days. REMOVE OLD [facility] FIRST 06/24/17   Shelly Coss, MD  ferrous sulfate 325 (65 FE) MG tablet Take 325 mg by mouth every Monday, Wednesday, and Friday.     [provider]  furosemide (LASIX) 20 MG tablet Take 1 tablet (20 mg  total) by mouth daily. Patient taking differently: Take 20 mg by mouth daily. HOLD FOR SYSTOLIC B/P <086 7/61/95 06/14/17  Strader, Fransisco Hertz, PA-C  isosorbide dinitrate (ISORDIL) 30 MG tablet Take 30 mg by mouth 4 (four) times daily. HOLD FOR SYSTOLIC B/P <093    [provider]  LORazepam (ATIVAN) 0.5 MG tablet Take 0.5-1 mg by mouth See admin instructions. Take 0.5 mg by mouth in the morning and 1 mg at bedtime    [provider]  losartan (COZAAR) 25 MG tablet Take 1 tablet by mouth 2 (two) times daily. HOLD FOR SYSTOLIC B/P <267 03/09/56   [provider]  Melatonin 3 MG TABS Take 3 mg by mouth at bedtime.    [provider]  Menthol, Topical Analgesic, (BIOFREEZE) 4 % GEL Apply 1 Dose topically every 4 (four) hours as needed (Apply to right side as needed).    [provider]  Multiple Vitamins-Minerals (CERTAVITE/ANTIOXIDANTS) TABS Take 1 tablet by mouth daily.    [provider]  NONFORMULARY OR COMPOUNDED ITEM [facility]:  "Authorized Substitute" Pain Cream Formulation - Ibuprofen 15%, Gaclofen 1%, Gabapentin 3%, Lidocaine 2%, apply 2 grams to affected area 3 times daily. Patient not taking: Reported on 06/14/2017 03/16/16   Trula Slade, DPM  pantoprazole (PROTONIX) 20 MG tablet Take 20 mg by mouth daily.    [provider]  polyethylene glycol (MIRALAX / GLYCOLAX) packet Take 17 g  by mouth daily as needed (FOR CONSTIPATION). MIXED INTO 4-8 OUNCES OF LIQUID    [provider]  predniSONE (DELTASONE) 5 MG tablet Take 3 tablets (15 mg total) by mouth daily with breakfast. 07/13/17   Kathrynn Ducking, MD  pyridostigmine (MESTINON) 60 MG tablet Take 0.5 tablets (30 mg total) by mouth 3 (three) times daily. 03/13/17   Kathrynn Ducking, MD  saccharomyces boulardii (FLORASTOR) 250 MG capsule Take 250 mg by mouth daily.    [provider]  traZODone (DESYREL) 50 MG tablet Take 25 mg by mouth at bedtime.    [provider]  vitamin B-12 (CYANOCOBALAMIN) 100 MCG tablet Take 50 mcg by mouth daily.     [provider]    ROS:  Out of a complete 14 system review of symptoms, the patient complains only of the following symptoms, and all other reviewed systems are negative.  Neck pain, shoulder pain Decreased appetite Nausea Weakness  There were no vitals taken for this visit.  Physical Exam  General: The patient is alert and cooperative at the time of the examination.  The patient is moderately obese.  Skin: No significant peripheral edema is noted.   Neurologic Exam  Mental status: The patient is alert and oriented x 3 at the time of the examination. The patient has apparent normal recent and remote memory, with an apparently normal attention span and concentration ability.   Cranial nerves: Facial symmetry is present. Speech is normal, no aphasia or dysarthria is noted. Extraocular movements are full. Visual fields are full.  No ptosis is seen.  Motor: The patient has 4-/5 strength of the deltoid muscles bilaterally, she has fair strength with grip and with biceps and triceps strength.  The patient has 2/5 strength throughout the lower extremities, she is not able to lift the legs against gravity.  Sensory examination: Soft touch sensation is symmetric on the face, arms, and legs.  Coordination: The patient has good  finger-nose-finger bilaterally.  The patient is not able to perform heel-to-shin on either  side.  Gait and station: The patient is wheelchair-bound, she is unable to ambulate.  Reflexes: Deep tendon reflexes are symmetric.   Assessment/Plan:  1.  Myasthenia gravis  2.  Worsening generalized weakness  3.  Neck pain, right shoulder pain  The patient has had a clear change in her functional level.  She has developed significant weakness, she has developed urinary incontinence and she has neck pain and right shoulder pain.  MRI of the cervical spine will be done to exclude a cervical myelopathy.  The prednisone dose will be increased to 20 mg daily, she will remain on the Mestinon and Imuran.  Given the nausea, blood work will be done today, we will include an amylase level.  Imuran can sometimes cause pancreatitis.  The patient will follow-up in 3 months.  A urinalysis will be done at the extended care facility.  Jill Alexanders MD 01/04/2018 9:44 AM  [facility] [address] [place], [place] [address]  Phone [phone] Fax [phone]

## 2018-01-04 NOTE — Patient Instructions (Signed)
We will go up on the prednisone to 20 mg daily.

## 2018-01-04 NOTE — Telephone Encounter (Signed)
[employer] Medicare/medicaid no auth patient is scheduled at [facility] for 01/17/18 arrival time is 1:30 I left a vmail of all this detail with Rosine Door at her assisting living place.

## 2018-01-05 ENCOUNTER — Encounter (HOSPITAL_COMMUNITY): Payer: Self-pay

## 2018-01-05 ENCOUNTER — Inpatient Hospital Stay (HOSPITAL_COMMUNITY)
Admission: EM | Admit: 2018-01-05 | Discharge: 2018-02-14 | DRG: 871 | Disposition: E | Payer: Medicare Other | Attending: Family Medicine | Admitting: Family Medicine

## 2018-01-05 ENCOUNTER — Telehealth: Payer: Self-pay | Admitting: Neurology

## 2018-01-05 ENCOUNTER — Emergency Department (HOSPITAL_COMMUNITY): Payer: Medicare Other

## 2018-01-05 DIAGNOSIS — M810 Age-related osteoporosis without current pathological fracture: Secondary | ICD-10-CM | POA: Diagnosis present

## 2018-01-05 DIAGNOSIS — R110 Nausea: Secondary | ICD-10-CM

## 2018-01-05 DIAGNOSIS — G7000 Myasthenia gravis without (acute) exacerbation: Secondary | ICD-10-CM | POA: Diagnosis present

## 2018-01-05 DIAGNOSIS — Z7952 Long term (current) use of systemic steroids: Secondary | ICD-10-CM

## 2018-01-05 DIAGNOSIS — Z79891 Long term (current) use of opiate analgesic: Secondary | ICD-10-CM

## 2018-01-05 DIAGNOSIS — N184 Chronic kidney disease, stage 4 (severe): Secondary | ICD-10-CM | POA: Diagnosis present

## 2018-01-05 DIAGNOSIS — I428 Other cardiomyopathies: Secondary | ICD-10-CM | POA: Diagnosis present

## 2018-01-05 DIAGNOSIS — D631 Anemia in chronic kidney disease: Secondary | ICD-10-CM | POA: Diagnosis present

## 2018-01-05 DIAGNOSIS — F411 Generalized anxiety disorder: Secondary | ICD-10-CM | POA: Diagnosis not present

## 2018-01-05 DIAGNOSIS — I5022 Chronic systolic (congestive) heart failure: Secondary | ICD-10-CM | POA: Diagnosis not present

## 2018-01-05 DIAGNOSIS — E209 Hypoparathyroidism, unspecified: Secondary | ICD-10-CM | POA: Diagnosis present

## 2018-01-05 DIAGNOSIS — I447 Left bundle-branch block, unspecified: Secondary | ICD-10-CM | POA: Diagnosis present

## 2018-01-05 DIAGNOSIS — R6521 Severe sepsis with septic shock: Secondary | ICD-10-CM | POA: Diagnosis present

## 2018-01-05 DIAGNOSIS — R1084 Generalized abdominal pain: Secondary | ICD-10-CM | POA: Diagnosis not present

## 2018-01-05 DIAGNOSIS — Z885 Allergy status to narcotic agent status: Secondary | ICD-10-CM

## 2018-01-05 DIAGNOSIS — F0390 Unspecified dementia without behavioral disturbance: Secondary | ICD-10-CM | POA: Diagnosis present

## 2018-01-05 DIAGNOSIS — F419 Anxiety disorder, unspecified: Secondary | ICD-10-CM | POA: Diagnosis present

## 2018-01-05 DIAGNOSIS — I5032 Chronic diastolic (congestive) heart failure: Secondary | ICD-10-CM | POA: Diagnosis present

## 2018-01-05 DIAGNOSIS — K631 Perforation of intestine (nontraumatic): Secondary | ICD-10-CM | POA: Diagnosis present

## 2018-01-05 DIAGNOSIS — I7409 Other arterial embolism and thrombosis of abdominal aorta: Secondary | ICD-10-CM

## 2018-01-05 DIAGNOSIS — Z993 Dependence on wheelchair: Secondary | ICD-10-CM

## 2018-01-05 DIAGNOSIS — R100 Acute abdomen: Secondary | ICD-10-CM | POA: Diagnosis present

## 2018-01-05 DIAGNOSIS — Z66 Do not resuscitate: Secondary | ICD-10-CM | POA: Diagnosis present

## 2018-01-05 DIAGNOSIS — G8929 Other chronic pain: Secondary | ICD-10-CM | POA: Diagnosis present

## 2018-01-05 DIAGNOSIS — H409 Unspecified glaucoma: Secondary | ICD-10-CM | POA: Diagnosis present

## 2018-01-05 DIAGNOSIS — K219 Gastro-esophageal reflux disease without esophagitis: Secondary | ICD-10-CM | POA: Diagnosis present

## 2018-01-05 DIAGNOSIS — Z515 Encounter for palliative care: Secondary | ICD-10-CM | POA: Diagnosis present

## 2018-01-05 DIAGNOSIS — I959 Hypotension, unspecified: Secondary | ICD-10-CM

## 2018-01-05 DIAGNOSIS — R400 Somnolence: Secondary | ICD-10-CM | POA: Diagnosis not present

## 2018-01-05 DIAGNOSIS — N179 Acute kidney failure, unspecified: Secondary | ICD-10-CM | POA: Diagnosis present

## 2018-01-05 DIAGNOSIS — Z8673 Personal history of transient ischemic attack (TIA), and cerebral infarction without residual deficits: Secondary | ICD-10-CM

## 2018-01-05 DIAGNOSIS — Z905 Acquired absence of kidney: Secondary | ICD-10-CM

## 2018-01-05 DIAGNOSIS — I5023 Acute on chronic systolic (congestive) heart failure: Secondary | ICD-10-CM | POA: Diagnosis not present

## 2018-01-05 DIAGNOSIS — A419 Sepsis, unspecified organism: Principal | ICD-10-CM | POA: Diagnosis present

## 2018-01-05 DIAGNOSIS — I130 Hypertensive heart and chronic kidney disease with heart failure and stage 1 through stage 4 chronic kidney disease, or unspecified chronic kidney disease: Secondary | ICD-10-CM | POA: Diagnosis present

## 2018-01-05 DIAGNOSIS — E785 Hyperlipidemia, unspecified: Secondary | ICD-10-CM | POA: Diagnosis present

## 2018-01-05 DIAGNOSIS — K659 Peritonitis, unspecified: Secondary | ICD-10-CM | POA: Diagnosis not present

## 2018-01-05 DIAGNOSIS — Z96653 Presence of artificial knee joint, bilateral: Secondary | ICD-10-CM | POA: Diagnosis present

## 2018-01-05 DIAGNOSIS — Z720 Tobacco use: Secondary | ICD-10-CM

## 2018-01-05 DIAGNOSIS — Z833 Family history of diabetes mellitus: Secondary | ICD-10-CM

## 2018-01-05 DIAGNOSIS — Z8249 Family history of ischemic heart disease and other diseases of the circulatory system: Secondary | ICD-10-CM

## 2018-01-05 DIAGNOSIS — E039 Hypothyroidism, unspecified: Secondary | ICD-10-CM | POA: Diagnosis present

## 2018-01-05 DIAGNOSIS — R5381 Other malaise: Secondary | ICD-10-CM | POA: Diagnosis present

## 2018-01-05 DIAGNOSIS — Z79899 Other long term (current) drug therapy: Secondary | ICD-10-CM

## 2018-01-05 DIAGNOSIS — E669 Obesity, unspecified: Secondary | ICD-10-CM | POA: Diagnosis present

## 2018-01-05 DIAGNOSIS — L8990 Pressure ulcer of unspecified site, unspecified stage: Secondary | ICD-10-CM

## 2018-01-05 DIAGNOSIS — Z6836 Body mass index (BMI) 36.0-36.9, adult: Secondary | ICD-10-CM

## 2018-01-05 DIAGNOSIS — Z9049 Acquired absence of other specified parts of digestive tract: Secondary | ICD-10-CM

## 2018-01-05 LAB — AMYLASE: AMYLASE: 30 U/L — AB (ref 31–124)

## 2018-01-05 LAB — COMPREHENSIVE METABOLIC PANEL
ALBUMIN: 2.9 g/dL — AB (ref 3.5–4.7)
ALBUMIN: 2.1 g/dL — AB (ref 3.5–5.0)
ALK PHOS: 97 IU/L (ref 39–117)
ALT: 17 IU/L (ref 0–32)
ALT: 19 U/L (ref 0–44)
ANION GAP: 11 (ref 5–15)
AST: 21 IU/L (ref 0–40)
AST: 21 U/L (ref 15–41)
Albumin/Globulin Ratio: 1.2 (ref 1.2–2.2)
Alkaline Phosphatase: 88 U/L (ref 38–126)
BUN / CREAT RATIO: 16 (ref 12–28)
BUN: 59 mg/dL — AB (ref 8–27)
BUN: 66 mg/dL — ABNORMAL HIGH (ref 8–23)
Bilirubin Total: 0.6 mg/dL (ref 0.0–1.2)
CALCIUM: 9.2 mg/dL (ref 8.7–10.3)
CHLORIDE: 106 mmol/L (ref 98–111)
CO2: 21 mmol/L (ref 20–29)
CO2: 22 mmol/L (ref 22–32)
CREATININE: 3.74 mg/dL — AB (ref 0.57–1.00)
CREATININE: 4.55 mg/dL — AB (ref 0.44–1.00)
Calcium: 8.7 mg/dL — ABNORMAL LOW (ref 8.9–10.3)
Chloride: 102 mmol/L (ref 96–106)
GFR calc Af Amer: 12 mL/min/{1.73_m2} — ABNORMAL LOW (ref >59)
GFR calc Af Amer: 10 mL/min — ABNORMAL LOW (ref >60)
GFR calc non Af Amer: 8 mL/min — ABNORMAL LOW (ref >60)
GFR, EST NON AFRICAN AMERICAN: 11 mL/min/{1.73_m2} — AB (ref >59)
GLUCOSE: 73 mg/dL (ref 65–99)
GLUCOSE: 60 mg/dL — AB (ref 70–99)
Globulin, Total: 2.5 g/dL (ref 1.5–4.5)
POTASSIUM: 3.7 mmol/L (ref 3.5–5.1)
Potassium: 4 mmol/L (ref 3.5–5.2)
SODIUM: 139 mmol/L (ref 135–145)
Sodium: 140 mmol/L (ref 134–144)
TOTAL PROTEIN: 5.2 g/dL — AB (ref 6.5–8.1)
Total Bilirubin: 1.1 mg/dL (ref 0.3–1.2)
Total Protein: 5.4 g/dL — ABNORMAL LOW (ref 6.0–8.5)

## 2018-01-05 LAB — CBC WITH DIFFERENTIAL/PLATELET
BASOS: 1 %
Basophils Absolute: 0.1 10*3/uL (ref 0.0–0.2)
EOS (ABSOLUTE): 0 10*3/uL (ref 0.0–0.4)
Eos: 0 %
HEMOGLOBIN: 10.8 g/dL — AB (ref 11.1–15.9)
Hematocrit: 31.3 % — ABNORMAL LOW (ref 34.0–46.6)
IMMATURE GRANULOCYTES: 2 %
Immature Grans (Abs): 0.2 10*3/uL — ABNORMAL HIGH (ref 0.0–0.1)
Lymphocytes Absolute: 0.7 10*3/uL (ref 0.7–3.1)
Lymphs: 8 %
MCH: 33.1 pg — ABNORMAL HIGH (ref 26.6–33.0)
MCHC: 34.5 g/dL (ref 31.5–35.7)
MCV: 96 fL (ref 79–97)
MONOCYTES: 3 %
Monocytes Absolute: 0.3 10*3/uL (ref 0.1–0.9)
NEUTROS PCT: 86 %
NRBC: 1 % — AB (ref 0–0)
Neutrophils Absolute: 6.8 10*3/uL (ref 1.4–7.0)
PLATELETS: 249 10*3/uL (ref 150–450)
RBC: 3.26 x10E6/uL — AB (ref 3.77–5.28)
RDW: 14.8 % (ref 12.3–15.4)
WBC: 8 10*3/uL (ref 3.4–10.8)

## 2018-01-05 LAB — CBC
HEMATOCRIT: 32.2 % — AB (ref 36.0–46.0)
Hemoglobin: 10.2 g/dL — ABNORMAL LOW (ref 12.0–15.0)
MCH: 33.1 pg (ref 26.0–34.0)
MCHC: 31.7 g/dL (ref 30.0–36.0)
MCV: 104.5 fL — ABNORMAL HIGH (ref 80.0–100.0)
Platelets: 192 10*3/uL (ref 150–400)
RBC: 3.08 MIL/uL — ABNORMAL LOW (ref 3.87–5.11)
RDW: 17.1 % — AB (ref 11.5–15.5)
WBC: 9.5 10*3/uL (ref 4.0–10.5)
nRBC: 0.4 % — ABNORMAL HIGH (ref 0.0–0.2)

## 2018-01-05 LAB — URINALYSIS, ROUTINE W REFLEX MICROSCOPIC
Bilirubin Urine: NEGATIVE
Glucose, UA: NEGATIVE mg/dL
Hgb urine dipstick: NEGATIVE
KETONES UR: NEGATIVE mg/dL
NITRITE: NEGATIVE
PH: 5 (ref 5.0–8.0)
PROTEIN: NEGATIVE mg/dL
Specific Gravity, Urine: 1.018 (ref 1.005–1.030)

## 2018-01-05 LAB — CBG MONITORING, ED
GLUCOSE-CAPILLARY: 53 mg/dL — AB (ref 70–99)
GLUCOSE-CAPILLARY: 139 mg/dL — AB (ref 70–99)

## 2018-01-05 LAB — LIPASE, BLOOD: LIPASE: 20 U/L (ref 11–51)

## 2018-01-05 MED ORDER — MORPHINE SULFATE (PF) 2 MG/ML IV SOLN
2.0000 mg | INTRAVENOUS | Status: DC | PRN
  Filled 2018-01-05: qty 1

## 2018-01-05 MED ORDER — PIPERACILLIN-TAZOBACTAM IN DEX 2-0.25 GM/50ML IV SOLN
2.2500 g | Freq: Four times a day (QID) | INTRAVENOUS | Status: DC
  Filled 2018-01-05 (×2): qty 50

## 2018-01-05 MED ORDER — SODIUM CHLORIDE 0.9 % IV SOLN
8.0000 mg | Freq: Four times a day (QID) | INTRAVENOUS | Status: DC | PRN
  Filled 2018-01-05: qty 4

## 2018-01-05 MED ORDER — LORAZEPAM 2 MG/ML IJ SOLN
1.0000 mg | INTRAMUSCULAR | Status: DC | PRN
  Administered 2018-01-09 – 2018-01-12 (×2): 1 mg via INTRAVENOUS
  Filled 2018-01-05 (×2): qty 1

## 2018-01-05 MED ORDER — ONDANSETRON HCL 4 MG/2ML IJ SOLN
4.0000 mg | Freq: Once | INTRAMUSCULAR | Status: AC
  Administered 2018-01-05: 4 mg via INTRAVENOUS
  Filled 2018-01-05: qty 2

## 2018-01-05 MED ORDER — LACTATED RINGERS IV SOLN
INTRAVENOUS | Status: DC
  Administered 2018-01-05: 14:00:00 via INTRAVENOUS

## 2018-01-05 MED ORDER — LORAZEPAM 2 MG/ML PO CONC
1.0000 mg | ORAL | Status: DC | PRN
  Administered 2018-01-07 – 2018-01-10 (×3): 1 mg via SUBLINGUAL
  Filled 2018-01-05 (×3): qty 1

## 2018-01-05 MED ORDER — SODIUM CHLORIDE 0.9 % IV BOLUS
500.0000 mL | Freq: Once | INTRAVENOUS | Status: AC
  Administered 2018-01-05: 500 mL via INTRAVENOUS

## 2018-01-05 MED ORDER — MORPHINE SULFATE (PF) 2 MG/ML IV SOLN
1.0000 mg | INTRAVENOUS | Status: DC | PRN
  Administered 2018-01-05 – 2018-01-06 (×3): 1 mg via INTRAVENOUS
  Filled 2018-01-05 (×3): qty 1

## 2018-01-05 MED ORDER — DEXTROSE 50 % IV SOLN
1.0000 | Freq: Once | INTRAVENOUS | Status: DC
  Filled 2018-01-05: qty 50

## 2018-01-05 MED ORDER — LORAZEPAM 1 MG PO TABS: 1.0000 mg | ORAL_TABLET | ORAL | Status: DC | PRN

## 2018-01-05 MED ORDER — DEXTROSE 50 % IV SOLN
INTRAVENOUS | Status: AC
  Administered 2018-01-05: 50 mL
  Filled 2018-01-05: qty 50

## 2018-01-05 MED ORDER — SODIUM CHLORIDE 0.9 % IV BOLUS
1000.0000 mL | Freq: Once | INTRAVENOUS | Status: AC
  Administered 2018-01-05: 1000 mL via INTRAVENOUS

## 2018-01-05 MED ORDER — PIPERACILLIN-TAZOBACTAM 3.375 G IVPB 30 MIN
3.3750 g | Freq: Once | INTRAVENOUS | Status: AC
  Administered 2018-01-05: 3.375 g via INTRAVENOUS
  Filled 2018-01-05: qty 50

## 2018-01-05 NOTE — ED Triage Notes (Addendum)
Pt from [facility] via ems; c/o LLQ abd pain, generalized chronic pain; pain in buttock region from possible decubitus ulcer; recent labs abnormal; Possible UTI symptoms, per staff urine dark and malodorous; low PO intake x 2 days; hx opiate dependence, pt on prednisone for generalized muscle weakness; 12 lead showing L BBB  97.40F temporal CBG 77 BP 96/52 HR 80

## 2018-01-05 NOTE — ED Notes (Signed)
Mid report, this RN advised by critical care that pt was not going to [facility] for sure, possible ICU admit; will continue to monitor pt and bed assignment situation

## 2018-01-05 NOTE — Consult Note (Addendum)
[facility] Surgery Consult Note  Jennifer [name] 08/15/1935  [id number].    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 Patient is not a great historian. States that the pain may have been going on for about 1 week but became acutely worse 1-2 nights ago. Some nausea and chills, no emesis or known fevers. Prior to onset of abdominal pain she reports having non-bloody diarrhea. Pain is currently diffuse, constant, and worse with movement or palpation.  Seen by neurologist yesterday who ordered lab work which revealed worsening kidney function. In the ED WBC 9.5, creatinine 4.55, hemoglobin 10.2. Temp 98, BP 91/48, HR 67. Abdominal xray revealed moderate free air. CT scan pending. General surgery asked to see.  -Abdominal surgical history: tubal ligation, cholecystectomy, R nephrectomy -Upper endoscopy 2014 showed tiny hiatal hernia, otherwise normal -Anticoagulants: none -[place] bound -Lives at [facility]  Constitutional: Positive for chills and malaise/fatigue. Negative for fever.  HENT: Negative.   Eyes: Negative.   Respiratory: Negative.   Cardiovascular: Positive for leg swelling.  Gastrointestinal: Positive for abdominal pain, diarrhea and nausea. Negative for blood in stool, melena and vomiting.  Genitourinary: Negative.   Musculoskeletal: Positive for joint pain.  Skin: Negative.   Neurological: Positive for weakness.  Psychiatric/Behavioral: Positive for depression and memory loss. The patient is nervous/anxious.    All systems reviewed and otherwise negative except for as above  Family History  Problem Relation Age  of Onset  . Stomach cancer Mother   . Diabetes Brother   . Heart attack Brother     Past Medical History:  Diagnosis Date  . Anemia   . Anxiety   . Arthritis   . Atypical chest pain    a. 2006 Cath: nl cors;  b. 05/2010 MV: no ischemia;  c. 05/2013 MV: nl EF, no ischemia. d. 06/2016: NST showing no ischemia or prior infarction  . Blood transfusion   . Cardiomyopathy, nonischemic ([facility]    a. 2006 EF initially 30%;  b. 10/2009 Echo EF now 50-55%; c. 11/2012 Echo: EF 45-50% w/ septal motion abnormality (LBBB), gr1 DD, mod TR, PASP 459mg; d. 05/2013 MV: Nl EF by SPECT.  . Constipation due to pain medication   . Dementia ([facility]  . Depression   . Diarrhea   . Frequency of urination    at night  . GERD (gastroesophageal reflux disease)   . Glaucoma   . Hypertension   . LBBB (left bundle branch block)   . My[place]henia gravis ([facility]  . Osteoporosis 04/13/2006  . Paget's disease of bone 11/29/2012  . Psychosis ([facility]  . Renal disorder    only has one kidney; Right kidney stopped working after last child was born  . Seizures ([facility]   approximately 20 years since last seizure  . Swelling of both ankles    Takes Lasix if needed  . Thyroid disease 04/13/2006   hypothyroidism  . TIA (transient ischemic attack)     Past Surgical History:  Procedure Laterality Date  . BREAST LUMPECTOMY Left    x 2  . CARDIAC CATHETERIZATION    . CHOLECYSTECTOMY    . ESOPHAGOGASTRODUODENOSCOPY N/A 11/30/2012   Procedure: ESOPHAGOGASTRODUODENOSCOPY (  EGD);  Surgeon: Wonda Horner, MD;  Location: [facility] ENDOSCOPY;  Service: Endoscopy;  Laterality: N/A;  . EYE SURGERY     cataract removal pt unsure which eye  . FOOT SURGERY Left   . JOINT REPLACEMENT Bilateral    knees  . NEPHRECTOMY  1963  . PATELLAR TENDON REPAIR Right 07/12/2013   Procedure: PRIMARY LEFT PATELLA TENDON REPAIR;  Surgeon: Kerin Salen, MD;  Location: [place];  Service: Orthopedics;  Laterality: Right;  . SHOULDER SURGERY Right   .  TONSILLECTOMY    . TOTAL KNEE REVISION Right 06/19/2013   Procedure: RIGHT TOTAL KNEE REVISION;  Surgeon: Kerin Salen, MD;  Location: [facility];  Service: Orthopedics;  Laterality: Right;  . TUBAL LIGATION      Social History:  reports that she has never smoked. Her smokeless tobacco use includes snuff. She reports that she does not drink alcohol or use drugs.  Allergies:  Allergies  Allergen Reactions  . Codeine Nausea Only     (Not in a hospital admission)  Prior to Admission medications   Medication Sig Start Date End Date Taking? Authorizing Provider  acetaminophen (TYLENOL) 500 MG tablet Take 500 mg by mouth every 4 (four) hours as needed for mild pain or headache (or fever of 99.5-101 F).     [provider]  alum & mag hydroxide-simeth ([place]) 200-200-20 MG/5ML suspension Take 30 mLs by mouth every 6 (six) hours as needed for indigestion or heartburn. AND CANNOT EXCEED 4 DOSES IN 24 HOURS    [provider]  amLODipine (NORVASC) 5 MG tablet Take 1 tablet (5 mg total) by mouth daily. 06/25/17   Shelly Coss, MD  ARIPiprazole (ABILIFY) 2 MG tablet Take 2 mg by mouth daily.    [provider]  aspirin 81 MG chewable tablet Chew 81 mg by mouth daily.    [provider]  azaTHIOprine (IMURAN) 50 MG tablet Take 2 tablets (100 mg total) by mouth daily. 03/13/17   Kathrynn Ducking, MD  buPROPion (WELLBUTRIN SR) 150 MG 12 hr tablet Take 150 mg by mouth daily.    [provider]  Carboxymethylcellulose Sodium (REFRESH LIQUIGEL OP) Place 1 drop into both eyes 2 (two) times daily as needed (for dryness).    [provider]  carvedilol (COREG) 12.5 MG tablet Take 12.5 mg by mouth 2 (two) times daily with a meal. HOLD FOR SYSTOLIC B/P <840 or HR <37    [provider]  Cholecalciferol (VITAMIN D3) 2000 units TABS Take 2,000 Units by mouth daily.    [provider]  cloNIDine (CATAPRES) 0.1 MG tablet Take 1 tablet (0.1 mg  total) by mouth 3 (three) times daily. 06/24/17   Shelly Coss, MD  cycloSPORINE (RESTASIS) 0.05 % ophthalmic emulsion Place 1 drop into both eyes 2 (two) times daily.    [provider]  diclofenac sodium (VOLTAREN) 1 % GEL Apply 2 g topically 4 (four) times daily. Rub into affected area of foot 2 to 4 times daily Patient taking differently: Apply 2 g topically See admin instructions. Apply 2 grams topically to both feet two times a day for pain 03/10/16   Trula Slade, DPM  dorzolamidel-timolol (COSOPT PF) 22.3-6.8 MG/ML SOLN ophthalmic solution Place 1 drop into both eyes 2 (two) times daily.    [provider]  fentaNYL (DURAGESIC - DOSED MCG/HR) 25 MCG/HR patch Place 1 patch (25 mcg total) onto the skin every 3 (three) days. REMOVE OLD [facility] FIRST  06/24/17   Shelly Coss, MD  ferrous sulfate 325 (65 FE) MG tablet Take 325 mg by mouth every Monday, Wednesday, and Friday.     [provider]  furosemide (LASIX) 20 MG tablet Take 1 tablet (20 mg total) by mouth daily. Patient taking differently: Take 20 mg by mouth daily. HOLD FOR SYSTOLIC B/P <741 2/87/86 06/14/17  Strader, Fransisco Hertz, PA-C  isosorbide dinitrate (ISORDIL) 30 MG tablet Take 30 mg by mouth 4 (four) times daily. HOLD FOR SYSTOLIC B/P <767    [provider]  LORazepam (ATIVAN) 0.5 MG tablet Take 0.5-1 mg by mouth See admin instructions. Take 0.5 mg by mouth in the morning and 1 mg at bedtime    [provider]  losartan (COZAAR) 25 MG tablet Take 1 tablet by mouth 2 (two) times daily. HOLD FOR SYSTOLIC B/P <209 4/70/96   [provider]  Melatonin 3 MG TABS Take 3 mg by mouth at bedtime.    [provider]  Menthol, Topical Analgesic, (BIOFREEZE) 4 % GEL Apply 1 Dose topically every 4 (four) hours as needed (Apply to right side as needed).    [provider]  Multiple Vitamins-Minerals (CERTAVITE/ANTIOXIDANTS) TABS Take 1 tablet by mouth daily.    [provider]  NONFORMULARY OR COMPOUNDED ITEM [facility]:  "Authorized Substitute" Pain Cream Formulation - Ibuprofen 15%, Gaclofen 1%, Gabapentin 3%, Lidocaine 2%, apply 2 grams to affected area 3 times daily. 03/16/16   Trula Slade, DPM  pantoprazole (PROTONIX) 20 MG tablet Take 20 mg by mouth daily.    [provider]  polyethylene glycol (MIRALAX / GLYCOLAX) packet Take 17 g by mouth daily as needed (FOR CONSTIPATION). MIXED INTO 4-8 OUNCES OF LIQUID    [provider]  predniSONE (DELTASONE) 5 MG tablet Take 4 tablets (20 mg total) by mouth daily with breakfast. 01/04/18   Kathrynn Ducking, MD  pyridostigmine (MESTINON) 60 MG tablet Take 0.5 tablets (30 mg total) by mouth 3 (three) times daily. 03/13/17   Kathrynn Ducking, MD  saccharomyces boulardii (FLORASTOR) 250 MG capsule Take 250 mg by mouth daily.    [provider]  traZODone (DESYREL) 50 MG tablet Take 25 mg by mouth at bedtime.    [provider]  vitamin B-12 (CYANOCOBALAMIN) 100 MCG tablet Take 50 mcg by mouth daily.     [provider]    Blood pressure (!) 91/48, pulse 67, temperature 98 F (36.7 C), temperature source Oral, resp. rate 13, height 5' 4"  (1.626 m), weight 95.3 kg, SpO2 95 %. Physical Exam: General: anxious appearing, WD/WN AA female who is laying in bed in NAD HEENT: head is normocephalic, atraumatic.  Sclera are noninjected.  Pupils equal and round.  Ears and nose without any masses or lesions.  Mouth is pink and moist. Edentate Heart: regular, rate, and rhythm.  No obvious murmurs, gallops, or rubs noted.  Palpable pedal pulses bilaterally Lungs: CTAB, no wheezes, rhonchi, or rales noted.  Respiratory effort nonlabored Abd: obese, soft, non-rigid, mild distension, few BS heard, no masses, hernias, or organomegaly. Moderate diffuse tenderness with some guarding more so in the right than left quadrants MS: 1+ edema BLE Skin: warm and dry with no masses,  lesions, or rashes Psych: tearful  Results for orders placed or performed during the hospital encounter of 01/04/2018 (from the past 48 hour(s))  Lipase, blood     Status: None   Collection Time: 01/09/2018  8:58 AM  Result Value  Ref Range   Lipase 20 11 - 51 U/L    Comment: Performed at [facility] [address] [place], [place] [address]  Comprehensive metabolic panel     Status: Abnormal   Collection Time: 12/21/2017  8:58 AM  Result Value Ref Range   Sodium 139 135 - 145 mmol/L   Potassium 3.7 3.5 - 5.1 mmol/L   Chloride 106 98 - 111 mmol/L   CO2 22 22 - 32 mmol/L   Glucose, Bld 60 (L) 70 - 99 mg/dL   BUN 66 (H) 8 - 23 mg/dL   Creatinine, Ser 4.55 (H) 0.44 - 1.00 mg/dL   Calcium 8.7 (L) 8.9 - 10.3 mg/dL   Total Protein 5.2 (L) 6.5 - 8.1 g/dL   Albumin 2.1 (L) 3.5 - 5.0 g/dL   AST 21 15 - 41 U/L   ALT 19 0 - 44 U/L   Alkaline Phosphatase 88 38 - 126 U/L   Total Bilirubin 1.1 0.3 - 1.2 mg/dL   GFR calc non Af Amer 8 (L) >60 mL/min   GFR calc Af Amer 10 (L) >60 mL/min    Comment: (NOTE) The eGFR has been calculated using the CKD EPI equation. This calculation has not been validated in all clinical situations. eGFR's persistently <60 mL/min signify possible Chronic Kidney Disease.    Anion gap 11 5 - 15    Comment: Performed at [facility] [address], [place], [place] [address]  CBC     Status: Abnormal   Collection Time: 12/25/2017  8:58 AM  Result Value Ref Range   WBC 9.5 4.0 - 10.5 K/uL   RBC 3.08 (L) 3.87 - 5.11 MIL/uL   Hemoglobin 10.2 (L) 12.0 - 15.0 g/dL   HCT 32.2 (L) 36.0 - 46.0 %   MCV 104.5 (H) 80.0 - 100.0 fL   MCH 33.1 26.0 - 34.0 pg   MCHC 31.7 30.0 - 36.0 g/dL   RDW 17.1 (H) 11.5 - 15.5 %   Platelets 192 150 - 400 K/uL   nRBC 0.4 (H) 0.0 - 0.2 %    Comment: Performed at [facility] [address] [place], [place] [address]   Dg Abdomen Acute W/chest  Result Date: 12/30/2017 CLINICAL DATA:  Sharp left abdominal pain EXAM: DG  ABDOMEN ACUTE W/ 1V CHEST COMPARISON:  CT 06/22/2017 and previous FINDINGS: Heart size upper limits normal.  Ectatic thoracic aorta. Patchy airspace opacity in the left lower lung. No pleural effusion. No pneumothorax. Moderate amount of free intraperitoneal gas. Stomach, small bowel, and colon are nondilated. Cholecystectomy clips.  Calcified uterine fibroid. Spondylitic changes in the lower thoracic and lumbar spine. DJD in bilateral hips. IMPRESSION: 1. Moderate free intraperitoneal gas consistent with bowel perforation. Critical Value/emergent results were called by telephone at the time of interpretation on 01/04/2018 at 9:53 am to Dr. Ronnald Nian, who verbally acknowledged these results. 2. Focal airspace opacity in the left lower lung, possibly pneumonia. Recommend attention on follow-up to confirm appropriate resolution. Electronically Signed   By: Lucrezia Europe M.D.   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 Patient is acutely ill with multiple medical problems. Would recommend exploratory laparotomy, but due to age and chronic health issues the patient is high risk for any surgical intervention. I spoke with her daughter on the phone who is currently unsure how aggressive she wishes to be with her mother's health care. She is on her way to the hospital, and we will discuss further once she gets here. Agree with IV zosyn, bowel rest, and IVF resuscitation for now. Will ask medicine to admit.   Addendum: Discussion held with Dr. Dalbert Batman, Dr. Lindell Noe, patient and her daughter,  Venida Jarvis, regarding the risks and benefits of surgery. Discussed that she is high risk with surgery, but she will also die without an operation. Due to her age, co-morbidities including myasthenia gravis on daily prednisone and cardiomyopathy with EF 45-50%, she is high risk for undergoing general anesthetic, for being unable to come off the ventilatory postoperatively, and for healing well postoperatively. Sherri says that the patient's sons are on their way to the hospital, and she would like to discuss the options with them when they get here. We will plan to discuss again with the patient once more family arrives.   Wellington Hampshire, [facility] Surgery 12/28/2017, 11:03 AM Pager: [phone] Mon 7:00 am -11:30 AM Tues-Fri 7:00 am-4:30 pm Sat-Sun 7:00 am-11:30 am

## 2018-01-05 NOTE — ED Provider Notes (Signed)
[place] EMERGENCY DEPARTMENT Provider Note   CSN: [id number] Arrival date & time: 12/26/2017  1914     History   Chief Complaint Chief Complaint  Patient presents with  . Abdominal Pain  . Hypotension    HPI Jennifer [name] is a 82 y.o. female with history of myasthenia gravis, dementia, depression, GERD, seizures, HLD, HTN, Paget's disease, CKD presents sent from [facility] rehab facility for evaluation of acute onset, intermittent left-sided abdominal pain for 1 week.  She notes that pain is sharp and severe, worsens with movement.  She notes nausea but no vomiting.  She notes intermittent dysuria but denies any other urinary symptoms.  She states that prior to development of pain she did have multiple episodes of watery nonbloody diarrhea.  Denies fevers, chest pain, shortness of breath, melena, hematochezia, or hematemesis.  She also endorses generalized weakness.  She was seen by her neurologist yesterday who ordered some lab work and called today to let her caretakers know that she has worsening kidney function.  He also increased her prednisone from 15 mg daily to 20 mg daily.  She is on multiple sedating medications including lorazepam, trazodone, melatonin, hydrocodone, fentanyl, and gabapentin.   The history is provided by the patient.    Past Medical History:  Diagnosis Date  . Anemia   . Anxiety   . Arthritis   . Atypical chest pain    a. 2006 Cath: nl cors;  b. 05/2010 MV: no ischemia;  c. 05/2013 MV: nl EF, no ischemia. d. 06/2016: NST showing no ischemia or prior infarction  . Blood transfusion   . Cardiomyopathy, nonischemic ([place])    a. 2006 EF initially 30%;  b. 10/2009 Echo EF now 50-55%; c. 11/2012 Echo: EF 45-50% w/ septal motion abnormality (LBBB), gr1 DD, mod TR, PASP 48mmHg; d. 05/2013 MV: Nl EF by SPECT.  . Constipation due to pain medication   . Dementia ([place])   . Depression   . Diarrhea   . Frequency of urination    at night  . GERD  (gastroesophageal reflux disease)   . Glaucoma   . Hypertension   . LBBB (left bundle branch block)   . Myasthenia gravis ([place]) 12/09/2016  . Osteoporosis 04/13/2006  . Paget's disease of bone 11/29/2012  . Psychosis ([place])   . Renal disorder    only has one kidney; Right kidney stopped working after last child was born  . Seizures ([place])    approximately 20 years since last seizure  . Swelling of both ankles    Takes Lasix if needed  . Thyroid disease 04/13/2006   hypothyroidism  . TIA (transient ischemic attack)     Patient Active Problem List   Diagnosis Date Noted  . Bowel perforation ([place]) 12/15/2017  . Pressure injury of skin 06/16/2017  . Acute encephalopathy 06/15/2017  . Acute metabolic encephalopathy 78/29/5621  . Hyperlipidemia 06/15/2017  . Myasthenia gravis ([place]) 12/09/2016  . Debilitated patient 06/18/2016  . Chest pain at rest 06/16/2016  . LBBB (left bundle branch block)   . Lower extremity edema 01/25/2014  . Patellar tendon rupture 07/12/2013  . Constipation 07/02/2013  . Knee pain 06/28/2013  . Acute posthemorrhagic anemia 06/28/2013  . Senile dementia, uncomplicated ([place]) 30/86/5784  . Right Mechanical complication of knee prosthesis 06/19/2013  . Failure of total knee arthroplasty ([place]) 06/19/2013  . Paget's disease of bone 11/29/2012  . Nausea alone 11/29/2012  . GERD (gastroesophageal reflux disease) 11/29/2012  . Nonischemic  cardiomyopathy ([place]) 11/29/2012  . Anemia 11/29/2012  . Depression 11/29/2012  . Abdominal pain, epigastric 11/28/2012  . UTERINE FIBROID 04/13/2006  . Hypothyroidism 04/13/2006  . HYPOPARATHYROIDISM 04/13/2006  . OBESITY, NOS 04/13/2006  . Pernicious anemia 04/13/2006  . ANXIETY 04/13/2006  . DEPRESSIVE DISORDER, NOS 04/13/2006  . GLAUCOMA 04/13/2006  . Essential hypertension 04/13/2006  . GASTROESOPHAGEAL REFLUX, NO ESOPHAGITIS 04/13/2006  . CKD (chronic kidney disease) stage 3, GFR 30-59 ml/min (HCC) 04/13/2006  .  OSTEOARTHRITIS, LOWER LEG 04/13/2006  . ROTATOR CUFF TENDONITIS 04/13/2006  . OSTEOPOROSIS, UNSPECIFIED 04/13/2006  . COSTOCHONDRITIS 04/13/2006  . INSOMNIA NOS 04/13/2006  . PAIN, GENERALIZED 04/13/2006  . INCONTINENCE, URGE 04/13/2006    Past Surgical History:  Procedure Laterality Date  . BREAST LUMPECTOMY Left    x 2  . CARDIAC CATHETERIZATION    . CHOLECYSTECTOMY    . ESOPHAGOGASTRODUODENOSCOPY N/A 11/30/2012   Procedure: ESOPHAGOGASTRODUODENOSCOPY (EGD);  Surgeon: Wonda Horner, MD;  Location: [facility] ENDOSCOPY;  Service: Endoscopy;  Laterality: N/A;  . EYE SURGERY     cataract removal pt unsure which eye  . FOOT SURGERY Left   . JOINT REPLACEMENT Bilateral    knees  . NEPHRECTOMY  1963  . PATELLAR TENDON REPAIR Right 07/12/2013   Procedure: PRIMARY LEFT PATELLA TENDON REPAIR;  Surgeon: Kerin Salen, MD;  Location: [place];  Service: Orthopedics;  Laterality: Right;  . SHOULDER SURGERY Right   . TONSILLECTOMY    . TOTAL KNEE REVISION Right 06/19/2013   Procedure: RIGHT TOTAL KNEE REVISION;  Surgeon: Kerin Salen, MD;  Location: [place];  Service: Orthopedics;  Laterality: Right;  . TUBAL LIGATION       OB History   None      Home Medications    Prior to Admission medications   Medication Sig Start Date End Date Taking? Authorizing Provider  alum & mag hydroxide-simeth ([place]) 200-200-20 MG/5ML suspension Take 30 mLs by mouth every 6 (six) hours as needed for indigestion or heartburn. AND CANNOT EXCEED 4 DOSES IN 24 HOURS   Yes [provider]  ARIPiprazole (ABILIFY) 2 MG tablet Take 2 mg by mouth daily.   Yes [provider]  azaTHIOprine (IMURAN) 50 MG tablet Take 2 tablets (100 mg total) by mouth daily. 03/13/17  Yes Kathrynn Ducking, MD  buPROPion (WELLBUTRIN XL) 300 MG 24 hr tablet Take 300 mg by mouth daily.   Yes [provider]  Carboxymethylcellulose Sodium (REFRESH LIQUIGEL OP) Place 1 drop into both eyes 2 (two) times daily as needed  (for dryness).   Yes [provider]  Cholecalciferol (VITAMIN D3) 2000 units TABS Take 2,000 Units by mouth daily.   Yes [provider]  collagenase (SANTYL) ointment Apply 1 application topically daily. 30 grams Nurse treatment UNSTAGEABLE PU/INJURY SACRUM Clean with NS, apply skin prep periwound, apply Santyl Ointment to wound bed, cover with collagen and bordered silicone dressing daily   Yes [provider]  cycloSPORINE (RESTASIS) 0.05 % ophthalmic emulsion Place 1 drop into both eyes 2 (two) times daily.   Yes [provider]  diclofenac sodium (VOLTAREN) 1 % GEL Apply 2 g topically 4 (four) times daily. Rub into affected area of foot 2 to 4 times daily Patient taking differently: Apply 2 g topically See admin instructions. Apply 2 grams topically to both feet two times a day for pain 03/10/16  Yes Trula Slade, DPM  dorzolamidel-timolol (COSOPT PF) 22.3-6.8 MG/ML SOLN ophthalmic solution Place 1 drop into both  eyes 2 (two) times daily.   Yes [provider]  fentaNYL (DURAGESIC - DOSED MCG/HR) 25 MCG/HR patch Place 1 patch (25 mcg total) onto the skin every 3 (three) days. [place] FIRST Patient taking differently: Place 37.5 mcg onto the skin every 3 (three) days. REMOVE OLD PATCH FIRST 06/24/17  Yes Shelly Coss, MD  ferrous sulfate 325 (65 FE) MG tablet Take 325 mg by mouth daily.    Yes [provider]  HYDROcodone-acetaminophen (NORCO/VICODIN) 5-325 MG tablet Take 1 tablet by mouth 2 (two) times daily as needed for moderate pain.   Yes [provider]  isosorbide dinitrate (ISORDIL) 30 MG tablet Take 30 mg by mouth 4 (four) times daily. HOLD FOR SYSTOLIC B/P <009   Yes [provider]  Lidocaine (ASPERCREME LIDOCAINE) 4 % PTCH Apply 1 application topically daily.   Yes [provider]  Melatonin 3 MG TABS Take 6 mg by mouth at bedtime.    Yes [provider]  Menthol, Topical  Analgesic, (BIOFREEZE) 4 % GEL Apply 1 Dose topically every 4 (four) hours as needed (Apply to right side as needed).   Yes [provider]  Multiple Vitamins-Minerals (CERTAVITE/ANTIOXIDANTS) TABS Take 1 tablet by mouth daily.   Yes [provider]  ondansetron (ZOFRAN) 4 MG tablet Take 4 mg by mouth every 4 (four) hours as needed for nausea or vomiting.   Yes [provider]  pantoprazole (PROTONIX) 40 MG tablet Take 40 mg by mouth daily.    Yes [provider]  polyethylene glycol (MIRALAX / GLYCOLAX) packet Take 17 g by mouth daily as needed (FOR CONSTIPATION). MIXED INTO 4-8 OUNCES OF LIQUID   Yes [provider]  predniSONE (DELTASONE) 5 MG tablet Take 4 tablets (20 mg total) by mouth daily with breakfast. 01/04/18  Yes Kathrynn Ducking, MD  pyridostigmine (MESTINON) 60 MG tablet Take 0.5 tablets (30 mg total) by mouth 3 (three) times daily. 03/13/17  Yes Kathrynn Ducking, MD  saccharomyces boulardii (FLORASTOR) 250 MG capsule Take 250 mg by mouth daily.   Yes [provider]  traZODone (DESYREL) 50 MG tablet Take 75 mg by mouth at bedtime.    Yes [provider]  vitamin B-12 (CYANOCOBALAMIN) 100 MCG tablet Take 50 mcg by mouth daily.    Yes [provider]  amLODipine (NORVASC) 5 MG tablet Take 1 tablet (5 mg total) by mouth daily. Patient not taking: Reported on 12/25/2017 06/25/17   Shelly Coss, MD  cloNIDine (CATAPRES) 0.1 MG tablet Take 1 tablet (0.1 mg total) by mouth 3 (three) times daily. Patient not taking: Reported on 01/06/2018 06/24/17   Shelly Coss, MD  furosemide (LASIX) 20 MG tablet Take 1 tablet (20 mg total) by mouth daily. Patient taking differently: Take 20 mg by mouth daily. HOLD FOR SYSTOLIC B/P <381 10/13/91 06/14/17  Strader, Fransisco Hertz, PA-C  NONFORMULARY OR COMPOUNDED ITEM [facility]:  "Authorized Substitute" Pain Cream Formulation - Ibuprofen 15%, Gaclofen 1%, Gabapentin 3%, Lidocaine  2%, apply 2 grams to affected area 3 times daily. Patient not taking: Reported on 01/02/2018 03/16/16   Trula Slade, DPM    Family History Family History  Problem Relation Age of Onset  . Stomach cancer Mother   . Diabetes Brother   . Heart attack Brother     Social History Social History   Tobacco Use  . Smoking status: Never Smoker  . Smokeless tobacco: Current User    Types: Snuff  Substance  Use Topics  . Alcohol use: No  . Drug use: No     Allergies   Codeine   Review of Systems Review of Systems  Constitutional: Positive for fatigue. Negative for chills and fever.  Respiratory: Negative for shortness of breath.   Cardiovascular: Negative for chest pain.  Gastrointestinal: Positive for abdominal pain, diarrhea (resolved) and nausea. Negative for constipation and vomiting.  Genitourinary: Positive for dysuria. Negative for hematuria.  All other systems reviewed and are negative.    Physical Exam Updated Vital Signs BP 100/60   Pulse 80   Temp 98 F (36.7 C) (Oral)   Resp 12   Ht 5\' 4"  (1.626 m)   Wt 95.3 kg   SpO2 92%   BMI 36.05 kg/m   Physical Exam  Constitutional: She appears well-developed and well-nourished. No distress.  Obese female, resting in bed  HENT:  Head: Normocephalic and atraumatic.  Eyes: Conjunctivae are normal. Right eye exhibits no discharge. Left eye exhibits no discharge.  Neck: No JVD present. No tracheal deviation present.  Cardiovascular: Normal rate and regular rhythm.  Pulmonary/Chest: Effort normal.  Examination limited due to body habitus  Abdominal: Soft. She exhibits no distension. Bowel sounds are decreased. There is tenderness in the epigastric area, periumbilical area, suprapubic area, left upper quadrant and left lower quadrant. There is no rigidity, no guarding, no tenderness at McBurney's point and negative Murphy's sign.  Musculoskeletal: She exhibits no edema.  Neurological: She is alert.  Skin: Skin is  warm and dry. No erythema.  Psychiatric: She has a normal mood and affect. Her behavior is normal.  Nursing note and vitals reviewed.    ED Treatments / Results  Labs (all labs ordered are listed, but only abnormal results are displayed) Labs Reviewed  COMPREHENSIVE METABOLIC PANEL - Abnormal; Notable for the following components:      Result Value   Glucose, Bld 60 (*)    BUN 66 (*)    Creatinine, Ser 4.55 (*)    Calcium 8.7 (*)    Total Protein 5.2 (*)    Albumin 2.1 (*)    GFR calc non Af Amer 8 (*)    GFR calc Af Amer 10 (*)    All other components within normal limits  CBC - Abnormal; Notable for the following components:   RBC 3.08 (*)    Hemoglobin 10.2 (*)    HCT 32.2 (*)    MCV 104.5 (*)    RDW 17.1 (*)    nRBC 0.4 (*)    All other components within normal limits  URINALYSIS, ROUTINE W REFLEX MICROSCOPIC - Abnormal; Notable for the following components:   Color, Urine AMBER (*)    APPearance HAZY (*)    Leukocytes, UA TRACE (*)    Bacteria, UA FEW (*)    All other components within normal limits  CBG MONITORING, ED - Abnormal; Notable for the following components:   Glucose-Capillary 53 (*)    All other components within normal limits  CBG MONITORING, ED - Abnormal; Notable for the following components:   Glucose-Capillary 139 (*)    All other components within normal limits  LIPASE, BLOOD    EKG None  Radiology Ct Abdomen Pelvis Wo Contrast  Result Date: 01/06/2018 CLINICAL DATA:  Intraperitoneal free air on radiograph. Concern for bowel perforation. Abdominal pain EXAM: CT ABDOMEN AND PELVIS WITHOUT CONTRAST TECHNIQUE: Multidetector CT imaging of the abdomen and pelvis was performed following the standard protocol without IV contrast. COMPARISON:  Radiograph  12/25/2017, CT 599 FINDINGS: Lower chest: Small bilateral pleural effusions. No infiltrate. Mild basilar atelectasis. Hepatobiliary: No focal abnormality on noncontrast exam. Postcholecystectomy.  Pancreas: Pancreas is normal. No ductal dilatation. No pancreatic inflammation. Spleen: Normal spleen Adrenals/urinary tract: Adrenal glands are normal. Post RIGHT nephrectomy. No hydronephrosis of the LEFT kidney. LEFT ureter and bladder normal. Stomach/Bowel: Large volume intraperitoneal free air collecting non dependently within the peritoneal space and extending beneath the hemidiaphragms and extending along the LEFT pericolic gutter and splenic flexure of the colon. There is no clear perforation site identified however the predominance of gas surrounding distal transverse colon to the splenic flexure is concerning for site of perforation in the transverse or LEFT colon. Gas around the LEFT pararenal space also suggesting a LEFT colon rupture. Oral contrast was administered. There is no evidence of leak of the oral contrast. No fluid collections in the abdomen pelvis. Esophagus and stomach appear normal. Duodenum normal. Small bowel normal caliber. Contrast flows in the distal small bowel. Appendix and cecum normal. Ascending colon normal. Transverse colon is difficult to follow as it is surrounded extraluminal gas colon is relatively collapsed. There are diverticula of the descending colon. No acute inflammation identified. Diverticula sigmoid colon rectum. Vascular/Lymphatic: Abdominal aorta is normal caliber with atherosclerotic calcification. There is no retroperitoneal or periportal lymphadenopathy. No pelvic lymphadenopathy. Reproductive: Uterus normal. Calcified leiomyoma extending from uterine body. Ovaries normal Other: Large volume intraperitoneal free air described above. Musculoskeletal: A thickened trabecula and lytic process in the sacrum is not changed from prior. Findings suggest chronic infection or inflammation such as Paget's disease. IMPRESSION: 1. Large volume intraperitoneal free air consistent with perforated bowel. Favor transverse colon or LEFT colon as source of perforation as the gas  is concentrated along this portion the colon however there is extensive gas throughout the abdomen which makes localization difficult. 2. Diverticulosis of the LEFT colon without clear evidence diverticulitis. 3. No spillage of oral contrast proximally to suggests duodenal rupture. No intraperitoneal free fluid. 4. Normal  solitary LEFT kidney on noncontrast exam. Critical Value/emergent results were called by telephone at the time of interpretation on 01/01/2018 at 12:00 pm to Dr. Rodell Perna , who verbally acknowledged these results. Electronically Signed   By: Suzy Bouchard M.D.   On: 01/08/2018 12:03   Dg Abdomen Acute W/chest  Result Date: 12/17/2017 CLINICAL DATA:  Sharp left abdominal pain EXAM: DG ABDOMEN ACUTE W/ 1V CHEST COMPARISON:  CT 06/22/2017 and previous FINDINGS: Heart size upper limits normal.  Ectatic thoracic aorta. Patchy airspace opacity in the left lower lung. No pleural effusion. No pneumothorax. Moderate amount of free intraperitoneal gas. Stomach, small bowel, and colon are nondilated. Cholecystectomy clips.  Calcified uterine fibroid. Spondylitic changes in the lower thoracic and lumbar spine. DJD in bilateral hips. IMPRESSION: 1. Moderate free intraperitoneal gas consistent with bowel perforation. Critical Value/emergent results were called by telephone at the time of interpretation on 01/10/2018 at 9:53 am to Dr. Ronnald Nian, who verbally acknowledged these results. 2. Focal airspace opacity in the left lower lung, possibly pneumonia. Recommend attention on follow-up to confirm appropriate resolution. Electronically Signed   By: Lucrezia Europe M.D.   On: 01/13/2018 09:54    Procedures .Critical Care Performed by: Renita Papa, PA-C Authorized by: Renita Papa, PA-C   Critical care provider statement:    Critical care time (minutes):  45   Critical care was necessary to treat or prevent imminent or life-threatening deterioration of the following conditions:  Dehydration (bowel  perforation)  Critical care was time spent personally by me on the following activities:  Discussions with consultants, evaluation of patient's response to treatment, examination of patient, ordering and performing treatments and interventions, ordering and review of laboratory studies, ordering and review of radiographic studies, pulse oximetry, re-evaluation of patient's condition, obtaining history from patient or surrogate and review of old charts   (including critical care time)  Medications Ordered in ED Medications  piperacillin-tazobactam (ZOSYN) IVPB 2.25 g (has no administration in time range)  lactated ringers infusion ( Intravenous New Bag/Given 12/30/2017 1341)  dextrose 50 % solution 50 mL (50 mLs Intravenous Not Given 01/06/2018 1400)  sodium chloride 0.9 % bolus 500 mL (0 mLs Intravenous Stopped 12/28/2017 1052)  ondansetron (ZOFRAN) injection 4 mg (4 mg Intravenous Given 12/24/2017 0913)  piperacillin-tazobactam (ZOSYN) IVPB 3.375 g (0 g Intravenous Stopped 12/15/2017 1206)  sodium chloride 0.9 % bolus 500 mL (0 mLs Intravenous Stopped 12/18/2017 1207)  sodium chloride 0.9 % bolus 1,000 mL (0 mLs Intravenous Stopped 01/07/2018 1256)  sodium chloride 0.9 % bolus 500 mL (0 mLs Intravenous Stopped 12/30/2017 1338)  dextrose 50 % solution (50 mLs  Given 12/16/2017 1352)     Initial Impression / Assessment and Plan / ED Course  I have reviewed the triage vital signs and the nursing notes.  Pertinent labs & imaging results that were available during my care of the patient were reviewed by me and considered in my medical decision making (see chart for details).     Patient presents for evaluation of abdominal pain.  She is afebrile, persistently hypotensive in the ED.  She is a poor historian, complicated by dementia.  Lab work significant for anemia with hemoglobin of 10.2, renal insufficiency with creatinine of 4.55 and BUN elevated at 66 consistent with AKI.  Her baseline appears to be around  1.5.  UA does not suggest UTI or nephrolithiasis.  Unfortunately, her x-rays show evidence of free intraperitoneal gas consistent with bowel perforation. 10:17 AM Spoke with Jerene Pitch with general surgery informed of free air.  Plan to obtain noncontrast CT of the abdomen and pelvis for further work-up.  Will give IV Zosyn for coverage of presumed intra-abdominal infection. 11:19 AM Spoke with patient's daughter Venida Jarvis on the phone with patient's permission. She states "it's not written, but I am her POA". Updated on patient's workup and findings. When asked about code status, she states "no, I don't want her hooked up to anything if she can't do it herself".  12:00PM CT scan with large volume intraperitoneal free air consistent with perforated bowel, likely from transverse colon or left colon as source.  Spoke with family medicine teaching service who agreed to assume care patient bring her into the hospital for further evaluation and management.  General surgery with plan for likely ex lap.  Modest improvement in blood pressure with IV fluid hydration.  Patient seen and evaluated by Dr. Ronnald Nian who agrees with assessment and plan at this time.   Final Clinical Impressions(s) / ED Diagnoses   Final diagnoses:  Perforation bowel ([place])  AKI (acute kidney injury) ([place])  Hypotension, unspecified hypotension type    ED Discharge Orders    None       Renita Papa, PA-C 01/02/2018 1523    Lennice Sites, DO 01/11/2018 1534

## 2018-01-05 NOTE — ED Notes (Signed)
Pt only received 1mg  of morphine IVP per MD due to B/p

## 2018-01-05 NOTE — ED Notes (Signed)
Fentanyl patch noted to L shoulder

## 2018-01-05 NOTE — Progress Notes (Signed)
Pharmacy Antibiotic Note  Jennifer [name] is a 82 y.o. female admitted on 01/13/2018 with intra-abdominal infxn.  Pharmacy has been consulted for zosyn dosing. Pt is afebrile and WBC is WNL. Scr is elevated.   Plan: Zosyn 3.375gm IV x 1 then 2.25gm IV Q6H F/u renal fxn, C&S, clinical status   Height: 5\' 4"  (162.6 cm) Weight: 210 lb (95.3 kg) IBW/kg (Calculated) : 54.7  Temp (24hrs), Avg:98 F (36.7 C), Min:98 F (36.7 C), Max:98 F (36.7 C)  Recent Labs  Lab 01/04/18 1016 12/26/2017 0858  WBC 8.0 9.5  CREATININE 3.74* 4.55*    Estimated Creatinine Clearance: 11 mL/min (A) (by C-G formula based on SCr of 4.55 mg/dL (H)).    Allergies  Allergen Reactions  . Codeine Nausea Only    Antimicrobials this admission: Zosyn 11/22>>  Dose adjustments this admission: N/A  Microbiology results: Pending  Thank you for allowing pharmacy to be a part of this patient's care.  Charyl Minervini, Rande Lawman 12/22/2017 10:03 AM

## 2018-01-05 NOTE — Telephone Encounter (Signed)
I called [employer] [facility] [employer], talk with Gracelyn Nurse, who I assume is her PA or nurse practitioner,  I discussed the blood work, the patient has had a change in her renal status, she is now a stage IV chronic renal insufficiency, her estimated GFR is around 12.  In May 2019, her estimated GFR was 85.  The total protein and albumin level have dropped significantly, the above findings indicate a possible decline in oral intake of food and fluids.  This could potentially have some bearing on her recent generalized weakness.  She continues to have chronic stable anemia.  I will fax the blood work report to [phone].

## 2018-01-05 NOTE — ED Notes (Signed)
Attempted report 

## 2018-01-05 NOTE — Progress Notes (Signed)
FPTS Progress Note:   Called for admission for this patient with complex medical hx presenting for abdominal pain. Complex medical hx includes myasthenia gravis, HFrEF, and chronic steroid use. Surgery consulting for acute abdomen, discussing surgery with patient and family. Given decreasing MAPs, I have called CCM for evaluation. S.p 2.5L bolus so far. Most recent MAP 63. More notes to follow. On my assessment, patient is AOx 3 and requests full code.   Ralene Ok, MD

## 2018-01-05 NOTE — ED Provider Notes (Signed)
Medical screening examination/treatment/procedure(s) were conducted as a shared visit with non-physician practitioner(s) and myself.  I personally evaluated the patient during the encounter. Briefly, the patient is a 82 y.o. female with history of myasthenia gravis, depression, hypertension, cardiomyopathy who presents the ED with abdominal pain.  Patient with unremarkable vitals.  No fever.  Patient with left-sided abdominal pain for the last several days.  States that she has not had any constipation, diarrhea.  Has felt nauseous.  Patient states possibly some pain with urination.  Has felt generally weak.  Patient is tender diffusely on the abdominal exam but mostly over the left side.  She is chronically ill-appearing.  Otherwise exam is unremarkable.  Acute abdomen x-ray showed free air concerning for bowel perforation.  Also show signs of likely pneumonia.  Patient started on IV Zosyn.  Blood work collected.  IV fluids given.  CT of abdomen pelvis ordered to evaluate further.  General surgery consulted and came down to the ED to evaluate the patient.  Awaiting CT scan for further disposition.  Will likely need surgery.  She also with worsening creatinine likely from dehydration.  Patient with CT scan that shows large volume intraperitoneal free air consistent with perforated bowel.  Likely transverse colon or left colon as a source of the perforation.  Patient to be admitted to medicine with surgery following along.  Will likely go to surgery for ex lap.   EKG Interpretation None           Lennice Sites, DO 12/24/2017 1228

## 2018-01-05 NOTE — ED Notes (Signed)
Fentanyl patch removed per Dr. Lindell Noe

## 2018-01-05 NOTE — H&P (Signed)
[facility] Admission History and Physical Service Pager: [phone]  Patient name: Jennifer [name] Medical record number: [id number] Date of birth: 09-May-1935 Age: 82 y.o. Gender: female  Primary Care Provider: Janie Morning, DO Consultants: CCM, surgery Code Status: DNR, comfort care  Chief Complaint: abdominal pain  Assessment and Plan: NAJIA HURLBUTT is a 82 y.o. female presenting with abdominal pain, found to have bowel perforation. PMH is significant for myasthenia gravis with chronic steroid use, AoCKD, hypothyroidism, hypoparathyroidism, glaucoma, anxiety/depression, HTN, HLD, chronic pain.   Bowel perforation, sepsis/shock > comfort care: patient presented with acute abdomen, CT findings c/w bowel perforation. Surgery consulted by ED. On chart review, patient is extremely high surgical risk 2/2 MG with chronic steroid use (informed patient and daughter that MG patients are often hard to wean from the ventilator), HFrEF, chronic anemia, and acute renal failure. Dr. Dalbert Batman discussed high surgical risk of mortality with patient and daughter, they wanted to discuss with additional family. Patient unsure and thus CCM was consulted 2/2 hypotension and AKI suggesting shock, as FMTS and surgical team presumed patient would require ICU stay after surgery. After discussion with CCM and family, family elects for comfort care with the understanding that Ms. Yandell will not survive this illness. I confirmed this decision with daughter again prior to changing orders.  -admit to palliative care, attending Dr. Owens Shark -consult palliative care for med titrations (left VM) -DNR/DNI -RN may pronounce -regular diet as patient is interested -family awaiting their pastor, please call chaplain on call at their request if needed -discontinue labs -vitals daily  -morphine 1mg  q2H PRN, please titrate liberally as needed for pain or air hunger -ativan 1mg  q4H PRN for anxiety, please  titrate liberally as needed for anxiety  -discontinue fluids  Chronic medical problems (MG, acute renal failure, anemia, chronic pain, chronic steroid use) will not be treated as patient is now comfort care as above.   FEN/GI: regular diet Prophylaxis: none  Disposition: comfort care  History of Present Illness:  Jennifer [name] is a 82 y.o. female presenting with abdominal pain.  She lives in an assisted living facility, and her daughter is her healthcare power of attorney.  Patient states she has had abdominal pain for about a week.  She states the pain is generalized across her entire abdomen, and she has had decreased appetite as well as nausea.  No history of similar.  Her daughter says that she has had "stomach issues all her life."  She also has a history of myasthenia gravis with chronic steroid use and chronic pain with a fentanyl patch.  Patient nor daughter recall discussions of reduced EF in the past.   ED imaging suggest perforated bowel.  Discussed the case with both Jerene Pitch PA and Dr. Dalbert Batman.  Also with some then on their discussion with the family regarding prognosis and risk of surgery.  Daughter and patient elected to wait until her sons were able to arrive to make the decision about whether to operate or pursue comfort care.  CCM discussed care with patient as well as her blood pressure was decreasing and she would require ICU stay if operation was pursued.  Family elects for comfort care as opposed to surgery.  I discussed this again with the daughter at the bedside before changing the patient to DNR and comfort care.  I let her know that we will give her mother medications as necessary to keep her comfortable.  Daughter requests a mouth swab.  Instructed  her that if the patient feels like eating things she may.  Review Of Systems: Per HPI with the following additions:   Review of Systems  Constitutional: Negative for fever.  Gastrointestinal: Positive for abdominal pain and  nausea.  Genitourinary: Positive for frequency.    Patient Active Problem List   Diagnosis Date Noted  . Bowel perforation ([place]) 01/12/2018  . Pressure injury of skin 06/16/2017  . Acute encephalopathy 06/15/2017  . Acute metabolic encephalopathy 22/97/9892  . Hyperlipidemia 06/15/2017  . Myasthenia gravis ([place]) 12/09/2016  . Debilitated patient 06/18/2016  . Chest pain at rest 06/16/2016  . LBBB (left bundle branch block)   . Lower extremity edema 01/25/2014  . Patellar tendon rupture 07/12/2013  . Constipation 07/02/2013  . Knee pain 06/28/2013  . Acute posthemorrhagic anemia 06/28/2013  . Senile dementia, uncomplicated ([place]) 11/94/1740  . Right Mechanical complication of knee prosthesis 06/19/2013  . 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 Paget's disease of bone 11/29/2012  . Nausea alone 11/29/2012  . GERD (gastroesophageal reflux disease) 11/29/2012  . Nonischemic cardiomyopathy ([place]) 11/29/2012  . Anemia 11/29/2012  . Depression 11/29/2012  . Abdominal pain, epigastric 11/28/2012  . UTERINE FIBROID 04/13/2006  . Hypothyroidism 04/13/2006  . HYPOPARATHYROIDISM 04/13/2006  . OBESITY, NOS 04/13/2006  . Pernicious anemia 04/13/2006  . ANXIETY 04/13/2006  . DEPRESSIVE DISORDER, NOS 04/13/2006  . GLAUCOMA 04/13/2006  . Essential hypertension 04/13/2006  . GASTROESOPHAGEAL REFLUX, NO ESOPHAGITIS 04/13/2006  . CKD (chronic kidney disease) stage 3, GFR 30-59 ml/min (HCC) 04/13/2006  . OSTEOARTHRITIS, LOWER LEG 04/13/2006  . ROTATOR CUFF TENDONITIS 04/13/2006  . OSTEOPOROSIS, UNSPECIFIED 04/13/2006  . COSTOCHONDRITIS 04/13/2006  . INSOMNIA NOS 04/13/2006  . PAIN, GENERALIZED 04/13/2006  . INCONTINENCE, URGE 04/13/2006    Past Medical History: Past Medical History:  Diagnosis Date  . Anemia   . Anxiety   . Arthritis   . Atypical chest pain    a. 2006 Cath: nl cors;  b. 05/2010 MV: no ischemia;  c. 05/2013 MV: nl EF, no ischemia. d. 06/2016: NST  showing no ischemia or prior infarction  . Blood transfusion   . Cardiomyopathy, nonischemic ([place])    a. 2006 EF initially 30%;  b. 10/2009 Echo EF now 50-55%; c. 11/2012 Echo: EF 45-50% w/ septal motion abnormality (LBBB), gr1 DD, mod TR, PASP 68mmHg; d. 05/2013 MV: Nl EF by SPECT.  . Constipation due to pain medication   . Dementia ([place])   . Depression   . Diarrhea   . Frequency of urination    at night  . GERD (gastroesophageal reflux disease)   . Glaucoma   . Hypertension   . LBBB (left bundle branch block)   . Myasthenia gravis ([place]) 12/09/2016  . Osteoporosis 04/13/2006  . Paget's disease of bone 11/29/2012  . Psychosis ([place])   . Renal disorder    only has one kidney; Right kidney stopped working after last child was born  . Seizures ([place])    approximately 20 years since last seizure  . Swelling of both ankles    Takes Lasix if needed  . Thyroid disease 04/13/2006   hypothyroidism  . TIA (transient ischemic attack)     Past Surgical History: Past Surgical History:  Procedure Laterality Date  . BREAST LUMPECTOMY Left    x 2  . CARDIAC CATHETERIZATION    . CHOLECYSTECTOMY    . ESOPHAGOGASTRODUODENOSCOPY N/A 11/30/2012   Procedure: ESOPHAGOGASTRODUODENOSCOPY (EGD);  Surgeon: Wonda Horner, MD;  Location[facility] ENDOSCOPY;  Service: Endoscopy;  Laterality: N/A;  . EYE SURGERY     cataract removal pt unsure which eye  . FOOT SURGERY Left   . JOINT REPLACEMENT Bilateral    knees  . NEPHRECTOMY  1963  . PATELLAR TENDON REPAIR Right 07/12/2013   Procedure: PRIMARY LEFT PATELLA TENDON REPAIR;  Surgeon: Kerin Salen, MD;  Location: [facility];  Service: Orthopedics;  Laterality: Right;  . SHOULDER SURGERY Right   . TONSILLECTOMY    . TOTAL KNEE REVISION Right 06/19/2013   Procedure: RIGHT TOTAL KNEE REVISION;  Surgeon: Kerin Salen, MD;  Location: [facility];  Service: Orthopedics;  Laterality: Right;  . TUBAL LIGATION      Social History: Social History   Tobacco Use  . Smoking  status: Never Smoker  . Smokeless tobacco: Current User    Types: Snuff  Substance Use Topics  . Alcohol use: No  . Drug use: No   Additional social history: lives at [facility]  Please also refer to relevant sections of EMR.  Family History: Family History  Problem Relation Age of Onset  . Stomach cancer Mother   . Diabetes Brother   . Heart attack Brother     Allergies and Medications: Allergies  Allergen Reactions  . Codeine Nausea Only   No current facility-administered medications on file prior to encounter.    Current Outpatient Medications on File Prior to Encounter  Medication Sig Dispense Refill  . alum & mag hydroxide-simeth (MINTOX) 196-222-97 MG/5ML suspension Take 30 mLs by mouth every 6 (six) hours as needed for indigestion or heartburn. AND CANNOT EXCEED 4 DOSES IN 24 HOURS    . ARIPiprazole (ABILIFY) 2 MG tablet Take 2 mg by mouth daily.    [name] azaTHIOprine (IMURAN) 50 MG tablet Take 2 tablets (100 mg total) by mouth daily. 60 tablet 3  . buPROPion (WELLBUTRIN XL) 300 MG 24 hr tablet Take 300 mg by mouth daily.    . Carboxymethylcellulose Sodium (REFRESH LIQUIGEL OP) Place 1 drop into both eyes 2 (two) times daily as needed (for dryness).    . Cholecalciferol (VITAMIN D3) 2000 units TABS Take 2,000 Units by mouth daily.    . collagenase (SANTYL) ointment Apply 1 application topically daily. 30 grams Nurse treatment UNSTAGEABLE PU/INJURY SACRUM Clean with NS, apply skin prep periwound, apply Santyl Ointment to wound bed, cover with collagen and bordered silicone dressing daily    . cycloSPORINE (RESTASIS) 0.05 % ophthalmic emulsion Place 1 drop into both eyes 2 (two) times daily.    . diclofenac sodium (VOLTAREN) 1 % GEL Apply 2 g topically 4 (four) times daily. Rub into affected area of foot 2 to 4 times daily (Patient taking differently: Apply 2 g topically See admin instructions. Apply 2 grams topically to both feet two times a day for pain) 100 g 2  .  dorzolamidel-timolol (COSOPT PF) 22.3-6.8 MG/ML SOLN ophthalmic solution Place 1 drop into both eyes 2 (two) times daily.    . fentaNYL (DURAGESIC - DOSED MCG/HR) 25 MCG/HR patch Place 1 patch (25 mcg total) onto the skin every 3 (three) days. REMOVE OLD PATCH FIRST (Patient taking differently: Place 37.5 mcg onto the skin every 3 (three) days. REMOVE OLD PATCH FIRST) 5 patch 0  . ferrous sulfate 325 (65 FE) MG tablet Take 325 mg by mouth daily.     [name] HYDROcodone-acetaminophen (NORCO/VICODIN) 5-325 MG tablet Take 1 tablet by mouth 2 (two) times daily as needed for moderate pain.    [name]  isosorbide dinitrate (ISORDIL) 30 MG tablet Take 30 mg by mouth 4 (four) times daily. HOLD FOR SYSTOLIC B/P <585    . Lidocaine (ASPERCREME LIDOCAINE) 4 % PTCH Apply 1 application topically daily.    . Melatonin 3 MG TABS Take 6 mg by mouth at bedtime.     . Menthol, Topical Analgesic, (BIOFREEZE) 4 % GEL Apply 1 Dose topically every 4 (four) hours as needed (Apply to right side as needed).    . Multiple Vitamins-Minerals (CERTAVITE/ANTIOXIDANTS) TABS Take 1 tablet by mouth daily.    . ondansetron (ZOFRAN) 4 MG tablet Take 4 mg by mouth every 4 (four) hours as needed for nausea or vomiting.    . pantoprazole (PROTONIX) 40 MG tablet Take 40 mg by mouth daily.     . polyethylene glycol (MIRALAX / GLYCOLAX) packet Take 17 g by mouth daily as needed (FOR CONSTIPATION). MIXED INTO 4-8 OUNCES OF LIQUID    . predniSONE (DELTASONE) 5 MG tablet Take 4 tablets (20 mg total) by mouth daily with breakfast. 180 tablet 3  . pyridostigmine (MESTINON) 60 MG tablet Take 0.5 tablets (30 mg total) by mouth 3 (three) times daily.    [name] saccharomyces boulardii (FLORASTOR) 250 MG capsule Take 250 mg by mouth daily.    . traZODone (DESYREL) 50 MG tablet Take 75 mg by mouth at bedtime.     . vitamin B-12 (CYANOCOBALAMIN) 100 MCG tablet Take 50 mcg by mouth daily.     [name] amLODipine (NORVASC) 5 MG tablet Take 1 tablet (5 mg total) by mouth daily.  (Patient not taking: Reported on 12/30/2017) 30 tablet 0  . cloNIDine (CATAPRES) 0.1 MG tablet Take 1 tablet (0.1 mg total) by mouth 3 (three) times daily. (Patient not taking: Reported on 12/24/2017) 1 tablet 0  . furosemide (LASIX) 20 MG tablet Take 1 tablet (20 mg total) by mouth daily. (Patient taking differently: Take 20 mg by mouth daily. HOLD FOR SYSTOLIC B/P <277) 90 tablet 3  . NONFORMULARY OR COMPOUNDED ITEM [facility]:  "Authorized Substitute" Pain Cream Formulation - Ibuprofen 15%, Gaclofen 1%, Gabapentin 3%, Lidocaine 2%, apply 2 grams to affected area 3 times daily. (Patient not taking: Reported on 01/12/2018) 120 each 2    Objective: BP (!) 87/54   Pulse 77   Temp 98 F (36.7 C) (Oral)   Resp 20   Ht 5\' 4"  (1.626 m)   Wt 95.3 kg   SpO2 94%   BMI 36.05 kg/m  Exam: General: ill appearing elderly female lying in bed  Eyes: EOMI ENTM: dry mucous membranes Neck: not examined Cardiovascular: RRR, no murmur Respiratory: shallow breathing, no crackles, no accessory muscle use Gastrointestinal: abdomen diffusely tender to palpation, mildly distended Ext: no edema  Derm: no rashes on visualized skin Neuro: CN II-XII grossly intact Psych: mood and affect appropriate, tearful discussing prognosis.   Labs and Imaging: CBC BMET  Recent Labs  Lab 01/02/2018 0858  WBC 9.5  HGB 10.2*  HCT 32.2*  PLT 192   Recent Labs  Lab 12/17/2017 0858  NA 139  K 3.7  CL 106  CO2 22  BUN 66*  CREATININE 4.55*  GLUCOSE 60*  CALCIUM 8.7*     Ct Abdomen Pelvis Wo Contrast  Result Date: 12/30/2017 CLINICAL DATA:  Intraperitoneal free air on radiograph. Concern for bowel perforation. Abdominal pain EXAM: CT ABDOMEN AND PELVIS WITHOUT CONTRAST TECHNIQUE: Multidetector CT imaging of the abdomen and pelvis was performed following the standard protocol without IV contrast. COMPARISON:  Radiograph 12/19/2017, CT 599 FINDINGS: Lower chest: Small bilateral pleural effusions. No  infiltrate. Mild basilar atelectasis. Hepatobiliary: No focal abnormality on noncontrast exam. Postcholecystectomy. Pancreas: Pancreas is normal. No ductal dilatation. No pancreatic inflammation. Spleen: Normal spleen Adrenals/urinary tract: Adrenal glands are normal. Post RIGHT nephrectomy. No hydronephrosis of the LEFT kidney. LEFT ureter and bladder normal. Stomach/Bowel: Large volume intraperitoneal free air collecting non dependently within the peritoneal space and extending beneath the hemidiaphragms and extending along the LEFT pericolic gutter and splenic flexure of the colon. There is no clear perforation site identified however the predominance of gas surrounding distal transverse colon to the splenic flexure is concerning for site of perforation in the transverse or LEFT colon. Gas around the LEFT pararenal space also suggesting a LEFT colon rupture. Oral contrast was administered. There is no evidence of leak of the oral contrast. No fluid collections in the abdomen pelvis. Esophagus and stomach appear normal. Duodenum normal. Small bowel normal caliber. Contrast flows in the distal small bowel. Appendix and cecum normal. Ascending colon normal. Transverse colon is difficult to follow as it is surrounded extraluminal gas colon is relatively collapsed. There are diverticula of the descending colon. No acute inflammation identified. Diverticula sigmoid colon rectum. Vascular/Lymphatic: Abdominal aorta is normal caliber with atherosclerotic calcification. There is no retroperitoneal or periportal lymphadenopathy. No pelvic lymphadenopathy. Reproductive: Uterus normal. Calcified leiomyoma extending from uterine body. Ovaries normal Other: Large volume intraperitoneal free air described above. Musculoskeletal: A thickened trabecula and lytic process in the sacrum is not changed from prior. Findings suggest chronic infection or inflammation such as Paget's disease. IMPRESSION: 1. Large volume intraperitoneal  free air consistent with perforated bowel. Favor transverse colon or LEFT colon as source of perforation as the gas is concentrated along this portion the colon however there is extensive gas throughout the abdomen which makes localization difficult. 2. Diverticulosis of the LEFT colon without clear evidence diverticulitis. 3. No spillage of oral contrast proximally to suggests duodenal rupture. No intraperitoneal free fluid. 4. Normal  solitary LEFT kidney on noncontrast exam. Critical Value/emergent results were called by telephone at the time of interpretation on 01/06/2018 at 12:00 pm to Dr. Rodell Perna , who verbally acknowledged these results. Electronically Signed   By: Suzy Bouchard M.D.   On: 01/04/2018 12:03   Dg Abdomen Acute W/chest  Result Date: 01/04/2018 CLINICAL DATA:  Sharp left abdominal pain EXAM: DG ABDOMEN ACUTE W/ 1V CHEST COMPARISON:  CT 06/22/2017 and previous FINDINGS: Heart size upper limits normal.  Ectatic thoracic aorta. Patchy airspace opacity in the left lower lung. No pleural effusion. No pneumothorax. Moderate amount of free intraperitoneal gas. Stomach, small bowel, and colon are nondilated. Cholecystectomy clips.  Calcified uterine fibroid. Spondylitic changes in the lower thoracic and lumbar spine. DJD in bilateral hips. IMPRESSION: 1. Moderate free intraperitoneal gas consistent with bowel perforation. Critical Value/emergent results were called by telephone at the time of interpretation on 12/27/2017 at 9:53 am to Dr. Ronnald Nian, who verbally acknowledged these results. 2. Focal airspace opacity in the left lower lung, possibly pneumonia. Recommend attention on follow-up to confirm appropriate resolution. Electronically Signed   By: Lucrezia Europe M.D.   On: 12/29/2017 09:54     Sela Hilding, MD 01/10/2018, 4:40 PM PGY-3, [place] [facility] pager: [phone], text pages welcome

## 2018-01-06 ENCOUNTER — Other Ambulatory Visit: Payer: Self-pay

## 2018-01-06 DIAGNOSIS — N179 Acute kidney failure, unspecified: Secondary | ICD-10-CM

## 2018-01-06 DIAGNOSIS — Z515 Encounter for palliative care: Secondary | ICD-10-CM

## 2018-01-06 DIAGNOSIS — I959 Hypotension, unspecified: Secondary | ICD-10-CM

## 2018-01-06 DIAGNOSIS — K659 Peritonitis, unspecified: Secondary | ICD-10-CM

## 2018-01-06 DIAGNOSIS — I5022 Chronic systolic (congestive) heart failure: Secondary | ICD-10-CM

## 2018-01-06 DIAGNOSIS — K631 Perforation of intestine (nontraumatic): Secondary | ICD-10-CM

## 2018-01-06 LAB — MRSA PCR SCREENING: MRSA by PCR: NEGATIVE

## 2018-01-06 MED ORDER — ACETAMINOPHEN 325 MG PO TABS
650.0000 mg | ORAL_TABLET | Freq: Once | ORAL | Status: DC
  Filled 2018-01-06: qty 2

## 2018-01-06 MED ORDER — MORPHINE SULFATE (PF) 2 MG/ML IV SOLN
2.0000 mg | INTRAVENOUS | Status: DC | PRN
  Administered 2018-01-06 – 2018-01-07 (×6): 2 mg via INTRAVENOUS
  Filled 2018-01-06 (×7): qty 1

## 2018-01-06 NOTE — Consult Note (Signed)
NAME:  Jennifer [name], MRN:  [id number], DOB:  1936-01-19, LOS: 1 ADMISSION DATE:  12/18/2017, CONSULTATION DATE:  12/26/2017 REFERRING MD:  Family Practice , CHIEF COMPLAINT:  Abdominal pain    Brief History   Jennifer [name] is an 82 year old woman with a history of diastolic CHF, Myesthenia Gravis (with worsening disease over past year) on prednisone 20mg  daily, imuran and cyclosporine, dementia, HLD, HTN, acute on chronic renal failure, where with abdominal pain x 2 days.    History of present illness   Subacute abdominal pain x several days to weeks, constipation.  Progressed to worsening more severe pain over past two days.  Sent from her SNF.  IN ED became more altered and hypotensive.  Found to have extensive pneumoperitoneum.  Family also notes she is more confused than usual.    Past Medical History  diastolic CHF, Myesthenia Gravis (with worsening disease over past year) on prednisone 20mg  daily, imuran and cyclosporine, dementia, HLD, HTN,  chronic renal failure, Not ambulatory for many months, incontinent, able to feed herself.  Able to interact with family usually.    Significant Hospital Events     Consults:  Surgery 11/22 PCCM for hypotension  Procedures:    Significant Diagnostic Tests:    Micro Data:    Antimicrobials:     Interim history/subjective:  Patient is asking for pain medications. Asking for something stronger than tylenol.    Objective   Blood pressure (!) 105/53, pulse 89, temperature 98.7 F (37.1 C), temperature source Oral, resp. rate 20, height 5\' 6"  (1.676 m), weight 101.4 kg, SpO2 93 %.        Intake/Output Summary (Last 24 hours) at 01/06/2018 2150 Last data filed at 01/06/2018 1258 Gross per 24 hour  Intake 60 ml  Output 200 ml  Net -140 ml   Filed Weights   12/20/2017 0852 01/10/2018 1751  Weight: 95.3 kg 101.4 kg    Examination: General: Elderly, obese frail woman HENT: NCAT, mm slightly dry Lungs: CTAB  Cardiovascular:  tachycardia, hypotension, no MGR  Abdomen:  Tenderness diffusely with withdrawal of pressure. Non tender with gentle palpation Extremities: Non pitting edema B  Neuro: Confused, not oriented to place or time.   Resolved Hospital Problem list     Assessment & Plan:  Intestinal perforation, acute: likely 2/2 perforated diverticulum.  Hypotension, likely septic shock.  Discussed what aggressive care would be like for her, including an aggressive abdominal surgery and a very difficult and uncomfortable recovery for her at best.  I expressed by concerns that given her recent decline and baseline frailty, her chances of recovery to return to her current quality of life are very unlikely.  Discussed possibility she not survive surgery, need for intubation and central line placement, likely difficulty weaning from the vent and anticipated risk of infection and poor wound healing given chronic immunosupresssion and steroid use.  Other problems like CHF and AKI on CKD make her chances of recovery even worse.  Discussed the expense of their mother going through suffering for the small possibility she does recover to current quality of life.    Family has decided to proceed with comfort care, as they do not believe she would want surgery and aggressive care if she could currently make complex decisions.  Patient is currently desperately asking for pain relief.  Family practice to admit for comfort care.   Thank you for consult!   Labs   CBC: Recent Labs  Lab 01/04/18 1016 01/02/2018  0858  WBC 8.0 9.5  NEUTROABS 6.8  --   HGB 10.8* 10.2*  HCT 31.3* 32.2*  MCV 96 104.5*  PLT 249 937    Basic Metabolic Panel: Recent Labs  Lab 01/04/18 1016 12/31/2017 0858  NA 140 139  K 4.0 3.7  CL 102 106  CO2 21 22  GLUCOSE 73 60*  BUN 59* 66*  CREATININE 3.74* 4.55*  CALCIUM 9.2 8.7*   GFR: Estimated Creatinine Clearance: 11.8 mL/min (A) (by C-G formula based on SCr of 4.55 mg/dL (H)). Recent Labs    Lab 01/04/18 1016 01/07/2018 0858  WBC 8.0 9.5    Liver Function Tests: Recent Labs  Lab 01/04/18 1016 12/15/2017 0858  AST 21 21  ALT 17 19  ALKPHOS 97 88  BILITOT 0.6 1.1  PROT 5.4* 5.2*  ALBUMIN 2.9* 2.1*   Recent Labs  Lab 01/04/18 1016 01/03/2018 0858  LIPASE  --  20  AMYLASE 30*  --    No results for input(s): AMMONIA in the last 168 hours.  ABG    Component Value Date/Time   PHART 7.348 (L) 06/15/2017 0154   PCO2ART 48.8 (H) 06/15/2017 0154   PO2ART 112.0 (H) 06/15/2017 0154   HCO3 26.8 06/15/2017 0154   TCO2 28 06/15/2017 0154   O2SAT 98.0 06/15/2017 0154     Coagulation Profile: No results for input(s): INR, PROTIME in the last 168 hours.  Cardiac Enzymes: No results for input(s): CKTOTAL, CKMB, CKMBINDEX, TROPONINI in the last 168 hours.  HbA1C: Hgb A1c MFr Bld  Date/Time Value Ref Range Status  06/16/2016 07:34 PM 4.9 4.8 - 5.6 % Final    Comment:    (NOTE)         Pre-diabetes: 5.7 - 6.4         Diabetes: >6.4         Glycemic control for adults with diabetes: <7.0     CBG: Recent Labs  Lab 01/01/2018 1343 12/31/2017 1455  GLUCAP 53* 139*    Review of Systems:   Unable to assess due to mental status  Past Medical History  She,  has a past medical history of Anemia, Anxiety, Arthritis, Atypical chest pain, Blood transfusion, Cardiomyopathy, nonischemic ([place]), Constipation due to pain medication, Dementia ([place]), Depression, Diarrhea, Frequency of urination, GERD (gastroesophageal reflux disease), Glaucoma, Hypertension, LBBB (left bundle branch block), Myasthenia gravis ([place]) (12/09/2016), Osteoporosis (04/13/2006), Paget's disease of bone (11/29/2012), Psychosis ([place]), Renal disorder, Seizures ([place]), Swelling of both ankles, Thyroid disease (04/13/2006), and TIA (transient ischemic attack).   Surgical History    Past Surgical History:  Procedure Laterality Date  . BREAST LUMPECTOMY Left    x 2  . CARDIAC CATHETERIZATION    .  CHOLECYSTECTOMY    . ESOPHAGOGASTRODUODENOSCOPY N/A 11/30/2012   Procedure: ESOPHAGOGASTRODUODENOSCOPY (EGD);  Surgeon: Wonda Horner, MD;  Location[facility] ENDOSCOPY;  Service: Endoscopy;  Laterality: N/A;  . EYE SURGERY     cataract removal pt unsure which eye  . FOOT SURGERY Left   . JOINT REPLACEMENT Bilateral    knees  . NEPHRECTOMY  1963  . PATELLAR TENDON REPAIR Right 07/12/2013   Procedure: PRIMARY LEFT PATELLA TENDON REPAIR;  Surgeon: Kerin Salen, MD;  Location: [facility];  Service: Orthopedics;  Laterality: Right;  . SHOULDER SURGERY Right   . TONSILLECTOMY    . TOTAL KNEE REVISION Right 06/19/2013   Procedure: RIGHT TOTAL KNEE REVISION;  Surgeon: Kerin Salen, MD;  Location: [facility];  Service: Orthopedics;  Laterality:  Right;  [name] TUBAL LIGATION       Social History   reports that she has never smoked. Her smokeless tobacco use includes snuff. She reports that she does not drink alcohol or use drugs.   Family History   Her family history includes Diabetes in her brother; Heart attack in her brother; Stomach cancer in her mother.   Allergies Allergies  Allergen Reactions  . Codeine Nausea Only     Home Medications  Prior to Admission medications   Medication Sig Start Date End Date Taking? Authorizing Provider  alum & mag hydroxide-simeth ([place]) 200-200-20 MG/5ML suspension Take 30 mLs by mouth every 6 (six) hours as needed for indigestion or heartburn. AND CANNOT EXCEED 4 DOSES IN 24 HOURS   Yes [provider]  ARIPiprazole (ABILIFY) 2 MG tablet Take 2 mg by mouth daily.   Yes [provider]  azaTHIOprine (IMURAN) 50 MG tablet Take 2 tablets (100 mg total) by mouth daily. 03/13/17  Yes Kathrynn Ducking, MD  buPROPion (WELLBUTRIN XL) 300 MG 24 hr tablet Take 300 mg by mouth daily.   Yes [provider]  Carboxymethylcellulose Sodium (REFRESH LIQUIGEL OP) Place 1 drop into both eyes 2 (two) times daily as needed (for dryness).   Yes [provider]  Cholecalciferol (VITAMIN D3) 2000 units TABS Take 2,000 Units by mouth daily.   Yes [provider]  collagenase (SANTYL) ointment Apply 1 application topically daily. 30 grams Nurse treatment UNSTAGEABLE PU/INJURY SACRUM Clean with NS, apply skin prep periwound, apply Santyl Ointment to wound bed, cover with collagen and bordered silicone dressing daily   Yes [provider]  cycloSPORINE (RESTASIS) 0.05 % ophthalmic emulsion Place 1 drop into both eyes 2 (two) times daily.   Yes [provider]  diclofenac sodium (VOLTAREN) 1 % GEL Apply 2 g topically 4 (four) times daily. Rub into affected area of foot 2 to 4 times daily Patient taking differently: Apply 2 g topically See admin instructions. Apply 2 grams topically to both feet two times a day for pain 03/10/16  Yes Trula Slade, DPM  dorzolamidel-timolol (COSOPT PF) 22.3-6.8 MG/ML SOLN ophthalmic solution Place 1 drop into both eyes 2 (two) times daily.   Yes [provider]  fentaNYL (DURAGESIC - DOSED MCG/HR) 25 MCG/HR patch Place 1 patch (25 mcg total) onto the skin every 3 (three) days. [place] FIRST Patient taking differently: Place 37.5 mcg onto the skin every 3 (three) days. REMOVE OLD PATCH FIRST 06/24/17  Yes Shelly Coss, MD  ferrous sulfate 325 (65 FE) MG tablet Take 325 mg by mouth daily.    Yes [provider]  HYDROcodone-acetaminophen (NORCO/VICODIN) 5-325 MG tablet Take 1 tablet by mouth 2 (two) times daily as needed for moderate pain.   Yes [provider]  isosorbide dinitrate (ISORDIL) 30 MG tablet Take 30 mg by mouth 4 (four) times daily. HOLD FOR SYSTOLIC B/P <381   Yes [provider]  Lidocaine (ASPERCREME LIDOCAINE) 4 % PTCH Apply 1 application topically daily.   Yes [provider]  Melatonin 3 MG TABS Take 6 mg by mouth at bedtime.    Yes [provider]  Menthol, Topical Analgesic, (BIOFREEZE) 4 % GEL Apply  1 Dose topically every 4 (four) hours as needed (Apply to right side as needed).   Yes [provider]  Multiple Vitamins-Minerals (CERTAVITE/ANTIOXIDANTS) TABS Take 1 tablet by mouth daily.   Yes [provider]  ondansetron (ZOFRAN) 4  MG tablet Take 4 mg by mouth every 4 (four) hours as needed for nausea or vomiting.   Yes [provider]  pantoprazole (PROTONIX) 40 MG tablet Take 40 mg by mouth daily.    Yes [provider]  polyethylene glycol (MIRALAX / GLYCOLAX) packet Take 17 g by mouth daily as needed (FOR CONSTIPATION). MIXED INTO 4-8 OUNCES OF LIQUID   Yes [provider]  predniSONE (DELTASONE) 5 MG tablet Take 4 tablets (20 mg total) by mouth daily with breakfast. 01/04/18  Yes Kathrynn Ducking, MD  pyridostigmine (MESTINON) 60 MG tablet Take 0.5 tablets (30 mg total) by mouth 3 (three) times daily. 03/13/17  Yes Kathrynn Ducking, MD  saccharomyces boulardii (FLORASTOR) 250 MG capsule Take 250 mg by mouth daily.   Yes [provider]  traZODone (DESYREL) 50 MG tablet Take 75 mg by mouth at bedtime.    Yes [provider]  vitamin B-12 (CYANOCOBALAMIN) 100 MCG tablet Take 50 mcg by mouth daily.    Yes [provider]  amLODipine (NORVASC) 5 MG tablet Take 1 tablet (5 mg total) by mouth daily. Patient not taking: Reported on 01/04/2018 06/25/17   Shelly Coss, MD  cloNIDine (CATAPRES) 0.1 MG tablet Take 1 tablet (0.1 mg total) by mouth 3 (three) times daily. Patient not taking: Reported on 01/13/2018 06/24/17   Shelly Coss, MD  furosemide (LASIX) 20 MG tablet Take 1 tablet (20 mg total) by mouth daily. Patient taking differently: Take 20 mg by mouth daily. HOLD FOR SYSTOLIC B/P <211 9/41/74 06/14/17  Strader, Fransisco Hertz, PA-C  NONFORMULARY OR COMPOUNDED ITEM [facility]:  "Authorized Substitute" Pain Cream Formulation - Ibuprofen 15%, Gaclofen 1%, Gabapentin 3%, Lidocaine 2%, apply 2 grams to affected area 3  times daily. Patient not taking: Reported on 12/21/2017 03/16/16   Trula Slade, DPM     Critical care time: 90 min

## 2018-01-06 NOTE — Progress Notes (Signed)
Palliative Medicine consult noted. Due to high referral volume, there may be a delay seeing this patient. Please call the Palliative Medicine Team office at [phone] if recommendations are needed in the interim.  Thank you for inviting [place] to see this patient.  No charge note

## 2018-01-06 NOTE — Progress Notes (Signed)
Family Medicine Teaching Service Daily Progress Note Intern Pager: [phone]  Patient name: Eda Magnussen Finkel Medical record number: [id number] Date of birth: 1935/10/31 Age: 82 y.o. Gender: female  Primary Care Provider: Janie Morning, DO Consultants: CCM, surgery (signed off) Code Status: DNR/DNI, comfort care  Pt Overview and Major Events to Date:  11/22: Patient admitted, shock secondary to bowel per, comfort care  Assessment and Plan: Comfort care 2/2 bowel perforation, septic shock Long discussion with CCM, surgery, patient, and patient's family.  Patient felt to have significantly elevated risk of mortality from procedure.  Decision made to make patient comfort care.  Patient was stable vital signs and is alert although not oriented.  Complaining of some abdominal pain this morning will increase morphine dosing. -DNR/DNI -RN may pronounce -Regular diet -Discontinue labs -Daily vitals -Morphine 2 mg every 2 hours as needed, will titrate as needed -Ativan 1 mg every 4 hours as needed for anxiety, titrate as needed -Placed palliative consult, appreciate any recs  Myasthenia gravis Took Mestinon 30 mg 3 times daily as outpatient.  Also took azathioprine and will hold this medication secondary to comfort care status.   Hypertension Patient took Norvasc, Catapres, Lasix, Isordil as outpatient.  Holding secondary to comfort care status.  GERD Patient with Protonix as outpatient.  Will hold his medication while inpatient, can restart if patient has significant GERD symptoms.   FEN/GI: regular diet PPx: none  Disposition: comfort care  Subjective:  Patient is alert and responsive.  Is not oriented and does not respond appropriately.  Complaining of abdominal pain and headache.  Daughter at bedside, discussed plan of care.  Objective: Temp:  [98 F (36.7 C)-98.7 F (37.1 C)] 98.7 F (37.1 C) (11/23 0706) Pulse Rate:  [67-89] 89 (11/23 0706) Resp:  [11-22] 20 (11/23  0706) BP: (79-107)/(41-60) 105/53 (11/23 0706) SpO2:  [90 %-95 %] 93 % (11/23 0706) Weight:  [95.3 kg-101.4 kg] 101.4 kg (11/22 1751) Physical Exam: General: Obese elderly African-American female.  Resting comfortably Cardiovascular: Regular rate and rhythm, palpable peripheral pulses Respiratory: Lungs clear all station bilaterally, no increased work of breathing, no respiratory distress Abdomen: Soft, nontender, nondistended Extremities: Warm, well-perfused  Laboratory: Recent Labs  Lab 01/04/18 1016 01/01/2018 0858  WBC 8.0 9.5  HGB 10.8* 10.2*  HCT 31.3* 32.2*  PLT 249 192   Recent Labs  Lab 01/04/18 1016 01/02/2018 0858  NA 140 139  K 4.0 3.7  CL 102 106  CO2 21 22  BUN 59* 66*  CREATININE 3.74* 4.55*  CALCIUM 9.2 8.7*  PROT 5.4* 5.2*  BILITOT 0.6 1.1  ALKPHOS 97 88  ALT 17 19  AST 21 21  GLUCOSE 73 60*    Imaging/Diagnostic Tests:  FINDINGS: Lower chest: Small bilateral pleural effusions. No infiltrate. Mild basilar atelectasis.  Hepatobiliary: No focal abnormality on noncontrast exam. Postcholecystectomy.  Pancreas: Pancreas is normal. No ductal dilatation. No pancreatic inflammation.  Spleen: Normal spleen  Adrenals/urinary tract: Adrenal glands are normal. Post RIGHT nephrectomy. No hydronephrosis of the LEFT kidney. LEFT ureter and bladder normal.  Stomach/Bowel: Large volume intraperitoneal free air collecting non dependently within the peritoneal space and extending beneath the hemidiaphragms and extending along the LEFT pericolic gutter and splenic flexure of the colon.  There is no clear perforation site identified however the predominance of gas surrounding distal transverse colon to the splenic flexure is concerning for site of perforation in the transverse or LEFT colon. Gas around the LEFT pararenal space also suggesting a LEFT colon rupture.  Oral contrast was administered. There is no evidence of leak of the oral contrast. No  fluid collections in the abdomen pelvis.  Esophagus and stomach appear normal. Duodenum normal. Small bowel normal caliber. Contrast flows in the distal small bowel. Appendix and cecum normal. Ascending colon normal. Transverse colon is difficult to follow as it is surrounded extraluminal gas colon is relatively collapsed. There are diverticula of the descending colon. No acute inflammation identified. Diverticula sigmoid colon rectum.  Vascular/Lymphatic: Abdominal aorta is normal caliber with atherosclerotic calcification. There is no retroperitoneal or periportal lymphadenopathy. No pelvic lymphadenopathy.  Reproductive: Uterus normal. Calcified leiomyoma extending from uterine body. Ovaries normal  Other: Large volume intraperitoneal free air described above.  Musculoskeletal: A thickened trabecula and lytic process in the sacrum is not changed from prior. Findings suggest chronic infection or inflammation such as Paget's disease.  IMPRESSION: 1. Large volume intraperitoneal free air consistent with perforated bowel. Favor transverse colon or LEFT colon as source of perforation as the gas is concentrated along this portion the colon however there is extensive gas throughout the abdomen which makes localization difficult. 2. Diverticulosis of the LEFT colon without clear evidence diverticulitis. 3. No spillage of oral contrast proximally to suggests duodenal rupture. No intraperitoneal free fluid. 4. Normal  solitary LEFT kidney on noncontrast exam.  Guadalupe Dawn, MD 01/06/2018, 8:29 AM PGY-2, [place] Intern pager: [phone], text pages welcome

## 2018-01-07 DIAGNOSIS — R1084 Generalized abdominal pain: Secondary | ICD-10-CM

## 2018-01-07 DIAGNOSIS — R110 Nausea: Secondary | ICD-10-CM

## 2018-01-07 DIAGNOSIS — F411 Generalized anxiety disorder: Secondary | ICD-10-CM

## 2018-01-07 DIAGNOSIS — I7409 Other arterial embolism and thrombosis of abdominal aorta: Secondary | ICD-10-CM

## 2018-01-07 DIAGNOSIS — Z515 Encounter for palliative care: Secondary | ICD-10-CM

## 2018-01-07 MED ORDER — ONDANSETRON HCL 4 MG/2ML IJ SOLN
4.0000 mg | Freq: Two times a day (BID) | INTRAMUSCULAR | Status: DC
  Administered 2018-01-07 – 2018-01-12 (×10): 4 mg via INTRAVENOUS
  Filled 2018-01-07 (×9): qty 2

## 2018-01-07 MED ORDER — CYCLOSPORINE 0.05 % OP EMUL
1.0000 [drp] | Freq: Two times a day (BID) | OPHTHALMIC | Status: DC
  Administered 2018-01-07 – 2018-01-13 (×9): 1 [drp] via OPHTHALMIC
  Filled 2018-01-07 (×14): qty 1

## 2018-01-07 MED ORDER — ONDANSETRON HCL 4 MG/2ML IJ SOLN: 4.0000 mg | Freq: Three times a day (TID) | INTRAMUSCULAR | Status: DC | PRN

## 2018-01-07 MED ORDER — GLYCOPYRROLATE 0.2 MG/ML IJ SOLN: 0.2000 mg | INTRAMUSCULAR | Status: DC | PRN

## 2018-01-07 MED ORDER — LORAZEPAM 2 MG/ML IJ SOLN
0.5000 mg | Freq: Every day | INTRAMUSCULAR | Status: DC
  Administered 2018-01-07 – 2018-01-10 (×4): 0.5 mg via INTRAVENOUS
  Filled 2018-01-07 (×4): qty 1

## 2018-01-07 MED ORDER — DORZOLAMIDE HCL-TIMOLOL MAL 2-0.5 % OP SOLN
1.0000 [drp] | Freq: Two times a day (BID) | OPHTHALMIC | Status: DC
  Administered 2018-01-07 – 2018-01-13 (×9): 1 [drp] via OPHTHALMIC
  Filled 2018-01-07: qty 10

## 2018-01-07 MED ORDER — WHITE PETROLATUM EX OINT
TOPICAL_OINTMENT | CUTANEOUS | Status: AC
  Filled 2018-01-07: qty 28.35

## 2018-01-07 MED ORDER — MORPHINE SULFATE (PF) 2 MG/ML IV SOLN
2.0000 mg | INTRAVENOUS | Status: DC | PRN
  Administered 2018-01-07 – 2018-01-08 (×3): 2 mg via INTRAVENOUS
  Administered 2018-01-08: 4 mg via INTRAVENOUS
  Administered 2018-01-09 – 2018-01-10 (×3): 2 mg via INTRAVENOUS
  Filled 2018-01-07 (×2): qty 1
  Filled 2018-01-07: qty 2
  Filled 2018-01-07 (×3): qty 1

## 2018-01-07 NOTE — Progress Notes (Signed)
Family Medicine Teaching Service Daily Progress Note Intern Pager: [phone]  Patient name: Jennifer [name] Medical record number: [id number] Date of birth: 1935/11/19 Age: 82 y.o. Gender: female  Primary Care Provider: Janie Morning, DO Consultants: CCM, surgery (signed off) Code Status: DNR/DNI, comfort care  Pt Overview and Major Events to Date:  11/22: Patient admitted, shock secondary to bowel per, comfort care  Assessment and Plan: Comfort care 2/2 bowel perforation, septic shock  Long discussion 11/23 with CCM, surgery, patient, and patient's family.  Patient felt to have significantly elevated risk of mortality from procedure.  Decision made to make patient comfort care.  Patient was stable vital signs and is alert although not oriented.  Patient not verbalizing abdominal pain am 11/24 but was "fidgety" in bed per daughter and concern I she is uncomfortable. -DNR/DNI -RN may pronounce -Regular diet -Discontinue labs -Daily vitals -Morphine 2 mg every 2 hours as needed, will titrate as needed -Ativan 1 mg every 4 hours as needed for anxiety, titrate as needed -Placed palliative consult, appreciate any recs (family does not want to return to prior facility) -family requested chaplain so consult placed  Myasthenia gravis Took Mestinon 30 mg 3 times daily as outpatient.  Also took azathioprine and will hold this medication secondary to comfort care status.   Hypertension Patient took Norvasc, Catapres, Lasix, Isordil as outpatient.  Holding secondary to comfort care status.  GERD Patient with Protonix as outpatient.  Will hold his medication while inpatient, can restart if patient has significant GERD symptoms.   FEN/GI: regular diet PPx: none  Disposition: comfort care  Subjective:  Patient was fidgety on exam, not expressing pain, some conversation but was not well oriented, pleasant and did not appear SOB  Objective: Temp:  [98.7 F (37.1 C)] 98.7 F (37.1 C)  (11/23 0706) Pulse Rate:  [89] 89 (11/23 0706) Resp:  [20] 20 (11/23 0706) BP: (105)/(53) 105/53 (11/23 0706) SpO2:  [93 %] 93 % (11/23 0706) Physical Exam: General: Obese elderly African-American female.  Resting comfortably Cardiovascular: Regular rate and rhythm, palpable peripheral pulses Respiratory: Lungs clear all station bilaterally, no increased work of breathing, breathing was somewhat shallow, no respiratory distress Abdomen: Soft, nontender, nondistended Extremities: Warm, well-perfused  Laboratory: Recent Labs  Lab 01/04/18 1016 01/13/2018 0858  WBC 8.0 9.5  HGB 10.8* 10.2*  HCT 31.3* 32.2*  PLT 249 192   Recent Labs  Lab 01/04/18 1016 12/23/2017 0858  NA 140 139  K 4.0 3.7  CL 102 106  CO2 21 22  BUN 59* 66*  CREATININE 3.74* 4.55*  CALCIUM 9.2 8.7*  PROT 5.4* 5.2*  BILITOT 0.6 1.1  ALKPHOS 97 88  ALT 17 19  AST 21 21  GLUCOSE 73 60*    Imaging/Diagnostic Tests:  FINDINGS: Lower chest: Small bilateral pleural effusions. No infiltrate. Mild basilar atelectasis.  Hepatobiliary: No focal abnormality on noncontrast exam. Postcholecystectomy.  Pancreas: Pancreas is normal. No ductal dilatation. No pancreatic inflammation.  Spleen: Normal spleen  Adrenals/urinary tract: Adrenal glands are normal. Post RIGHT nephrectomy. No hydronephrosis of the LEFT kidney. LEFT ureter and bladder normal.  Stomach/Bowel: Large volume intraperitoneal free air collecting non dependently within the peritoneal space and extending beneath the hemidiaphragms and extending along the LEFT pericolic gutter and splenic flexure of the colon.  There is no clear perforation site identified however the predominance of gas surrounding distal transverse colon to the splenic flexure is concerning for site of perforation in the transverse or LEFT colon. Gas around the  LEFT pararenal space also suggesting a LEFT colon rupture.  Oral contrast was administered. There is no  evidence of leak of the oral contrast. No fluid collections in the abdomen pelvis.  Esophagus and stomach appear normal. Duodenum normal. Small bowel normal caliber. Contrast flows in the distal small bowel. Appendix and cecum normal. Ascending colon normal. Transverse colon is difficult to follow as it is surrounded extraluminal gas colon is relatively collapsed. There are diverticula of the descending colon. No acute inflammation identified. Diverticula sigmoid colon rectum.  Vascular/Lymphatic: Abdominal aorta is normal caliber with atherosclerotic calcification. There is no retroperitoneal or periportal lymphadenopathy. No pelvic lymphadenopathy.  Reproductive: Uterus normal. Calcified leiomyoma extending from uterine body. Ovaries normal  Other: Large volume intraperitoneal free air described above.  Musculoskeletal: A thickened trabecula and lytic process in the sacrum is not changed from prior. Findings suggest chronic infection or inflammation such as Paget's disease.  IMPRESSION: 1. Large volume intraperitoneal free air consistent with perforated bowel. Favor transverse colon or LEFT colon as source of perforation as the gas is concentrated along this portion the colon however there is extensive gas throughout the abdomen which makes localization difficult. 2. Diverticulosis of the LEFT colon without clear evidence diverticulitis. 3. No spillage of oral contrast proximally to suggests duodenal rupture. No intraperitoneal free fluid. 4. Normal  solitary LEFT kidney on noncontrast exam.  Sherene Sires, DO 01/07/2018, 5:00 AM PGY-2, [place] Intern pager: [phone], text pages welcome

## 2018-01-07 NOTE — Progress Notes (Signed)
   01/07/18 1400  Clinical Encounter Type  Visited With Patient and family together  Visit Type Initial  Referral From Nurse  Consult/Referral To Chaplain  Spiritual Encounters  Spiritual Needs Emotional;Prayer  Stress Factors  Patient Stress Factors Health changes  Family Stress Factors Exhausted;Family relationships   Responded to spiritual care consult. PT was alert and family was at bedside. Daughter was very thankful for the Chaplain visit. I offered spiritual care with words of encouragement, ministry of presence, a listening ear and prayer. Family were in tears and thankful for the care received. Chaplain available as needed.   Chaplain Fidel Levy [phone]

## 2018-01-07 NOTE — Consult Note (Signed)
Consultation Note Date: 01/07/2018   Patient Name: Jennifer [name]  DOB: 1935-06-26  MRN: [id number]  Age / Sex: 82 y.o., female  PCP: Janie Morning, DO Referring Physician: Martyn Malay, MD  Reason for Consultation: Establishing goals of care and Terminal Care  HPI/Patient Profile: 82 y.o. female  with past medical history of myasthenia gravis on chronic prednisone, HTN, CKD, cardiomyopathy, and chronic pain admitted on 12/23/2017 with LLQ abdominal pain. Patient admitted for septic shock secondary to bowel perforation. Surgery was consulted and after detailed discussions with attending and surgery, patient/family made decision against surgical intervention due to high risk of mortality and with underlying co-morbidities. Decision was made for DNR and comfort measures. Palliative medicine consultation for goals of care/terminal care.   Clinical Assessment and Goals of Care:  I have reviewed medical records, discussed with RN, and assessed the patient at bedside. Ms. Peaden is awake, alert and communicating with family. She complains of abdominal discomfort. Multiple family members at bedside visited.   Spoke with daughter, Venida Jarvis, outside of the room to discuss diagnosis, prognosis, GOC, EOL wishes, disposition and options.  Introduced Palliative Medicine as specialized medical care for people living with serious illness. It focuses on providing relief from the symptoms and stress of a serious illness.   Discussed events leading up to hospitalization and course of hospital diagnosis and interventions. Sherri tearful throughout the conversation. She confirms her understand of diagnoses and poor prognosis. Sherri confirms her mother's decision against surgical intervention, knowing high risk for surgery and ability to come off of ventilator with underlying myasthenia gravis. Sherri shares that her mother has  been "saved" and the day this decision was made, she prayed for forgiveness for all of her past mistakes. Sherri shares that her mother is "tired of hurting" and is at peace with this decision. Sherri and the family are also at peace knowing the decision was made by Ms. Batch.   Discussed role of comfort measures and symptom management to ensure comfort, peace, and dignity at EOL. Sherri is prepared that anything could happen at any time. Discussed EOL expectations.   Discussed at length medications for symptom management. Sherri is requesting that her mother's eye drops are re-started. Her mother was asking to take the eye drops earlier. Sherri also shares that her mother has been mildly nauseous and "fidgity." We discussed scheduling nausea medication as well as HS ativan for anxiety/sleep. Daughter agreeable. We also discussed liberal use of morphine for pain and dyspnea.   Hospice services outpatient were explained and offered. Venida Jarvis is requesting her mother not return to [facility]. She feels EOL care will be better managed at a hospice facility. We discussed inpatient hospice facility and continued goal for comfort, peace, and dignity at EOL. Also use of same medications to ensure comfort. Discussed comfort feeds. Her mother is only accepting bites. We discussed watchful waiting overnight and f/u tomorrow morning to further discuss hospice facility eligibility.   Therapeutic listening as Venida Jarvis shares stories of her mother, family, and their  strong faith. Emotional/spiritual support provided.   Questions and concerns were addressed. PMT contact information given.    SUMMARY OF RECOMMENDATIONS    DNR/DNI. Patient has declined surgical intervention, understanding high mortality risk. Daughter/family respects her decision.   Comfort measures only.  Symptom management--see below.   Introduced hospice options. Daughter considering and we will further discuss in AM if patient stable for  transfer.   Code Status/Advance Care Planning:  DNR   Symptom Management:   Morphine 2-4mg  IV q2h prn pain/dyspnea/air hunger  Ativan 0.5mg  IV HS anxiety/sleep  Continue Ativan 1mg  IV q4h prn anxiety  Zofran 4mg  IV BID nausea  Zofran 4mg  IV q8h prn refractor nausea/vomiting  Robinul 0.2mg  IV q4h prn secretions  Patient's home eye drops restarted per patient/daughter request for comfort.   Palliative Prophylaxis:   Aspiration, Delirium Protocol, Frequent Pain Assessment, Oral Care and Turn Reposition  Additional Recommendations (Limitations, Scope, Preferences):  Full Comfort Care  Psycho-social/Spiritual:   Desire for further Chaplaincy support:yes  Additional Recommendations: Caregiving  Support/Resources, Compassionate Wean Education and Education on Hospice  Prognosis:   < 2 weeks: if not days with sepsis secondary to bowel perforation. Patient/family decline surgical intervention and request for comfort focused care.   Discharge Planning: To Be Determined: Possibly hospice facility if patient remains stable for transfer.       Primary Diagnoses: Present on Admission: **None**   I have reviewed the medical record, interviewed the patient and family, and examined the patient. The following aspects are pertinent.  Past Medical History:  Diagnosis Date  . Anemia   . Anxiety   . Arthritis   . Atypical chest pain    a. 2006 Cath: nl cors;  b. 05/2010 MV: no ischemia;  c. 05/2013 MV: nl EF, no ischemia. d. 06/2016: NST showing no ischemia or prior infarction  . Blood transfusion   . Cardiomyopathy, nonischemic ([place])    a. 2006 EF initially 30%;  b. 10/2009 Echo EF now 50-55%; c. 11/2012 Echo: EF 45-50% w/ septal motion abnormality (LBBB), gr1 DD, mod TR, PASP 62mmHg; d. 05/2013 MV: Nl EF by SPECT.  . Constipation due to pain medication   . Dementia ([place])   . Depression   . Diarrhea   . Frequency of urination    at night  . GERD (gastroesophageal reflux  disease)   . Glaucoma   . Hypertension   . LBBB (left bundle branch block)   . Myasthenia gravis ([place]) 12/09/2016  . Osteoporosis 04/13/2006  . Paget's disease of bone 11/29/2012  . Psychosis ([place])   . Renal disorder    only has one kidney; Right kidney stopped working after last child was born  . Seizures ([place])    approximately 20 years since last seizure  . Swelling of both ankles    Takes Lasix if needed  . Thyroid disease 04/13/2006   hypothyroidism  . TIA (transient ischemic attack)    Social History   Socioeconomic History  . Marital status: Divorced    Spouse name: Not on file  . Number of children: Not on file  . Years of education: Not on file  . Highest education level: Not on file  Occupational History  . Not on file  Social Needs  . Financial resource strain: Not on file  . Food insecurity:    Worry: Not on file    Inability: Not on file  . Transportation needs:    Medical: Not on file    Non-medical: Not on  file  Tobacco Use  . Smoking status: Never Smoker  . Smokeless tobacco: Current User    Types: Snuff  Substance and Sexual Activity  . Alcohol use: No  . Drug use: No  . Sexual activity: Not on file  Lifestyle  . Physical activity:    Days per week: Not on file    Minutes per session: Not on file  . Stress: Not on file  Relationships  . Social connections:    Talks on phone: Not on file    Gets together: Not on file    Attends religious service: Not on file    Active member of club or organization: Not on file    Attends meetings of clubs or organizations: Not on file    Relationship status: Not on file  Other Topics Concern  . Not on file  Social History Narrative  . Not on file   Family History  Problem Relation Age of Onset  . Stomach cancer Mother   . Diabetes Brother   . Heart attack Brother    Scheduled Meds: . acetaminophen  650 mg Oral Once  . cycloSPORINE  1 drop Both Eyes BID  . dorzolamide-timolol  1 drop Both Eyes BID    . LORazepam  0.5 mg Intravenous QHS  . ondansetron (ZOFRAN) IV  4 mg Intravenous BID   Continuous Infusions:  PRN Meds:.glycopyrrolate, LORazepam **OR** LORazepam **OR** LORazepam, morphine injection, ondansetron (ZOFRAN) IV Medications Prior to Admission:  Prior to Admission medications   Medication Sig Start Date End Date Taking? Authorizing Provider  alum & mag hydroxide-simeth ([place]) 200-200-20 MG/5ML suspension Take 30 mLs by mouth every 6 (six) hours as needed for indigestion or heartburn. AND CANNOT EXCEED 4 DOSES IN 24 HOURS   Yes [provider]  ARIPiprazole (ABILIFY) 2 MG tablet Take 2 mg by mouth daily.   Yes [provider]  azaTHIOprine (IMURAN) 50 MG tablet Take 2 tablets (100 mg total) by mouth daily. 03/13/17  Yes Kathrynn Ducking, MD  buPROPion (WELLBUTRIN XL) 300 MG 24 hr tablet Take 300 mg by mouth daily.   Yes [provider]  Carboxymethylcellulose Sodium (REFRESH LIQUIGEL OP) Place 1 drop into both eyes 2 (two) times daily as needed (for dryness).   Yes [provider]  Cholecalciferol (VITAMIN D3) 2000 units TABS Take 2,000 Units by mouth daily.   Yes [provider]  collagenase (SANTYL) ointment Apply 1 application topically daily. 30 grams Nurse treatment UNSTAGEABLE PU/INJURY SACRUM Clean with NS, apply skin prep periwound, apply Santyl Ointment to wound bed, cover with collagen and bordered silicone dressing daily   Yes [provider]  cycloSPORINE (RESTASIS) 0.05 % ophthalmic emulsion Place 1 drop into both eyes 2 (two) times daily.   Yes [provider]  diclofenac sodium (VOLTAREN) 1 % GEL Apply 2 g topically 4 (four) times daily. Rub into affected area of foot 2 to 4 times daily Patient taking differently: Apply 2 g topically See admin instructions. Apply 2 grams topically to both feet two times a day for pain 03/10/16  Yes Trula Slade, DPM  dorzolamidel-timolol (COSOPT PF) 22.3-6.8 MG/ML  SOLN ophthalmic solution Place 1 drop into both eyes 2 (two) times daily.   Yes [provider]  fentaNYL (DURAGESIC - DOSED MCG/HR) 25 MCG/HR patch Place 1 patch (25 mcg total) onto the skin every 3 (three) days. [place] FIRST Patient taking differently: Place 37.5 mcg onto the skin every 3 (three) days. REMOVE  OLD PATCH FIRST 06/24/17  Yes Shelly Coss, MD  ferrous sulfate 325 (65 FE) MG tablet Take 325 mg by mouth daily.    Yes [provider]  HYDROcodone-acetaminophen (NORCO/VICODIN) 5-325 MG tablet Take 1 tablet by mouth 2 (two) times daily as needed for moderate pain.   Yes [provider]  isosorbide dinitrate (ISORDIL) 30 MG tablet Take 30 mg by mouth 4 (four) times daily. HOLD FOR SYSTOLIC B/P <834   Yes [provider]  Lidocaine (ASPERCREME LIDOCAINE) 4 % PTCH Apply 1 application topically daily.   Yes [provider]  Melatonin 3 MG TABS Take 6 mg by mouth at bedtime.    Yes [provider]  Menthol, Topical Analgesic, (BIOFREEZE) 4 % GEL Apply 1 Dose topically every 4 (four) hours as needed (Apply to right side as needed).   Yes [provider]  Multiple Vitamins-Minerals (CERTAVITE/ANTIOXIDANTS) TABS Take 1 tablet by mouth daily.   Yes [provider]  ondansetron (ZOFRAN) 4 MG tablet Take 4 mg by mouth every 4 (four) hours as needed for nausea or vomiting.   Yes [provider]  pantoprazole (PROTONIX) 40 MG tablet Take 40 mg by mouth daily.    Yes [provider]  polyethylene glycol (MIRALAX / GLYCOLAX) packet Take 17 g by mouth daily as needed (FOR CONSTIPATION). MIXED INTO 4-8 OUNCES OF LIQUID   Yes [provider]  predniSONE (DELTASONE) 5 MG tablet Take 4 tablets (20 mg total) by mouth daily with breakfast. 01/04/18  Yes Kathrynn Ducking, MD  pyridostigmine (MESTINON) 60 MG tablet Take 0.5 tablets (30 mg total) by mouth 3 (three) times daily. 03/13/17  Yes Kathrynn Ducking, MD  saccharomyces boulardii (FLORASTOR) 250 MG capsule Take 250 mg by mouth daily.   Yes [provider]  traZODone (DESYREL) 50 MG tablet Take 75 mg by mouth at bedtime.    Yes [provider]  vitamin B-12 (CYANOCOBALAMIN) 100 MCG tablet Take 50 mcg by mouth daily.    Yes [provider]  amLODipine (NORVASC) 5 MG tablet Take 1 tablet (5 mg total) by mouth daily. Patient not taking: Reported on 01/10/2018 06/25/17   Shelly Coss, MD  cloNIDine (CATAPRES) 0.1 MG tablet Take 1 tablet (0.1 mg total) by mouth 3 (three) times daily. Patient not taking: Reported on 12/15/2017 06/24/17   Shelly Coss, MD  furosemide (LASIX) 20 MG tablet Take 1 tablet (20 mg total) by mouth daily. Patient taking differently: Take 20 mg by mouth daily. HOLD FOR SYSTOLIC B/P <196 04/08/95 06/14/17  Strader, Fransisco Hertz, PA-C  NONFORMULARY OR COMPOUNDED ITEM [facility]:  "Authorized Substitute" Pain Cream Formulation - Ibuprofen 15%, Gaclofen 1%, Gabapentin 3%, Lidocaine 2%, apply 2 grams to affected area 3 times daily. Patient not taking: Reported on 01/04/2018 03/16/16   Trula Slade, DPM   Allergies  Allergen Reactions  . Codeine Nausea Only   Review of Systems  Constitutional: Positive for activity change and appetite change.  Gastrointestinal: Positive for abdominal distention, abdominal pain and constipation.  Neurological: Positive for weakness.   Physical Exam  Constitutional: She is cooperative. She appears ill.  HENT:  Head: Normocephalic and atraumatic.  Cardiovascular: Regular rhythm.  Pulmonary/Chest: No accessory muscle usage. No tachypnea. No respiratory distress.  Abdominal: Bowel sounds are absent. There is tenderness.  RN to give prn morphine  Neurological: She is alert.  Oriented to person/place/situation  Skin: Skin is warm and dry.  Psychiatric: She has a normal mood  and affect. Her speech is normal and behavior is normal. Cognition and memory are  normal.  Nursing note and vitals reviewed.  Vital Signs: BP 105/67 (BP Location: Right Arm)   Pulse 90   Temp 98.2 F (36.8 C) (Oral)   Resp 20   Ht 5\' 6"  (1.676 m)   Wt 101.4 kg   SpO2 95%   BMI 36.08 kg/m  Pain Scale: PAINAD POSS *See Group Information*: S-Acceptable,Sleep, easy to arouse Pain Score: Asleep   SpO2: SpO2: 95 % O2 Device:SpO2: 95 % O2 Flow Rate: .   IO: Intake/output summary:   Intake/Output Summary (Last 24 hours) at 01/07/2018 1638 Last data filed at 01/07/2018 0950 Gross per 24 hour  Intake 100 ml  Output -  Net 100 ml    LBM:   Baseline Weight: Weight: 95.3 kg Most recent weight: Weight: 101.4 kg     Palliative Assessment/Data: PPS 20%   Flowsheet Rows     Most Recent Value  Intake Tab  Referral Department  Hospitalist  Unit at Time of Referral  Med/Surg Unit  Palliative Care Primary Diagnosis  -- [Bowel perforation]  Palliative Care Type  New Palliative care  Reason for referral  Clarify Goals of Care, End of Life Care Assistance, Pain, Non-pain Symptom  Date first seen by Palliative Care  01/07/18  Clinical Assessment  Palliative Performance Scale Score  20%  Psychosocial & Spiritual Assessment  Palliative Care Outcomes  Patient/Family meeting held?  Yes  Who was at the meeting?  daughter  Palliative Care Outcomes  Clarified goals of care, Improved pain interventions, Improved non-pain symptom therapy, Counseled regarding hospice, Provided end of life care assistance, Provided psychosocial or spiritual support, ACP counseling assistance      Time In: 1500 Time Out: 1600 Time Total: 47min Greater than 50%  of this time was spent counseling and coordinating care related to the above assessment and plan.  Signed by:  Ihor Dow, FNP-C Palliative Medicine Team  Phone: [phone] Fax: [phone]   Please contact Palliative Medicine Team phone at [phone] for questions and concerns.  For individual provider: See  Shea Evans

## 2018-01-08 DIAGNOSIS — R1084 Generalized abdominal pain: Secondary | ICD-10-CM

## 2018-01-08 MED ORDER — MORPHINE SULFATE (PF) 2 MG/ML IV SOLN
1.0000 mg | Freq: Four times a day (QID) | INTRAVENOUS | Status: DC
  Administered 2018-01-08 – 2018-01-10 (×5): 1 mg via INTRAVENOUS
  Filled 2018-01-08 (×6): qty 1

## 2018-01-08 MED ORDER — ORAL CARE MOUTH RINSE
15.0000 mL | Freq: Two times a day (BID) | OROMUCOSAL | Status: DC
  Administered 2018-01-09 – 2018-01-13 (×6): 15 mL via OROMUCOSAL

## 2018-01-08 MED ORDER — CHLORHEXIDINE GLUCONATE 0.12 % MT SOLN
15.0000 mL | Freq: Two times a day (BID) | OROMUCOSAL | Status: DC
  Administered 2018-01-08 – 2018-01-13 (×9): 15 mL via OROMUCOSAL
  Filled 2018-01-08 (×10): qty 15

## 2018-01-08 NOTE — Progress Notes (Signed)
Daily Progress Note   Patient Name: Jennifer [name]       Date: 01/08/2018 DOB: 07/24/1935  Age: 82 y.o. MRN#: [id number] Attending Physician: Martyn Malay, MD Primary Care Physician: Janie Morning, DO Admit Date: 12/17/2017   Reason for Consultation/Follow-up: Establishing goals of care  Subjective:  Upon arrival to room, patient being repositioned by nursing staff. Appears uncomfortable and complains of abdominal pain. RN to give prn morphine. Patient drowsy and will not answer questions.   GOC:  Granddaughter at bedside. Patient's daughter, Venida Jarvis, has left for the day with plans for other family members to be with [employer]. Discussed plan of care with Sherri via telephone. Discussed current medication regimen and thoughts on scheduling low-dose morphine to stay on top of pain management. Sherri agrees with this plan, again confirming she wishes for her mother to be comfortable. She did have a well rested night with ativan given HS. No complaints of nausea. Answered questions and concern for family. Educated on EOL expectations. Emotional support provided.   Length of Stay: 3  Current Medications: Scheduled Meds:  . acetaminophen  650 mg Oral Once  . cycloSPORINE  1 drop Both Eyes BID  . dorzolamide-timolol  1 drop Both Eyes BID  . LORazepam  0.5 mg Intravenous QHS  . ondansetron (ZOFRAN) IV  4 mg Intravenous BID    Continuous Infusions:  PRN Meds: glycopyrrolate, LORazepam **OR** LORazepam **OR** LORazepam, morphine injection, ondansetron (ZOFRAN) IV  Physical Exam  Constitutional: She is easily aroused. She appears ill.  HENT:  Head: Normocephalic and atraumatic.  Pulmonary/Chest: No accessory muscle usage. No tachypnea. No respiratory distress.  Abdominal: There is  tenderness.  Neurological: She is easily aroused.  drowsy  Skin: Skin is warm and dry.  Psychiatric: Her speech is delayed. Cognition and memory are impaired. She is inattentive.  Nursing note and vitals reviewed.          Vital Signs: BP 93/73 (BP Location: Right Arm)   Pulse 84   Temp (!) 97.2 F (36.2 C) (Oral)   Resp (!) 24   Ht 5\' 6"  (1.676 m)   Wt 101.4 kg   SpO2 96%   BMI 36.08 kg/m  SpO2: SpO2: 96 % O2 Device: O2 Device: Room Air O2 Flow Rate:    Intake/output summary:   Intake/Output  Summary (Last 24 hours) at 01/08/2018 1146 Last data filed at 01/08/2018 1136 Gross per 24 hour  Intake -  Output 500 ml  Net -500 ml   LBM:   Baseline Weight: Weight: 95.3 kg Most recent weight: Weight: 101.4 kg       Palliative Assessment/Data: PPS 20%   Flowsheet Rows     Most Recent Value  Intake Tab  Referral Department  Hospitalist  Unit at Time of Referral  Med/Surg Unit  Palliative Care Primary Diagnosis  -- [Bowel perforation]  Palliative Care Type  New Palliative care  Reason for referral  Clarify Goals of Care, End of Life Care Assistance, Pain, Non-pain Symptom  Date first seen by Palliative Care  01/07/18  Clinical Assessment  Palliative Performance Scale Score  20%  Psychosocial & Spiritual Assessment  Palliative Care Outcomes  Patient/Family meeting held?  Yes  Who was at the meeting?  daughter  Palliative Care Outcomes  Clarified goals of care, Improved pain interventions, Improved non-pain symptom therapy, Counseled regarding hospice, Provided end of life care assistance, Provided psychosocial or spiritual support, ACP counseling assistance      Patient Active Problem List   Diagnosis Date Noted  . Palliative care by specialist   . Terminal aortic occlusion ([place])   . Generalized abdominal pain   . AKI (acute kidney injury) ([place])   . Hypotension   . End of life care   . Perforation bowel ([place]) 01/13/2018  . Pressure injury of skin 06/16/2017  .  Acute encephalopathy 06/15/2017  . Acute metabolic encephalopathy 74/25/9563  . Hyperlipidemia 06/15/2017  . Myasthenia gravis ([place]) 12/09/2016  . Debilitated patient 06/18/2016  . Chest pain at rest 06/16/2016  . LBBB (left bundle branch block)   . Lower extremity edema 01/25/2014  . Patellar tendon rupture 07/12/2013  . Constipation 07/02/2013  . Knee pain 06/28/2013  . Acute posthemorrhagic anemia 06/28/2013  . Senile dementia, uncomplicated ([place]) 87/56/4332  . Right Mechanical complication of knee prosthesis 06/19/2013  . 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 Paget's disease of bone 11/29/2012  . Nausea without vomiting 11/29/2012  . GERD (gastroesophageal reflux disease) 11/29/2012  . Nonischemic cardiomyopathy ([place]) 11/29/2012  . Anemia 11/29/2012  . Depression 11/29/2012  . Abdominal pain, epigastric 11/28/2012  . UTERINE FIBROID 04/13/2006  . Hypothyroidism 04/13/2006  . HYPOPARATHYROIDISM 04/13/2006  . OBESITY, NOS 04/13/2006  . Pernicious anemia 04/13/2006  . Anxiety state 04/13/2006  . DEPRESSIVE DISORDER, NOS 04/13/2006  . GLAUCOMA 04/13/2006  . Essential hypertension 04/13/2006  . GASTROESOPHAGEAL REFLUX, NO ESOPHAGITIS 04/13/2006  . CKD (chronic kidney disease) stage 3, GFR 30-59 ml/min ([facility]) 04/13/2006  . OSTEOARTHRITIS, LOWER LEG 04/13/2006  . ROTATOR CUFF TENDONITIS 04/13/2006  . OSTEOPOROSIS, UNSPECIFIED 04/13/2006  . COSTOCHONDRITIS 04/13/2006  . INSOMNIA NOS 04/13/2006  . PAIN, GENERALIZED 04/13/2006  . INCONTINENCE, URGE 04/13/2006    Palliative Care Assessment & Plan   Patient Profile: 82 y.o. female  with past medical history of myasthenia gravis on chronic prednisone, HTN, CKD, cardiomyopathy, and chronic pain admitted on 01/04/2018 with LLQ abdominal pain. Patient admitted for septic shock secondary to bowel perforation. Surgery was consulted and after detailed discussions with attending and surgery, patient/family made decision  against surgical intervention due to high risk of mortality and with underlying co-morbidities. Decision was made for DNR and comfort measures. Palliative medicine consultation for goals of care/terminal care.   Assessment: Abdominal pain Bowel perforation Septic shock Myasthenia gravis Nausea  Recommendations/Plan:   Comfort measures  only. Interventions not aimed at comfort have been discontinued.   Eye drops continued per request of daughter.  Continue current medication regimen to ensure comfort and dignity. Low-dose Morphine 1mg  IV q6h scheduled for ongoing abdominal pain.   Daughter requesting to continue EOL care inpatient. PMT will follow.   Goals of Care and Additional Recommendations:  Limitations on Scope of Treatment: Full Comfort Care  Code Status: DNR   Code Status Orders  (From admission, onward)         Start     Ordered   12/16/2017 1631  Do not attempt resuscitation (DNR)  Continuous    Question Answer Comment  In the event of cardiac or respiratory ARREST Do not call a "code blue"   In the event of cardiac or respiratory ARREST Do not perform Intubation, CPR, defibrillation or ACLS   In the event of cardiac or respiratory ARREST Use medication by any route, position, wound care, and other measures to relive pain and suffering. May use oxygen, suction and manual treatment of airway obstruction as needed for comfort.   Comments confirmed by daughter HCPOA      01/09/2018 1631        Code Status History    Date Active Date Inactive Code Status Order ID Comments User Context   12/21/2017 1630 01/12/2018 1631 DNR [id number]  Sela Hilding, MD ED   06/15/2017 0321 06/24/2017 1956 Full Code [id number]  Rise Patience, MD ED   06/16/2016 1856 06/18/2016 1721 Full Code [id number]  Isaiah Serge, NP Inpatient   07/12/2013 1448 07/15/2013 1924 Full Code [id number]  [name], PA-C Inpatient   06/19/2013 1818 06/23/2013 1720 Full Code [id number]  [name], PA-C Inpatient   11/28/2012 0916 11/30/2012 2112 Full Code [id number]  Elmarie Shiley, MD Inpatient       Prognosis:   Likely days  Discharge Planning:  To Be Determined  Care plan was discussed with patient, granddaughter, daughter  Thank you for allowing the Palliative Medicine Team to assist in the care of this patient.   Time In: 1130 Time Out: 1155 Total Time 25 Prolonged Time Billed  no      Greater than 50%  of this time was spent counseling and coordinating care related to the above assessment and plan.  Ihor Dow, FNP-C Palliative Medicine Team  Phone: [phone] Fax: [phone]  Please contact Palliative Medicine Team phone at [phone] for questions and concerns.

## 2018-01-08 NOTE — Progress Notes (Signed)
Family Medicine Teaching Service Daily Progress Note Intern Pager: [phone]  Patient name: Jennifer [name] Medical record number: [id number] Date of birth: [birth year] Age: 82 y.o. Gender: female  Primary Care Provider: Janie Morning, DO Consultants: CCM, surgery (signed off) Code Status: DNR/DNI, comfort care  Pt Overview and Major Events to Date:  11/22: Patient admitted, shock secondary to bowel per, comfort care  Assessment and Plan: Comfort care 2/2 bowel perforation, septic shock:   Spoke with palliative care yesterday, discussed further hospice options, daughter is requesting anticipated hospital death. Patient is comfortable, intermittently alert, but not oriented.  -Palliative care involved, appreciate recommendations -DNR/DNI, RN may pronounce -Regular diet if wishes, has not eaten  -no further labs  -daily vitals -Morphine 2mg  q2 hours PRN, titrate as needed  -Ativan 1 mg q4 PRN for anxiety, titrate as needed -Chaplain visited with family, available as needed   Myasthenia gravis:  Holding previous medications due to comfort care status.   Hypertension: Stable  Holding home medications secondary to comfort care status.  GERD: Stable  No symptoms currently, will restart protonix if needed.    FEN/GI: regular diet PPx: none  Disposition: comfort care  Subjective:  Doing well this morning, spoke with daughter at bedside at length. Family is desiring anticipated hospital death, she feels this is the best place for her mother as she is comfortable, she already knows all of the nurses, and they are providing excellent care for her. No further fidgeting since starting the air mattress yesterday. Only taking sips of water, not eating.   Objective: Temp:  [97.2 F (36.2 C)] 97.2 F (36.2 C) (11/25 0512) Pulse Rate:  [84] 84 (11/25 0512) Resp:  [24] 24 (11/25 0512) BP: (93)/(73) 93/73 (11/25 0512) SpO2:  [96 %] 96 % (11/25 0512) Physical Exam: General: Elderly  female, resting comfortably, no acute distress HEENT: NCAT, MM dry Cardiac: RRR no m/g/r Lungs: Clear bilaterally, no increased WOB, slightly shallow  Abdomen: soft, non-tender, non-distended Ext: Warm, dry, peripheral pulses palpable  Neuro: Able to open eyes upon verbal stimulation, not fully alert.    Laboratory: Recent Labs  Lab 01/04/18 1016 01/09/2018 0858  WBC 8.0 9.5  HGB 10.8* 10.2*  HCT 31.3* 32.2*  PLT 249 192   Recent Labs  Lab 01/04/18 1016 12/16/2017 0858  NA 140 139  K 4.0 3.7  CL 102 106  CO2 21 22  BUN 59* 66*  CREATININE 3.74* 4.55*  CALCIUM 9.2 8.7*  PROT 5.4* 5.2*  BILITOT 0.6 1.1  ALKPHOS 97 88  ALT 17 19  AST 21 21  GLUCOSE 73 60*    Imaging/Diagnostic Tests:  FINDINGS: Lower chest: Small bilateral pleural effusions. No infiltrate. Mild basilar atelectasis.  Hepatobiliary: No focal abnormality on noncontrast exam. Postcholecystectomy.  Pancreas: Pancreas is normal. No ductal dilatation. No pancreatic inflammation.  Spleen: Normal spleen  Adrenals/urinary tract: Adrenal glands are normal. Post RIGHT nephrectomy. No hydronephrosis of the LEFT kidney. LEFT ureter and bladder normal.  Stomach/Bowel: Large volume intraperitoneal free air collecting non dependently within the peritoneal space and extending beneath the hemidiaphragms and extending along the LEFT pericolic gutter and splenic flexure of the colon.  There is no clear perforation site identified however the predominance of gas surrounding distal transverse colon to the splenic flexure is concerning for site of perforation in the transverse or LEFT colon. Gas around the LEFT pararenal space also suggesting a LEFT colon rupture.  Oral contrast was administered. There is no evidence of leak of the  oral contrast. No fluid collections in the abdomen pelvis.  Esophagus and stomach appear normal. Duodenum normal. Small bowel normal caliber. Contrast flows in the distal  small bowel. Appendix and cecum normal. Ascending colon normal. Transverse colon is difficult to follow as it is surrounded extraluminal gas colon is relatively collapsed. There are diverticula of the descending colon. No acute inflammation identified. Diverticula sigmoid colon rectum.  Vascular/Lymphatic: Abdominal aorta is normal caliber with atherosclerotic calcification. There is no retroperitoneal or periportal lymphadenopathy. No pelvic lymphadenopathy.  Reproductive: Uterus normal. Calcified leiomyoma extending from uterine body. Ovaries normal  Other: Large volume intraperitoneal free air described above.  Musculoskeletal: A thickened trabecula and lytic process in the sacrum is not changed from prior. Findings suggest chronic infection or inflammation such as Paget's disease.  IMPRESSION: 1. Large volume intraperitoneal free air consistent with perforated bowel. Favor transverse colon or LEFT colon as source of perforation as the gas is concentrated along this portion the colon however there is extensive gas throughout the abdomen which makes localization difficult. 2. Diverticulosis of the LEFT colon without clear evidence diverticulitis. 3. No spillage of oral contrast proximally to suggests duodenal rupture. No intraperitoneal free fluid. 4. Normal  solitary LEFT kidney on noncontrast exam.  Patriciaann Clan, DO 01/08/2018, 6:51 AM PGY-1, [place] Intern pager: [phone], text pages welcome

## 2018-01-09 DIAGNOSIS — R400 Somnolence: Secondary | ICD-10-CM

## 2018-01-09 NOTE — Progress Notes (Signed)
Family Medicine Teaching Service Daily Progress Note Intern Pager: [phone]  Patient name: Jennifer [name] Medical record number: [id number] Date of birth: 11/03/1935 Age: 82 y.o. Gender: female  Primary Care Provider: Janie Morning, DO Consultants: CCM, surgery (signed off) Code Status: DNR/DNI, comfort care  Pt Overview and Major Events to Date:  11/22: Patient admitted, shock secondary to bowel per, comfort care.   Assessment and Plan: Comfort care 2/2 bowel perforation, septic shock:   Anticipated hospital death, patient is comfortable.  Palliative care is following. - Appreciate palliative care recommendations -DNR/DNI, RR and may pronounce -Regular diet if wishes, has not eaten -No further labs -Daily vitals -Morphine 1 mg every 6 scheduled, 2-4 mg every 2 as needed - Ativan 1 mg every 4 as needed for anxiety, titrate as needed - Chaplain continue to follow, available as needed  Myasthenia gravis:  Holding previous medications due to comfort care status.   Hypertension: Stable  Holding home medications secondary to comfort care status.  GERD: Stable  No symptoms currently, will restart protonix if needed.    FEN/GI: regular diet PPx: none  Disposition: comfort care  Subjective:  Resting comfortably this morning, mumbling incoherently. No family at bedside currently.  Objective: Temp:  [99.1 F (37.3 C)] 99.1 F (37.3 C) (11/26 0554) Pulse Rate:  [90] 90 (11/26 0554) Resp:  [16] 16 (11/26 0554) BP: (82)/(50) 82/50 (11/26 0554) SpO2:  [97 %] 97 % (11/26 0554) Physical Exam: General: Elderly female, resting comfortably, no acute distress HEENT: NCAT, MM dry Cardiac: RRR no m/g/r Lungs: Clear bilaterally, no increased WOB  Abdomen: soft, tender to palpation by patient pushing my hands off of her abdomen, distended, normoactive BS  Ext: Warm, dry, palpable distal pulses, no edema  Neuro: Not opening eyes this morning to verbal stimulation, however mumbling.    Laboratory Recent Labs  Lab 01/04/18 1016 01/10/2018 0858  WBC 8.0 9.5  HGB 10.8* 10.2*  HCT 31.3* 32.2*  PLT 249 192   Recent Labs  Lab 01/04/18 1016 01/02/2018 0858  NA 140 139  K 4.0 3.7  CL 102 106  CO2 21 22  BUN 59* 66*  CREATININE 3.74* 4.55*  CALCIUM 9.2 8.7*  PROT 5.4* 5.2*  BILITOT 0.6 1.1  ALKPHOS 97 88  ALT 17 19  AST 21 21  GLUCOSE 73 60*    Imaging/Diagnostic Tests:  FINDINGS: Lower chest: Small bilateral pleural effusions. No infiltrate. Mild basilar atelectasis.  Hepatobiliary: No focal abnormality on noncontrast exam. Postcholecystectomy.  Pancreas: Pancreas is normal. No ductal dilatation. No pancreatic inflammation.  Spleen: Normal spleen  Adrenals/urinary tract: Adrenal glands are normal. Post RIGHT nephrectomy. No hydronephrosis of the LEFT kidney. LEFT ureter and bladder normal.  Stomach/Bowel: Large volume intraperitoneal free air collecting non dependently within the peritoneal space and extending beneath the hemidiaphragms and extending along the LEFT pericolic gutter and splenic flexure of the colon.  There is no clear perforation site identified however the predominance of gas surrounding distal transverse colon to the splenic flexure is concerning for site of perforation in the transverse or LEFT colon. Gas around the LEFT pararenal space also suggesting a LEFT colon rupture.  Oral contrast was administered. There is no evidence of leak of the oral contrast. No fluid collections in the abdomen pelvis.  Esophagus and stomach appear normal. Duodenum normal. Small bowel normal caliber. Contrast flows in the distal small bowel. Appendix and cecum normal. Ascending colon normal. Transverse colon is difficult to follow as it is surrounded extraluminal  gas colon is relatively collapsed. There are diverticula of the descending colon. No acute inflammation identified. Diverticula sigmoid colon rectum.  Vascular/Lymphatic:  Abdominal aorta is normal caliber with atherosclerotic calcification. There is no retroperitoneal or periportal lymphadenopathy. No pelvic lymphadenopathy.  Reproductive: Uterus normal. Calcified leiomyoma extending from uterine body. Ovaries normal  Other: Large volume intraperitoneal free air described above.  Musculoskeletal: A thickened trabecula and lytic process in the sacrum is not changed from prior. Findings suggest chronic infection or inflammation such as Paget's disease.  IMPRESSION: 1. Large volume intraperitoneal free air consistent with perforated bowel. Favor transverse colon or LEFT colon as source of perforation as the gas is concentrated along this portion the colon however there is extensive gas throughout the abdomen which makes localization difficult. 2. Diverticulosis of the LEFT colon without clear evidence diverticulitis. 3. No spillage of oral contrast proximally to suggests duodenal rupture. No intraperitoneal free fluid. 4. Normal  solitary LEFT kidney on noncontrast exam.  Patriciaann Clan, DO 01/09/2018, 8:55 AM PGY-1, [place] Intern pager: [phone], text pages welcome

## 2018-01-09 NOTE — Progress Notes (Signed)
   01/09/18 1100  Clinical Encounter Type  Visited With Patient  Visit Type Initial   Made visit while rounding on unit and saw that pt was EOL.  Spoke to pt at bedside although she did not open her eyes or make other obvious response.  Chaplain remains available to support pt and family.  Myra Gianotti resident, [phone]

## 2018-01-09 NOTE — Progress Notes (Signed)
Daily Progress Note   Patient Name: Jennifer [name]       Date: 01/09/2018 DOB: 1936/02/09  Age: 82 y.o. MRN#: [id number] Attending Physician: Martyn Malay, MD Primary Care Physician: Janie Morning, DO Admit Date: 12/16/2017   Reason for Consultation/Follow-up: Establishing goals of care  Subjective:  Patient lethargic but appears comfortable with no s/s of distress. Regular, shallow respirations. Soft blood pressure.  No family at bedside this morning. Family has PMT contact information and knows they can call with questions or concerns.   Length of Stay: 4  Current Medications: Scheduled Meds:  . acetaminophen  650 mg Oral Once  . chlorhexidine  15 mL Mouth Rinse BID  . cycloSPORINE  1 drop Both Eyes BID  . dorzolamide-timolol  1 drop Both Eyes BID  . LORazepam  0.5 mg Intravenous QHS  . mouth rinse  15 mL Mouth Rinse q12n4p  .  morphine injection  1 mg Intravenous Q6H  . ondansetron (ZOFRAN) IV  4 mg Intravenous BID    Continuous Infusions:  PRN Meds: glycopyrrolate, LORazepam **OR** LORazepam **OR** LORazepam, morphine injection, ondansetron (ZOFRAN) IV  Physical Exam  Constitutional: She appears lethargic. She appears ill.  HENT:  Head: Normocephalic and atraumatic.  Pulmonary/Chest: No accessory muscle usage. No tachypnea. No respiratory distress.  Regular, shallow respirations  Abdominal: There is no tenderness.  Neurological: She appears lethargic.  Skin: Skin is warm and dry.  Psychiatric: Her speech is delayed. Cognition and memory are impaired. She is inattentive.  Nursing note and vitals reviewed.          Vital Signs: BP (!) 82/50 (BP Location: Right Arm)   Pulse 90   Temp 99.1 F (37.3 C) (Oral)   Resp 16   Ht 5\' 6"  (1.676 m)   Wt 101.4 kg   SpO2  97%   BMI 36.08 kg/m  SpO2: SpO2: 97 % O2 Device: O2 Device: Room Air O2 Flow Rate:    Intake/output summary:   Intake/Output Summary (Last 24 hours) at 01/09/2018 0940 Last data filed at 01/09/2018 0338 Gross per 24 hour  Intake -  Output 425 ml  Net -425 ml   LBM:   Baseline Weight: Weight: 95.3 kg Most recent weight: Weight: 101.4 kg       Palliative Assessment/Data: PPS 10%   Flowsheet  Rows     Most Recent Value  Intake Tab  Referral Department  Hospitalist  Unit at Time of Referral  Med/Surg Unit  Palliative Care Primary Diagnosis  -- [Bowel perforation]  Date Notified  01/06/18  Palliative Care Type  New Palliative care  Reason for referral  Clarify Goals of Care, End of Life Care Assistance, Pain, Non-pain Symptom  Date of Admission  01/07/18  Date first seen by Palliative Care  01/07/18  # of days Palliative referral response time  1 Day(s)  # of days IP prior to Palliative referral  -1  Clinical Assessment  Palliative Performance Scale Score  10%  Psychosocial & Spiritual Assessment  Palliative Care Outcomes  Patient/Family meeting held?  Yes  Who was at the meeting?  daughter  Palliative Care Outcomes  Clarified goals of care, Improved pain interventions, Improved non-pain symptom therapy, Counseled regarding hospice, Provided end of life care assistance, Provided psychosocial or spiritual support, ACP counseling assistance      Patient Active Problem List   Diagnosis Date Noted  . Palliative care by specialist   . Terminal aortic occlusion ([place])   . Generalized abdominal pain   . AKI (acute kidney injury) ([place])   . Hypotension   . Terminal care   . Perforation bowel ([place]) 12/30/2017  . Pressure injury of skin 06/16/2017  . Acute encephalopathy 06/15/2017  . Acute metabolic encephalopathy 41/96/2229  . Hyperlipidemia 06/15/2017  . Myasthenia gravis ([place]) 12/09/2016  . Debilitated patient 06/18/2016  . Chest pain at rest 06/16/2016  . LBBB  (left bundle branch block)   . Lower extremity edema 01/25/2014  . Patellar tendon rupture 07/12/2013  . Constipation 07/02/2013  . Knee pain 06/28/2013  . Acute posthemorrhagic anemia 06/28/2013  . Senile dementia, uncomplicated ([place]) 79/89/2119  . Right Mechanical complication of knee prosthesis 06/19/2013  . 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 Paget's disease of bone 11/29/2012  . Nausea without vomiting 11/29/2012  . GERD (gastroesophageal reflux disease) 11/29/2012  . Nonischemic cardiomyopathy ([place]) 11/29/2012  . Anemia 11/29/2012  . Depression 11/29/2012  . Abdominal pain, epigastric 11/28/2012  . UTERINE FIBROID 04/13/2006  . Hypothyroidism 04/13/2006  . HYPOPARATHYROIDISM 04/13/2006  . OBESITY, NOS 04/13/2006  . Pernicious anemia 04/13/2006  . Anxiety state 04/13/2006  . DEPRESSIVE DISORDER, NOS 04/13/2006  . GLAUCOMA 04/13/2006  . Essential hypertension 04/13/2006  . GASTROESOPHAGEAL REFLUX, NO ESOPHAGITIS 04/13/2006  . CKD (chronic kidney disease) stage 3, GFR 30-59 ml/min ([facility]) 04/13/2006  . OSTEOARTHRITIS, LOWER LEG 04/13/2006  . ROTATOR CUFF TENDONITIS 04/13/2006  . OSTEOPOROSIS, UNSPECIFIED 04/13/2006  . COSTOCHONDRITIS 04/13/2006  . INSOMNIA NOS 04/13/2006  . PAIN, GENERALIZED 04/13/2006  . INCONTINENCE, URGE 04/13/2006    Palliative Care Assessment & Plan   Patient Profile: 82 y.o. female  with past medical history of myasthenia gravis on chronic prednisone, HTN, CKD, cardiomyopathy, and chronic pain admitted on 12/27/2017 with LLQ abdominal pain. Patient admitted for septic shock secondary to bowel perforation. Surgery was consulted and after detailed discussions with attending and surgery, patient/family made decision against surgical intervention due to high risk of mortality and with underlying co-morbidities. Decision was made for DNR and comfort measures. Palliative medicine consultation for goals of care/terminal care.    Assessment: Abdominal pain Bowel perforation Septic shock Myasthenia gravis Nausea  Recommendations/Plan:   Comfort measures only. Interventions not aimed at comfort have been discontinued.   Eye drops continued per request of daughter.  Continue current medication regimen to ensure comfort  and dignity. Low-dose Morphine 1mg  IV q6h scheduled for ongoing abdominal pain.   Daughter requesting to continue EOL care inpatient. PMT will follow.   Goals of Care and Additional Recommendations:  Limitations on Scope of Treatment: Full Comfort Care  Code Status: DNR   Code Status Orders  (From admission, onward)         Start     Ordered   01/02/2018 1631  Do not attempt resuscitation (DNR)  Continuous    Question Answer Comment  In the event of cardiac or respiratory ARREST Do not call a "code blue"   In the event of cardiac or respiratory ARREST Do not perform Intubation, CPR, defibrillation or ACLS   In the event of cardiac or respiratory ARREST Use medication by any route, position, wound care, and other measures to relive pain and suffering. May use oxygen, suction and manual treatment of airway obstruction as needed for comfort.   Comments confirmed by daughter HCPOA      12/17/2017 1631        Code Status History    Date Active Date Inactive Code Status Order ID Comments User Context   12/21/2017 1630 12/29/2017 1631 DNR [id number]  Sela Hilding, MD ED   06/15/2017 0321 06/24/2017 1956 Full Code [id number]  Rise Patience, MD ED   06/16/2016 1856 06/18/2016 1721 Full Code [id number]  Isaiah Serge, NP Inpatient   07/12/2013 1448 07/15/2013 1924 Full Code [id number]  [name], PA-C Inpatient   06/19/2013 1818 06/23/2013 1720 Full Code [id number]  [name], PA-C Inpatient   11/28/2012 0916 11/30/2012 2112 Full Code [id number]  Elmarie Shiley, MD Inpatient       Prognosis:   Likely days  Discharge Planning:  To Be Determined  Care plan was  discussed with patient, granddaughter, daughter  Thank you for allowing the Palliative Medicine Team to assist in the care of this patient.   Time In: 0925 Time Out: 0940 Total Time 15 Prolonged Time Billed  no      Greater than 50%  of this time was spent counseling and coordinating care related to the above assessment and plan.  Ihor Dow, FNP-C Palliative Medicine Team  Phone: [phone] Fax: [phone]  Please contact Palliative Medicine Team phone at [phone] for questions and concerns.

## 2018-01-09 NOTE — Social Work (Signed)
CSW acknowledging pt from a facility but is currently comfort care. Will follow for disposition should hospice services or residential hospice become appropriate.   Alexander Mt, [place] Work [phone]

## 2018-01-10 MED ORDER — HYDROMORPHONE HCL 1 MG/ML IJ SOLN
0.5000 mg | Freq: Four times a day (QID) | INTRAMUSCULAR | Status: DC
  Administered 2018-01-10 – 2018-01-11 (×3): 0.5 mg via INTRAVENOUS
  Filled 2018-01-10 (×3): qty 1

## 2018-01-10 MED ORDER — HYDROMORPHONE HCL 1 MG/ML IJ SOLN: 0.5000 mg | INTRAMUSCULAR | Status: DC | PRN

## 2018-01-10 MED ORDER — HYDROMORPHONE HCL 1 MG/ML IJ SOLN
0.5000 mg | INTRAMUSCULAR | Status: DC | PRN
  Administered 2018-01-11: 1 mg via INTRAVENOUS
  Filled 2018-01-10: qty 1

## 2018-01-10 NOTE — Progress Notes (Signed)
Palliative Medicine RN Note: Daily symptom check. Discussed at length with PMT DNP Ihor Dow.  Patient is able to answer "yes" when asked about being in pain. She is moving and moaning. Family has been asking RN to hold overnight pain meds unless she is in pain, as the goal is (reasonably) to have her be as comfortable as possible while preserving her ability to interact. She withdraws when I touch her.   RN Joaquim Lai brought prn Ativan SL and morphine IV. Even after administration of those medications, Jennifer [name]'s PAINAD remained around a 5, but she no longer answered questions.   She has extreme, unbearable pain with any movement, even shortly after administration of IV opioids. Due to abdominal pain, she would likely be completely intolerant of transportation out of the hospital (she would have to be strapped to the gurney), and her family is unwilling to risk death in an ambulance. Patient's family reports that her doctors have said that she does not have to leave [facility], and they are adamant that they want her to stay here.  Morphine was changed to hydromorphone; creatinine is over 4, and morphine is contraindicated with renal impairment.  PMT will follow for symptom check tomorrow, and I will see her again Friday if she is still alive.  Jennifer Skiff Golden Emile, RN, BSN, [facility] Palliative Medicine Team 01/10/2018 3:34 PM Office [phone]

## 2018-01-10 NOTE — Plan of Care (Signed)
  Problem: Clinical Measurements: Goal: Ability to maintain clinical measurements within normal limits will improve Outcome: Not Progressing Goal: Respiratory complications will improve Outcome: Not Progressing   Problem: Activity: Goal: Risk for activity intolerance will decrease Outcome: Not Progressing

## 2018-01-10 NOTE — Progress Notes (Addendum)
Family Medicine Teaching Service Daily Progress Note Intern Pager: [phone]  Patient name: Jennifer [name] Medical record number: [id number] Date of birth: 1935-03-13 Age: 82 y.o. Gender: female  Primary Care Provider: Janie Morning, DO Consultants: CCM, surgery (signed off) Code Status: DNR/DNI, comfort care  Pt Overview and Major Events to Date:  11/22: Patient admitted, shock secondary to bowel per, comfort care.   Assessment and Plan: Comfort care 2/2 bowel perforation, septic shock:   Anticipated hospital death, patient is comfortable.  Palliative care is following -Appreciate palliative care recommendations -DNR/DNI, RN may pronounce -Regular diet if wishes has not eaten much if any - Daily vitals, no labs -Morphine 1 mg every 6 scheduled, 2-4 mg every 2 as needed - Ativan 1 mg every 4 as needed for anxiety may titrate as needed -Chaplain available as needed  Myasthenia gravis:  Holding previous medications due to comfort care status.   Hypertension: Stable  Holding home medications secondary to comfort care status.  GERD: Stable  No symptoms currently, will restart protonix if needed.    FEN/GI: regular diet PPx: none  Disposition: comfort care  Subjective:  Doing well this morning, daughter at bedside.  States she feels that her mother is comfortable, had some hot flashes yesterday.  Patient responded yes when asked if she is comfortable this morning.   Objective: Temp:  [98.4 F (36.9 C)] 98.4 F (36.9 C) (11/27 0617) Pulse Rate:  [98] 98 (11/27 0617) BP: (118)/(65) 118/65 (11/27 0617) SpO2:  [91 %] 91 % (11/27 0617) Physical Exam: General: Elderly female, resting comfortably, no acute distress HEENT: NCAT, MM dry Cardiac: RRR no m/g/r Lungs: Clear bilaterally, no increased WOB  Abdomen: soft, tender to palpation, distended, no-hypoactive BS Ext: Warm, dry, 2+ distal pulses, no edema  Neuro: Opening eyes minimally this morning to verbal stimulation,  mumbling some  Laboratory Recent Labs  Lab 01/04/18 1016 01/03/2018 0858  WBC 8.0 9.5  HGB 10.8* 10.2*  HCT 31.3* 32.2*  PLT 249 192   Recent Labs  Lab 01/04/18 1016 01/13/2018 0858  NA 140 139  K 4.0 3.7  CL 102 106  CO2 21 22  BUN 59* 66*  CREATININE 3.74* 4.55*  CALCIUM 9.2 8.7*  PROT 5.4* 5.2*  BILITOT 0.6 1.1  ALKPHOS 97 88  ALT 17 19  AST 21 21  GLUCOSE 73 60*    Imaging/Diagnostic Tests:  FINDINGS: Lower chest: Small bilateral pleural effusions. No infiltrate. Mild basilar atelectasis.  Hepatobiliary: No focal abnormality on noncontrast exam. Postcholecystectomy.  Pancreas: Pancreas is normal. No ductal dilatation. No pancreatic inflammation.  Spleen: Normal spleen  Adrenals/urinary tract: Adrenal glands are normal. Post RIGHT nephrectomy. No hydronephrosis of the LEFT kidney. LEFT ureter and bladder normal.  Stomach/Bowel: Large volume intraperitoneal free air collecting non dependently within the peritoneal space and extending beneath the hemidiaphragms and extending along the LEFT pericolic gutter and splenic flexure of the colon.  There is no clear perforation site identified however the predominance of gas surrounding distal transverse colon to the splenic flexure is concerning for site of perforation in the transverse or LEFT colon. Gas around the LEFT pararenal space also suggesting a LEFT colon rupture.  Oral contrast was administered. There is no evidence of leak of the oral contrast. No fluid collections in the abdomen pelvis.  Esophagus and stomach appear normal. Duodenum normal. Small bowel normal caliber. Contrast flows in the distal small bowel. Appendix and cecum normal. Ascending colon normal. Transverse colon is difficult to follow as  it is surrounded extraluminal gas colon is relatively collapsed. There are diverticula of the descending colon. No acute inflammation identified. Diverticula sigmoid colon  rectum.  Vascular/Lymphatic: Abdominal aorta is normal caliber with atherosclerotic calcification. There is no retroperitoneal or periportal lymphadenopathy. No pelvic lymphadenopathy.  Reproductive: Uterus normal. Calcified leiomyoma extending from uterine body. Ovaries normal  Other: Large volume intraperitoneal free air described above.  Musculoskeletal: A thickened trabecula and lytic process in the sacrum is not changed from prior. Findings suggest chronic infection or inflammation such as Paget's disease.  IMPRESSION: 1. Large volume intraperitoneal free air consistent with perforated bowel. Favor transverse colon or LEFT colon as source of perforation as the gas is concentrated along this portion the colon however there is extensive gas throughout the abdomen which makes localization difficult. 2. Diverticulosis of the LEFT colon without clear evidence diverticulitis. 3. No spillage of oral contrast proximally to suggests duodenal rupture. No intraperitoneal free fluid. 4. Normal  solitary LEFT kidney on noncontrast exam.  Patriciaann Clan, DO 01/10/2018, 9:27 AM PGY-1, [place] Intern pager: [phone], text pages welcome

## 2018-01-10 NOTE — Care Management Important Message (Signed)
Important Message  Patient Details  Name: Jennifer [name] MRN: [id number] Date of Birth: March 05, 1935   Medicare Important Message Given:  No  Patient at end of life out of respect no IM given  Orbie Pyo 01/10/2018, 1:14 PM

## 2018-01-11 MED ORDER — HYDROMORPHONE HCL 1 MG/ML IJ SOLN
0.5000 mg | INTRAMUSCULAR | Status: DC | PRN
  Administered 2018-01-11: 1 mg via INTRAVENOUS
  Filled 2018-01-11: qty 1

## 2018-01-11 MED ORDER — HYDROMORPHONE HCL 1 MG/ML IJ SOLN
0.5000 mg | INTRAMUSCULAR | Status: DC
  Administered 2018-01-11 – 2018-01-12 (×6): 0.5 mg via INTRAVENOUS
  Filled 2018-01-11 (×6): qty 1

## 2018-01-11 MED ORDER — LORAZEPAM 2 MG/ML IJ SOLN
0.5000 mg | Freq: Two times a day (BID) | INTRAMUSCULAR | Status: DC
  Administered 2018-01-11 – 2018-01-12 (×2): 0.5 mg via INTRAVENOUS
  Filled 2018-01-11 (×2): qty 1

## 2018-01-11 NOTE — Progress Notes (Addendum)
Family Medicine Teaching Service Daily Progress Note Intern Pager: [phone]  Patient name: Jennifer [name] Medical record number: [id number] Date of birth: [birth year] Age: 82 y.o. Gender: female  Primary Care Provider: Janie Morning, DO Consultants: Palliative   Code Status: DNR/DNI, Comfort Care  Pt Overview and Major Events to Date:  Admitted: 01/12/2018  Hospital Day: 7    Assessment and Plan: Jennifer [name] is a 82 y.o. female who presented with bower perforation and septic shock. She is currently comfort care.   #Comfort Care Anticipated hospital death. Paliative following along.   DNR/DNI, RN may pronounce   No labs nor vitals  Morphine 1mg  q6hours schedule. 2-4mg  q 2 hours PRN  If difficulty with access, can consider other transdermal, sublingual, rectal, etc.  Please page physician if needed  Ativan 1mg  q 4 hours PRN anxiety, may titrate as needed   Chaplain available as needed   Holding previous medications for Myasthenia gravis, HTN, and GERD.   Can restart PPI with GERD discomfort   Cancelling diet as per daughter's request.  Nutrition: No diet requested  Disposition: Anticipated hospital death, comfort care.  Subjective  No acute events over night. Daughter is at bedside this morning and is planning to leave the hospital to be with family for a few hours today. She hopes that staff will be able to check in on patient when she is gone.  Objective:   Vital Signs No new vital signs Physical Exam  Gen: NAD, lying in bed comfortably. No agonal breathing. Skin: Warm and dry HEENT: NCAT.  Dry membranes.   Laboratory: No new labs  Imaging/Diagnostic Tests: No new studies  Wilber Oliphant, MD 01/11/2018, 7:30 AM PGY-1, [place] Intern pager: [phone], text pages welcome

## 2018-01-11 NOTE — Progress Notes (Signed)
Visited with patient and daughter at bedside.  Patient crying out while receiving a gentle bath.   Daughter cries and states "I can't sleep until she sleeps".  Dtr cries and states she is getting such wonderful care here please don't send her to hospice.  I attempted to explain the hospice environment - but dtr continued to insist that she stay here.  Patient lethargic, but crying out when moved or touched.  CV difficult to hear but no m/r/g, CV no distress no frank w/c/r.    Will increase frequency of scheduled pain medication to q4 hours with PRN available.  Will increase frequency of anti anxiety medication to BID.    Prognosis:  Hours to days.  Patient no longer eating or drinking.  Lethargic.  Recommendation:  Continue comfort care.  PMT will continue to round daily to assist with symptom management.  Florentina Jenny, PA-C Palliative Medicine Pager: [phone]  TIme 15 min.

## 2018-01-12 MED ORDER — HYDROMORPHONE HCL 1 MG/ML IJ SOLN: 1.0000 mg | INTRAMUSCULAR | Status: DC | PRN

## 2018-01-12 MED ORDER — OXYCODONE HCL 20 MG/ML PO CONC
10.0000 mg | ORAL | Status: DC | PRN
  Administered 2018-01-12: 10 mg via SUBLINGUAL
  Filled 2018-01-12 (×2): qty 1

## 2018-01-12 MED ORDER — SODIUM CHLORIDE 0.9 % IV SOLN
0.5000 mg/h | INTRAVENOUS | Status: DC
  Filled 2018-01-12: qty 2.5

## 2018-01-12 MED ORDER — SODIUM CHLORIDE 0.9 % IV SOLN
0.5000 mg/h | INTRAVENOUS | Status: DC
  Administered 2018-01-12: 0.5 mg/h via INTRAVENOUS
  Filled 2018-01-12: qty 5

## 2018-01-12 MED ORDER — LORAZEPAM 2 MG/ML PO CONC
1.0000 mg | Freq: Two times a day (BID) | ORAL | Status: DC
  Administered 2018-01-12 – 2018-01-13 (×3): 1 mg via ORAL
  Filled 2018-01-12 (×3): qty 1

## 2018-01-12 MED ORDER — LORAZEPAM 2 MG/ML PO CONC
1.0000 mg | Freq: Four times a day (QID) | ORAL | Status: DC | PRN
  Administered 2018-01-12: 1 mg via ORAL
  Filled 2018-01-12: qty 1

## 2018-01-12 MED ORDER — HYDROMORPHONE BOLUS VIA INFUSION
0.2000 mg | INTRAVENOUS | Status: DC | PRN
  Filled 2018-01-12: qty 1

## 2018-01-12 MED ORDER — HYDROMORPHONE HCL 1 MG/ML IJ SOLN: 0.5000 mg | INTRAMUSCULAR | Status: DC | PRN

## 2018-01-12 MED ORDER — OXYCODONE HCL 20 MG/ML PO CONC: 10.0000 mg | ORAL | Status: DC

## 2018-01-12 MED ORDER — ONDANSETRON 4 MG PO TBDP: 4.0000 mg | ORAL_TABLET | Freq: Three times a day (TID) | ORAL | Status: DC | PRN

## 2018-01-12 MED ORDER — LORAZEPAM 2 MG/ML IJ SOLN: 0.5000 mg | Freq: Three times a day (TID) | INTRAMUSCULAR | Status: DC

## 2018-01-12 NOTE — Progress Notes (Signed)
Family Medicine Teaching Service Daily Progress Note Intern Pager: [phone]  Patient name: Charlyne Robertshaw Venson Medical record number: [id number] Date of birth: 1935-04-20 Age: 82 y.o. Gender: female  Primary Care Provider: Janie Morning, DO Consultants: Palliative   Code Status: DNR/DNI, Comfort Care  Pt Overview and Major Events to Date:  Admitted: 01/12/2018  Hospital Day: 8   Assessment and Plan: OFELIA PODOLSKI is a 82 y.o. female who presented with non-operatable bowel perforation. Currently comfort care.   #Comfort Care Anticipated hospital death.  Appreciate palliative recommendations   Dilaudid increased to q4 hours + PRN   Increase freq of ativan  Patient no longer eating, no diet  Disposition: Comfort Care  Subjective  No acute events over night.   Objective:   Vital Signs No new vitals  Physical Exam  Gen: NAD, lying in bed comfortably sleeping. No agonal breathing.  HEENT: Dry mucous membranes.    Laboratory: No new labs  Imaging/Diagnostic Tests: No new studies    Wilber Oliphant, MD 01/12/2018, 7:57 AM PGY-1, [place] Intern pager: [phone], text pages welcome

## 2018-01-12 NOTE — Progress Notes (Addendum)
No charge note.  Discussed with PMT RN.  Patient has lost IV access.  IV medications including dilaudid, ativan, zofran were changed to sub lingual and sub q.  Roxicodone intensol was added on a scheduled basis as patient's symptoms of pain and distress were of the level requiring a continuous infusion.  Talked with bedside RN to ensure she was comfortable with new orders.  She has my cell number to call me if changes are needed.   Florentina Jenny, PA-C Palliative Medicine Pager: [phone]

## 2018-01-12 NOTE — Progress Notes (Signed)
Palliative Medicine RN Note: Symptom check. Discussed patient with PMT PA Florentina Jenny.   Upon my arrival, pt's daughter and nephew are in the room. Patient is visibly in distress, moaning, rocking back and forth, grimacing, breathing hard, guarding, withdrawing to touch. Daughter requests time alone for nephew to pray and say good bye. I will ask RN Jiles Garter to give prn hydromorphone and lorazepam.  I spent about 45 minutes with Wallis Bamberg daughter. I expressed concern that we weren't keeping on top of her pain. Sherri began to panic and asked, "You're not going to try to tell me she has to go to hospice? She's staying here! You can't move her!", and she started to cry. I explained that PMT does not feel that she can tolerate a move to hospice and that I am more concerned that she needs more aggressive pain management.   We discussed initiating a slow hydromorphone drip. Sherri is agreeable to this. Maritta has IV access, but it is positional and at very high risk to be lost soon. Jiles Garter has requested IV team to come start a new one. Sherri expressed concern about how hard it was to start the IV she has and doesn't want the patient to be stuck a lot.  Myrical did calm down after her lorazepam and hydromorphone, so Haynes Dage ordered a continuous infusion of hydromorphone and increased the frequency of the scheduled Ativan. RN Jiles Garter has requested chaplain come visit again. Evalette's respirations were shallow and even after 30 minutes, and she was no longer moaning. She still withdraws to all touch.  I stayed with [facility] for about 45 minutes. She is having significant & appropriate emotional distress, and she cries easily. She reports that she has "nerve pills" her MD gave her, and I encouraged her to take her medications as prescribed by her MD. We also discussed faith at length, her feeling that it is her responsibilty to take care of her mother now (thus shielding her brothers and children), and  her concerns about her family's health.  PMT does NOT support a move to hospice. Sheelah continues to have significant pain and would not tolerate an ambulance ride where she would be strapped down (she began to thrash and moan when we even looked at her arms for IV sites), and her respiratory status when she is comfortable is very fragile. Further, her family has significant distress associated with the idea of moving to hospice, and Venida Jarvis "will fight" if we try to move [place]. She correctly feels her mother may not survive the transport.  Plan for PMT to continue daily symptom checks.   Marjie Skiff Anibal Quinby, RN, BSN, [facility] Palliative Medicine Team 01/12/2018 3:15 PM Office [phone]

## 2018-01-12 NOTE — Progress Notes (Signed)
Discussed with IV team, pharmacy and bedside RN.  IV team will place a Sub Q IV.  There is no option for a Sub Q infusion in Epic.  After discussing with pharmacy I Will place an order for IV infusion of Dilaudid and then write Sub Q in the administration comments.  Dose of dilaudid should be the same Sub q as IV or perhaps a little higher as the infusion will be slower.  Florentina Jenny, PA-C Palliative Medicine Pager: [phone]

## 2018-01-13 MED ORDER — SCOPOLAMINE 1 MG/3DAYS TD PT72
1.0000 | MEDICATED_PATCH | TRANSDERMAL | Status: DC
  Administered 2018-01-13: 1.5 mg via TRANSDERMAL
  Filled 2018-01-13: qty 1

## 2018-01-13 MED ORDER — ATROPINE SULFATE 1 % OP SOLN
2.0000 [drp] | OPHTHALMIC | Status: DC | PRN
  Administered 2018-01-14: 2 [drp] via SUBLINGUAL
  Filled 2018-01-13: qty 2

## 2018-01-13 MED ORDER — GLYCOPYRROLATE 0.2 MG/ML IJ SOLN
0.1000 mg | Freq: Three times a day (TID) | INTRAMUSCULAR | Status: DC | PRN
  Administered 2018-01-13: 0.1 mg via INTRAVENOUS
  Filled 2018-01-13: qty 1

## 2018-01-13 NOTE — Progress Notes (Signed)
Family Medicine Teaching Service Daily Progress Note Intern Pager: [phone]  Patient name: Jennifer [name] Medical record number: [id number] Date of birth: 05/18/1935 Age: 82 y.o. Gender: female  Primary Care Provider: Janie Morning, DO Consultants: Palliative  Code Status: DNR/DNI, Comfort care   Assessment and Plan: Jennifer [name] is a 82 y.o. female who presented with non-operatable bowel perforation. Currently comfort care.   #Comfort Care Anticipated hospital death.  Appreciate palliative recommendations  ? Dilaudid gtt, oxycodone 10mg  sublingual q4 PRN breakthrough pain   ? Ativan 1mg  BID, q6 PRN  Patient no longer eating, no diet  Continued comfort measures  Chaplain available as needed   Disposition: Continued Comfort Care   Subjective:  Comfortable per family at bedside. With some mouth gurgling.   Objective: Temp:  [98.3 F (36.8 C)] 98.3 F (36.8 C) (11/30 0505) Pulse Rate:  [78] 78 (11/30 0505) Resp:  [14] 14 (11/30 0505) BP: (97)/(71) 97/71 (11/30 0505) SpO2:  [78 %] 78 % (11/30 0505) Physical Exam: General: Alert, NAD, elderly female  HEENT: NMM dry, gurgling with breathing  Cardiac: Faint heart sounds RRR no m/g/r Lungs: Clear bilaterally, no increased WOB  Abdomen: soft, non-tender, distended, no-hypoactive BS  Ext: Warm, dry, palpable distal pulses    Laboratory: No results for input(s): WBC, HGB, HCT, PLT in the last 168 hours.   Imaging/Diagnostic Tests: No results found.  Patriciaann Clan, DO 01/13/2018, 8:13 AM PGY-1, [place] Intern pager: [phone], text pages welcome

## 2018-01-13 NOTE — Progress Notes (Signed)
Palliative Medicine RN Note: Daily symptom check.  Daughter and granddaughter are in the room. Patient is on hydromorphone continuous SQ infusion. She is relaxed and having some apnea; longest during my visit was 6 seconds. PAINAD is 0.  She is having some terminal secretions. Obtained rx for scopolamine patch and atropine SL & discussed their use with family and RN. She is also having saliva pooling in her cheek (head is tilted to one side); family and staff are cleaning this/suctioning as needed.   Daughter reports "that doctor" who rounded this am told her that death could happen "any minute" and to call the family to the bedside. She has called many family members, and as I spoke with her, she told me that the MD told her prognosis could be "any minute to a few days." We discussed the difficulty of giving a specific time frame; I fear that her daughter is clinging to any prognosis as hard and fast. Tacha granddaughter reports that she was told her other grandmother had two weeks, then she died 2 hours later.   69, Alexismarie's daughter, is overwhelmed and emotional. She does not remember that I visited for a long time yesterday and forgot that I am the person who discussed the hydromorphone infusion. She expressed many times how grateful she is for God's blessings and how thankful she is that she "got clean" before her mother got sick. Sherri will need ongoing reinforcement of all teaching.  PMT will follow for symptoms tomorrow.  Marjie Skiff Shihab States, RN, BSN, [facility] Palliative Medicine Team 01/13/2018 11:15 AM Office [phone]

## 2018-01-13 NOTE — Progress Notes (Signed)
   01/13/18 1500  Clinical Encounter Type  Visited With Patient and family together;Family;Health care provider  Visit Type Follow-up;Spiritual support;Psychological support;Social support;Patient actively dying  Referral From Nurse  Spiritual Encounters  Spiritual Needs Emotional;Grief support  Stress Factors  Family Stress Factors Major life changes;Family relationships;Exhausted   F/u w/ family at req of Therapist, sports.  Pt is EOL.  Spoke to pt when arrived and before left.  Met w/ pt's 2 sons and daughter, plus daughter's husband and one of daughter's daughters.  Most of time w/ pt's eldest son and pt's daughter.  Compassionate presence, empathetic listening, family talking through experience, concerns, family situations.  Per family report, many family members have told pt it is fine to pass.  One granddaughter or great-granddaughter expected to come tomorrow--she has CP and transport was a concern w/ the weather today.  Chaplain let daughter know that sometimes a person waits until she is alone to pass.  Chaplain remains available to return.  Over weekend, page may be best option.  Myra Gianotti resident, [phone]

## 2018-01-14 NOTE — Discharge Summary (Signed)
[facility] Death Summary  Patient name: Jennifer [name] Medical record number: [id number] Date of birth: [birth year] Age: 82 y.o. Gender: female Date of Admission: Feb 01, 2018  Deceased: 02/10/18 Admitting Physician: Martyn Malay, MD  Primary Care Provider: Janie Morning, DO Consultants: palliative  Indication for Hospitalization: Sepsis 2/2 Small bowel perforation   Discharge Diagnoses/Problem List:  End of life comfort care  Sepsis 2/2 Small bowel perforation AKI on CKD  Obesity Myasthenia gravis  Hypothyroidism/hypoparathyroidism Dementia  HFpEF     Disposition: Comfort Care, Deceased    Discharge Condition: Deceased 6:15am, 02/10/2018   Discharge Exam:  Cardiac: No heart rate, no palpated peripheral pulses Lungs: No breathing   Brief Hospital Course:  Jennifer [name] was an 82 year old female presented from her skilled nursing facility with abdominal pain that was ultimately found to be in septic shock secondary to a small bowel perforation noted on CT abdomen. After extensive family discussion with CCM, surgery, and our team, the family elected to pursue comfort care given high likelihood she would not survive surgical intervention. They ultimately decided on an anticipated hospital death. Palliative care and chaplain continued to be present during her stay to ensure her and her family's comfort. Routine comfort care measures were provided, and Jennifer [name] passed away peacefully the morning of 02-11-2023 with her family at bedside.    Significant Procedures: None   Ct Abdomen Pelvis Wo Contrast  Result Date: 02-01-2018 CLINICAL DATA:  Intraperitoneal free air on radiograph. Concern for bowel perforation. Abdominal pain EXAM: CT ABDOMEN AND PELVIS WITHOUT CONTRAST TECHNIQUE: Multidetector CT imaging of the abdomen and pelvis was performed following the standard protocol without IV contrast. COMPARISON:  Radiograph 02/01/18, CT 599 FINDINGS: Lower chest:  Small bilateral pleural effusions. No infiltrate. Mild basilar atelectasis. Hepatobiliary: No focal abnormality on noncontrast exam. Postcholecystectomy. Pancreas: Pancreas is normal. No ductal dilatation. No pancreatic inflammation. Spleen: Normal spleen Adrenals/urinary tract: Adrenal glands are normal. Post RIGHT nephrectomy. No hydronephrosis of the LEFT kidney. LEFT ureter and bladder normal. Stomach/Bowel: Large volume intraperitoneal free air collecting non dependently within the peritoneal space and extending beneath the hemidiaphragms and extending along the LEFT pericolic gutter and splenic flexure of the colon. There is no clear perforation site identified however the predominance of gas surrounding distal transverse colon to the splenic flexure is concerning for site of perforation in the transverse or LEFT colon. Gas around the LEFT pararenal space also suggesting a LEFT colon rupture. Oral contrast was administered. There is no evidence of leak of the oral contrast. No fluid collections in the abdomen pelvis. Esophagus and stomach appear normal. Duodenum normal. Small bowel normal caliber. Contrast flows in the distal small bowel. Appendix and cecum normal. Ascending colon normal. Transverse colon is difficult to follow as it is surrounded extraluminal gas colon is relatively collapsed. There are diverticula of the descending colon. No acute inflammation identified. Diverticula sigmoid colon rectum. Vascular/Lymphatic: Abdominal aorta is normal caliber with atherosclerotic calcification. There is no retroperitoneal or periportal lymphadenopathy. No pelvic lymphadenopathy. Reproductive: Uterus normal. Calcified leiomyoma extending from uterine body. Ovaries normal Other: Large volume intraperitoneal free air described above. Musculoskeletal: A thickened trabecula and lytic process in the sacrum is not changed from prior. Findings suggest chronic infection or inflammation such as Paget's disease.  IMPRESSION: 1. Large volume intraperitoneal free air consistent with perforated bowel. Favor transverse colon or LEFT colon as source of perforation as the gas is concentrated along this portion the colon however there is extensive  gas throughout the abdomen which makes localization difficult. 2. Diverticulosis of the LEFT colon without clear evidence diverticulitis. 3. No spillage of oral contrast proximally to suggests duodenal rupture. No intraperitoneal free fluid. 4. Normal  solitary LEFT kidney on noncontrast exam. Critical Value/emergent results were called by telephone at the time of interpretation on 01/12/2018 at 12:00 pm to Dr. Rodell Perna , who verbally acknowledged these results. Electronically Signed   By: Suzy Bouchard M.D.   On: 12/20/2017 12:03   Dg Abdomen Acute W/chest  Result Date: 01/11/2018 CLINICAL DATA:  Sharp left abdominal pain EXAM: DG ABDOMEN ACUTE W/ 1V CHEST COMPARISON:  CT 06/22/2017 and previous FINDINGS: Heart size upper limits normal.  Ectatic thoracic aorta. Patchy airspace opacity in the left lower lung. No pleural effusion. No pneumothorax. Moderate amount of free intraperitoneal gas. Stomach, small bowel, and colon are nondilated. Cholecystectomy clips.  Calcified uterine fibroid. Spondylitic changes in the lower thoracic and lumbar spine. DJD in bilateral hips. IMPRESSION: 1. Moderate free intraperitoneal gas consistent with bowel perforation. Critical Value/emergent results were called by telephone at the time of interpretation on 01/12/2018 at 9:53 am to Dr. Ronnald Nian, who verbally acknowledged these results. 2. Focal airspace opacity in the left lower lung, possibly pneumonia. Recommend attention on follow-up to confirm appropriate resolution. Electronically Signed   By: Lucrezia Europe M.D.   On: 12/24/2017 09:54     Patriciaann Clan, DO 03-Feb-2018, 1:48 PM PGY-1, [place]

## 2018-01-14 DEATH — deceased

## 2018-01-16 ENCOUNTER — Telehealth: Payer: Self-pay

## 2018-01-16 NOTE — Telephone Encounter (Signed)
[employer] and son funeral home called about the death certificate for this pt.  Funeral Home was told Dr.Eniola is the Dr that will handle this for the pt. Please call the funeral home @ [phone] and speak with Fritz Pickerel. Ottis Stain, CMA

## 2018-01-16 NOTE — Telephone Encounter (Signed)
I called that number three times. No response and I left a HIPAA compliant call back message.

## 2018-01-17 ENCOUNTER — Ambulatory Visit (HOSPITAL_COMMUNITY): Payer: Medicare Other

## 2018-02-14 NOTE — Progress Notes (Signed)
Wasted approximately 64.1 ml of dilaudid drip at [facility] witnessed by Dian Queen, RN

## 2018-02-14 NOTE — Progress Notes (Signed)
At 615 AM, patient not breathing, pulseless. Verified by another nurse. Informed on call MD Patient expired with daughter at bedside.

## 2018-02-14 NOTE — Progress Notes (Signed)
Resident informed of patient's demise. Her daughter at her bedside.  On exam the patient did not respond to verbal or physical stimuli. Absent heart and breath sounds Absent peripheral pulses.   I gave her daughter emotional support as much as I can.  Resident to complete death eval and documentation.

## 2018-02-14 NOTE — Progress Notes (Addendum)
   01/26/18 0900  Clinical Encounter Type  Visited With Family  Visit Type Death  Spiritual Encounters  Spiritual Needs Prayer;Grief support  Follow-up per nurse request for more prayer and support to family. Several family members at bedside. Read sacred text, provided emotional and grief support. Gave daughter placement card. She said that family did not want patient to go to morgue. I told her to contact funeral home and they will respond and come get  their love one. Daughter said they will do that immediately. Family wish to stay with patient until funeral home arrive if possible. Will return at family request.

## 2018-02-14 DEATH — deceased

## 2018-04-11 ENCOUNTER — Ambulatory Visit: Payer: Medicare Other | Admitting: Neurology
